# Patient Record
Sex: Female | Born: 1950 | Race: White | Hispanic: No | Smoking: Former smoker
Health system: Southern US, Community
[De-identification: ages and names within clinical notes are randomized; demographics above are authoritative.]

## PROBLEM LIST (undated history)

## (undated) DIAGNOSIS — F17200 Nicotine dependence, unspecified, uncomplicated: Secondary | ICD-10-CM

## (undated) DIAGNOSIS — F410 Panic disorder [episodic paroxysmal anxiety] without agoraphobia: Secondary | ICD-10-CM

## (undated) DIAGNOSIS — Z923 Personal history of irradiation: Secondary | ICD-10-CM

## (undated) DIAGNOSIS — Z9181 History of falling: Secondary | ICD-10-CM

## (undated) DIAGNOSIS — I10 Essential (primary) hypertension: Secondary | ICD-10-CM

## (undated) DIAGNOSIS — F329 Major depressive disorder, single episode, unspecified: Secondary | ICD-10-CM

## (undated) DIAGNOSIS — F32A Depression, unspecified: Secondary | ICD-10-CM

## (undated) DIAGNOSIS — C3491 Malignant neoplasm of unspecified part of right bronchus or lung: Secondary | ICD-10-CM

## (undated) DIAGNOSIS — F1010 Alcohol abuse, uncomplicated: Secondary | ICD-10-CM

## (undated) HISTORY — PX: TONSILLECTOMY: SUR1361

## (undated) HISTORY — PX: BACK SURGERY: SHX140

## (undated) HISTORY — DX: Malignant neoplasm of unspecified part of right bronchus or lung: C34.91

---

## 2011-05-06 ENCOUNTER — Other Ambulatory Visit: Payer: Self-pay | Admitting: Family Medicine

## 2011-05-17 ENCOUNTER — Other Ambulatory Visit: Payer: Self-pay

## 2011-08-06 ENCOUNTER — Other Ambulatory Visit: Payer: Self-pay | Admitting: Family Medicine

## 2011-08-09 ENCOUNTER — Ambulatory Visit
Admission: RE | Admit: 2011-08-09 | Discharge: 2011-08-09 | Disposition: A | Discharge status: Home / Self Care | Payer: BC Managed Care – PPO | Source: Ambulatory Visit | Attending: Family Medicine | Admitting: Family Medicine

## 2013-06-21 ENCOUNTER — Inpatient Hospital Stay (HOSPITAL_COMMUNITY)
Admission: EM | Admit: 2013-06-21 | Discharge: 2013-06-29 | DRG: 480 | Disposition: A | Discharge status: Rehabilitation Facility | Attending: Internal Medicine | Admitting: Internal Medicine

## 2013-06-21 ENCOUNTER — Emergency Department (HOSPITAL_COMMUNITY)

## 2013-06-21 ENCOUNTER — Encounter (HOSPITAL_COMMUNITY): Payer: Self-pay | Admitting: Emergency Medicine

## 2013-06-21 ENCOUNTER — Inpatient Hospital Stay (HOSPITAL_COMMUNITY)

## 2013-06-21 DIAGNOSIS — F17200 Nicotine dependence, unspecified, uncomplicated: Secondary | ICD-10-CM | POA: Diagnosis present

## 2013-06-21 DIAGNOSIS — J4489 Other specified chronic obstructive pulmonary disease: Secondary | ICD-10-CM | POA: Diagnosis present

## 2013-06-21 DIAGNOSIS — I4892 Unspecified atrial flutter: Secondary | ICD-10-CM | POA: Diagnosis present

## 2013-06-21 DIAGNOSIS — M810 Age-related osteoporosis without current pathological fracture: Secondary | ICD-10-CM | POA: Diagnosis present

## 2013-06-21 DIAGNOSIS — R222 Localized swelling, mass and lump, trunk: Secondary | ICD-10-CM | POA: Diagnosis present

## 2013-06-21 DIAGNOSIS — F1010 Alcohol abuse, uncomplicated: Secondary | ICD-10-CM | POA: Diagnosis present

## 2013-06-21 DIAGNOSIS — Z7982 Long term (current) use of aspirin: Secondary | ICD-10-CM | POA: Diagnosis not present

## 2013-06-21 DIAGNOSIS — E46 Unspecified protein-calorie malnutrition: Secondary | ICD-10-CM | POA: Diagnosis present

## 2013-06-21 DIAGNOSIS — G9340 Encephalopathy, unspecified: Secondary | ICD-10-CM | POA: Diagnosis present

## 2013-06-21 DIAGNOSIS — S72141A Displaced intertrochanteric fracture of right femur, initial encounter for closed fracture: Secondary | ICD-10-CM | POA: Diagnosis present

## 2013-06-21 DIAGNOSIS — I480 Paroxysmal atrial fibrillation: Secondary | ICD-10-CM | POA: Diagnosis not present

## 2013-06-21 DIAGNOSIS — F411 Generalized anxiety disorder: Secondary | ICD-10-CM | POA: Diagnosis present

## 2013-06-21 DIAGNOSIS — S72001A Fracture of unspecified part of neck of right femur, initial encounter for closed fracture: Secondary | ICD-10-CM

## 2013-06-21 DIAGNOSIS — C3491 Malignant neoplasm of unspecified part of right bronchus or lung: Secondary | ICD-10-CM | POA: Diagnosis present

## 2013-06-21 DIAGNOSIS — S72143A Displaced intertrochanteric fracture of unspecified femur, initial encounter for closed fracture: Secondary | ICD-10-CM

## 2013-06-21 DIAGNOSIS — I4891 Unspecified atrial fibrillation: Secondary | ICD-10-CM | POA: Diagnosis present

## 2013-06-21 DIAGNOSIS — I10 Essential (primary) hypertension: Secondary | ICD-10-CM | POA: Diagnosis present

## 2013-06-21 DIAGNOSIS — Z87891 Personal history of nicotine dependence: Secondary | ICD-10-CM | POA: Insufficient documentation

## 2013-06-21 DIAGNOSIS — Z79899 Other long term (current) drug therapy: Secondary | ICD-10-CM

## 2013-06-21 DIAGNOSIS — R918 Other nonspecific abnormal finding of lung field: Secondary | ICD-10-CM

## 2013-06-21 DIAGNOSIS — S72009A Fracture of unspecified part of neck of unspecified femur, initial encounter for closed fracture: Secondary | ICD-10-CM

## 2013-06-21 DIAGNOSIS — N179 Acute kidney failure, unspecified: Secondary | ICD-10-CM | POA: Diagnosis present

## 2013-06-21 DIAGNOSIS — J449 Chronic obstructive pulmonary disease, unspecified: Secondary | ICD-10-CM | POA: Diagnosis present

## 2013-06-21 DIAGNOSIS — R599 Enlarged lymph nodes, unspecified: Secondary | ICD-10-CM | POA: Diagnosis present

## 2013-06-21 DIAGNOSIS — R590 Localized enlarged lymph nodes: Secondary | ICD-10-CM

## 2013-06-21 DIAGNOSIS — Z5189 Encounter for other specified aftercare: Secondary | ICD-10-CM | POA: Diagnosis not present

## 2013-06-21 DIAGNOSIS — D649 Anemia, unspecified: Secondary | ICD-10-CM | POA: Diagnosis present

## 2013-06-21 DIAGNOSIS — W19XXXA Unspecified fall, initial encounter: Secondary | ICD-10-CM | POA: Diagnosis present

## 2013-06-21 DIAGNOSIS — F319 Bipolar disorder, unspecified: Secondary | ICD-10-CM

## 2013-06-21 DIAGNOSIS — C3492 Malignant neoplasm of unspecified part of left bronchus or lung: Secondary | ICD-10-CM

## 2013-06-21 HISTORY — DX: Panic disorder (episodic paroxysmal anxiety): F41.0

## 2013-06-21 HISTORY — DX: Major depressive disorder, single episode, unspecified: F32.9

## 2013-06-21 HISTORY — DX: Nicotine dependence, unspecified, uncomplicated: F17.200

## 2013-06-21 HISTORY — DX: Alcohol abuse, uncomplicated: F10.10

## 2013-06-21 HISTORY — DX: Depression, unspecified: F32.A

## 2013-06-21 HISTORY — DX: Essential (primary) hypertension: I10

## 2013-06-21 LAB — CBC WITH DIFFERENTIAL/PLATELET
BASOS ABS: 0 10*3/uL (ref 0.0–0.1)
BASOS PCT: 0 % (ref 0–1)
Eosinophils Absolute: 0 10*3/uL (ref 0.0–0.7)
Eosinophils Relative: 0 % (ref 0–5)
HCT: 34.4 % — ABNORMAL LOW (ref 36.0–46.0)
Hemoglobin: 11.7 g/dL — ABNORMAL LOW (ref 12.0–15.0)
LYMPHS PCT: 13 % (ref 12–46)
Lymphs Abs: 1.9 10*3/uL (ref 0.7–4.0)
MCH: 33.6 pg (ref 26.0–34.0)
MCHC: 34 g/dL (ref 30.0–36.0)
MCV: 98.9 fL (ref 78.0–100.0)
MONO ABS: 1.7 10*3/uL — AB (ref 0.1–1.0)
Monocytes Relative: 12 % (ref 3–12)
NEUTROS ABS: 11 10*3/uL — AB (ref 1.7–7.7)
NEUTROS PCT: 75 % (ref 43–77)
Platelets: 310 10*3/uL (ref 150–400)
RBC: 3.48 MIL/uL — ABNORMAL LOW (ref 3.87–5.11)
RDW: 13.8 % (ref 11.5–15.5)
WBC: 14.7 10*3/uL — AB (ref 4.0–10.5)

## 2013-06-21 LAB — URINALYSIS, ROUTINE W REFLEX MICROSCOPIC
GLUCOSE, UA: NEGATIVE mg/dL
Hgb urine dipstick: NEGATIVE
Ketones, ur: 15 mg/dL — AB
Leukocytes, UA: NEGATIVE
Nitrite: NEGATIVE
Protein, ur: NEGATIVE mg/dL
Specific Gravity, Urine: 1.021 (ref 1.005–1.030)
Urobilinogen, UA: 1 mg/dL (ref 0.0–1.0)
pH: 5.5 (ref 5.0–8.0)

## 2013-06-21 LAB — BASIC METABOLIC PANEL
BUN: 36 mg/dL — ABNORMAL HIGH (ref 6–23)
CALCIUM: 9.6 mg/dL (ref 8.4–10.5)
CO2: 31 mEq/L (ref 19–32)
Chloride: 90 mEq/L — ABNORMAL LOW (ref 96–112)
Creatinine, Ser: 0.63 mg/dL (ref 0.50–1.10)
GFR calc non Af Amer: >90 mL/min (ref >90)
Glucose, Bld: 101 mg/dL — ABNORMAL HIGH (ref 70–99)
Potassium: 3.8 mEq/L (ref 3.7–5.3)
Sodium: 135 mEq/L — ABNORMAL LOW (ref 137–147)

## 2013-06-21 LAB — PROTIME-INR
INR: 0.93 (ref 0.00–1.49)
PROTHROMBIN TIME: 12.3 s (ref 11.6–15.2)

## 2013-06-21 LAB — TYPE AND SCREEN
ABO/RH(D): B POS
Antibody Screen: NEGATIVE

## 2013-06-21 LAB — ABO/RH: ABO/RH(D): B POS

## 2013-06-21 MED ORDER — DEXTROSE-NACL 5-0.45 % IV SOLN
100.0000 mL/h | INTRAVENOUS | Status: DC
  Administered 2013-06-21: 100 mL/h via INTRAVENOUS

## 2013-06-21 MED ORDER — ALPRAZOLAM 0.5 MG PO TABS: 0.5000 mg | ORAL_TABLET | Freq: Two times a day (BID) | ORAL | Status: DC | PRN

## 2013-06-21 MED ORDER — LORAZEPAM 2 MG/ML IJ SOLN
1.0000 mg | Freq: Four times a day (QID) | INTRAMUSCULAR | Status: AC | PRN
  Administered 2013-06-22 – 2013-06-23 (×2): 1 mg via INTRAVENOUS
  Filled 2013-06-21 (×2): qty 1

## 2013-06-21 MED ORDER — HYDROCODONE-ACETAMINOPHEN 5-325 MG PO TABS
1.0000 | ORAL_TABLET | Freq: Four times a day (QID) | ORAL | Status: DC | PRN
  Administered 2013-06-21 – 2013-06-26 (×7): 2 via ORAL
  Administered 2013-06-26 – 2013-06-27 (×3): 1 via ORAL
  Administered 2013-06-27 – 2013-06-28 (×2): 2 via ORAL
  Administered 2013-06-28: 1 via ORAL
  Administered 2013-06-29: 2 via ORAL
  Filled 2013-06-21 (×5): qty 2
  Filled 2013-06-21: qty 1
  Filled 2013-06-21 (×2): qty 2
  Filled 2013-06-21: qty 1
  Filled 2013-06-21: qty 2
  Filled 2013-06-21: qty 1
  Filled 2013-06-21 (×3): qty 2

## 2013-06-21 MED ORDER — LORAZEPAM 1 MG PO TABS: 1.0000 mg | ORAL_TABLET | Freq: Four times a day (QID) | ORAL | Status: AC | PRN

## 2013-06-21 MED ORDER — ACETAMINOPHEN 500 MG PO TABS: 1000.0000 mg | ORAL_TABLET | Freq: Once | ORAL | Status: DC

## 2013-06-21 MED ORDER — MORPHINE SULFATE 4 MG/ML IJ SOLN
4.0000 mg | Freq: Once | INTRAMUSCULAR | Status: AC
  Administered 2013-06-21: 4 mg via INTRAVENOUS
  Filled 2013-06-21: qty 1

## 2013-06-21 MED ORDER — SODIUM CHLORIDE 0.9 % IV SOLN
Freq: Once | INTRAVENOUS | Status: AC
  Administered 2013-06-21: 15:00:00 via INTRAVENOUS

## 2013-06-21 MED ORDER — THIAMINE HCL 100 MG/ML IJ SOLN
100.0000 mg | Freq: Every day | INTRAMUSCULAR | Status: DC
  Administered 2013-06-23: 100 mg via INTRAVENOUS
  Filled 2013-06-21 (×4): qty 1

## 2013-06-21 MED ORDER — SERTRALINE HCL 100 MG PO TABS
100.0000 mg | ORAL_TABLET | Freq: Every day | ORAL | Status: DC
  Administered 2013-06-23 – 2013-06-29 (×7): 100 mg via ORAL
  Filled 2013-06-21 (×8): qty 1

## 2013-06-21 MED ORDER — MORPHINE SULFATE 2 MG/ML IJ SOLN
0.5000 mg | INTRAMUSCULAR | Status: DC | PRN
  Administered 2013-06-22: 0.5 mg via INTRAVENOUS
  Filled 2013-06-21: qty 1

## 2013-06-21 MED ORDER — FOLIC ACID 1 MG PO TABS
1.0000 mg | ORAL_TABLET | Freq: Every day | ORAL | Status: DC
  Administered 2013-06-22 – 2013-06-29 (×8): 1 mg via ORAL
  Filled 2013-06-21 (×8): qty 1

## 2013-06-21 MED ORDER — CHLORHEXIDINE GLUCONATE 4 % EX LIQD
60.0000 mL | Freq: Once | CUTANEOUS | Status: DC
  Filled 2013-06-21: qty 60

## 2013-06-21 MED ORDER — ADULT MULTIVITAMIN W/MINERALS CH
1.0000 | ORAL_TABLET | Freq: Every day | ORAL | Status: DC
  Administered 2013-06-22 – 2013-06-29 (×8): 1 via ORAL
  Filled 2013-06-21 (×8): qty 1

## 2013-06-21 MED ORDER — ADULT MULTIVITAMIN W/MINERALS CH: 1.0000 | ORAL_TABLET | Freq: Every day | ORAL | Status: DC

## 2013-06-21 MED ORDER — ENOXAPARIN SODIUM 40 MG/0.4ML ~~LOC~~ SOLN
40.0000 mg | SUBCUTANEOUS | Status: DC
  Administered 2013-06-22 – 2013-06-26 (×5): 40 mg via SUBCUTANEOUS
  Filled 2013-06-21 (×7): qty 0.4

## 2013-06-21 MED ORDER — VITAMIN B-1 100 MG PO TABS
100.0000 mg | ORAL_TABLET | Freq: Every day | ORAL | Status: DC
  Administered 2013-06-22 – 2013-06-29 (×7): 100 mg via ORAL
  Filled 2013-06-21 (×8): qty 1

## 2013-06-21 MED ORDER — CLINDAMYCIN PHOSPHATE 900 MG/50ML IV SOLN
900.0000 mg | INTRAVENOUS | Status: DC
  Filled 2013-06-21: qty 50

## 2013-06-21 MED ORDER — METOPROLOL SUCCINATE ER 100 MG PO TB24
100.0000 mg | ORAL_TABLET | Freq: Every day | ORAL | Status: DC
  Filled 2013-06-21 (×3): qty 1

## 2013-06-21 NOTE — Consult Note (Signed)
   ORTHOPAEDIC CONSULTATION  REQUESTING PHYSICIAN: Jai-Gurmukh Samtani, MD  Chief Complaint: R hip pain  HPI: Stephanie Oliver is a 63 y.o. female who complains of  Mechanical fall onto her R side  Past Medical History  Diagnosis Date  . Depression   . Panic attacks   . Hypertension   . Smoker   . Alcohol abuse    History reviewed. No pertinent past surgical history. History   Social History  . Marital Status: Widowed    Spouse Name: N/A    Number of Children: N/A  . Years of Education: N/A   Social History Main Topics  . Smoking status: Current Every Day Smoker -- 1.50 packs/day    Types: Cigarettes  . Smokeless tobacco: None  . Alcohol Use: Yes  . Drug Use: No  . Sexual Activity: None   Other Topics Concern  . None   Social History Narrative   Heavy smoker till 06/17/13, smoked from age 18 32642 packs per day   Current moderate to heavy drinker, drinks 3-4 beers daily, if she cannot get the beer she drinks 3-4 shots of whiskey daily.                                           Lives currently with her daughter and 13-month-old granddaughter at home   Next of kin = [name] phone number [phone]   Used to work until the age of 61 and then went into early retirement   Previously has had suicidal attempt   Has tried to pace herself in inpatient alcohol detox when she slipped in [place].   Family History  Problem Relation Age of Onset  . CAD Father   . Arthritis Mother    Allergies  Allergen Reactions  . Lisinopril Diarrhea and Nausea Only  . Paxil [Paroxetine] Diarrhea and Nausea Only  . Penicillins Hives   Prior to Admission medications   Medication Sig Start Date End Date Taking? Authorizing Provider  ALPRAZolam (XANAX) 0.5 MG tablet Take 0.5 mg by mouth 2 (two) times daily as needed for anxiety.  06/14/13  Yes Historical Provider, MD  aspirin 325 MG tablet Take 325 mg by mouth daily.   Yes Historical Provider, MD  Cholecalciferol (VITAMIN D-3 PO) Take 1 tablet  by mouth daily.   Yes Historical Provider, MD  metoprolol succinate (TOPROL-XL) 100 MG 24 hr tablet Take 100 mg by mouth daily. 05/04/13  Yes Historical Provider, MD  Multiple Vitamin (MULTIVITAMIN WITH MINERALS) TABS tablet Take 1 tablet by mouth daily.   Yes Historical Provider, MD  naproxen sodium (ANAPROX) 220 MG tablet Take 220 mg by mouth 2 (two) times daily with a meal.   Yes Historical Provider, MD  sertraline (ZOLOFT) 100 MG tablet Take 100 mg by mouth daily. 05/04/13  Yes Historical Provider, MD   Dg Hip Complete Right  06/21/2013   CLINICAL DATA:  Right hip pain secondary to a fall 2 days ago.  EXAM: RIGHT HIP - COMPLETE 2+ VIEW  COMPARISON:  None.  FINDINGS: There is an angulated comminuted slightly impacted intertrochanteric fracture of the proximal right femur. Lesser trochanter is displaced. 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  Pelvic bones are intact.  IMPRESSION: Comminuted intertrochanteric fracture of the proximal right femur.   Electronically Signed   By: Jim  Maxwell M.D.   On: 06/21/2013 11:26     Ct Head Wo Contrast  06/21/2013   CLINICAL DATA:  History of trauma from a fall. Right leg pain and weakness.  EXAM: CT HEAD WITHOUT CONTRAST  TECHNIQUE: Contiguous axial images were obtained from the base of the skull through the vertex without intravenous contrast.  COMPARISON:  No priors.  FINDINGS: Patchy and confluent areas of decreased attenuation are noted throughout the deep and periventricular white matter of the cerebral hemispheres bilaterally, compatible with chronic microvascular ischemic disease. No acute displaced skull fractures are identified. No acute intracranial abnormality. Specifically, no evidence of acute post-traumatic intracranial hemorrhage, no definite regions of acute/subacute cerebral ischemia, no focal mass, mass effect, hydrocephalus or abnormal intra or extra-axial fluid collections. The visualized paranasal sinuses and mastoids are well  pneumatized.  IMPRESSION: 1. No acute displaced skull fracture or findings to suggest acute intracranial trauma. 2. Chronic microvascular ischemic changes in the cerebral white matter, as above.   Electronically Signed   By: Daniel  Entrikin M.D.   On: 06/21/2013 12:11   Dg Chest Port 1 View  06/21/2013   CLINICAL DATA:  Preop for hip fracture.  Slight cough.  EXAM: PORTABLE CHEST - 1 VIEW  COMPARISON:  None.  FINDINGS: The heart size is normal. A right peritracheal mass measures 5.3 x 7.0 cm. Other smaller right-sided nodules are evident. A 2 cm left pulmonary nodule is suggested. There is no edema or effusion to suggest failure. The right hemidiaphragm is elevated.  No acute focal airspace disease is evident.  IMPRESSION: 1. 7 cm right peritracheal mass. Recommend CT of the chest with contrast for further evaluation. 2. Additional smaller nodules are suggested, including a 2 cm left lower lobe nodule.   Electronically Signed   By: Chris  Mattern M.D.   On: 06/21/2013 15:10    Positive ROS: All other systems have been reviewed and were otherwise negative with the exception of those mentioned in the HPI and as above.  Labs cbc  Recent Labs  06/21/13 1136  WBC 14.7*  HGB 11.7*  HCT 34.4*  PLT 310    Labs inflam No results found for this basename: ESR, CRP,  in the last 72 hours  Labs coag  Recent Labs  06/21/13 1136  INR 0.93     Recent Labs  06/21/13 1136  NA 135*  K 3.8  CL 90*  CO2 31  GLUCOSE 101*  BUN 36*  CREATININE 0.63  CALCIUM 9.6    Physical Exam: Filed Vitals:   06/21/13 1345  BP: 104/65  Pulse: 77  Temp:   Resp:    General: Alert, no acute distress Cardiovascular: No pedal edema Respiratory: No cyanosis, no use of accessory musculature GI: No organomegaly, abdomen is soft and non-tender Skin: No lesions in the area of chief complaint Neurologic: Sensation intact distally Psychiatric: Patient is competent for consent with normal mood and  affect Lymphatic: No axillary or cervical lymphadenopathy  MUSCULOSKELETAL:  Pain with log roll, skin benign, distally NVI Other extremities are atraumatic with painless ROM and NVI.  Assessment: Right intertroch hip fracture  Plan: IM nail in OR on 1/6 pending clearance by Hospitalist service Weight Bearing Status: Bedrest for now, will adjust likely to WBAT post op PT VTE px: SCD's and [employer] today, will restart aspirin post op  New finding of a lung mass on Xray and CT pulmonary involved. Have discussed with Anesthesia and we feel that the benefits of surgery outweigh the risk as does the family and patient   Blakleigh Straw,   Anner Baity, D, MD Cell [phone]   06/21/2013 3:40 PM     

## 2013-06-21 NOTE — ED Provider Notes (Signed)
CSN: [id number]     Arrival date & time 06/21/13  1010 History   First MD Initiated Contact with Patient 06/21/13 1111     Chief Complaint  Patient presents with  . Leg Pain  . Leg Swelling   (Consider location/radiation/quality/duration/timing/severity/associated sxs/prior Treatment) Patient is a 63 y.o. female presenting with leg pain.  Leg Pain Location:  Hip Time since incident:  2 days Injury: yes   Mechanism of injury: fall   Fall:    Fall occurred: pulled down when her dog jerked the leash. Pain details:    Quality:  Sharp   Severity:  Moderate   Onset quality:  Sudden   Timing:  Constant   Subjective pain progression: worse with movement.  unable to bear weight. Worsened by:  Bearing weight Associated symptoms: no fever   Associated symptoms comment:  Right leg swelling    Past Medical History  Diagnosis Date  . Depression   . Panic attacks    History reviewed. No pertinent past surgical history. No family history on file. History  Substance Use Topics  . Smoking status: Current Every Day Smoker -- 1.50 packs/day    Types: Cigarettes  . Smokeless tobacco: Not on file  . Alcohol Use: Yes   OB History   Grav Para Term Preterm Abortions TAB SAB Ect Mult Living                 Review of Systems  Constitutional: Negative for fever.  HENT: Negative for congestion.   Respiratory: Negative for cough and shortness of breath.   Cardiovascular: Negative for chest pain.  Gastrointestinal: Negative for nausea, vomiting, abdominal pain and diarrhea.  All other systems reviewed and are negative.    Allergies  Penicillins  Home Medications   Current Outpatient Rx  Name  Route  Sig  Dispense  Refill  . ALPRAZolam (XANAX) 0.5 MG tablet   Oral   Take 0.5 mg by mouth 2 (two) times daily.         . metoprolol succinate (TOPROL-XL) 100 MG 24 hr tablet   Oral   Take 100 mg by mouth daily.         . sertraline (ZOLOFT) 100 MG tablet   Oral   Take 100 mg by  mouth daily.          BP 115/55  Pulse 80  Temp(Src) 97.5 F (36.4 C) (Oral)  Resp 16  SpO2 99% Physical Exam  Nursing note and vitals reviewed. Constitutional: She is oriented to person, place, and time. She appears well-developed and well-nourished. No distress.  HENT:  Head: Normocephalic and atraumatic.  Mouth/Throat: Oropharynx is clear and moist.  Eyes: Conjunctivae are normal. Pupils are equal, round, and reactive to light. No scleral icterus.  Neck: Neck supple.  Cardiovascular: Normal rate, regular rhythm, normal heart sounds and intact distal pulses.   No murmur heard. Pulmonary/Chest: Effort normal and breath sounds normal. No stridor. No respiratory distress. She has no rales.  Abdominal: Soft. Bowel sounds are normal. She exhibits no distension. There is no tenderness.  Musculoskeletal: Normal range of motion.  Neurological: She is alert and oriented to person, place, and time.  Skin: Skin is warm and dry. No rash noted.  Psychiatric: She has a normal mood and affect. Her behavior is normal.    ED Course  Procedures (including critical care time) Labs Review Labs Reviewed  BASIC METABOLIC PANEL - Abnormal; Notable for the following:    Sodium 135 (*)  Chloride 90 (*)    Glucose, Bld 101 (*)    BUN 36 (*)    All other components within normal limits  CBC WITH DIFFERENTIAL - Abnormal; Notable for the following:    WBC 14.7 (*)    RBC 3.48 (*)    Hemoglobin 11.7 (*)    HCT 34.4 (*)    Neutro Abs 11.0 (*)    Monocytes Absolute 1.7 (*)    All other components within normal limits  URINALYSIS, ROUTINE W REFLEX MICROSCOPIC - Abnormal; Notable for the following:    Bilirubin Urine SMALL (*)    Ketones, ur 15 (*)    All other components within normal limits  PROTIME-INR  TYPE AND SCREEN  ABO/RH   Imaging Review Dg Hip Complete Right  06/21/2013   CLINICAL DATA:  Right hip pain secondary to a fall 2 days ago.  EXAM: RIGHT HIP - COMPLETE 2+ VIEW   COMPARISON:  None.  FINDINGS: There is an angulated comminuted slightly impacted intertrochanteric fracture of the proximal right femur. Lesser trochanter is displaced. 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  Pelvic bones are intact.  IMPRESSION: Comminuted intertrochanteric fracture of the proximal right femur.   Electronically Signed   By: Rozetta Nunnery M.D.   On: 06/21/2013 11:26  Above radiology studies independently viewed by me.     EKG Interpretation   None       MDM   1. Intertrochanteric fracture of right hip   2. Lung mass    Right intertrochanteric hip fracture after a fall two days ago.  Dr. Verlon Au will admit.  Dr. Percell Miller (Orthopedics) plans OR possibly tomorrow.  During workup, a paratracheal mass was detected.  This was communicated to patient and CT scan was ordered, which Dr. Verlon Au will follow up.    She initially declined pain meds, but was subsequently treated with IV morphine.      Houston Siren, MD 06/21/13 [phone]

## 2013-06-21 NOTE — ED Notes (Signed)
Patient states was walking dog and the "dog took off and I fell behind him".  Patient states fell on R side.  Patient states 2 x days ago and has had increasing pain and swelling in R leg.    Patient unable to walk or put any weight on the leg at this time.

## 2013-06-21 NOTE — H&P (Signed)
[facility] History and Physical  Timarie Labell XLK:[id number] DOB: 07-22-1950 DOA: 06/21/2013  Referring physician: Doy Mince PCP: [name], MD  Specialists:  Bosie Clos will see patient in am-Will Operate 06/22/12  Chief Complaint: Right intertrochanteric hip fracture  HPI: Stephanie Oliver is a 63 y.o. female came to [facility] ed 06/21/2013 with   Fall/06/18/2013. She was in her yard walking her 2 dogs and one of the dogs sauce well. The dog ran away she did not have a good hold on the leash she fell backwards and hit her right hip posterolaterally. She states that she crawled back inside the home and could not bear weight on that limb subsequent to the fall. She has been nonambulatory and was able to get into her bed the night of the fall but has not been able to ambulate since. She has not been able to really go to the bathroom to urinate either. She states that although she's fallen before, she never experiences amount of pain with fall. Because of her poor ambulatory status and significant pain as well as inability to bear weight, she presented to the emergency room. EDP reports that patient seemed to be hallucinating before last but has not been drinking her usual amount of alcohol recently given  Emergency room workup = CT head no skull fracture, chronic microvascular disease UA = ketones 15, bilirubin small negative nitrites leukocytes Typed and screened Sodium 135, chloride of 90, BUN/creatinine 36/0.63 (no baseline) BC 14.7, hemoglobin 11.7, hematocrit 34.4, INR 0.93  EKG  = sinus rhythm, PR interval 0.12/0.16 borderline first degree AV block axis of QRS = 90, appropriate transition V4 no ST T-wave changes per se borderline criterion LVH? Wondering baseline. QT = 429  Chest x-ray not performed as yet  Review of Systems: The patient denies fever chills nausea vomiting chest pain blurred vision double vision Diarrhea recent illness Has had a cough for the past month month and a  half no fever no sick contacts No rash no precipitating event.   Past Medical History  Diagnosis Date  . Depression   . Panic attacks    History reviewed. No pertinent past surgical history. Social History:  History   Social History Narrative  . No narrative on file    Allergies  Allergen Reactions  . Lisinopril Diarrhea and Nausea Only  . Paxil [Paroxetine] Diarrhea and Nausea Only  . Penicillins Hives    Family History  Problem Relation Age of Onset  . CAD Father   . Arthritis Mother     Prior to Admission medications   Medication Sig Start Date End Date Taking? Authorizing Provider  ALPRAZolam Duanne Moron) 0.5 MG tablet Take 0.5 mg by mouth 2 (two) times daily as needed for anxiety.  06/14/13  Yes Historical Provider, MD  aspirin 325 MG tablet Take 325 mg by mouth daily.   Yes Historical Provider, MD  Cholecalciferol (VITAMIN D-3 PO) Take 1 tablet by mouth daily.   Yes Historical Provider, MD  metoprolol succinate (TOPROL-XL) 100 MG 24 hr tablet Take 100 mg by mouth daily. 05/04/13  Yes Historical Provider, MD  Multiple Vitamin (MULTIVITAMIN WITH MINERALS) TABS tablet Take 1 tablet by mouth daily.   Yes Historical Provider, MD  naproxen sodium (ANAPROX) 220 MG tablet Take 220 mg by mouth 2 (two) times daily with a meal.   Yes Historical Provider, MD  sertraline (ZOLOFT) 100 MG tablet Take 100 mg by mouth daily. 05/04/13  Yes Historical Provider, MD   Physical Exam: Filed Vitals:  06/21/13 1300 06/21/13 1315 06/21/13 1330 06/21/13 1345  BP: 128/78 137/72 115/71 104/65  Pulse: 75 77 76 77  Temp:      TempSrc:      Resp:      SpO2: 96% 98% 99% 97%     General:  EOMI, frail  Eyes: See above  ENT: Soft supple no JVD  Neck: No region no thyromegaly  Cardiovascular: S1-S2 regular rate rhythm  Respiratory: Clinically clear  Abdomen: Soft nontender nondistended  Skin: Ecchymotic area right upper thigh,  Musculoskeletal: Right leg externally rotated painful to  passively maneuver  Psychiatric: Euthymic  Neurologic: 5/5 power upper extremities right lower 70 exam deferred sensory intact however  Labs on Admission:  Basic Metabolic Panel:  Recent Labs Lab 06/21/13 1136  NA 135*  K 3.8  CL 90*  CO2 31  GLUCOSE 101*  BUN 36*  CREATININE 0.63  CALCIUM 9.6   Liver Function Tests: No results found for this basename: AST, ALT, ALKPHOS, BILITOT, PROT, ALBUMIN,  in the last 168 hours No results found for this basename: LIPASE, AMYLASE,  in the last 168 hours No results found for this basename: AMMONIA,  in the last 168 hours CBC:  Recent Labs Lab 06/21/13 1136  WBC 14.7*  NEUTROABS 11.0*  HGB 11.7*  HCT 34.4*  MCV 98.9  PLT 310   Cardiac Enzymes: No results found for this basename: CKTOTAL, CKMB, CKMBINDEX, TROPONINI,  in the last 168 hours  BNP (last 3 results) No results found for this basename: PROBNP,  in the last 8760 hours CBG: No results found for this basename: GLUCAP,  in the last 168 hours  Radiological Exams on Admission: Dg Hip Complete Right  06/21/2013   CLINICAL DATA:  Right hip pain secondary to a fall 2 days ago.  EXAM: RIGHT HIP - COMPLETE 2+ VIEW  COMPARISON:  None.  FINDINGS: There is an angulated comminuted slightly impacted intertrochanteric fracture of the proximal right femur. Lesser trochanter is displaced. 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  Pelvic bones are intact.  IMPRESSION: Comminuted intertrochanteric fracture of the proximal right femur.   Electronically Signed   By: Rozetta Nunnery M.D.   On: 06/21/2013 11:26   Ct Head Wo Contrast  06/21/2013   CLINICAL DATA:  History of trauma from a fall. Right leg pain and weakness.  EXAM: CT HEAD WITHOUT CONTRAST  TECHNIQUE: Contiguous axial images were obtained from the base of the skull through the vertex without intravenous contrast.  COMPARISON:  No priors.  FINDINGS: Patchy and confluent areas of decreased attenuation are noted  throughout the deep and periventricular white matter of the cerebral hemispheres bilaterally, compatible with chronic microvascular ischemic disease. No acute displaced skull fractures are identified. No acute intracranial abnormality. Specifically, no evidence of acute post-traumatic intracranial hemorrhage, no definite regions of acute/subacute cerebral ischemia, no focal mass, mass effect, hydrocephalus or abnormal intra or extra-axial fluid collections. The visualized paranasal sinuses and mastoids are well pneumatized.  IMPRESSION: 1. No acute displaced skull fracture or findings to suggest acute intracranial trauma. 2. Chronic microvascular ischemic changes in the cerebral white matter, as above.   Electronically Signed   By: Vinnie Langton M.D.   On: 06/21/2013 12:11    EKG: Independently reviewed. See above  Assessment/Plan Active Problems:   Comminuted Intertrochanteric fracture of right hip-Dr. Percell Miller has already been consulted regarding surgery. Will get chest x-ray. From my standpoint based on preoperative criteria and guidelines, is cleared for  surgery unless chest x-ray shows differently.   EKG normal and orthopedics surgery is moderate risk. This will be explained to patient by orthopedic surgeon.  I would hold aspirin in her case or at least drop the dose to 81 mg to prevent perioperative bleeding.  Pain management, perioperative DT prophylaxis/treatment, threshold to transfuse as per orthopedic surgeon   Acute kidney injury-likely in not being able to get much water and fluids. Instructed to drink as much as possible today, regular diet, n.p.o. after midnight, IV saline 70 cc per hour   Alcohol abuse, continuous-placed on CIWA protocol for detox.  Continue prior to admission Xanax in addition to Ativan    Former smoker-get chest x-ray    Hypertension-continue beta blocker, some evidence suggest perioperative benefit if given 2 weeks prior to surgery    Osteoporosis-after surgery  consider starting vitamin D 50,000 units and checking vitamin D to level in about 4 weeks    Bipolar 1 disorder-continue sertraline 100 mg daily, Xanax 0.5 mg daily when necessary    Time spent:  [place], [facility] Pager [phone]  If 7PM-7AM, please contact night-coverage [email or web address] Password [email or web address] 06/21/2013, 2:06 PM

## 2013-06-22 ENCOUNTER — Inpatient Hospital Stay (HOSPITAL_COMMUNITY): Admitting: Certified Registered"

## 2013-06-22 ENCOUNTER — Encounter (HOSPITAL_COMMUNITY)
Admission: EM | Disposition: A | Discharge status: Rehabilitation Facility | Payer: Self-pay | Source: Home / Self Care | Attending: Internal Medicine

## 2013-06-22 ENCOUNTER — Encounter (HOSPITAL_COMMUNITY): Admitting: Certified Registered"

## 2013-06-22 ENCOUNTER — Inpatient Hospital Stay (HOSPITAL_COMMUNITY)
Admission: RE | Admit: 2013-06-22 | Payer: No Typology Code available for payment source | Source: Ambulatory Visit | Admitting: Orthopedic Surgery

## 2013-06-22 ENCOUNTER — Inpatient Hospital Stay (HOSPITAL_COMMUNITY)

## 2013-06-22 ENCOUNTER — Encounter (HOSPITAL_COMMUNITY): Payer: Self-pay | Admitting: Certified Registered Nurse Anesthetist

## 2013-06-22 DIAGNOSIS — F319 Bipolar disorder, unspecified: Secondary | ICD-10-CM

## 2013-06-22 DIAGNOSIS — R222 Localized swelling, mass and lump, trunk: Secondary | ICD-10-CM

## 2013-06-22 DIAGNOSIS — M810 Age-related osteoporosis without current pathological fracture: Secondary | ICD-10-CM

## 2013-06-22 DIAGNOSIS — R599 Enlarged lymph nodes, unspecified: Secondary | ICD-10-CM

## 2013-06-22 DIAGNOSIS — J449 Chronic obstructive pulmonary disease, unspecified: Secondary | ICD-10-CM | POA: Diagnosis present

## 2013-06-22 DIAGNOSIS — F1010 Alcohol abuse, uncomplicated: Secondary | ICD-10-CM

## 2013-06-22 DIAGNOSIS — Z87891 Personal history of nicotine dependence: Secondary | ICD-10-CM

## 2013-06-22 DIAGNOSIS — F17200 Nicotine dependence, unspecified, uncomplicated: Secondary | ICD-10-CM | POA: Diagnosis present

## 2013-06-22 DIAGNOSIS — R590 Localized enlarged lymph nodes: Secondary | ICD-10-CM | POA: Diagnosis present

## 2013-06-22 HISTORY — PX: FEMUR IM NAIL: SHX1597

## 2013-06-22 HISTORY — PX: VIDEO BRONCHOSCOPY WITH ENDOBRONCHIAL ULTRASOUND: SHX6177

## 2013-06-22 LAB — BASIC METABOLIC PANEL
BUN: 24 mg/dL — ABNORMAL HIGH (ref 6–23)
CALCIUM: 8.7 mg/dL (ref 8.4–10.5)
CO2: 31 mEq/L (ref 19–32)
CREATININE: 0.5 mg/dL (ref 0.50–1.10)
Chloride: 92 mEq/L — ABNORMAL LOW (ref 96–112)
GFR calc Af Amer: >90 mL/min (ref >90)
Glucose, Bld: 138 mg/dL — ABNORMAL HIGH (ref 70–99)
Potassium: 3.2 mEq/L — ABNORMAL LOW (ref 3.7–5.3)
Sodium: 134 mEq/L — ABNORMAL LOW (ref 137–147)

## 2013-06-22 LAB — POTASSIUM: POTASSIUM: 3.9 meq/L (ref 3.7–5.3)

## 2013-06-22 LAB — CBC
HCT: 31 % — ABNORMAL LOW (ref 36.0–46.0)
Hemoglobin: 10.4 g/dL — ABNORMAL LOW (ref 12.0–15.0)
MCH: 34 pg (ref 26.0–34.0)
MCHC: 33.5 g/dL (ref 30.0–36.0)
MCV: 101.3 fL — AB (ref 78.0–100.0)
Platelets: 276 10*3/uL (ref 150–400)
RBC: 3.06 MIL/uL — ABNORMAL LOW (ref 3.87–5.11)
RDW: 14 % (ref 11.5–15.5)
WBC: 12 10*3/uL — ABNORMAL HIGH (ref 4.0–10.5)

## 2013-06-22 LAB — SURGICAL PCR SCREEN
MRSA, PCR: NEGATIVE
STAPHYLOCOCCUS AUREUS: POSITIVE — AB

## 2013-06-22 SURGERY — INSERTION, INTRAMEDULLARY ROD, FEMUR
Anesthesia: General | Site: Hip | Laterality: Right

## 2013-06-22 MED ORDER — OXYCODONE HCL 5 MG/5ML PO SOLN: 5.0000 mg | Freq: Once | ORAL | Status: DC | PRN

## 2013-06-22 MED ORDER — POTASSIUM CHLORIDE 10 MEQ/100ML IV SOLN
10.0000 meq | INTRAVENOUS | Status: AC
  Administered 2013-06-22 (×2): 10 meq via INTRAVENOUS
  Filled 2013-06-22 (×2): qty 100

## 2013-06-22 MED ORDER — LIDOCAINE HCL (CARDIAC) 20 MG/ML IV SOLN
INTRAVENOUS | Status: DC | PRN
  Administered 2013-06-22: 60 mg via INTRAVENOUS

## 2013-06-22 MED ORDER — HYDROCODONE-ACETAMINOPHEN 5-325 MG PO TABS: 2.0000 | ORAL_TABLET | ORAL | Status: DC | PRN

## 2013-06-22 MED ORDER — ONDANSETRON HCL 4 MG/2ML IJ SOLN: 4.0000 mg | Freq: Once | INTRAMUSCULAR | Status: DC | PRN

## 2013-06-22 MED ORDER — HYDROMORPHONE HCL PF 1 MG/ML IJ SOLN
0.2500 mg | INTRAMUSCULAR | Status: DC | PRN
  Administered 2013-06-22: 0.5 mg via INTRAVENOUS

## 2013-06-22 MED ORDER — ACETAMINOPHEN 650 MG RE SUPP: 650.0000 mg | Freq: Four times a day (QID) | RECTAL | Status: DC | PRN

## 2013-06-22 MED ORDER — PROPOFOL 10 MG/ML IV BOLUS
INTRAVENOUS | Status: DC | PRN
Start: 2013-06-22 — End: 2013-06-22
  Administered 2013-06-22: 150 mg via INTRAVENOUS

## 2013-06-22 MED ORDER — HYDROMORPHONE HCL PF 1 MG/ML IJ SOLN
INTRAMUSCULAR | Status: AC
  Filled 2013-06-22: qty 1

## 2013-06-22 MED ORDER — 0.9 % SODIUM CHLORIDE (POUR BTL) OPTIME
TOPICAL | Status: DC | PRN
  Administered 2013-06-22: 1000 mL

## 2013-06-22 MED ORDER — ACETAMINOPHEN 325 MG PO TABS: 650.0000 mg | ORAL_TABLET | Freq: Four times a day (QID) | ORAL | Status: DC | PRN

## 2013-06-22 MED ORDER — METOPROLOL TARTRATE 1 MG/ML IV SOLN
5.0000 mg | INTRAVENOUS | Status: DC | PRN
  Administered 2013-06-22: 10 mg via INTRAVENOUS

## 2013-06-22 MED ORDER — SUCCINYLCHOLINE CHLORIDE 20 MG/ML IJ SOLN
INTRAMUSCULAR | Status: DC | PRN
  Administered 2013-06-22: 60 mg via INTRAVENOUS

## 2013-06-22 MED ORDER — ACETAMINOPHEN 160 MG/5ML PO SOLN
325.0000 mg | ORAL | Status: DC | PRN
  Filled 2013-06-22: qty 20.3

## 2013-06-22 MED ORDER — LACTATED RINGERS IV SOLN
INTRAVENOUS | Status: DC | PRN
  Administered 2013-06-22 (×2): via INTRAVENOUS

## 2013-06-22 MED ORDER — METOCLOPRAMIDE HCL 5 MG/ML IJ SOLN
5.0000 mg | Freq: Three times a day (TID) | INTRAMUSCULAR | Status: DC | PRN
  Filled 2013-06-22: qty 2

## 2013-06-22 MED ORDER — MORPHINE SULFATE 2 MG/ML IJ SOLN
1.0000 mg | INTRAMUSCULAR | Status: DC | PRN
Start: 2013-06-22 — End: 2013-06-29
  Administered 2013-06-22 – 2013-06-25 (×9): 2 mg via INTRAVENOUS
  Administered 2013-06-28: 1 mg via INTRAVENOUS
  Administered 2013-06-29: 2 mg via INTRAVENOUS
  Filled 2013-06-22 (×12): qty 1

## 2013-06-22 MED ORDER — SODIUM CHLORIDE 0.9 % IV SOLN
INTRAVENOUS | Status: DC
  Administered 2013-06-22: 18:00:00 via INTRAVENOUS
  Administered 2013-06-23: 100 mL/h via INTRAVENOUS

## 2013-06-22 MED ORDER — MUPIROCIN 2 % EX OINT
1.0000 "application " | TOPICAL_OINTMENT | Freq: Two times a day (BID) | CUTANEOUS | Status: AC
  Administered 2013-06-22 – 2013-06-27 (×10): 1 via NASAL
  Filled 2013-06-22 (×2): qty 22

## 2013-06-22 MED ORDER — NEOSTIGMINE METHYLSULFATE 1 MG/ML IJ SOLN
INTRAMUSCULAR | Status: DC | PRN
  Administered 2013-06-22: 3 mg via INTRAVENOUS

## 2013-06-22 MED ORDER — CHLORHEXIDINE GLUCONATE CLOTH 2 % EX PADS
6.0000 | MEDICATED_PAD | Freq: Every day | CUTANEOUS | Status: AC
  Administered 2013-06-23 – 2013-06-27 (×4): 6 via TOPICAL

## 2013-06-22 MED ORDER — METOCLOPRAMIDE HCL 5 MG PO TABS
5.0000 mg | ORAL_TABLET | Freq: Three times a day (TID) | ORAL | Status: DC | PRN
  Filled 2013-06-22: qty 2

## 2013-06-22 MED ORDER — METOPROLOL TARTRATE 1 MG/ML IV SOLN
INTRAVENOUS | Status: AC
  Administered 2013-06-22: 14:00:00 10 mg via INTRAVENOUS
  Filled 2013-06-22: qty 10

## 2013-06-22 MED ORDER — DOCUSATE SODIUM 100 MG PO CAPS
100.0000 mg | ORAL_CAPSULE | Freq: Two times a day (BID) | ORAL | Status: DC
  Administered 2013-06-23 – 2013-06-29 (×13): 100 mg via ORAL
  Filled 2013-06-22 (×16): qty 1

## 2013-06-22 MED ORDER — PHENYLEPHRINE HCL 10 MG/ML IJ SOLN
INTRAMUSCULAR | Status: DC | PRN
  Administered 2013-06-22 (×7): 40 ug via INTRAVENOUS

## 2013-06-22 MED ORDER — ONDANSETRON HCL 4 MG/2ML IJ SOLN: 4.0000 mg | Freq: Four times a day (QID) | INTRAMUSCULAR | Status: DC | PRN

## 2013-06-22 MED ORDER — OXYCODONE HCL 5 MG PO TABS: 5.0000 mg | ORAL_TABLET | Freq: Once | ORAL | Status: DC | PRN

## 2013-06-22 MED ORDER — CLINDAMYCIN PHOSPHATE 600 MG/50ML IV SOLN
600.0000 mg | Freq: Four times a day (QID) | INTRAVENOUS | Status: AC
  Administered 2013-06-22 – 2013-06-23 (×2): 600 mg via INTRAVENOUS
  Filled 2013-06-22 (×4): qty 50

## 2013-06-22 MED ORDER — ROCURONIUM BROMIDE 100 MG/10ML IV SOLN
INTRAVENOUS | Status: DC | PRN
  Administered 2013-06-22: 50 mg via INTRAVENOUS

## 2013-06-22 MED ORDER — ONDANSETRON HCL 4 MG PO TABS: 4.0000 mg | ORAL_TABLET | Freq: Four times a day (QID) | ORAL | Status: DC | PRN

## 2013-06-22 MED ORDER — ACETAMINOPHEN 325 MG PO TABS: 325.0000 mg | ORAL_TABLET | ORAL | Status: DC | PRN

## 2013-06-22 MED ORDER — FENTANYL CITRATE 0.05 MG/ML IJ SOLN
INTRAMUSCULAR | Status: DC | PRN
  Administered 2013-06-22: 100 ug via INTRAVENOUS
  Administered 2013-06-22: 50 ug via INTRAVENOUS

## 2013-06-22 MED ORDER — ASPIRIN EC 325 MG PO TBEC
325.0000 mg | DELAYED_RELEASE_TABLET | Freq: Every day | ORAL | Status: DC
  Administered 2013-06-23 – 2013-06-29 (×7): 325 mg via ORAL
  Filled 2013-06-22 (×9): qty 1

## 2013-06-22 MED ORDER — GLYCOPYRROLATE 0.2 MG/ML IJ SOLN
INTRAMUSCULAR | Status: DC | PRN
  Administered 2013-06-22: .4 mg via INTRAVENOUS

## 2013-06-22 MED ORDER — CEFAZOLIN SODIUM-DEXTROSE 2-3 GM-% IV SOLR
INTRAVENOUS | Status: DC | PRN
  Administered 2013-06-22: 2 g via INTRAVENOUS

## 2013-06-22 MED ORDER — MENTHOL 3 MG MT LOZG: 1.0000 | LOZENGE | OROMUCOSAL | Status: DC | PRN

## 2013-06-22 MED ORDER — DOCUSATE SODIUM 100 MG PO CAPS: 100.0000 mg | ORAL_CAPSULE | Freq: Two times a day (BID) | ORAL | Status: DC

## 2013-06-22 MED ORDER — ROPIVACAINE HCL 5 MG/ML IJ SOLN
INTRAMUSCULAR | Status: DC | PRN
  Administered 2013-06-22: 30 mL via PERINEURAL

## 2013-06-22 MED ORDER — ONDANSETRON HCL 4 MG/2ML IJ SOLN
INTRAMUSCULAR | Status: DC | PRN
  Administered 2013-06-22: 4 mg via INTRAVENOUS

## 2013-06-22 MED ORDER — CEFAZOLIN SODIUM-DEXTROSE 2-3 GM-% IV SOLR
INTRAVENOUS | Status: AC
  Filled 2013-06-22: qty 50

## 2013-06-22 MED ORDER — PHENOL 1.4 % MT LIQD: 1.0000 | OROMUCOSAL | Status: DC | PRN

## 2013-06-22 SURGICAL SUPPLY — 67 items
BIT DRILL AO GAMMA 4.2X180 (BIT) ×2 IMPLANT
BRUSH CYTOL CELLEBRITY 1.5X140 (MISCELLANEOUS) IMPLANT
CANISTER SUCTION 2500CC (MISCELLANEOUS) ×4 IMPLANT
CATH FOLEY 2WAY SLVR  5CC 14FR (CATHETERS) ×2
CATH FOLEY 2WAY SLVR 5CC 14FR (CATHETERS) IMPLANT
CLOSURE STERI-STRIP 1/2X4 (GAUZE/BANDAGES/DRESSINGS) ×1
CLOTH BEACON ORANGE TIMEOUT ST (SAFETY) ×4 IMPLANT
CLSR STERI-STRIP ANTIMIC 1/2X4 (GAUZE/BANDAGES/DRESSINGS) ×1 IMPLANT
CONT SPEC 4OZ CLIKSEAL STRL BL (MISCELLANEOUS) ×4 IMPLANT
COVER MAYO STAND STRL (DRAPES) ×6 IMPLANT
COVER SURGICAL LIGHT HANDLE (MISCELLANEOUS) ×4 IMPLANT
COVER TABLE BACK 60X90 (DRAPES) ×4 IMPLANT
DRAPE STERI IOBAN 125X83 (DRAPES) ×4 IMPLANT
DRSG ADAPTIC 3X8 NADH LF (GAUZE/BANDAGES/DRESSINGS) ×2 IMPLANT
DRSG MEPILEX BORDER 4X4 (GAUZE/BANDAGES/DRESSINGS) ×6 IMPLANT
DRSG TEGADERM 2-3/8X2-3/4 SM (GAUZE/BANDAGES/DRESSINGS) ×2 IMPLANT
DRSG TEGADERM 4X4.75 (GAUZE/BANDAGES/DRESSINGS) ×2 IMPLANT
DURAPREP 26ML APPLICATOR (WOUND CARE) ×4 IMPLANT
ELECT REM PT RETURN 9FT ADLT (ELECTROSURGICAL) ×4
ELECTRODE REM PT RTRN 9FT ADLT (ELECTROSURGICAL) ×2 IMPLANT
FILTER STRAW FLUID ASPIR (MISCELLANEOUS) ×4 IMPLANT
FORCEPS BIOP RJ4 1.8 (CUTTING FORCEPS) IMPLANT
GAUZE XEROFORM 5X9 LF (GAUZE/BANDAGES/DRESSINGS) ×4 IMPLANT
GLOVE BIO SURGEON STRL SZ7.5 (GLOVE) ×4 IMPLANT
GLOVE BIOGEL PI IND STRL 7.0 (GLOVE) IMPLANT
GLOVE BIOGEL PI IND STRL 8 (GLOVE) ×2 IMPLANT
GLOVE BIOGEL PI INDICATOR 7.0 (GLOVE) ×2
GLOVE BIOGEL PI INDICATOR 8 (GLOVE) ×2
GLOVE SURG SIGNA 7.5 PF LTX (GLOVE) ×4 IMPLANT
GLOVE SURG SS PI 7.0 STRL IVOR (GLOVE) ×4 IMPLANT
GOWN STRL NON-REIN LRG LVL3 (GOWN DISPOSABLE) ×10 IMPLANT
GUIDEROD T2 3X1000 (ROD) ×2 IMPLANT
K-WIRE  3.2X450M STR (WIRE) ×2
K-WIRE 3.2X450M STR (WIRE) ×2
KIT ROOM TURNOVER OR (KITS) ×8 IMPLANT
KWIRE 3.2X450M STR (WIRE) IMPLANT
MANIFOLD NEPTUNE II (INSTRUMENTS) ×2 IMPLANT
MARKER SKIN DUAL TIP RULER LAB (MISCELLANEOUS) ×4 IMPLANT
NAIL GAMMA LG R 5TI 10X360X125 (Nail) ×2 IMPLANT
NDL BIOPSY TRANSBRONCH 21G (NEEDLE) IMPLANT
NEEDLE BIOPSY TRANSBRONCH 21G (NEEDLE) IMPLANT
NEEDLE SYS SONOTIP II EBUSTBNA (NEEDLE) IMPLANT
NS IRRIG 1000ML POUR BTL (IV SOLUTION) ×8 IMPLANT
OIL SILICONE PENTAX (PARTS (SERVICE/REPAIRS)) IMPLANT
PACK GENERAL/GYN (CUSTOM PROCEDURE TRAY) ×4 IMPLANT
PAD ARMBOARD 7.5X6 YLW CONV (MISCELLANEOUS) ×16 IMPLANT
SCREW LAG GAMMA 3 TI 10.5X105M (Screw) ×2 IMPLANT
SCREW LOCKING T2 F/T  5X42.5MM (Screw) ×2 IMPLANT
SCREW LOCKING T2 F/T 5X42.5MM (Screw) IMPLANT
SPONGE GAUZE 4X4 12PLY (GAUZE/BANDAGES/DRESSINGS) ×2 IMPLANT
STAPLER VISISTAT 35W (STAPLE) ×4 IMPLANT
SUT MNCRL AB 4-0 PS2 18 (SUTURE) IMPLANT
SUT MON AB 2-0 CT1 27 (SUTURE) ×4 IMPLANT
SUT VIC AB 0 CT1 27 (SUTURE) ×4
SUT VIC AB 0 CT1 27XBRD ANBCTR (SUTURE) ×2 IMPLANT
SYR 20CC LL (SYRINGE) ×4 IMPLANT
SYR 20ML ECCENTRIC (SYRINGE) ×8 IMPLANT
SYR 5ML LUER SLIP (SYRINGE) ×8 IMPLANT
SYR TOOMEY 50ML (SYRINGE) ×8 IMPLANT
TOWEL OR 17X24 6PK STRL BLUE (TOWEL DISPOSABLE) ×8 IMPLANT
TOWEL OR 17X26 10 PK STRL BLUE (TOWEL DISPOSABLE) ×4 IMPLANT
TOWEL OR NON WOVEN STRL DISP B (DISPOSABLE) ×4 IMPLANT
TRAP SPECIMEN MUCOUS 40CC (MISCELLANEOUS) ×4 IMPLANT
TRAY FOLEY CATH 16FRSI W/METER (SET/KITS/TRAYS/PACK) ×2 IMPLANT
TUBE CONNECTING 12'X1/4 (SUCTIONS) ×1
TUBE CONNECTING 12X1/4 (SUCTIONS) ×3 IMPLANT
WATER STERILE IRR 1000ML POUR (IV SOLUTION) IMPLANT

## 2013-06-22 NOTE — Anesthesia Preprocedure Evaluation (Signed)
Anesthesia Evaluation  Patient identified by MRN, date of birth, ID band Patient awake    Reviewed: Allergy & Precautions, H&P , NPO status , Patient's Chart, lab work & pertinent test results, reviewed documented beta blocker date and time   History of Anesthesia Complications Negative for: history of anesthetic complications  Airway Mallampati: II TM Distance: >3 FB Neck ROM: Full    Dental  (+) Teeth Intact   Pulmonary shortness of breath and at rest, Current Smoker,  Quit smoking 06/17/2013 at which time she was smoking 1.5 ppd. Patient extremely SOB on examination on [place]. Patient with large right upper lobe lesion with invasion into the mediastinum. There is mass effect on the SVC and RUPV.   Minimal right lung sounds with expiratory wheeze. Left lung clear. Accessory muscle use evident.         Cardiovascular Exercise Tolerance: Poor hypertension, Pt. on medications and Pt. on home beta blockers - anginaRhythm:Regular Rate:Normal - Systolic murmurs    Neuro/Psych Anxiety Depression negative neurological ROS     GI/Hepatic negative GI ROS, Hepatic steatosis.    Endo/Other  neg diabetes  Renal/GU negative Renal ROS  negative genitourinary   Musculoskeletal   Abdominal   Peds  Hematology  (+) anemia ,   Anesthesia Other Findings   Reproductive/Obstetrics                           Anesthesia Physical Anesthesia Plan  ASA: III  Anesthesia Plan: General   Post-op Pain Management:    Induction: Intravenous  Airway Management Planned: Oral ETT  Additional Equipment: Arterial line  Intra-op Plan:   Post-operative Plan: Extubation in OR  Informed Consent: I have reviewed the patients History and Physical, chart, labs and discussed the procedure including the risks, benefits and alternatives for the proposed anesthesia with the patient or authorized representative who has indicated  his/her understanding and acceptance.   Dental advisory given  Plan Discussed with: CRNA and Surgeon  Anesthesia Plan Comments:         Anesthesia Quick Evaluation

## 2013-06-22 NOTE — Preoperative (Signed)
Beta Blockers   Reason not to administer Beta Blockers:Not Applicable 

## 2013-06-22 NOTE — Op Note (Signed)
Name:  [name] MRN:  [id number] DOB:  [birth year]  PROCEDURE NOTE  Procedure(s): Flexible bronchoscopy [phone]) Bronchial alveolar lavage [phone]) of the RML Endobronchial ultrasound ([id number]) Transbronchial needle aspiration ([id number]) of the R paratracheal mass  Indications:  Hilar / mediastinal lymphadenopathy.  Consent:  Procedure, benefits, risks and alternatives discussed.  Questions answered.  Consent obtained.  Anesthesia:  General endotracheal.  Procedure summary:  Appropriate equipment was assembled.  The patient was brought to the operating room and identified as Stephanie Oliver.  Safety timeout was performed. The patient was placed supine on the operating table, airway established and general anesthesia administered by Anesthesia team.   After the appropriate level of anesthesia was assured, flexible video bronchoscope was lubricated and inserted through the endotracheal tube.  Total of 6 mL of 1% Lidocaine were administered through the bronchoscope to augment general anesthesia.  Airway examination was performed bilaterally to subsegmental level.  Minimal clear secretions were noted, mucosa appeared normal and no endobronchial lesions were identified.  Bronchial alveolar lavage of the right middle lobe was performed with 100 mL of normal saline and return of 75 mL of fluid, after which the bronchoscope was withdrawn.  Endobronchial ultrasound video bronchoscope was then lubricated and inserted through the endotracheal tube. Surveillance of the mediastinal and and bilateral hilar lymph node stations was performed.  Pathologically enlarged lymph nodes were noted.  Endobronchial ultrasound guided transbronchial needle aspiration of right paratracheal mass was performed, after which EBUS bronchoscope was withdrawn.  Flexible video bronchoscope was used again to perform random endobronchial mucosal biopsies.  After hemostasis was assure, the bronchoscope was withdrawn.  The patient was  extubated in operating room and transferred to PACU. Post-procedure chest x-ray was ordered.  Specimens sent: Bronchial alveolar lavage specimen of the RML for cytology EBUS TBNA sample of the R paratracheal mass for pathology  Complications:  No immediate complications were noted.  Hemodynamic parameters and oxygenation remained stable throughout the procedure.  Estimated blood loss:  Less then 5 mL.  Penne Lash, M.D. [facility]: [phone]  06/22/2013, 12:19 PM

## 2013-06-22 NOTE — Anesthesia Procedure Notes (Signed)
Anesthesia Regional Block:    Pre-Anesthetic Checklist: ,, timeout performed, Correct Patient, Correct Site, Correct Laterality, Correct Procedure, Correct Position, risks and benefits discussed,, pre-op evaluation, post-op pain management  Laterality: Right  Prep: chloraprep       Needles:  Injection technique: Single-shot  Needle Type: Stimiplex          Additional Needles:  Procedures: ultrasound guided (picture in chart)  Narrative:  Start time: 06/22/2013 12:40 PM End time: 06/22/2013 12:45 PM Injection made incrementally with aspirations every 5 mL.

## 2013-06-22 NOTE — H&P (View-Only) (Signed)
ORTHOPAEDIC CONSULTATION  REQUESTING PHYSICIAN: Nita Sells, MD  Chief Complaint: R hip pain  HPI: Stephanie Oliver is a 63 y.o. female who complains of  Mechanical fall onto her R side  Past Medical History  Diagnosis Date  . Depression   . Panic attacks   . Hypertension   . Smoker   . Alcohol abuse    History reviewed. No pertinent past surgical history. History   Social History  . Marital Status: Widowed    Spouse Name: N/A    Number of Children: N/A  . Years of Education: N/A   Social History Main Topics  . Smoking status: Current Every Day Smoker -- 1.50 packs/day    Types: Cigarettes  . Smokeless tobacco: None  . Alcohol Use: Yes  . Drug Use: No  . Sexual Activity: None   Other Topics Concern  . None   Social History Narrative   Heavy smoker till 06/17/13, smoked from age 38 [id number] packs per day   Current moderate to heavy drinker, drinks 3-4 beers daily, if she cannot get the beer she drinks 3-4 shots of whiskey daily.                                           Lives currently with her daughter and 16-monthold granddaughter at home   Next of kin = [name] phone number [phone]  Used to work until the age of 89and then went into early retirement   Previously has had suicidal attempt   [place]   Family History  Problem Relation Age of Onset  . CAD Father   . Arthritis Mother    Allergies  Allergen Reactions  . Lisinopril Diarrhea and Nausea Only  . Paxil [Paroxetine] Diarrhea and Nausea Only  . Penicillins Hives   Prior to Admission medications   Medication Sig Start Date End Date Taking? Authorizing Provider  ALPRAZolam (Duanne Moron 0.5 MG tablet Take 0.5 mg by mouth 2 (two) times daily as needed for anxiety.  06/14/13  Yes Historical Provider, MD  aspirin 325 MG tablet Take 325 mg by mouth daily.   Yes Historical Provider, MD  Cholecalciferol (VITAMIN D-3 PO) Take 1 tablet  by mouth daily.   Yes Historical Provider, MD  metoprolol succinate (TOPROL-XL) 100 MG 24 hr tablet Take 100 mg by mouth daily. 05/04/13  Yes Historical Provider, MD  Multiple Vitamin (MULTIVITAMIN WITH MINERALS) TABS tablet Take 1 tablet by mouth daily.   Yes Historical Provider, MD  naproxen sodium (ANAPROX) 220 MG tablet Take 220 mg by mouth 2 (two) times daily with a meal.   Yes Historical Provider, MD  sertraline (ZOLOFT) 100 MG tablet Take 100 mg by mouth daily. 05/04/13  Yes Historical Provider, MD   Dg Hip Complete Right  06/21/2013   CLINICAL DATA:  Right hip pain secondary to a fall 2 days ago.  EXAM: RIGHT HIP - COMPLETE 2+ VIEW  COMPARISON:  None.  FINDINGS: There is an angulated comminuted slightly impacted intertrochanteric fracture of the proximal right femur. Lesser trochanter is displaced. 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  Pelvic bones are intact.  IMPRESSION: Comminuted intertrochanteric fracture of the proximal right femur.   Electronically Signed   By: JRozetta NunneryM.D.   On: 06/21/2013 11:26  Ct Head Wo Contrast  06/21/2013   CLINICAL DATA:  History of trauma from a fall. Right leg pain and weakness.  EXAM: CT HEAD WITHOUT CONTRAST  TECHNIQUE: Contiguous axial images were obtained from the base of the skull through the vertex without intravenous contrast.  COMPARISON:  No priors.  FINDINGS: Patchy and confluent areas of decreased attenuation are noted throughout the deep and periventricular white matter of the cerebral hemispheres bilaterally, compatible with chronic microvascular ischemic disease. No acute displaced skull fractures are identified. No acute intracranial abnormality. Specifically, no evidence of acute post-traumatic intracranial hemorrhage, no definite regions of acute/subacute cerebral ischemia, no focal mass, mass effect, hydrocephalus or abnormal intra or extra-axial fluid collections. The visualized paranasal sinuses and mastoids are well  pneumatized.  IMPRESSION: 1. No acute displaced skull fracture or findings to suggest acute intracranial trauma. 2. Chronic microvascular ischemic changes in the cerebral white matter, as above.   Electronically Signed   By: Vinnie Langton M.D.   On: 06/21/2013 12:11   Dg Chest Port 1 View  06/21/2013   CLINICAL DATA:  Preop for hip fracture.  Slight cough.  EXAM: PORTABLE CHEST - 1 VIEW  COMPARISON:  None.  FINDINGS: The heart size is normal. A right peritracheal mass measures 5.3 x 7.0 cm. Other smaller right-sided nodules are evident. A 2 cm left pulmonary nodule is suggested. There is no edema or effusion to suggest failure. The right hemidiaphragm is elevated.  No acute focal airspace disease is evident.  IMPRESSION: 1. 7 cm right peritracheal mass. Recommend CT of the chest with contrast for further evaluation. 2. Additional smaller nodules are suggested, including a 2 cm left lower lobe nodule.   Electronically Signed   By: Lawrence Santiago M.D.   On: 06/21/2013 15:10    Positive ROS: All other systems have been reviewed and were otherwise negative with the exception of those mentioned in the HPI and as above.  Labs cbc  Recent Labs  06/21/13 1136  WBC 14.7*  HGB 11.7*  HCT 34.4*  PLT 310    Labs inflam No results found for this basename: ESR, CRP,  in the last 72 hours  Labs coag  Recent Labs  06/21/13 1136  INR 0.93     Recent Labs  06/21/13 1136  NA 135*  K 3.8  CL 90*  CO2 31  GLUCOSE 101*  BUN 36*  CREATININE 0.63  CALCIUM 9.6    Physical Exam: Filed Vitals:   06/21/13 1345  BP: 104/65  Pulse: 77  Temp:   Resp:    General: Alert, no acute distress Cardiovascular: No pedal edema Respiratory: No cyanosis, no use of accessory musculature GI: No organomegaly, abdomen is soft and non-tender Skin: No lesions in the area of chief complaint Neurologic: Sensation intact distally Psychiatric: Patient is competent for consent with normal mood and  affect Lymphatic: No axillary or cervical lymphadenopathy  MUSCULOSKELETAL:  Pain with log roll, skin benign, distally NVI Other extremities are atraumatic with painless ROM and NVI.  Assessment: Right intertroch hip fracture  Plan: IM nail in OR on 1/6 pending clearance by Hospitalist service Weight Bearing Status: Bedrest for now, will adjust likely to WBAT post op PT VTE px: SCD's and [employer] today, will restart aspirin post op  New finding of a lung mass on Xray and CT pulmonary involved. Have discussed with Anesthesia and we feel that the benefits of surgery outweigh the risk as does the family and patient   [place] Boyett,  Ernesta Amble, MD Cell [phone]   06/21/2013 3:40 PM

## 2013-06-22 NOTE — OR Nursing (Signed)
End for Hip Procedure @1108 .

## 2013-06-22 NOTE — Progress Notes (Signed)
Pt HR jumped to 137-142bpm, Dr.Moser notified, at bedside to evaluate patient, new orders rec'd for IV metoprolol, 56m IV Met given, pt tol/well, HR 77 at this time, EKG completed as ordered, NSR, Dr.Moser updated on patient status, no further new orders at this time, will cont to monitor.

## 2013-06-22 NOTE — Progress Notes (Signed)
Pt's 2 PIV in right arm were infiltrated upon assessment. PIV attempt X2 and failed. IV team paged. Currently waiting for PIV placement in order to administer IVPB antibiotics that were not administered on previous shift. Pt currently AAO vs WNL. Will continue to monitor closely.

## 2013-06-22 NOTE — Interval H&P Note (Signed)
History and Physical Interval Note:  06/22/2013 7:34 AM  Stephanie Oliver  has presented today for surgery, with the diagnosis of FX RIGHT HIP   The various methods of treatment have been discussed with the patient and family. After consideration of risks, benefits and other options for treatment, the patient has consented to  Procedure(s): RIGHT HIP GAMMA NAIL FIXATION    (Right) as a surgical intervention .  The patient's history has been reviewed, patient examined, no change in status, stable for surgery.  I have reviewed the patient's chart and labs.  Questions were answered to the patient's satisfaction.     Dawnyel Leven, D

## 2013-06-22 NOTE — Progress Notes (Signed)
INITIAL NUTRITION ASSESSMENT  DOCUMENTATION CODES Per approved criteria  -Not Applicable   INTERVENTION: Provide Ensure Complete BID when diet advanced Provide Multivitamin with minerals daily RD to continue to monitor  NUTRITION DIAGNOSIS: Predicted suboptimal energy intake related to recent surgery and decreased appetite as evidenced by pt's chart and current NPO status.   Goal: Pt to meet >/= 90% of their estimated nutrition needs   Monitor:  Diet advancement PO intake Weight trends Labs  Reason for Assessment: Consult/ MST  63 y.o. female  Admitting Dx: <principal problem not specified>  ASSESSMENT: 63 yo active smoker with reported weight loss since 2 years ago admitted for hip fracture and noted to have large rite sided lung mass. RD consulted to assess pt but, pt out of room for surgery all day. Per MST report, pt reported recent unintentional weight loss and eating poorly due to decreased appetite PTA.   Height: Ht Readings from Last 1 Encounters:  06/21/13 5\' 3"  (1.6 m)    Weight: Wt Readings from Last 1 Encounters:  06/21/13 132 lb (59.875 kg)    Ideal Body Weight: 115 lbs  % Ideal Body Weight: 115%  Wt Readings from Last 10 Encounters:  06/21/13 132 lb (59.875 kg)  06/21/13 132 lb (59.875 kg)    Usual Body Weight: unknown  % Usual Body Weight: NA  BMI:  Body mass index is 23.39 kg/(m^2).  Estimated Nutritional Needs: Kcal: 1500-1700 Protein: 70-80 grams Fluid: >/= 1.7 L/day  Skin: +3 RLE ; right hip incision  Diet Order: NPO  EDUCATION NEEDS: -No education needs identified at this time   Intake/Output Summary (Last 24 hours) at 06/22/13 1556 Last data filed at 06/22/13 1255  Gross per 24 hour  Intake   2155 ml  Output    325 ml  Net   1830 ml    Last BM: 1/5   Labs:   Recent Labs Lab 06/21/13 1136 06/22/13 0520  NA 135* 134*  K 3.8 3.2*  CL 90* 92*  CO2 31 31  BUN 36* 24*  CREATININE 0.63 0.50  CALCIUM 9.6 8.7   GLUCOSE 101* 138*    CBG (last 3)  No results found for this basename: GLUCAP,  in the last 72 hours  Scheduled Meds: . acetaminophen  1,000 mg Oral Once  . chlorhexidine  60 mL Topical Once  . clindamycin (CLEOCIN) IV  900 mg Intravenous On Call to OR  . [MAR HOLD] enoxaparin (LOVENOX) injection  40 mg Subcutaneous Q24H  . [facility] HOLD] folic acid  1 mg Oral Daily  . [facility] HOLD] metoprolol succinate  100 mg Oral Daily  . [MAR HOLD] multivitamin with minerals  1 tablet Oral Daily  . [facility] HOLD] sertraline  100 mg Oral Daily  . [facility] HOLD] thiamine  100 mg Oral Daily   Or  . [facility] HOLD] thiamine  100 mg Intravenous Daily    Continuous Infusions: . dextrose 5 % and 0.45% NaCl 100 mL/hr (06/21/13 2212)    Past Medical History  Diagnosis Date  . Depression   . Panic attacks   . Hypertension   . Smoker   . Alcohol abuse     History reviewed. No pertinent past surgical history.  Pryor Ochoa RD, LDN Inpatient Clinical Dietitian Pager: [phone] After Hours Pager: [phone]

## 2013-06-22 NOTE — Progress Notes (Signed)
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 and plan to Bx via EBUS during GA for right hip fracture repair. This was discussed with patient's daughter on the phone. - Continue nicotine patch, bronchodilators as needed, CT head  Acute encephalopathy: Likely due to pain medications and sedatives, UA negative for UTI however may need to rule out any metastasis to brain, will check CT head at some point    Alcohol abuse, continuous - continue CIWA     Hypertension - Stable    COPD (chronic obstructive pulmonary disease) - Stable continue bronchodilators as needed  DVT Prophylaxis: Per orthopedics, Lovenox  Code Status:  Family Communication: Discussed with daughter  Disposition:    Subjective: Patient seen and examined earlier this morning, was somewhat sedated with Ativan and morphine  Objective: Weight change:   Intake/Output Summary (Last 24 hours) at 06/22/13 1439 Last data filed at 06/22/13 1255  Gross per 24 hour  Intake   2155 ml  Output    325 ml  Net   1830 ml   Blood pressure 112/69, pulse 75, temperature 96.4 F (35.8 C), temperature source Oral, resp. rate 16, height 5\' 3"  (1.6 m), weight 59.875 kg (132 lb), SpO2 98.00%.  Physical Exam: General:  Sedated, NAD CVS: S1-S2 clear, no murmur rubs or gallops Chest: CTAB Abdomen: soft NT, ND,  NBS Extremities: no c/c/e bilaterally   Lab Results: Basic Metabolic Panel:  Recent Labs Lab 06/21/13 1136 06/22/13 0520  NA 135* 134*  K 3.8 3.2*  CL 90* 92*  CO2 31 31  GLUCOSE 101* 138*  BUN 36* 24*  CREATININE 0.63 0.50  CALCIUM 9.6 8.7   Liver Function Tests: No results found for this basename: AST, ALT, ALKPHOS, BILITOT, PROT, ALBUMIN,  in the last 168 hours No results found for this basename: LIPASE, AMYLASE,  in the last 168 hours No results found for this basename: AMMONIA,  in the last 168 hours CBC:  Recent Labs Lab 06/21/13 1136 06/22/13 0520  WBC 14.7* 12.0*  NEUTROABS 11.0*  --   HGB 11.7* 10.4*  HCT 34.4* 31.0*  MCV 98.9 101.3*  PLT 310 276   Cardiac Enzymes: No results found for this basename: CKTOTAL, CKMB, CKMBINDEX, TROPONINI,  in the last 168 hours BNP: No components found with this basename: POCBNP,  CBG: No results found for this basename: GLUCAP,  in the last 168 hours   Micro Results: Recent Results (from the past 240 hour(s))  SURGICAL PCR SCREEN     Status: Abnormal   Collection Time    06/21/13 10:04 PM      Result Value Range Status   MRSA, PCR NEGATIVE  NEGATIVE Final   Staphylococcus aureus POSITIVE (*) NEGATIVE Final   Comment:            The Xpert SA Assay (FDA     approved for NASAL specimens     in patients over  15 years of age),     is one component of     a comprehensive surveillance     program.  Test performance has     been validated by [employer] for patients greater     than or equal to 32 year old.     It is not intended     to diagnose infection nor to     guide or monitor treatment.     RESULT CALLED TO, READ BACK BY AND VERIFIED WITH:     T. COOK (RN) [phone] 06/22/2013 L. LOMAX    Studies/Results: Dg Hip Complete Right  06/21/2013   CLINICAL DATA:  Right hip pain secondary to a fall 2 days ago.  EXAM: RIGHT HIP - COMPLETE 2+ VIEW  COMPARISON:  None.  FINDINGS: There is an angulated comminuted slightly  impacted intertrochanteric fracture of the proximal right femur. Lesser trochanter is displaced. 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  Pelvic bones are intact.  IMPRESSION: Comminuted intertrochanteric fracture of the proximal right femur.   Electronically Signed   By: Rozetta Nunnery M.D.   On: 06/21/2013 11:26   Dg Femur Right  06/22/2013   CLINICAL DATA:  Right femur fracture.  EXAM: RIGHT FEMUR - 2 VIEW; DG C-ARM 1-60 MIN  COMPARISON:  Radiographs dated 06/21/2013  FINDINGS: C-arm images demonstrate insertion of intra medullary nail and compression screw across the intertrochanteric fracture of the proximal right femur. Alignment and position of the fracture fragments is near anatomic. Hardware appears in good position.  IMPRESSION: Open reduction and internal fixation of intertrochanteric fracture of the proximal right femur.   Electronically Signed   By: Rozetta Nunnery M.D.   On: 06/22/2013 13:08   Ct Head Wo Contrast  06/21/2013   CLINICAL DATA:  History of trauma from a fall. Right leg pain and weakness.  EXAM: CT HEAD WITHOUT CONTRAST  TECHNIQUE: Contiguous axial images were obtained from the base of the skull through the vertex without intravenous contrast.  COMPARISON:  No priors.  FINDINGS: Patchy and confluent areas of decreased attenuation are noted throughout the deep and periventricular white matter of the cerebral hemispheres bilaterally, compatible with chronic microvascular ischemic disease. No acute displaced skull fractures are identified. No acute intracranial abnormality. Specifically, no evidence of acute post-traumatic intracranial hemorrhage, no definite regions of acute/subacute cerebral ischemia, no focal mass, mass effect, hydrocephalus or abnormal intra or extra-axial fluid collections. The visualized paranasal sinuses and mastoids are well pneumatized.  IMPRESSION: 1. No acute displaced skull fracture or findings to suggest acute intracranial trauma. 2. Chronic  microvascular ischemic changes in the cerebral white matter, as above.   Electronically Signed   By: Vinnie Langton M.D.   On: 06/21/2013 12:11   Ct Chest W Contrast  06/21/2013   CLINICAL DATA:  Paratracheal mass  EXAM: CT CHEST WITH CONTRAST  TECHNIQUE: Multidetector CT imaging of the chest was performed during intravenous contrast administration.  CONTRAST:  One daughter cc of Omnipaque 300  COMPARISON:  Prior radiograph performed earlier on the same date.  FINDINGS: The visualized thyroid is within normal limits.  Aorta and great vessels are of normal caliber. Prominent atherosclerotic calcifications noted at the origin of the great vessels.  Heart size is within normal limits.  No pericardial effusion.  A large heterogeneous mass measuring 7.2 x 5.0 x 6.4 cm is seen within the medial aspect of the right upper lobe (series 2, image 20).  Central hypodensity seen within this lesion is most consistent with necrosis. The mass abuts and invades the anterior mediastinum medially, and anterior medial chest wall anteriorly. There is secondary mass effect on knee adjacent SVC. Right upper lobe segmental pulmonary arteries are attenuated at the level of the mass. There is associated atelectasis within the right upper lobe Finding is highly concerning for bronchogenic carcinoma.  An adjacent satellite nodule measuring 6 mm is seen at the lateral aspect of the primary mass lesion (series 3, image 20). Additional subpleural nodules measuring up to 7 mm are seen more peripherally within the right upper lobe (series 3, image 23, 24). Additional dense 7 mm nodule seen more superiorly near the right lung apex (series 3, image 11).  No focal infiltrate or pleural effusion.  A large necrotic right hilar lymph node measuring 4.0 x 4.6 x 4.3 cm is present (series 2, image 27). This node exerts mass effect on the right main pulmonary posteriorly which is slightly narrowed. The root right pulmonary artery and its branches are  opacified distally. A 2nd 1 cm necrotic precarinal node is also present (series 2, image 22).  Diffuse hypoattenuation is seen within liver, suggestive of steatosis. Partially visualized upper abdomen is otherwise unremarkable.  Remote left 8th rib fracture is noted (series 2, image 36). No acute osseous abnormality. No focal lytic or blastic osseous lesions.  IMPRESSION: 1. 7.2 x 5.0 x 6.4 cm necrotic right upper lobe mass, most consistent with primary lung malignancy. The mass appears to be locally invasive into the anterior mediastinum medially. 2. Enlarged necrotic right hilar lymph node with smaller precarinal node as above, worrisome for nodal metastasis. 3. Additional subcentimeter pulmonary nodules within the right upper lobe as above, worrisome for malignancy as well. 4. The subacute healing fracture of the left posterolateral 8th rib. 5. Hepatic steatosis.   Electronically Signed   By: Jeannine Boga M.D.   On: 06/21/2013 16:40   Dg Chest Port 1 View  06/21/2013   CLINICAL DATA:  Preop for hip fracture.  Slight cough.  EXAM: PORTABLE CHEST - 1 VIEW  COMPARISON:  None.  FINDINGS: The heart size is normal. A right peritracheal mass measures 5.3 x 7.0 cm. Other smaller right-sided nodules are evident. A 2 cm left pulmonary nodule is suggested. There is no edema or effusion to suggest failure. The right hemidiaphragm is elevated.  No acute focal airspace disease is evident.  IMPRESSION: 1. 7 cm right peritracheal mass. Recommend CT of the chest with contrast for further evaluation. 2. Additional smaller nodules are suggested, including a 2 cm left lower lobe nodule.   Electronically Signed   By: Lawrence Santiago M.D.   On: 06/21/2013 15:10   Dg C-arm 1-60 Min  06/22/2013   CLINICAL DATA:  Right femur fracture.  EXAM: RIGHT FEMUR - 2 VIEW; DG C-ARM 1-60 MIN  COMPARISON:  Radiographs dated 06/21/2013  FINDINGS: C-arm images demonstrate insertion of intra medullary nail and compression screw across the  intertrochanteric fracture of the proximal right femur. Alignment and position of the fracture fragments is near anatomic. Hardware appears in good position.  IMPRESSION: Open reduction and internal fixation of intertrochanteric fracture of the proximal right femur.   Electronically Signed   By: Rozetta Nunnery M.D.   On: 06/22/2013 13:08    Medications: Scheduled Meds: . acetaminophen  1,000 mg Oral Once  . chlorhexidine  60 mL Topical Once  . clindamycin (CLEOCIN) IV  900 mg Intravenous On Call to OR  . [  MAR HOLD] enoxaparin (LOVENOX) injection  40 mg Subcutaneous Q24H  . [facility] HOLD] folic acid  1 mg Oral Daily  . [facility] HOLD] metoprolol succinate  100 mg Oral Daily  . [MAR HOLD] multivitamin with minerals  1 tablet Oral Daily  . [facility] HOLD] sertraline  100 mg Oral Daily  . [facility] HOLD] thiamine  100 mg Oral Daily   Or  . [facility] HOLD] thiamine  100 mg Intravenous Daily      LOS: 1 day   RAI,RIPUDEEP M.D. [facility] 06/22/2013, 2:39 PM Pager: [phone]  If 7PM-7AM, please contact night-coverage [email or web address] Password [email or web address]

## 2013-06-22 NOTE — Consult Note (Signed)
PULMONARY / CRITICAL CARE MEDICINE  Name: Stephanie Oliver MRN: [id number] DOB: February 19, 1951    ADMISSION DATE:  06/21/2013 CONSULTATION DATE: 06/21/2013  REFERRING MD :  [facility] PRIMARY SERVICE:  TRH  CHIEF COMPLAINT:  Lung mass  BRIEF PATIENT DESCRIPTION: 63 yo active smoker with reported weight loss since 2 years ago admitted for hip fracture and noted to have large rite sided lung mass.  SIGNIFICANT EVENTS / STUDIES:  1/5  Head CT >>>  No acute displaced skull fracture or findings to suggest acute intracranial trauma. Chronic microvascular ischemic changes in the cerebral white matter, as above.  1/5  Chest CT >>>  7.2 x 5.0 x 6.4 cm necrotic right upper lobe mass, most consistent with primary lung malignancy. The mass appears to be locally invasive into the anterior mediastinum medially. Enlarged necrotic right hilar lymph node with smaller precarinal  node as above, worrisome for nodal metastasis. Additional subcentimeter pulmonary nodules within the right upper lobe as above, worrisome for malignancy as well. The subacute healing fracture of the left posterolateral 8th rib. Hepatic steatosis.  LINE / TUBES:  CULTURES:  ANTIBIOTICS:  HISTORY OF PRESENT ILLNESS:  63 yo active smoker with reported weight loss since 2 years ago admitted for hip fracture and noted to have large rite sided lung mass.  At the time of my assessment is encephalopathic and unable to provide detailed history.  Medical records / images reviewed in detail.   PAST MEDICAL HISTORY :  Past Medical History  Diagnosis Date  . Depression   . Panic attacks   . Hypertension   . Smoker   . Alcohol abuse    History reviewed. No pertinent past surgical history. Prior to Admission medications   Medication Sig Start Date End Date Taking? Authorizing Provider  ALPRAZolam Duanne Moron) 0.5 MG tablet Take 0.5 mg by mouth 2 (two) times daily as needed for anxiety.  06/14/13  Yes Historical Provider, MD  aspirin 325 MG tablet Take 325 mg  by mouth daily.   Yes Historical Provider, MD  Cholecalciferol (VITAMIN D-3 PO) Take 1 tablet by mouth daily.   Yes Historical Provider, MD  metoprolol succinate (TOPROL-XL) 100 MG 24 hr tablet Take 100 mg by mouth daily. 05/04/13  Yes Historical Provider, MD  Multiple Vitamin (MULTIVITAMIN WITH MINERALS) TABS tablet Take 1 tablet by mouth daily.   Yes Historical Provider, MD  naproxen sodium (ANAPROX) 220 MG tablet Take 220 mg by mouth 2 (two) times daily with a meal.   Yes Historical Provider, MD  sertraline (ZOLOFT) 100 MG tablet Take 100 mg by mouth daily. 05/04/13  Yes Historical Provider, MD   Allergies  Allergen Reactions  . Lisinopril Diarrhea and Nausea Only  . Paxil [Paroxetine] Diarrhea and Nausea Only  . Penicillins Hives   FAMILY HISTORY:  Family History  Problem Relation Age of Onset  . CAD Father   . Arthritis Mother    SOCIAL HISTORY:  reports that she has been smoking Cigarettes.  She has been smoking about 1.50 packs per day. She does not have any smokeless tobacco history on file. She reports that she drinks alcohol. She reports that she does not use illicit drugs.  REVIEW OF SYSTEMS:  Unable to provide.  SUBJECTIVE:   VITAL SIGNS: Temp:  [97.5 F (36.4 C)-98.4 F (36.9 C)] 97.5 F (36.4 C) (01/06 0600) Pulse Rate:  [74-81] 75 (01/06 0600) Resp:  [16-21] 16 (01/06 0730) BP: (91-137)/(55-78) 114/68 mmHg (01/06 0600) SpO2:  [90 %-99 %] 97 % (  01/06 0730) Weight:  [59.875 kg (132 lb)] 59.875 kg (132 lb) (01/05 2006)  PHYSICAL EXAMINATION: General:  Resting comfortably, appears to be in no acute distress Neuro:  Awake, follows commands, but confused, unable to provide detailed history HEENT:  NCAT, PERRL, moist membranes Neck:  Soft, no bruits, no lymphadenopathy, no carotid btuits Cardiovascular:  RRR, no m/r/g Lungs:  Bilateral air entry, diminished R side Abdomen:  Soft, nontender, bowel sounds present, no organomegaly Musculoskeletal:  Moves all  extremities, no edema, no digital clubbing Skin:  Intact, no rash   Recent Labs Lab 06/21/13 1136 06/22/13 0520  NA 135* 134*  K 3.8 3.2*  CL 90* 92*  CO2 31 31  BUN 36* 24*  CREATININE 0.63 0.50  GLUCOSE 101* 138*    Recent Labs Lab 06/21/13 1136 06/22/13 0520  HGB 11.7* 10.4*  HCT 34.4* 31.0*  WBC 14.7* 12.0*  PLT 310 276   CXR:  ASSESSMENT / PLAN:  Necrotic right upper lobe mass, likely primary Mediastinal / hilar lymphadenopathy Right upper lobe nodules Subacute healing fracture of the last posterior 8th rib, should consider pathologic fracture Likely undiagnosed COPD Tobacco use disorder  -->  Moderate risk for surgery -->  The mass can be accessed via EBUS, possibly during same procedure -->  Alternatively, it may be reachable vis transthoracic biopsy -->  Bronchodilators PRN -->  Appreciate the consult, will follow  I have personally obtained history, examined patient, evaluated and interpreted laboratory and imaging results, reviewed medical records, formulated assessment / plan and placed orders.  Doree Fudge, MD [facility] [place] Pager: [phone]  06/22/2013, 7:42 AM

## 2013-06-22 NOTE — Transfer of Care (Signed)
Immediate Anesthesia Transfer of Care Note  Patient: Stephanie Oliver  Procedure(s) Performed: Procedure(s): RIGHT HIP GAMMA NAIL FIXATION    (Right) VIDEO BRONCHOSCOPY WITH ENDOBRONCHIAL ULTRASOUND (N/A)  Patient Location: PACU  Anesthesia Type:General  Level of Consciousness: awake, alert  and oriented  Airway & Oxygen Therapy: Patient Spontanous Breathing  Post-op Assessment: Report given to PACU RN  Post vital signs: Reviewed and stable  Complications: No apparent anesthesia complications

## 2013-06-22 NOTE — Op Note (Signed)
DATE OF SURGERY:  06/22/2013  TIME: 11:01 AM  PATIENT NAME:  Stephanie Oliver  AGE: 63 y.o.  PRE-OPERATIVE DIAGNOSIS:  FX RIGHT HIP   POST-OPERATIVE DIAGNOSIS:  SAME  PROCEDURE:  RIGHT HIP GAMMA NAIL FIXATION     SURGEON:  Lavonya Hoerner, D  ASSISTANT:  Joya Gaskins OPA  OPERATIVE IMPLANTS: Stryker Gamma Nail with distal interlock screw  PREOPERATIVE INDICATIONS:  [name] is a 63 y.o. year old who fell and suffered a hip fracture. She was brought into the ER and then admitted and optimized and then elected for surgical intervention.    The risks benefits and alternatives were discussed with the patient including but not limited to the risks of nonoperative treatment, versus surgical intervention including infection, bleeding, nerve injury, malunion, nonunion, hardware prominence, hardware failure, need for hardware removal, blood clots, cardiopulmonary complications, morbidity, mortality, among others, and they were willing to proceed.    OPERATIVE PROCEDURE:  The patient was brought to the operating room and placed in the supine position. General anesthesia was administered, with a foley. She was placed on the fracture table.  Closed reduction was performed under C-arm guidance. The length of the femur was also measured using fluoroscopy. Time out was then performed after sterile prep and drape. She received preoperative antibiotics.  Incision was made proximal to the greater trochanter. A guidewire was placed in the appropriate position. Confirmation was made on AP and lateral views. The above-named nail was opened. I opened the proximal femur with a reamer. I then placed the nail by hand easily down. I did not need to ream the femur.  Once the nail was completely seated, I placed a guidepin into the femoral head into the center center position. I measured the length, and then reamed the lateral cortex and up into the head. I then placed the lag screw. Slight compression was applied.  Anatomic fixation achieved. Bone quality was mediocre.  I then secured the proximal interlocking bolt, and took off a half a turn, and then removed the instruments, and took final C-arm pictures AP and lateral the entire length of the leg.  The knee is perfect circles technique to place a distal interlock screw.   Anatomic reconstruction was achieved, and the wounds were irrigated copiously and closed with Vicryl followed by staples and sterile gauze for the skin. The patient was awakened and returned to PACU in stable and satisfactory condition. There no complications and the patient tolerated the procedure well.  She will be weightbearing as tolerated, and will be on ASA 325  for a period of four weeks after discharge.   Edmonia Lynch, M.D.

## 2013-06-23 ENCOUNTER — Inpatient Hospital Stay (HOSPITAL_COMMUNITY)

## 2013-06-23 ENCOUNTER — Encounter (HOSPITAL_COMMUNITY): Payer: Self-pay | Admitting: Radiology

## 2013-06-23 DIAGNOSIS — I369 Nonrheumatic tricuspid valve disorder, unspecified: Secondary | ICD-10-CM

## 2013-06-23 DIAGNOSIS — G9340 Encephalopathy, unspecified: Secondary | ICD-10-CM

## 2013-06-23 DIAGNOSIS — S72143A Displaced intertrochanteric fracture of unspecified femur, initial encounter for closed fracture: Principal | ICD-10-CM

## 2013-06-23 LAB — POCT I-STAT 3, ART BLOOD GAS (G3+)
Acid-Base Excess: 7 mmol/L — ABNORMAL HIGH (ref 0.0–2.0)
BICARBONATE: 31.9 meq/L — AB (ref 20.0–24.0)
O2 Saturation: 94 %
PCO2 ART: 44.8 mmHg (ref 35.0–45.0)
PO2 ART: 65 mmHg — AB (ref 80.0–100.0)
Patient temperature: 98.2
TCO2: 33 mmol/L (ref 0–100)
pH, Arterial: 7.46 — ABNORMAL HIGH (ref 7.350–7.450)

## 2013-06-23 LAB — BASIC METABOLIC PANEL
BUN: 14 mg/dL (ref 6–23)
CHLORIDE: 97 meq/L (ref 96–112)
CO2: 31 mEq/L (ref 19–32)
Calcium: 8.3 mg/dL — ABNORMAL LOW (ref 8.4–10.5)
Creatinine, Ser: 0.36 mg/dL — ABNORMAL LOW (ref 0.50–1.10)
GFR calc Af Amer: >90 mL/min (ref >90)
GFR calc non Af Amer: >90 mL/min (ref >90)
GLUCOSE: 101 mg/dL — AB (ref 70–99)
POTASSIUM: 3.9 meq/L (ref 3.7–5.3)
Sodium: 137 mEq/L (ref 137–147)

## 2013-06-23 LAB — CBC
HCT: 27.6 % — ABNORMAL LOW (ref 36.0–46.0)
HEMOGLOBIN: 9.2 g/dL — AB (ref 12.0–15.0)
MCH: 34.2 pg — ABNORMAL HIGH (ref 26.0–34.0)
MCHC: 33.3 g/dL (ref 30.0–36.0)
MCV: 102.6 fL — ABNORMAL HIGH (ref 78.0–100.0)
Platelets: 272 10*3/uL (ref 150–400)
RBC: 2.69 MIL/uL — ABNORMAL LOW (ref 3.87–5.11)
RDW: 14.1 % (ref 11.5–15.5)
WBC: 9.7 10*3/uL (ref 4.0–10.5)

## 2013-06-23 LAB — PRO B NATRIURETIC PEPTIDE: PRO B NATRI PEPTIDE: 680.4 pg/mL — AB (ref 0–125)

## 2013-06-23 LAB — TROPONIN I: Troponin I: <0.3 ng/mL (ref <0.30)

## 2013-06-23 MED ORDER — AMIODARONE LOAD VIA INFUSION
150.0000 mg | Freq: Once | INTRAVENOUS | Status: AC
  Administered 2013-06-23: 150 mg via INTRAVENOUS

## 2013-06-23 MED ORDER — IPRATROPIUM-ALBUTEROL 0.5-2.5 (3) MG/3ML IN SOLN
3.0000 mL | RESPIRATORY_TRACT | Status: DC
  Administered 2013-06-23 – 2013-06-25 (×10): 3 mL via RESPIRATORY_TRACT
  Filled 2013-06-23 (×51): qty 3

## 2013-06-23 MED ORDER — AMIODARONE HCL IN DEXTROSE 360-4.14 MG/200ML-% IV SOLN
INTRAVENOUS | Status: AC
  Filled 2013-06-23: qty 200

## 2013-06-23 MED ORDER — BUDESONIDE 0.25 MG/2ML IN SUSP
0.2500 mg | Freq: Two times a day (BID) | RESPIRATORY_TRACT | Status: DC
  Administered 2013-06-23 – 2013-06-29 (×12): 0.25 mg via RESPIRATORY_TRACT
  Filled 2013-06-23 (×15): qty 2

## 2013-06-23 MED ORDER — AMIODARONE HCL IN DEXTROSE 360-4.14 MG/200ML-% IV SOLN
30.0000 mg/h | INTRAVENOUS | Status: DC
  Administered 2013-06-24 (×2): 30 mg/h via INTRAVENOUS
  Filled 2013-06-23 (×4): qty 200

## 2013-06-23 MED ORDER — AMIODARONE HCL IN DEXTROSE 360-4.14 MG/200ML-% IV SOLN
60.0000 mg/h | INTRAVENOUS | Status: AC
  Administered 2013-06-23 (×2): 60 mg/h via INTRAVENOUS
  Filled 2013-06-23: qty 200

## 2013-06-23 MED ORDER — IOHEXOL 300 MG/ML  SOLN
100.0000 mL | Freq: Once | INTRAMUSCULAR | Status: AC | PRN
  Administered 2013-06-23: 100 mL via INTRAVENOUS

## 2013-06-23 MED ORDER — METOPROLOL TARTRATE 1 MG/ML IV SOLN
2.5000 mg | Freq: Four times a day (QID) | INTRAVENOUS | Status: DC
  Administered 2013-06-23 – 2013-06-24 (×3): 2.5 mg via INTRAVENOUS
  Filled 2013-06-23 (×7): qty 5

## 2013-06-23 MED ORDER — DILTIAZEM HCL 100 MG IV SOLR
5.0000 mg/h | INTRAVENOUS | Status: DC
  Administered 2013-06-23 (×2): 5 mg/h via INTRAVENOUS
  Filled 2013-06-23: qty 100

## 2013-06-23 NOTE — Care Management Note (Signed)
    Page 1 of 1   06/23/2013     3:21:42 PM   CARE MANAGEMENT NOTE 06/23/2013  Patient:  Stephanie Oliver, Stephanie Oliver   Account Number:  [id number]  Date Initiated:  06/23/2013  Documentation initiated by:  Luz Lex  Subjective/Objective Assessment:   Stephanie Oliver walking dogs - resulting in hip fx.  Lives at home alone.  Hip repair 06-22-13 - post op ICU -     Action/Plan:   Anticipated DC Date:  06/28/2013   Anticipated DC Plan:  [place] referral  Clinical Social Worker      DC Planning Services  CM consult      Choice offered to / List presented to:             Status of service:  In process, will continue to follow Medicare Important Message given?   (If response is "NO", the following Medicare IM given date fields will be blank) Date Medicare IM given:   Date Additional Medicare IM given:    Discharge Disposition:    Per UR Regulation:  Reviewed for med. necessity/level of care/duration of stay  If discussed at [place] of Stay Meetings, dates discussed:    Comments:  06-23-13 Stephanie Oliver, Stephanie Oliver [phone] Patient sitting up in chair.  Was two patient assist to chair.  Pateint lives with daughter and grandson.  Daughter works in Water quality scientist and she cares for grandson. Understands she may need rehab prior to going home safely to care for grandson as daughter will be unable to be off of work.   CM will continue to follow for progression. - PT/OT to be back to eval.

## 2013-06-23 NOTE — Progress Notes (Signed)
A flutter  Plan cardizem gtt  Dr. Brand Males, M.D., [facility].C.P Pulmonary and Critical Care Medicine Staff Physician [place] [facility] Pager: [phone], If no answer or between  15:00h - 7:00h: call [phone]  06/23/2013 11:08 AM

## 2013-06-23 NOTE — Progress Notes (Signed)
OT Cancellation Note  Patient Details Name: Stephanie Oliver MRN: [id number] DOB: [birth year]   Cancelled Treatment:    Reason Eval/Treat Not Completed: Patient not medically ready (increased resting HR).  Will continue to follow.  Malka So 06/23/2013, 10:44 AM

## 2013-06-23 NOTE — Progress Notes (Signed)
CSW received consult for possible SNF placement. CSW went by room, patient unavailable at this time. Will try again later.  Jeanette Caprice, MSW, [place]

## 2013-06-23 NOTE — Progress Notes (Signed)
Echo completed 06/23/2013 JR

## 2013-06-23 NOTE — Progress Notes (Signed)
Subjective:  Patient reports pain as mild  Objective:   VITALS:   Filed Vitals:   06/23/13 0700 06/23/13 0725 06/23/13 0739 06/23/13 0800  BP: 125/77   126/76  Pulse: 90   94  Temp:  98.2 F (36.8 C)    TempSrc:  Oral    Resp: 15  16 21   Height:      Weight:      SpO2: 97%  97% 97%    Physical Exam RLE:   Dressing: C/D/I  Compartments soft  SILT DP/SP/S/S/T, 2+DP, +TA/GS/EHL  LABS  Results for orders placed during the hospital encounter of 06/21/13 (from the past 24 hour(s))  POTASSIUM     Status: None   Collection Time    06/22/13  3:31 PM      Result Value Range   Potassium 3.9  3.7 - 5.3 mEq/L  CBC     Status: Abnormal   Collection Time    06/23/13  3:35 AM      Result Value Range   WBC 9.7  4.0 - 10.5 K/uL   RBC 2.69 (*) 3.87 - 5.11 MIL/uL   Hemoglobin 9.2 (*) 12.0 - 15.0 g/dL   HCT 27.6 (*) 36.0 - 46.0 %   MCV 102.6 (*) 78.0 - 100.0 fL   MCH 34.2 (*) 26.0 - 34.0 pg   MCHC 33.3  30.0 - 36.0 g/dL   RDW 14.1  11.5 - 15.5 %   Platelets 272  150 - 400 K/uL  BASIC METABOLIC PANEL     Status: Abnormal   Collection Time    06/23/13  3:35 AM      Result Value Range   Sodium 137  137 - 147 mEq/L   Potassium 3.9  3.7 - 5.3 mEq/L   Chloride 97  96 - 112 mEq/L   CO2 31  19 - 32 mEq/L   Glucose, Bld 101 (*) 70 - 99 mg/dL   BUN 14  6 - 23 mg/dL   Creatinine, Ser 0.36 (*) 0.50 - 1.10 mg/dL   Calcium 8.3 (*) 8.4 - 10.5 mg/dL   GFR calc non Af Amer >90  >90 mL/min   GFR calc Af Amer >90  >90 mL/min  POCT I-STAT 3, BLOOD GAS (G3+)     Status: Abnormal   Collection Time    06/23/13  9:17 AM      Result Value Range   pH, Arterial 7.460 (*) 7.350 - 7.450   pCO2 arterial 44.8  35.0 - 45.0 mmHg   pO2, Arterial 65.0 (*) 80.0 - 100.0 mmHg   Bicarbonate 31.9 (*) 20.0 - 24.0 mEq/L   TCO2 33  0 - 100 mmol/L   O2 Saturation 94.0     Acid-Base Excess 7.0 (*) 0.0 - 2.0 mmol/L   Patient temperature 98.2 F     Collection site ARTERIAL LINE     Drawn by Operator     Sample type ARTERIAL       Assessment/Plan: 1 Day Post-Op   Active Problems:   Intertrochanteric fracture of right hip   Alcohol abuse, continuous   Hypertension   Hip fracture   Lung mass   Mediastinal lymphadenopathy   COPD (chronic obstructive pulmonary disease)   Tobacco use disorder   Encephalopathy acute   PLAN: Weight Bearing: WBAT, ROM AT Dressings: change dressing on Friday and again every 3 days to a dry dressing VTE prophylaxis: scd's, TED's and ASA 325. There is no orthopedic contraindication to increasing  chemical prophylaxis so I will defer to hospitalist and Pulmonary if they feel that increased px is indicated.  Dispo: Per primary team, follow up with me in 2wks   [place], D 06/23/2013, 9:32 AM   Edmonia Lynch, MD Cell [phone]

## 2013-06-23 NOTE — Progress Notes (Signed)
PT Cancellation Note  Patient Details Name: Adaya Garmany MRN: [id number] DOB: [birth year]   Cancelled Treatment:    Reason Eval Not Completed: Patient not medically ready. HR currently 154 at rest. In ICU. Will attempt to see 06/24/13 if medically appropriate   Hristopher Missildine 06/23/2013, 9:19 AM Pager [phone]

## 2013-06-23 NOTE — Progress Notes (Addendum)
PULMONARY / CRITICAL CARE MEDICINE  Name: Stephanie Oliver MRN: [id number] DOB: [birth year]    ADMISSION DATE:  06/21/2013 CONSULTATION DATE: 06/21/2013  REFERRING MD :  [facility] PRIMARY SERVICE:  TRH  CHIEF COMPLAINT:  Lung mass  BRIEF PATIENT DESCRIPTION: 63 yo active smoker with reported weight loss since 2 years ago admitted for hip fracture and noted to have large rite sided lung mass.   LINE / TUBES:  CULTURES:  ANTIBIOTICS: Anti-infectives   Start     Dose/Rate Route Frequency Ordered Stop   06/22/13 1830  clindamycin (CLEOCIN) IVPB 600 mg     600 mg 100 mL/hr over 30 Minutes Intravenous Every 6 hours 06/22/13 1642 06/23/13 0514   06/22/13 0600  clindamycin (CLEOCIN) IVPB 900 mg  Status:  Discontinued     900 mg 100 mL/hr over 30 Minutes Intravenous On call to O.R. 06/21/13 1839 06/22/13 1642       SIGNIFICANT EVENTS / STUDIES:  1/5  Head CT >>>  No acute displaced skull fracture or findings to suggest acute intracranial trauma. Chronic microvascular ischemic changes in the cerebral white matter, as above.  1/5  Chest CT >>>  7.2 x 5.0 x 6.4 cm necrotic right upper lobe mass, most consistent with primary lung malignancy. The mass appears to be locally invasive into the anterior mediastinum medially. Enlarged necrotic right hilar lymph node with smaller precarinal  node as above, worrisome for nodal metastasis. Additional subcentimeter pulmonary nodules within the right upper lobe as above, worrisome for malignancy as well. The subacute healing fracture of the left posterolateral 8th rib. Hepatic steatosis. .... 06/22/13 - EBUS RB4 mass,   RML BAL,  Endobronchial bx 06/22/13 - RIGHT HIP GAMMA NAIL FIXATION . BX sent    SUBJECTIVE:   06/23/13 - events yesteerday noted. Currently on CIWA protocol with ativan and confused. In A flutter HR 140. BP ok. On iv llopressors prn. Trop pending.Not intubated. Not on pressors  VITAL SIGNS: Temp:  [96 F (35.6 C)-98.2 F (36.8 C)] 98.2 F (36.8  C) (01/07 0725) Pulse Rate:  [74-137] 94 (01/07 0800) Resp:  [15-27] 21 (01/07 0800) BP: (93-156)/(54-94) 126/76 mmHg (01/07 0800) SpO2:  [82 %-100 %] 97 % (01/07 0800)  PHYSICAL EXAMINATION: General:  Resting comfortably, appears to be in no acute distress Neuro:  Awake, follows commands, but confused, unable to provide detailed history HEENT:  NCAT, PERRL, moist membranes Neck:  Soft, no bruits, no lymphadenopathy, no carotid btuits Cardiovascular:  RRR, no m/r/g Lungs:  Bilateral air entry, diminished R side Abdomen:  Soft, nontender, bowel sounds present, no organomegaly Musculoskeletal:  Moves all extremities, no edema, no digital clubbing Skin:  Intact, no rash   LABS: PULMONARY No results found for this basename: PHART, PCO2, PCO2ART, PO2, PO2ART, HCO3, TCO2, O2SAT,  in the last 168 hours  CBC  Recent Labs Lab 06/21/13 1136 06/22/13 0520 06/23/13 0335  HGB 11.7* 10.4* 9.2*  HCT 34.4* 31.0* 27.6*  WBC 14.7* 12.0* 9.7  PLT 310 276 272    COAGULATION  Recent Labs Lab 06/21/13 1136  INR 0.93    CARDIAC  No results found for this basename: TROPONINI,  in the last 168 hours No results found for this basename: PROBNP,  in the last 168 hours   CHEMISTRY  Recent Labs Lab 06/21/13 1136 06/22/13 0520 06/22/13 1531 06/23/13 0335  NA 135* 134*  --  137  K 3.8 3.2* 3.9 3.9  CL 90* 92*  --  97  CO2 31 31  --  31  GLUCOSE 101* 138*  --  101*  BUN 36* 24*  --  14  CREATININE 0.63 0.50  --  0.36*  CALCIUM 9.6 8.7  --  8.3*   Estimated Creatinine Clearance: 58.8 ml/min (by C-G formula based on Cr of 0.36).   LIVER  Recent Labs Lab 06/21/13 1136  INR 0.93     INFECTIOUS No results found for this basename: LATICACIDVEN, PROCALCITON,  in the last 168 hours   ENDOCRINE CBG (last 3)  No results found for this basename: GLUCAP,  in the last 72 hours       IMAGING x48h  Dg Hip Complete Right  06/21/2013   CLINICAL DATA:  Right hip pain  secondary to a fall 2 days ago.  EXAM: RIGHT HIP - COMPLETE 2+ VIEW  COMPARISON:  None.  FINDINGS: There is an angulated comminuted slightly impacted intertrochanteric fracture of the proximal right femur. Lesser trochanter is displaced. 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  Pelvic bones are intact.  IMPRESSION: Comminuted intertrochanteric fracture of the proximal right femur.   Electronically Signed   By: Rozetta Nunnery M.D.   On: 06/21/2013 11:26   Dg Femur Right  06/22/2013   CLINICAL DATA:  Right femur fracture.  EXAM: RIGHT FEMUR - 2 VIEW; DG C-ARM 1-60 MIN  COMPARISON:  Radiographs dated 06/21/2013  FINDINGS: C-arm images demonstrate insertion of intra medullary nail and compression screw across the intertrochanteric fracture of the proximal right femur. Alignment and position of the fracture fragments is near anatomic. Hardware appears in good position.  IMPRESSION: Open reduction and internal fixation of intertrochanteric fracture of the proximal right femur.   Electronically Signed   By: Rozetta Nunnery M.D.   On: 06/22/2013 13:08   Ct Head Wo Contrast  06/21/2013   CLINICAL DATA:  History of trauma from a fall. Right leg pain and weakness.  EXAM: CT HEAD WITHOUT CONTRAST  TECHNIQUE: Contiguous axial images were obtained from the base of the skull through the vertex without intravenous contrast.  COMPARISON:  No priors.  FINDINGS: Patchy and confluent areas of decreased attenuation are noted throughout the deep and periventricular white matter of the cerebral hemispheres bilaterally, compatible with chronic microvascular ischemic disease. No acute displaced skull fractures are identified. No acute intracranial abnormality. Specifically, no evidence of acute post-traumatic intracranial hemorrhage, no definite regions of acute/subacute cerebral ischemia, no focal mass, mass effect, hydrocephalus or abnormal intra or extra-axial fluid collections. The visualized paranasal sinuses and  mastoids are well pneumatized.  IMPRESSION: 1. No acute displaced skull fracture or findings to suggest acute intracranial trauma. 2. Chronic microvascular ischemic changes in the cerebral white matter, as above.   Electronically Signed   By: Vinnie Langton M.D.   On: 06/21/2013 12:11   Ct Chest W Contrast  06/21/2013   CLINICAL DATA:  Paratracheal mass  EXAM: CT CHEST WITH CONTRAST  TECHNIQUE: Multidetector CT imaging of the chest was performed during intravenous contrast administration.  CONTRAST:  One daughter cc of Omnipaque 300  COMPARISON:  Prior radiograph performed earlier on the same date.  FINDINGS: The visualized thyroid is within normal limits.  Aorta and great vessels are of normal caliber. Prominent atherosclerotic calcifications noted at the origin of the great vessels.  Heart size is within normal limits.  No pericardial effusion.  A large heterogeneous mass measuring 7.2 x 5.0 x 6.4 cm is seen within the medial aspect of the right upper lobe (series 2, image 20).  Central hypodensity seen within this lesion is most consistent with necrosis. The mass abuts and invades the anterior mediastinum medially, and anterior medial chest wall anteriorly. There is secondary mass effect on knee adjacent SVC. Right upper lobe segmental pulmonary arteries are attenuated at the level of the mass. There is associated atelectasis within the right upper lobe Finding is highly concerning for bronchogenic carcinoma.  An adjacent satellite nodule measuring 6 mm is seen at the lateral aspect of the primary mass lesion (series 3, image 20). Additional subpleural nodules measuring up to 7 mm are seen more peripherally within the right upper lobe (series 3, image 23, 24). Additional dense 7 mm nodule seen more superiorly near the right lung apex (series 3, image 11).  No focal infiltrate or pleural effusion.  A large necrotic right hilar lymph node measuring 4.0 x 4.6 x 4.3 cm is present (series 2, image 27). This node  exerts mass effect on the right main pulmonary posteriorly which is slightly narrowed. The root right pulmonary artery and its branches are opacified distally. A 2nd 1 cm necrotic precarinal node is also present (series 2, image 22).  Diffuse hypoattenuation is seen within liver, suggestive of steatosis. Partially visualized upper abdomen is otherwise unremarkable.  Remote left 8th rib fracture is noted (series 2, image 36). No acute osseous abnormality. No focal lytic or blastic osseous lesions.  IMPRESSION: 1. 7.2 x 5.0 x 6.4 cm necrotic right upper lobe mass, most consistent with primary lung malignancy. The mass appears to be locally invasive into the anterior mediastinum medially. 2. Enlarged necrotic right hilar lymph node with smaller precarinal node as above, worrisome for nodal metastasis. 3. Additional subcentimeter pulmonary nodules within the right upper lobe as above, worrisome for malignancy as well. 4. The subacute healing fracture of the left posterolateral 8th rib. 5. Hepatic steatosis.   Electronically Signed   By: Jeannine Boga M.D.   On: 06/21/2013 16:40   Dg Chest Port 1 View  06/21/2013   CLINICAL DATA:  Preop for hip fracture.  Slight cough.  EXAM: PORTABLE CHEST - 1 VIEW  COMPARISON:  None.  FINDINGS: The heart size is normal. A right peritracheal mass measures 5.3 x 7.0 cm. Other smaller right-sided nodules are evident. A 2 cm left pulmonary nodule is suggested. There is no edema or effusion to suggest failure. The right hemidiaphragm is elevated.  No acute focal airspace disease is evident.  IMPRESSION: 1. 7 cm right peritracheal mass. Recommend CT of the chest with contrast for further evaluation. 2. Additional smaller nodules are suggested, including a 2 cm left lower lobe nodule.   Electronically Signed   By: Lawrence Santiago M.D.   On: 06/21/2013 15:10   Dg C-arm 1-60 Min  06/22/2013   CLINICAL DATA:  Right femur fracture.  EXAM: RIGHT FEMUR - 2 VIEW; DG C-ARM 1-60 MIN   COMPARISON:  Radiographs dated 06/21/2013  FINDINGS: C-arm images demonstrate insertion of intra medullary nail and compression screw across the intertrochanteric fracture of the proximal right femur. Alignment and position of the fracture fragments is near anatomic. Hardware appears in good position.  IMPRESSION: Open reduction and internal fixation of intertrochanteric fracture of the proximal right femur.   Electronically Signed   By: Rozetta Nunnery M.D.   On: 06/22/2013 13:08       ASSESSMENT / PLAN:  PULMONARY A: -  RUL Mass with mediastinal nodes.  S/p EBUS 06/22/13 - s/p Post op Intubation 06/22/13 P:   Monitor for  post op resp failure Await lung bx Nebs  CARDIOVASCULAR A: A flutter 06/23/13 P:  Lopresssor prn' Check bnp lovenox dvt proph dose Check echo Consider cardizem gtt   RENAL A:  Normal renal function P:   monitor  GASTROINTESTINAL A:  Altered mejntal status P:   Keep NPO  HEMATOLOGIC A:  Post op anemia  P:  PRBC for hgb < 7gm% only  INFECTIOUS A:  No evidence of inection P:   Clinda per ortho  ENDOCRINE A:  ? DM  P:   monitor  NEUROLOGIC A:  Acute encephalopathy - hypoactive. On CIWA protocol. At risk for etoh delirium P:   ciwa protcol Use precedex if wosens  MSK A Hip # P: await bonne bx  TODAY'S SUMMARY: Keep in ICU. No family at bedside. Tx to PCCM serrvice. Await bx results. Intubate if worsens    The patient is critically ill with multiple organ systems failure and requires high complexity decision making for assessment and support, frequent evaluation and titration of therapies, application of advanced monitoring technologies and extensive interpretation of multiple databases.   Critical Care Time devoted to patient care services described in this note is  35  Minutes.   Mural RamaswamyMD Pulmonary and [facility]  [phone]  06/23/2013 8:49  AM

## 2013-06-23 NOTE — Significant Event (Signed)
Recurrent AFRVR with borderline low BPs   Amiodarone ordered DC diltiazem after amiodarone started   Merton Border, MD ; [facility] [phone].  After 5:30 PM or weekends, call [phone]

## 2013-06-23 NOTE — Anesthesia Postprocedure Evaluation (Signed)
  Anesthesia Post-op Note  Patient: Stephanie Oliver  Procedure(s) Performed: Procedure(s): RIGHT HIP GAMMA NAIL FIXATION    (Right) VIDEO BRONCHOSCOPY WITH ENDOBRONCHIAL ULTRASOUND (N/A)  Patient Location: SICU  Anesthesia Type:General  Level of Consciousness: awake, alert  and patient cooperative  Airway and Oxygen Therapy: Patient Spontanous Breathing and Patient connected to nasal cannula oxygen  Post-op Pain: mild  Post-op Assessment: Post-op Vital signs reviewed, Patient's Cardiovascular Status Stable, Respiratory Function Stable, Patent Airway and No signs of Nausea or vomiting  Post-op Vital Signs: Reviewed and stable  Complications: No apparent anesthesia complications

## 2013-06-23 NOTE — Progress Notes (Signed)
Clinical Social Work Department BRIEF PSYCHOSOCIAL ASSESSMENT 06/23/2013  Patient:  Stephanie Oliver, Stephanie Oliver     Account Number:  [id number]     Admit date:  06/21/2013  Clinical Social Worker:  Megan Salon  Date/Time:  06/23/2013 02:22 PM  Referred by:  Physician  Date Referred:  06/23/2013 Referred for  SNF Placement   Other Referral:   Interview type:  Family Other interview type:   CSW spoke to patient's daughter over the phone    PSYCHOSOCIAL DATA Living Status:  ALONE Admitted from facility:   Level of care:   Primary support name:  Rochel Privett [phone] Primary support relationship to patient:  CHILD, ADULT Degree of support available:   Good    CURRENT CONCERNS Current Concerns  Post-Acute Placement   Other Concerns:    SOCIAL WORK ASSESSMENT / PLAN Clinical Social Worker received referral for SNF placement at d/c. CSW went by room, patient sleeping. Per nurse, daughter just left. CSW called daughter and introduced self and explained reason for call. Patient's daughter receptive to Education officer, museum call.  CSW explained that the physician recommended SNF for patient, but are still waiting for PT evaluation. CSW asked patient's daughter what her thoughts were on SNF and how the patient may feel about SNF. Patient's daughter stated that she thinks that rehab would be the best for her mom, but does not think that her mom would want to go to SNF. CSW explained to daughter that we will wait until PT evals and patient is able to speak about SNF. Daughter agreed and thanked Education officer, museum for the phone call,   Assessment/plan status:  Psychosocial Support/Ongoing Assessment of Needs Other assessment/ plan:   Information/referral to community resources:   CSW contact information    PATIENT'S/FAMILY'S RESPONSE TO PLAN OF CARE: Patient sleeping, patient's daughter is agreeable to SNF, but wants to speak to the patient about it. CSW will attempt to speak to patient tomorrow.        Jeanette Caprice, MSW, [place]

## 2013-06-24 DIAGNOSIS — F17200 Nicotine dependence, unspecified, uncomplicated: Secondary | ICD-10-CM

## 2013-06-24 DIAGNOSIS — I480 Paroxysmal atrial fibrillation: Secondary | ICD-10-CM | POA: Diagnosis not present

## 2013-06-24 DIAGNOSIS — C3490 Malignant neoplasm of unspecified part of unspecified bronchus or lung: Secondary | ICD-10-CM

## 2013-06-24 DIAGNOSIS — J449 Chronic obstructive pulmonary disease, unspecified: Secondary | ICD-10-CM

## 2013-06-24 DIAGNOSIS — F1021 Alcohol dependence, in remission: Secondary | ICD-10-CM

## 2013-06-24 DIAGNOSIS — C3491 Malignant neoplasm of unspecified part of right bronchus or lung: Secondary | ICD-10-CM | POA: Diagnosis present

## 2013-06-24 DIAGNOSIS — J4489 Other specified chronic obstructive pulmonary disease: Secondary | ICD-10-CM

## 2013-06-24 DIAGNOSIS — I4891 Unspecified atrial fibrillation: Secondary | ICD-10-CM

## 2013-06-24 DIAGNOSIS — C3410 Malignant neoplasm of upper lobe, unspecified bronchus or lung: Secondary | ICD-10-CM

## 2013-06-24 LAB — CBC WITH DIFFERENTIAL/PLATELET
Basophils Absolute: 0 10*3/uL (ref 0.0–0.1)
Basophils Relative: 0 % (ref 0–1)
Eosinophils Absolute: 0.1 10*3/uL (ref 0.0–0.7)
Eosinophils Relative: 1 % (ref 0–5)
HCT: 26.4 % — ABNORMAL LOW (ref 36.0–46.0)
Hemoglobin: 8.7 g/dL — ABNORMAL LOW (ref 12.0–15.0)
LYMPHS ABS: 1.2 10*3/uL (ref 0.7–4.0)
LYMPHS PCT: 11 % — AB (ref 12–46)
MCH: 33.6 pg (ref 26.0–34.0)
MCHC: 33 g/dL (ref 30.0–36.0)
MCV: 101.9 fL — ABNORMAL HIGH (ref 78.0–100.0)
MONOS PCT: 19 % — AB (ref 3–12)
Monocytes Absolute: 2.1 10*3/uL — ABNORMAL HIGH (ref 0.1–1.0)
NEUTROS ABS: 7.5 10*3/uL (ref 1.7–7.7)
NEUTROS PCT: 69 % (ref 43–77)
PLATELETS: 264 10*3/uL (ref 150–400)
RBC: 2.59 MIL/uL — AB (ref 3.87–5.11)
RDW: 14.1 % (ref 11.5–15.5)
WBC: 10.9 10*3/uL — AB (ref 4.0–10.5)

## 2013-06-24 LAB — MAGNESIUM: Magnesium: 1.4 mg/dL — ABNORMAL LOW (ref 1.5–2.5)

## 2013-06-24 LAB — BASIC METABOLIC PANEL
BUN: 9 mg/dL (ref 6–23)
CHLORIDE: 97 meq/L (ref 96–112)
CO2: 30 mEq/L (ref 19–32)
CREATININE: 0.34 mg/dL — AB (ref 0.50–1.10)
Calcium: 8.3 mg/dL — ABNORMAL LOW (ref 8.4–10.5)
GFR calc non Af Amer: >90 mL/min (ref >90)
Glucose, Bld: 120 mg/dL — ABNORMAL HIGH (ref 70–99)
Potassium: 3.6 mEq/L — ABNORMAL LOW (ref 3.7–5.3)
Sodium: 138 mEq/L (ref 137–147)

## 2013-06-24 LAB — PRO B NATRIURETIC PEPTIDE: PRO B NATRI PEPTIDE: 995 pg/mL — AB (ref 0–125)

## 2013-06-24 LAB — TROPONIN I

## 2013-06-24 LAB — PHOSPHORUS: PHOSPHORUS: 2.1 mg/dL — AB (ref 2.3–4.6)

## 2013-06-24 MED ORDER — METOPROLOL SUCCINATE ER 100 MG PO TB24
100.0000 mg | ORAL_TABLET | Freq: Every day | ORAL | Status: DC
  Administered 2013-06-24 – 2013-06-26 (×3): 100 mg via ORAL
  Filled 2013-06-24 (×4): qty 1

## 2013-06-24 MED ORDER — AMIODARONE HCL 200 MG PO TABS: 200.0000 mg | ORAL_TABLET | Freq: Every day | ORAL | Status: DC

## 2013-06-24 MED ORDER — SODIUM CHLORIDE 0.9 % IJ SOLN
10.0000 mL | INTRAMUSCULAR | Status: DC | PRN
  Administered 2013-06-25 (×2): 20 mL
  Administered 2013-06-25 – 2013-06-29 (×6): 10 mL

## 2013-06-24 MED ORDER — SODIUM CHLORIDE 0.9 % IJ SOLN
10.0000 mL | Freq: Two times a day (BID) | INTRAMUSCULAR | Status: DC
  Administered 2013-06-24 (×2): 10 mL
  Administered 2013-06-25: 20 mL

## 2013-06-24 MED ORDER — AMIODARONE HCL 200 MG PO TABS
200.0000 mg | ORAL_TABLET | Freq: Two times a day (BID) | ORAL | Status: DC
  Administered 2013-06-24 – 2013-06-27 (×7): 200 mg via ORAL
  Filled 2013-06-24 (×10): qty 1

## 2013-06-24 MED ORDER — AMIODARONE IV BOLUS ONLY 150 MG/100ML
150.0000 mg | Freq: Once | INTRAVENOUS | Status: AC
Start: 2013-06-24 — End: 2013-06-24
  Administered 2013-06-24: 150 mg via INTRAVENOUS

## 2013-06-24 NOTE — Progress Notes (Signed)
Received report from Lauren, RN. 

## 2013-06-24 NOTE — Progress Notes (Addendum)
PULMONARY / CRITICAL CARE MEDICINE  Name: Stephanie Oliver MRN: [id number] DOB: [birth year]    ADMISSION DATE:  06/21/2013 CONSULTATION DATE: 06/21/2013  REFERRING MD :  [facility] PRIMARY SERVICE:  TRH  CHIEF COMPLAINT:  Lung mass  BRIEF PATIENT DESCRIPTION: 63 yo active smoker with reported weight loss since 2 years ago admitted for hip fracture and noted to have large rite sided lung mass.   LINE / TUBES: PICC LUE 1/8  CULTURES: None  ANTIBIOTICS: Anti-infectives   Start     Dose/Rate Route Frequency Ordered Stop   06/22/13 1830  clindamycin (CLEOCIN) IVPB 600 mg     600 mg 100 mL/hr over 30 Minutes Intravenous Every 6 hours 06/22/13 1642 06/23/13 0514   06/22/13 0600  clindamycin (CLEOCIN) IVPB 900 mg  Status:  Discontinued     900 mg 100 mL/hr over 30 Minutes Intravenous On call to O.R. 06/21/13 1839 06/22/13 1642       SIGNIFICANT EVENTS / STUDIES:  1/5  Head CT >>>  No acute displaced skull fracture or findings to suggest acute intracranial trauma. Chronic microvascular ischemic changes in the cerebral white matter, as above.  1/5  Chest CT >>>  7.2 x 5.0 x 6.4 cm necrotic right upper lobe mass, most consistent with primary lung malignancy. The mass appears to be locally invasive into the anterior mediastinum medially. Enlarged necrotic right hilar lymph node with smaller precarinal  node as above, worrisome for nodal metastasis. Additional subcentimeter pulmonary nodules within the right upper lobe as above, worrisome for malignancy as well. The subacute healing fracture of the left posterolateral 8th rib. Hepatic steatosis. .... 06/22/13 - EBUS RB4 mass,   RML BAL,  Endobronchial bx 06/22/13 - RIGHT HIP GAMMA NAIL FIXATION . BX sent    SUBJECTIVE:  Doing well , on amio drip  VITAL SIGNS: Temp:  [97.5 F (36.4 C)-98.1 F (36.7 C)] 98 F (36.7 C) (01/08 0732) Pulse Rate:  [77-139] 97 (01/08 1100) Resp:  [15-29] 24 (01/08 1100) BP: (80-143)/(42-85) 128/85 mmHg (01/08  0900) SpO2:  [92 %-100 %] 100 % (01/08 1100)  PHYSICAL EXAMINATION: General:  Resting comfortably, appears to be in no acute distress Neuro:  Awake, follows commands, but confused, unable to provide detailed history HEENT:  NCAT, PERRL, moist membranes Neck:  Soft, no bruits, no lymphadenopathy, no carotid btuits Cardiovascular:  RRR, no m/r/g Lungs:  Bilateral air entry, diminished R side Abdomen:  Soft, nontender, bowel sounds present, no organomegaly Musculoskeletal:  Moves all extremities, no edema, no digital clubbing Skin:  Intact, no rash   LABS: PULMONARY  Recent Labs Lab 06/23/13 0917  PHART 7.460*  PCO2ART 44.8  PO2ART 65.0*  HCO3 31.9*  TCO2 33  O2SAT 94.0    CBC  Recent Labs Lab 06/22/13 0520 06/23/13 0335 06/24/13 0309  HGB 10.4* 9.2* 8.7*  HCT 31.0* 27.6* 26.4*  WBC 12.0* 9.7 10.9*  PLT 276 272 264    COAGULATION  Recent Labs Lab 06/21/13 1136  INR 0.93    CARDIAC    Recent Labs Lab 06/23/13 0830 06/23/13 1544 06/24/13 0125  TROPONINI <0.30 <0.30 <0.30    Recent Labs Lab 06/23/13 0830 06/24/13 0309  PROBNP 680.4* 995.0*     CHEMISTRY  Recent Labs Lab 06/21/13 1136 06/22/13 0520  06/23/13 0335 06/24/13 0309  NA 135* 134*  --  137 138  K 3.8 3.2*  < > 3.9 3.6*  CL 90* 92*  --  97 97  CO2 31 31  --  31 30  GLUCOSE 101* 138*  --  101* 120*  BUN 36* 24*  --  14 9  CREATININE 0.63 0.50  --  0.36* 0.34*  CALCIUM 9.6 8.7  --  8.3* 8.3*  MG  --   --   --   --  1.4*  PHOS  --   --   --   --  2.1*  < > = values in this interval not displayed. Estimated Creatinine Clearance: 58.8 ml/min (by C-G formula based on Cr of 0.34).   LIVER  Recent Labs Lab 06/21/13 1136  INR 0.93     INFECTIOUS No results found for this basename: LATICACIDVEN, PROCALCITON,  in the last 168 hours   ENDOCRINE CBG (last 3)  No results found for this basename: GLUCAP,  in the last 72 hours       IMAGING x48h  Dg Femur  Right  06/22/2013   CLINICAL DATA:  Right femur fracture.  EXAM: RIGHT FEMUR - 2 VIEW; DG C-ARM 1-60 MIN  COMPARISON:  Radiographs dated 06/21/2013  FINDINGS: C-arm images demonstrate insertion of intra medullary nail and compression screw across the intertrochanteric fracture of the proximal right femur. Alignment and position of the fracture fragments is near anatomic. Hardware appears in good position.  IMPRESSION: Open reduction and internal fixation of intertrochanteric fracture of the proximal right femur.   Electronically Signed   By: Rozetta Nunnery M.D.   On: 06/22/2013 13:08   Ct Head W Wo Contrast  06/23/2013   CLINICAL DATA:  Encephalopathy. Lung mass. Rule out metastatic disease.  EXAM: CT HEAD WITHOUT AND WITH CONTRAST  TECHNIQUE: Contiguous axial images were obtained from the base of the skull through the vertex without and with intravenous contrast  CONTRAST:  117mL OMNIPAQUE IOHEXOL 300 MG/ML  SOLN  COMPARISON:  CT head 06/21/2013  FINDINGS: Mild atrophy. Negative for hydrocephalus. Patchy hypodensity throughout the cerebral white matter bilaterally does not enhance and is most consistent with chronic microvascular ischemia.  Negative for acute infarct.  Negative for hemorrhage.  Postcontrast imaging reveals no enhancing lesions. No evidence of metastatic disease.  Calvarium is negative  IMPRESSION: Atrophy and chronic microvascular ischemic change in the white matter.  Negative for metastatic disease.  No acute abnormality.   Electronically Signed   By: Franchot Gallo M.D.   On: 06/23/2013 10:57   Dg C-arm 1-60 Min  06/22/2013   CLINICAL DATA:  Right femur fracture.  EXAM: RIGHT FEMUR - 2 VIEW; DG C-ARM 1-60 MIN  COMPARISON:  Radiographs dated 06/21/2013  FINDINGS: C-arm images demonstrate insertion of intra medullary nail and compression screw across the intertrochanteric fracture of the proximal right femur. Alignment and position of the fracture fragments is near anatomic. Hardware appears in  good position.  IMPRESSION: Open reduction and internal fixation of intertrochanteric fracture of the proximal right femur.   Electronically Signed   By: Rozetta Nunnery M.D.   On: 06/22/2013 13:08       ASSESSMENT / PLAN: Principal Problem:   Intertrochanteric fracture of right hip Active Problems:   Alcohol abuse, continuous   Hypertension   Hip fracture   Non-small cell carcinoma of lung   Mediastinal lymphadenopathy   COPD (chronic obstructive pulmonary disease)   Tobacco use disorder   Encephalopathy acute   PAF (paroxysmal atrial fibrillation)   PULMONARY A: -  RUL Mass with mediastinal nodes.  S/p EBUS 06/22/13>>> Non small cell CA  - s/p Post op Intubation 06/22/13 P:  Monitor for post op resp failure Oncology consult called  CARDIOVASCULAR A: A flutter 06/23/13>>NSR with amio, did not tol cardiazem ECHO EF 60% LVH P:  Lopresssor prn Check bnp>>900 lovenox dvt proph dose Change amio to po   RENAL A:  Normal renal function P:   monitor  GASTROINTESTINAL A:  More alert , ok for POS P:   Adv diet  HEMATOLOGIC A:  Post op anemia  P:  PRBC for hgb < 7gm% only  INFECTIOUS A:  No evidence of inection P:   Off ABX  ENDOCRINE A:  ? DM  P:   monitor  NEUROLOGIC A:  Acute encephalopathy - hypoactive. On CIWA protocol. At risk for etoh delirium >>better P:   Monitor  MSK A Hip # P: bone bx NEG for CA  TODAY'S SUMMARY: tfr to tele. Lung CA s/p ORIF hip fx.  Onc consult called   The patient is critically ill with multiple organ systems failure and requires high complexity decision making for assessment and support, frequent evaluation and titration of therapies, application of advanced monitoring technologies and extensive interpretation of multiple databases.   Critical Care Time devoted to patient care services described in this note is  35  Minutes.  Mariel Sleet Beeper  [phone]  Cell  [phone]  If no response or cell goes to  voicemail, call beeper [phone]  06/24/2013 11:07 AM

## 2013-06-24 NOTE — Consult Note (Signed)
[place]  Telephone:([phone]) [place] NOTE  Vega Stare                                MR#: [id number]  DOB: [birth year]                       CSN#: [id number]  Referring MD:  Asencion Noble, MD  Primary MD: Dr. Marlynn Perking  Reason for Consult: Lung Cancer   PXT:GGYIR Stephanie Oliver is a 63 y.o. white  female lifelong smoker with COPD and a long history of alcohol abuse,  asked to seeing for evaluation of non-small cell lung carcinoma. In review, she was admitted to the hospital after experiencing a Comminuted intertrochanteric fracture of the proximal right femur  after a mechanic fall requiring  gamma nail fixation.on 06/22/2013 (sample was sent to pathology as well).  CT of the head without contrast revealed non-small fractures, or any other abnormalities . Chest x-ray was performed on admission revealing 1. 7 cm right peritracheal mass suspicious for malignancy, along with additional smaller nodules including a 2 cm left lower lobe node . Followup CT of the chest with contrast on 06/21/2013 confirmed a 7.2 x 5.0 x 6.4 cm necrotic right upper lobe mass, most consistent with primary lung malignancy, locally invasive into the anterior mediastinum medially . Enlarged necrotic right hilar lymph node of 4.0 x 4.6 x 4.3 cm  with smaller precarinal node was seen, worrisome for nodal metastasis.  Additional subcentimeter pulmonary nodules within the right upper lobe as above were noted . Subacute healing fracture of the left posterolateral 8th rib was seen. No focal lytic or blastic osseous lesions. Hepatic steatosis was observed. She had a pulmonary evaluation, performed on 06/22/2013 EBUS of the right bronchial mass, right middle lobe and endobronchial area. This was done at the time of the right hip gamma nail fixation.We were requested to see this patient with recommendations.        PMH:  Past Medical History  Diagnosis Date  . Depression   . Panic attacks     . Hypertension   . Smoker   . Alcohol abuse     Surgeries:  History reviewed. No pertinent past surgical history.  Allergies:  Allergies  Allergen Reactions  . Lisinopril Diarrhea and Nausea Only  . Paxil [Paroxetine] Diarrhea and Nausea Only  . Penicillins Hives    Medications:   . amiodarone  200 mg Oral Q12H   Followed by  . [START ON 07/01/2013] amiodarone  200 mg Oral Daily  . aspirin EC  325 mg Oral Q breakfast  . budesonide  0.25 mg Nebulization BID  . Chlorhexidine Gluconate Cloth  6 each Topical Daily  . docusate sodium  100 mg Oral BID  . enoxaparin (LOVENOX) injection  40 mg Subcutaneous Q24H  . folic acid  1 mg Oral Daily  . ipratropium-albuterol  3 mL Nebulization Q4H  . metoprolol succinate  100 mg Oral Daily  . multivitamin with minerals  1 tablet Oral Daily  . mupirocin ointment  1 application Nasal BID  . sertraline  100 mg Oral Daily  . sodium chloride  10-40 mL Intracatheter Q12H  . thiamine  100 mg Oral Daily   Or  . thiamine  100 mg Intravenous Daily    SWN:[id number], acetaminophen, HYDROcodone-acetaminophen, LORazepam, LORazepam, metoCLOPramide (REGLAN) injection, metoCLOPramide, morphine injection, sodium chloride  ROS: Constitutional: Positive for a 20 pound weight loss over the last 2 years. Negative for fever, chills or  night sweats.  Eyes: Negative for blurred vision and double vision.  Respiratory: Negative for productive cough. No hemoptysis.No hoarseness.No neck swelling.  Positive for shortness of breath on exertion. No pleuritic chest pain.  Cardiovascular: Negative for chest pain. No palpitations.  GI: Negative for  nausea, vomiting, diarrhea or constipation. No change in bowel caliber. No  Melena or hematochezia. No abdominal pain.  GU: Negative for hematuria. No loss of urinary control. No urinary retention. Skin: Negative for itching. No rash. No petechia. No easy  Bruising. Musculoskeletal: Denies back , arm pain.   Neurological: No headaches.No confusion. No motor or sensory deficits.  Family History:    Family History  Problem Relation Age of Onset  . CAD Father   . Arthritis Mother   Brother died with lymphoma, was exposed to agent orange while in [place]    Social History: Quit smoking 06/17/2013 at which time she was smoking 1.5 ppd . Marland Kitchen She smoked from age 59 , up to 2 packs a day for about 35 years.she consumes about the pint a day of liquor. She reports that she does not use illicit drugs. Married for 22 years. 2 children. Her daughter [name]  is involved in her care, main contact.he is originally from [place].retired Glass blower/designer    Physical Exam    [place]:   06/24/13 1147  BP:   Pulse:   Temp: 98.1 F (36.7 C)  Resp:      Filed Weights   06/21/13 2006  Weight: 132 lb (59.875 kg)   General:  50 -year-old white female  in no acute distress, awake, follow simple, and, but unable to provide detailed conversation , appears mildly confused secondary to acute encephalopathy. HEENT: Normocephalic, atraumatic, PERRLA. Sclerae anicteric. Oral cavity without thrush or lesions. NECK:supple. no thyromegaly, no cervical or supraclavicular adenopathy  LUNGS: decreased breath sounds on the right, no wheezes rhonchi or rales No axillary masses. BREASTS: not examined. CARDIOVASCULAR: irregularly regular rate and rhythm, no murmur , rubs or gallops ABDOMEN: soft nontender , bowel sounds x4. No HSM. No masses palpable.  GU/rectal: deferred.urinary cath to gravity, with dark urine noted EXTREMITIES: no clubbing cyanosis or edema. No bruising or petechial rash MUSCULOSKELETAL: no spinal tenderness.right hip area status post surgery, bruising noted NEURO: Non Focal. No Horner's.   Labs:  CBC   Recent Labs Lab 06/21/13 1136 06/22/13 0520 06/23/13 0335 06/24/13 0309  WBC 14.7* 12.0* 9.7 10.9*  HGB 11.7* 10.4* 9.2* 8.7*  HCT 34.4* 31.0* 27.6* 26.4*  PLT 310 276 272 264  MCV 98.9 101.3*  102.6* 101.9*  MCH 33.6 34.0 34.2* 33.6  MCHC 34.0 33.5 33.3 33.0  RDW 13.8 14.0 14.1 14.1  LYMPHSABS 1.9  --   --  1.2  MONOABS 1.7*  --   --  2.1*  EOSABS 0.0  --   --  0.1  BASOSABS 0.0  --   --  0.0     CMP    Recent Labs Lab 06/21/13 1136 06/22/13 0520 06/22/13 1531 06/23/13 0335 06/24/13 0309  NA 135* 134*  --  137 138  K 3.8 3.2* 3.9 3.9 3.6*  CL 90* 92*  --  97 97  CO2 31 31  --  31 30  GLUCOSE 101* 138*  --  101* 120*  BUN 36* 24*  --  14 9  CREATININE 0.63 0.50  --  0.36* 0.34*  CALCIUM 9.6 8.7  --  8.3* 8.3*  MG  --   --   --   --  1.4*     No results found for this basename: bilitot, bilidir, ibili      Recent Labs Lab 07-15-13 1136  INR 0.93    No results found for this basename: DDIMER,  in the last 72 hours   Anemia panel:  No results found for this basename: VITAMINB12, FOLATE, FERRITIN, TIBC, IRON, RETICCTPCT,  in the last 72 hours   Imaging Studies:  Dg Hip Complete Right  2013/07/15   CLINICAL DATA:  Right hip pain secondary to a fall 2 days ago.  EXAM: RIGHT HIP - COMPLETE 2+ VIEW  COMPARISON:  None.  FINDINGS: There is an angulated comminuted slightly impacted intertrochanteric fracture of the proximal right femur. Lesser trochanter is displaced. 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  Pelvic bones are intact.  IMPRESSION: Comminuted intertrochanteric fracture of the proximal right femur.   Electronically Signed   By: Rozetta Nunnery M.D.   On: 2013/07/15 11:26   Dg Femur Right  06/22/2013   CLINICAL DATA:  Right femur fracture.  EXAM: RIGHT FEMUR - 2 VIEW; DG C-ARM 1-60 MIN  COMPARISON:  Radiographs dated 07-15-2013  FINDINGS: C-arm images demonstrate insertion of intra medullary nail and compression screw across the intertrochanteric fracture of the proximal right femur. Alignment and position of the fracture fragments is near anatomic. Hardware appears in good position.  IMPRESSION: Open reduction and internal  fixation of intertrochanteric fracture of the proximal right femur.   Electronically Signed   By: Rozetta Nunnery M.D.   On: 06/22/2013 13:08   Ct Head Wo Contrast  15-Jul-2013   CLINICAL DATA:  History of trauma from a fall. Right leg pain and weakness.  EXAM: CT HEAD WITHOUT CONTRAST  TECHNIQUE: Contiguous axial images were obtained from the base of the skull through the vertex without intravenous contrast.  COMPARISON:  No priors.  FINDINGS: Patchy and confluent areas of decreased attenuation are noted throughout the deep and periventricular white matter of the cerebral hemispheres bilaterally, compatible with chronic microvascular ischemic disease. No acute displaced skull fractures are identified. No acute intracranial abnormality. Specifically, no evidence of acute post-traumatic intracranial hemorrhage, no definite regions of acute/subacute cerebral ischemia, no focal mass, mass effect, hydrocephalus or abnormal intra or extra-axial fluid collections. The visualized paranasal sinuses and mastoids are well pneumatized.  IMPRESSION: 1. No acute displaced skull fracture or findings to suggest acute intracranial trauma. 2. Chronic microvascular ischemic changes in the cerebral white matter, as above.   Electronically Signed   By: Vinnie Langton M.D.   On: 07/15/2013 12:11   Ct Head W Wo Contrast  06/23/2013   CLINICAL DATA:  Encephalopathy. Lung mass. Rule out metastatic disease.  EXAM: CT HEAD WITHOUT AND WITH CONTRAST  TECHNIQUE: Contiguous axial images were obtained from the base of the skull through the vertex without and with intravenous contrast  CONTRAST:  149mL OMNIPAQUE IOHEXOL 300 MG/ML  SOLN  COMPARISON:  CT head 07-15-2013  FINDINGS: Mild atrophy. Negative for hydrocephalus. Patchy hypodensity throughout the cerebral white matter bilaterally does not enhance and is most consistent with chronic microvascular ischemia.  Negative for acute infarct.  Negative for hemorrhage.  Postcontrast imaging  reveals no enhancing lesions. No evidence of metastatic disease.  Calvarium is negative  IMPRESSION: Atrophy and chronic microvascular ischemic change in the white matter.  Negative for metastatic disease.  No acute abnormality.   Electronically Signed   By: Franchot Gallo M.D.   On: 06/23/2013 10:57   Ct Chest W Contrast  06/21/2013   CLINICAL DATA:  Paratracheal mass  EXAM: CT CHEST WITH CONTRAST  TECHNIQUE: Multidetector CT imaging of the chest was performed during intravenous contrast administration.  CONTRAST:  One daughter cc of Omnipaque 300  COMPARISON:  Prior radiograph performed earlier on the same date.  FINDINGS: The visualized thyroid is within normal limits.  Aorta and great vessels are of normal caliber. Prominent atherosclerotic calcifications noted at the origin of the great vessels.  Heart size is within normal limits.  No pericardial effusion.  A large heterogeneous mass measuring 7.2 x 5.0 x 6.4 cm is seen within the medial aspect of the right upper lobe (series 2, image 20). Central hypodensity seen within this lesion is most consistent with necrosis. The mass abuts and invades the anterior mediastinum medially, and anterior medial chest wall anteriorly. There is secondary mass effect on knee adjacent SVC. Right upper lobe segmental pulmonary arteries are attenuated at the level of the mass. There is associated atelectasis within the right upper lobe Finding is highly concerning for bronchogenic carcinoma.  An adjacent satellite nodule measuring 6 mm is seen at the lateral aspect of the primary mass lesion (series 3, image 20). Additional subpleural nodules measuring up to 7 mm are seen more peripherally within the right upper lobe (series 3, image 23, 24). Additional dense 7 mm nodule seen more superiorly near the right lung apex (series 3, image 11).  No focal infiltrate or pleural effusion.  A large necrotic right hilar lymph node measuring 4.0 x 4.6 x 4.3 cm is present (series 2, image  27). This node exerts mass effect on the right main pulmonary posteriorly which is slightly narrowed. The root right pulmonary artery and its branches are opacified distally. A 2nd 1 cm necrotic precarinal node is also present (series 2, image 22).  Diffuse hypoattenuation is seen within liver, suggestive of steatosis. Partially visualized upper abdomen is otherwise unremarkable.  Remote left 8th rib fracture is noted (series 2, image 36). No acute osseous abnormality. No focal lytic or blastic osseous lesions.  IMPRESSION: 1. 7.2 x 5.0 x 6.4 cm necrotic right upper lobe mass, most consistent with primary lung malignancy. The mass appears to be locally invasive into the anterior mediastinum medially. 2. Enlarged necrotic right hilar lymph node with smaller precarinal node as above, worrisome for nodal metastasis. 3. Additional subcentimeter pulmonary nodules within the right upper lobe as above, worrisome for malignancy as well. 4. The subacute healing fracture of the left posterolateral 8th rib. 5. Hepatic steatosis.   Electronically Signed   By: Jeannine Boga M.D.   On: 06/21/2013 16:40   Dg Chest Port 1 View  06/21/2013   CLINICAL DATA:  Preop for hip fracture.  Slight cough.  EXAM: PORTABLE CHEST - 1 VIEW  COMPARISON:  None.  FINDINGS: The heart size is normal. A right peritracheal mass measures 5.3 x 7.0 cm. Other smaller right-sided nodules are evident. A 2 cm left pulmonary nodule is suggested. There is no edema or effusion to suggest failure. The right hemidiaphragm is elevated.  No acute focal airspace disease is evident.  IMPRESSION: 1. 7 cm right peritracheal mass. Recommend CT of the chest with contrast for further evaluation. 2. Additional smaller nodules are suggested, including a 2 cm left lower lobe nodule.   Electronically Signed   By: Bretta Bang.D.  On: 06/21/2013 15:10   Dg C-arm 1-60 Min  06/22/2013   CLINICAL DATA:  Right femur fracture.  EXAM: RIGHT FEMUR - 2 VIEW; DG C-ARM  1-60 MIN  COMPARISON:  Radiographs dated 06/21/2013  FINDINGS: C-arm images demonstrate insertion of intra medullary nail and compression screw across the intertrochanteric fracture of the proximal right femur. Alignment and position of the fracture fragments is near anatomic. Hardware appears in good position.  IMPRESSION: Open reduction and internal fixation of intertrochanteric fracture of the proximal right femur.   Electronically Signed   By: Rozetta Nunnery M.D.   On: 06/22/2013 13:08      A/P: 63 y.o. femaleweight ongoing history of tobacco abuse, COPD and a history of alcohol abuse admitted to [facility] after sustaining a right femoral fracture requiring ORIF, found incidentally to have a large 7.2 x 5.0 x 6.4 cm necrotic right upper lobe mass, most consistent with primary lung malignancy. The mass appears to be locally invasive into the anterior mediastinum medially. Enlarged necrotic right hilar lymph node with smaller precarinal node and additional subcentimeter pulmonary nodules within the right upper lobe were seen.at the time of her surgery, she also underwent EBUS from which biopsies were taken for tissue diagnosis. Preliminary report is consistent with non-small cell lung carcinoma. In addition bone sample was sent to pathology. Formal diagnosis is pending We were kindly requested to evaluate the patient.  Dr. Humphrey Rolls   is to see the patient following this consult with recommendations regarding diagnosis, treatment options and further workup studies which may include completion of the staging CTs with a CT of the abdomen and pelvis once she recuperates from surgery to rule out occult malignancy.upon discharge, she is to be transferred to the skilled nursing facility. An addendum to this note is to be written.  Thank you for the referral.  Rondel Jumbo, PA-C 06/24/2013 11:58 AM  ATTENDING'S ATTESTATION:  I personally reviewed patient's chart, examined patient myself, formulated the treatment  plan as followed.    I have reviewed patient's radiology as well as pathology. Her bone biopsy did not reveal evidence of a malignancy. Biopsies from this procedure results are pending. However pulmonary looks like this is a non-small cell lung cancer. Patient will need further staging studies including CT abdomen and pelvis as well as PET scan. Patient certainly would be a good candidate for chemotherapy. I do not think she is a surgical candidate. I have discussed this with the patient. We will do further planning and discussions of definitive therapy once patient is clinically stable and was discharged home. I will plan on following up with her in my clinic upon discharge.  Marcy Panning, MD Medical/Oncology [facility] [phone] (beeper) [phone] (Office)  06/24/2013, 9:44 PM

## 2013-06-24 NOTE — Evaluation (Signed)
Physical Therapy Evaluation Patient Details Name: Stephanie Oliver MRN: [id number] DOB: [birth year] Today's Date: 06/24/2013 Time: 4166-0630 PT Time Calculation (min): 37 min  PT Assessment / Plan / Recommendation History of Present Illness  63 yo active smoker with reported weight loss since 2 years ago admitted for hip fracture sustained after a fall at home and underwent Rt hip gamma nail fixation 06/22/13 and is WBAT.  Chest x-ray showeed large rite sided lung mass and she underwent a biopsy during surgery 06/22/13.  Initial pathology report consistent with NSCLC. Pt with h/o ETOH abuse  Clinical Impression  Patient is s/p above surgery resulting in functional limitations due to the deficits listed below (see PT Problem List). Pt also newly diagnosed lung Ca. Pt will need assistance for mobility on discharge and unclear if she has anyone who can assist her while daughter is at work. Patient will benefit from skilled PT to increase their independence and safety with mobility to allow discharge to the venue listed below.       PT Assessment  Patient needs continued PT services    Follow Up Recommendations  CIR;Supervision for mobility/OOB    Does the patient have the potential to tolerate intense rehabilitation      Barriers to Discharge Decreased caregiver support      Equipment Recommendations  Rolling walker with 5" wheels    Recommendations for Other Services     Frequency Min 3X/week    Precautions / Restrictions Precautions Precautions: Fall Restrictions Weight Bearing Restrictions: Yes RLE Weight Bearing: Weight bearing as tolerated   Pertinent Vitals/Pain 10/10 rt hip pain; RN provided medication to assist with pain control; patient repositioned for comfort       Mobility  Bed Mobility Overal bed mobility: Needs Assistance;+2 for physical assistance Bed Mobility: Supine to Sit Supine to sit: Mod assist;+2 for physical assistance;HOB elevated General bed mobility  comments: Requires step by step cues/instruction; she was able to assist with sliding Lt. LE off bed and required assist to lift shoulders Transfers Overall transfer level: Needs assistance Equipment used: Rolling walker (2 wheeled) Transfers: Sit to/from [facility] Sit to Stand: Mod assist;+2 physical assistance Stand pivot transfers: Mod assist;+2 physical assistance General transfer comment: Pt requires cues and assist to achieve erect standing and verbal cues/instruction for all aspects of transfer. Pt with difficulty weightbearing through Rt. LE due to increased pain. Pt with poor coordination (vs weakness) of UEs as trying to use RW and step with LLE. Pt scooting/pivoting on her LLE vs actual stepping. Pt transferred bed to [facility]; BSC to recliner and unable to control descent when moving to seated position in recliner    Exercises General Exercises - Lower Extremity Ankle Circles/Pumps: AROM;Both;15 reps;Supine Quad Sets: AROM;Both;10 reps;Supine Heel Slides: AAROM;Right;Other reps (comment);Supine   PT Diagnosis: Difficulty walking;Acute pain  PT Problem List: Decreased strength;Decreased range of motion;Decreased activity tolerance;Decreased balance;Decreased mobility;Decreased coordination;Decreased knowledge of use of DME;Cardiopulmonary status limiting activity;Pain PT Treatment Interventions: DME instruction;Gait training;Functional mobility training;Therapeutic activities;Therapeutic exercise;Patient/family education     PT Goals(Current goals can be found in the care plan section) Acute Rehab PT Goals Patient Stated Goal: To get better PT Goal Formulation: With patient Time For Goal Achievement: 07/01/13 Potential to Achieve Goals: Good  Visit Information  Last PT Received On: 06/24/13 Assistance Needed: +2 PT/OT/SLP Co-Evaluation/Treatment: Yes Reason for Co-Treatment: Complexity of the patient's impairments (multi-system involvement);For patient/therapist  safety PT goals addressed during session: Mobility/safety with mobility;Proper use of DME;Strengthening/ROM OT goals addressed during session:  ADL's and self-care;Other (comment) (functional transfers) History of Present Illness: 63 yo active smoker with reported weight loss since 2 years ago admitted for hip fracture sustained after a fall at home and underwent Rt hip gamma nail fixation 06/22/13 and is WBAT.  Chest x-ray showeed large rite sided lung mass and she underwent a biopsy during surgery 06/22/13.  Initial pathology report consistent with NSCLC. Pt with h/o ETOH abuse       Prior Functioning  Home Living Family/patient expects to be discharged to:: Skilled nursing facility Living Arrangements: Children Additional Comments: Pt lived with dtr and infant grandson in a split [place] home. 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 Communication Communication: No difficulties Dominant Hand: Right    Cognition  Cognition Arousal/Alertness: Awake/alert Behavior During Therapy: Anxious Overall Cognitive Status: Within Functional Limits for tasks assessed (No obvious deficits noted)    Extremity/Trunk Assessment Upper Extremity Assessment Upper Extremity Assessment: Defer to OT evaluation Lower Extremity Assessment Lower Extremity Assessment: RLE deficits/detail RLE Deficits / Details: PROM Rt hip to 90 (sitting); knee to 90, ankle to 10 DF; strength significantly impaired due to pain RLE: Unable to fully assess due to pain Cervical / Trunk Assessment Cervical / Trunk Assessment: Normal   Balance Balance Overall balance assessment: Needs assistance Sitting-balance support: Feet supported Sitting balance-Leahy Scale: Poor Dynamic Sitting - Comments: static sitting - pt leaning to Lt due to increased pain Postural control: Left lateral  lean Standing balance support: Bilateral upper extremity supported Standing balance-Leahy Scale: Poor Standing balance comment: flexed at hips with poor use of UEs on RW to right torso;  General Comments General comments (skin integrity, edema, etc.): Pt very anxious throughout eval. 02 sats 86-88% on 2L during activity. Rebounds to 93% with deep breathing   End of Session PT - End of Session Equipment Utilized During Treatment: Gait belt;Oxygen Activity Tolerance: Patient limited by pain Patient left: in chair;with call bell/phone within reach Nurse Communication: Mobility status  GP     Riad Wagley 06/24/2013, 5:47 PM Pager [phone]

## 2013-06-24 NOTE — Progress Notes (Signed)
OT Cancellation Note  Patient Details Name: Stephanie Oliver MRN: [id number] DOB: 06-04-1951   Cancelled Treatment:    Reason Eval/Treat Not Completed: Patient at procedure or test/ unavailable.  Getting PICC line.  Will reattempt  Lucille Passy, OTR/L [phone]  06/24/2013, 10:23 AM

## 2013-06-24 NOTE — Progress Notes (Signed)
Rehab Admissions Coordinator Note:  Patient was screened by Retta Diones for appropriateness for an Inpatient Acute Rehab Consult.  At this time, we are recommending Stephanie Oliver.  Given current diagnosis, [employer] is very unlikely to authorize an acute inpatient rehab admission.  Retta Diones 06/24/2013, 6:10 PM  I can be reached at [phone].

## 2013-06-24 NOTE — Progress Notes (Signed)
Pt transferred to unit at 2020 by Charmayne Sheer, RN. Pt alert, oriented, and pleasant. Double lumen PICC line clamped off, dressing intact. Placed on tele box 21, NSR 90  w/ hx of a-fib. Pt has some ecchymotic areas on right side including arm, hip, and leg. No family at bedside. Pt oriented to room, call bell within reach, bed alarm on. Will continue to monitor pt per MD orders.

## 2013-06-24 NOTE — Progress Notes (Signed)
PT Cancellation Note  Patient Details Name: [name] MRN: [id number] DOB: Mar 23, 1951   Cancelled Treatment:    Reason Eval/Treat Not Completed: Patient at procedure or test/unavailable--pt getting PICC line. Will attempt to see later today   Devera Englander 06/24/2013, 10:41 AM Pager [phone]

## 2013-06-24 NOTE — Progress Notes (Signed)
Peripherally Inserted Central Catheter/Midline Placement  The IV Nurse has discussed with the patient and/or persons authorized to consent for the patient, the purpose of this procedure and the potential benefits and risks involved with this procedure.  The benefits include less needle sticks, lab draws from the catheter and patient may be discharged home with the catheter.  Risks include, but not limited to, infection, bleeding, blood clot (thrombus formation), and puncture of an artery; nerve damage and irregular heat beat.  Alternatives to this procedure were also discussed.  PICC/Midline Placement Documentation        Stephanie Oliver 06/24/2013, 10:53 AM Consent obtained by Jacobo Forest, RN, CRNI

## 2013-06-24 NOTE — Evaluation (Signed)
Occupational Therapy Evaluation Patient Details Name: Stephanie Oliver MRN: [id number] DOB: Oct 25, 1950 Today's Date: 06/24/2013 Time: 5732-2025 OT Time Calculation (min): 27 min  OT Assessment / Plan / Recommendation History of present illness 63 yo active smoker with reported weight loss since 2 years ago admitted for hip fracture sustained after a fall at home and underwent Rt hip gamma nail fixation 06/22/13 and is WBAT.  Chest x-ray showeed large rite sided lung mass and she underwent a biopsy during surgery 06/22/13.  Initial pathology report consistent with NSCLC. Pt with h/o ETOH abuse   Clinical Impression   Pt admitted with above.  She presents to OT with the below listed deficits.  She currently requires max A with LB ADLs and +2 assist for basic transfers.  She does not have adequate assistance at home, and has to ascend and descend steps daily and will therefore need SNF level rehab at discharge.     OT Assessment  Patient needs continued OT Services    Follow Up Recommendations  SNF;Supervision/Assistance - 24 hour    Barriers to Discharge Decreased caregiver support    Equipment Recommendations  None recommended by OT    Recommendations for Other Services    Frequency  Min 2X/week    Precautions / Restrictions Precautions Precautions: Fall Restrictions Weight Bearing Restrictions: Yes RLE Weight Bearing: Weight bearing as tolerated   Pertinent Vitals/Pain     ADL  Eating/Feeding: Independent Where Assessed - Eating/Feeding: Bed level;Chair Grooming: Wash/dry hands;Wash/dry face;Teeth care;Set up Where Assessed - Grooming: Supported sitting Upper Body Bathing: Set up Where Assessed - Upper Body Bathing: Supported sitting Lower Body Bathing: Maximal assistance Where Assessed - Lower Body Bathing: Supported sit to stand Upper Body Dressing: Minimal assistance Where Assessed - Upper Body Dressing: Supported sitting Lower Body Dressing: +1 Total assistance Where Assessed  - Lower Body Dressing: Supported sit to Lobbyist: +2 Total assistance Toilet Transfer: Patient Percentage: 60% Armed forces technical officer Method: Sit to stand;Stand pivot Science writer: Therapist, occupational and Hygiene: Maximal assistance Where Assessed - Best boy and Hygiene: Sit to stand from 3-in-1 or toilet Equipment Used: Rolling walker Transfers/Ambulation Related to ADLs: +2 total A (pt ~50% for stand pivot transfers) ADL Comments: Pt requires verbal cues for hand placement, and walker safety     OT Diagnosis: Generalized weakness;Acute pain  OT Problem List: Decreased strength;Decreased activity tolerance;Impaired balance (sitting and/or standing);Decreased safety awareness;Decreased knowledge of use of DME or AE;Pain OT Treatment Interventions: Self-care/ADL training;DME and/or AE instruction;Therapeutic activities;Patient/family education;Balance training   OT Goals(Current goals can be found in the care plan section) Acute Rehab OT Goals Patient Stated Goal: To get better OT Goal Formulation: With patient Time For Goal Achievement: 07/08/13 Potential to Achieve Goals: Good ADL Goals Pt Will Perform Grooming: with min assist;standing Pt Will Perform Lower Body Bathing: with min assist;sit to/from stand;with adaptive equipment Pt Will Perform Lower Body Dressing: with min assist;with adaptive equipment;sit to/from stand Pt Will Transfer to Toilet: with min assist;ambulating;regular height toilet;grab bars Pt Will Perform Toileting - Clothing Manipulation and hygiene: with min assist;sit to/from stand  Visit Information  Last OT Received On: 06/24/13 Assistance Needed: +2 PT/OT/SLP Co-Evaluation/Treatment: Yes Reason for Co-Treatment: For patient/therapist safety OT goals addressed during session: ADL's and self-care;Other (comment) (functional transfers) History of Present Illness: 63 yo active smoker with  reported weight loss since 2 years ago admitted for hip fracture sustained after a fall at home and underwent Rt hip gamma nail  fixation 06/22/13 and is WBAT.  Chest x-ray showeed large rite sided lung mass and she underwent a biopsy during surgery 06/22/13.  Initial pathology report consistent with NSCLC. Pt with h/o ETOH abuse       Prior Functioning     Home Living Family/patient expects to be discharged to:: Skilled nursing facility Living Arrangements: Children Additional Comments: Pt lived with dtr and infant grandson in a split [place] home.  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 Communication Communication: No difficulties Dominant Hand: Right         Vision/Perception     Cognition  Cognition Arousal/Alertness: Awake/alert Behavior During Therapy: Anxious Overall Cognitive Status: Within Functional Limits for tasks assessed (No obvious deficits noted)    Extremity/Trunk Assessment Upper Extremity Assessment Upper Extremity Assessment: Overall WFL for tasks assessed Lower Extremity Assessment Lower Extremity Assessment: Defer to PT evaluation Cervical / Trunk Assessment Cervical / Trunk Assessment: Normal     Mobility Bed Mobility Overal bed mobility: Needs Assistance;+2 for physical assistance Bed Mobility: Supine to Sit Supine to sit: Mod assist;+2 for physical assistance;HOB elevated General bed mobility comments: Requires step by step cues/instruction; she was able to assist with sliding Rt. LE off bed and required assist to lift shoulders Transfers Overall transfer level: Needs assistance Equipment used: Rolling walker (2 wheeled) Transfers: Sit to/from [facility] Sit to Stand: Mod assist;+2 physical assistance Stand pivot transfers: Mod assist;+2 physical assistance General transfer comment: Pt requires cues and assist to achieve erect  standing and verbal cues/instruction for all aspects of transfer.  Pt with difficulty weightbearing through Rt. LE due to increased pain.  Pt transferred bed to [facility]; BSC to recliner and unable to control descent when moving to seated position in recliner     Exercise     Balance Balance Overall balance assessment: Needs assistance Sitting-balance support: Feet supported;Bilateral upper extremity supported Sitting balance-Leahy Scale: Poor Dynamic Sitting - Comments: static sitting - pt leaning to Lt due to increased pain Standing balance support: Bilateral upper extremity supported Standing balance-Leahy Scale: Poor General Comments General comments (skin integrity, edema, etc.): Pt very anxious throughout eval.  02 sats 86-88% on 2L during activity.  Rebounds to 93% with deep breathing    End of Session OT - End of Session Equipment Utilized During Treatment: Rolling walker;Gait belt;Oxygen Activity Tolerance: Patient limited by pain Patient left: in chair;with call bell/phone within reach Nurse Communication: Mobility status;Patient requests pain meds  GO     Amneet Cendejas, Ellard Artis M 06/24/2013, 3:45 PM

## 2013-06-25 DIAGNOSIS — W19XXXA Unspecified fall, initial encounter: Secondary | ICD-10-CM

## 2013-06-25 DIAGNOSIS — C3490 Malignant neoplasm of unspecified part of unspecified bronchus or lung: Secondary | ICD-10-CM

## 2013-06-25 DIAGNOSIS — S72143A Displaced intertrochanteric fracture of unspecified femur, initial encounter for closed fracture: Secondary | ICD-10-CM

## 2013-06-25 LAB — CBC WITH DIFFERENTIAL/PLATELET
Basophils Absolute: 0 10*3/uL (ref 0.0–0.1)
Basophils Relative: 0 % (ref 0–1)
EOS PCT: 1 % (ref 0–5)
Eosinophils Absolute: 0.1 10*3/uL (ref 0.0–0.7)
HEMATOCRIT: 27.7 % — AB (ref 36.0–46.0)
Hemoglobin: 9.3 g/dL — ABNORMAL LOW (ref 12.0–15.0)
LYMPHS ABS: 1.3 10*3/uL (ref 0.7–4.0)
LYMPHS PCT: 8 % — AB (ref 12–46)
MCH: 34.1 pg — ABNORMAL HIGH (ref 26.0–34.0)
MCHC: 33.6 g/dL (ref 30.0–36.0)
MCV: 101.5 fL — ABNORMAL HIGH (ref 78.0–100.0)
MONOS PCT: 16 % — AB (ref 3–12)
Monocytes Absolute: 2.4 10*3/uL — ABNORMAL HIGH (ref 0.1–1.0)
Neutro Abs: 11.6 10*3/uL — ABNORMAL HIGH (ref 1.7–7.7)
Neutrophils Relative %: 75 % (ref 43–77)
Platelets: 372 10*3/uL (ref 150–400)
RBC: 2.73 MIL/uL — AB (ref 3.87–5.11)
RDW: 14.3 % (ref 11.5–15.5)
WBC: 15.5 10*3/uL — ABNORMAL HIGH (ref 4.0–10.5)

## 2013-06-25 LAB — MAGNESIUM: Magnesium: 1.3 mg/dL — ABNORMAL LOW (ref 1.5–2.5)

## 2013-06-25 LAB — BASIC METABOLIC PANEL
BUN: 9 mg/dL (ref 6–23)
CO2: 31 mEq/L (ref 19–32)
Calcium: 8.5 mg/dL (ref 8.4–10.5)
Chloride: 93 mEq/L — ABNORMAL LOW (ref 96–112)
Creatinine, Ser: 0.35 mg/dL — ABNORMAL LOW (ref 0.50–1.10)
GFR calc Af Amer: >90 mL/min (ref >90)
Glucose, Bld: 119 mg/dL — ABNORMAL HIGH (ref 70–99)
POTASSIUM: 3.8 meq/L (ref 3.7–5.3)
Sodium: 134 mEq/L — ABNORMAL LOW (ref 137–147)

## 2013-06-25 LAB — PHOSPHORUS: Phosphorus: 2.6 mg/dL (ref 2.3–4.6)

## 2013-06-25 MED ORDER — POTASSIUM CHLORIDE CRYS ER 20 MEQ PO TBCR
40.0000 meq | EXTENDED_RELEASE_TABLET | Freq: Once | ORAL | Status: AC
  Administered 2013-06-25: 10:00:00 40 meq via ORAL
  Filled 2013-06-25: qty 2

## 2013-06-25 MED ORDER — LORAZEPAM 1 MG PO TABS
2.0000 mg | ORAL_TABLET | Freq: Once | ORAL | Status: AC
  Administered 2013-06-25: 2 mg via ORAL
  Filled 2013-06-25: qty 2

## 2013-06-25 MED ORDER — METOPROLOL TARTRATE 1 MG/ML IV SOLN
INTRAVENOUS | Status: AC
  Filled 2013-06-25: qty 5

## 2013-06-25 MED ORDER — FUROSEMIDE 10 MG/ML IJ SOLN
40.0000 mg | Freq: Once | INTRAMUSCULAR | Status: AC
  Administered 2013-06-25: 10:00:00 40 mg via INTRAVENOUS
  Filled 2013-06-25: qty 4

## 2013-06-25 MED ORDER — IPRATROPIUM-ALBUTEROL 0.5-2.5 (3) MG/3ML IN SOLN
3.0000 mL | Freq: Four times a day (QID) | RESPIRATORY_TRACT | Status: DC
  Administered 2013-06-25 – 2013-06-26 (×4): 3 mL via RESPIRATORY_TRACT
  Filled 2013-06-25 (×4): qty 3

## 2013-06-25 MED ORDER — METOPROLOL TARTRATE 1 MG/ML IV SOLN
5.0000 mg | Freq: Once | INTRAVENOUS | Status: AC
  Administered 2013-06-25: 5 mg via INTRAVENOUS

## 2013-06-25 NOTE — Progress Notes (Signed)
Physical Therapy Treatment Patient Details Name: Stephanie Oliver MRN: [id number] DOB: 1950-07-01 Today's Date: 06/25/2013 Time: 7353-2992 PT Time Calculation (min): 26 min  PT Assessment / Plan / Recommendation  History of Present Illness 63 yo active smoker with reported weight loss since 2 years ago admitted for hip fracture sustained after a fall at home and underwent Rt hip gamma nail fixation 06/22/13 and is WBAT.  Chest x-ray showeed large rite sided lung mass and she underwent a biopsy during surgery 06/22/13.  Initial pathology report consistent with NSCLC. Pt with h/o ETOH abuse   PT Comments   Patient demonstrates continued need for assist with mobility, was able to initiate bed mobility but unable to carry out task without increased assist. Increased work of breathing evident today (nsg aware of audible crackles and rails today) Will continue to see as indicated and progress activity as tolerated.  Follow Up Recommendations  CIR;Supervision for mobility/OOB           Equipment Recommendations  Rolling walker with 5" wheels       Frequency Min 3X/week   Progress towards PT Goals Progress towards PT goals: Progressing toward goals  Plan Current plan remains appropriate    Precautions / Restrictions Precautions Precautions: Fall Restrictions Weight Bearing Restrictions: Yes RLE Weight Bearing: Weight bearing as tolerated   Pertinent Vitals/Pain 7/10    Mobility  Bed Mobility Overal bed mobility: Needs Assistance;+2 for physical assistance Bed Mobility: Supine to Sit Supine to sit: Mod assist;+2 for physical assistance;HOB elevated General bed mobility comments: Continues to require assist for trunk elevation and control of LEs, assist to rotate trunk to EOB Transfers Overall transfer level: Needs assistance Equipment used: Rolling walker (2 wheeled) Transfers: Sit to/from [facility] Sit to Stand: Mod assist;+2 physical assistance Stand pivot transfers: Mod  assist;+2 physical assistance General transfer comment: Patient having difficulty standing in upright position, with increased assist needed, patient limited by pain and anxiety. Patient could not initiate LE movement for pivot.    Exercises General Exercises - Lower Extremity Ankle Circles/Pumps: AROM;Both;15 reps;Supine    PT Goals (current goals can now be found in the care plan section) Acute Rehab PT Goals Patient Stated Goal: To get better PT Goal Formulation: With patient Time For Goal Achievement: 07/01/13 Potential to Achieve Goals: Good  Visit Information  Last PT Received On: 06/25/13 Assistance Needed: +2 History of Present Illness: 63 yo active smoker with reported weight loss since 2 years ago admitted for hip fracture sustained after a fall at home and underwent Rt hip gamma nail fixation 06/22/13 and is WBAT.  Chest x-ray showeed large rite sided lung mass and she underwent a biopsy during surgery 06/22/13.  Initial pathology report consistent with NSCLC. Pt with h/o ETOH abuse    Subjective Data  Subjective: I want to sit up Patient Stated Goal: To get better   Cognition  Cognition Arousal/Alertness: Awake/alert Behavior During Therapy: Anxious Overall Cognitive Status: Within Functional Limits for tasks assessed (No obvious deficits noted)    Balance  Balance Overall balance assessment: Needs assistance Sitting-balance support: Feet supported Sitting balance-Leahy Scale: Poor Dynamic Sitting - Comments: pt continues to demonstrate lateral lean to the left today, pillows positioned to assist with alignment of trunk Postural control: Left lateral lean Standing balance support: Bilateral upper extremity supported Standing balance-Leahy Scale: Poor General Comments General comments (skin integrity, edema, etc.): Pt reports sitting up is easier to breathe, audible rails heard with breathing throughout session, SpO2 monitored closely, remained >  91% despite increased  work of breathing.  End of Session PT - End of Session Equipment Utilized During Treatment: Gait belt;Oxygen Activity Tolerance: Patient limited by pain Patient left: in chair;with call bell/phone within reach;with chair alarm set Nurse Communication: Mobility status   GP     Duncan Dull 06/25/2013, 10:40 AM Alben Deeds, PT DPT  [phone]

## 2013-06-25 NOTE — Progress Notes (Signed)
Pt w/ [facility] crackles, notified RN.

## 2013-06-25 NOTE — Progress Notes (Signed)
Patient ID: Stephanie Oliver, female   DOB: 1951/05/22, 63 y.o.   MRN: [id number]     Subjective:  Patient reports pain as mild.  Patient just had pain medicine and is very sleepy.  Does follow commands but could not answer questions  Objective:   VITALS:   Filed Vitals:   06/25/13 0340 06/25/13 0400 06/25/13 0427 06/25/13 1119  BP: 104/69  134/82 140/84  Pulse: 135  101 100  Temp:   97.2 F (36.2 C)   TempSrc:   Oral   Resp:  20 20   Height:      Weight:      SpO2:  92% 92%     ABD soft Sensation intact distally Dorsiflexion/Plantar flexion intact Incision: dressing C/D/I and no drainage   Lab Results  Component Value Date   WBC 15.5* 06/25/2013   HGB 9.3* 06/25/2013   HCT 27.7* 06/25/2013   MCV 101.5* 06/25/2013   PLT 372 06/25/2013     Assessment/Plan: 3 Days Post-Op   Principal Problem:   Intertrochanteric fracture of right hip Active Problems:   Alcohol abuse, continuous   Hypertension   Hip fracture   Mediastinal lymphadenopathy   COPD (chronic obstructive pulmonary disease)   Tobacco use disorder   Encephalopathy acute   PAF (paroxysmal atrial fibrillation)   Non-small cell cancer of right lung   Advance diet Up with therapy Continue plan per medicine WBAT  Dry dressing PRN Follow up with Dr Percell Miller in two weeks Ortho signing Stephanie Oliver, Stephanie Oliver 06/25/2013, 11:35 AM   Marchia Bond, MD Cell [phone]

## 2013-06-25 NOTE — Consult Note (Signed)
Physical Medicine and Rehabilitation Consult  Reason for Consult: Right hip fracture Referring Physician: Dr. Chase Caller   HPI: Stephanie Oliver is a 63 y.o. female with history of alcohol abuse, depression with anxiety, smoker who fell on 06/18/13 with onset of pain and difficulty wight bearing on RLE and inability to walk. She was admitted on 06/21/13 for work up. sje was found to have right comminuted IT hip fracture as well as 7.2 x 5.0 x 6.4 cm necrotic right upper lobe mass (most consistent with primary lung malignancy) locally invasive to anterior mediastinum as well as enlarged right hilar node with smaller pericarnal nodes and additional sub-centimeter RUL pulmonary nodules. She underwent right hip IM nail fixation by Dr. Percell Miller as well as bronchoscopy with tracheobronchial needle aspiration of R-paratracheal mass. Bone biopsy of right hip negative for cancer. Cytology positive for non-small cell carcinoma. Dr. Humphrey Rolls consulted for input and recommends full workup and chemotherapy--patient not a surgical candidate. Final decision to be made after recovery and  d/c to home. Therapies initiated yesterday and patient noted to have difficulty weight bearing through RLE, poor coordination of BUE as well as problems processing. MD, PT recommending CIR.   Patient is somnolent but awakens to answer questions. Some confusion noted  Review of Systems  HENT: Negative for hearing loss.   Eyes: Negative for blurred vision and double vision.  Respiratory: Negative for cough.   Cardiovascular: Negative for chest pain and palpitations.  Gastrointestinal: Negative for heartburn and nausea.  Musculoskeletal: Positive for joint pain and myalgias.  Neurological: Negative for headaches.     Past Medical History  Diagnosis Date  . Depression   . Panic attacks   . Hypertension   . Smoker   . Alcohol abuse     History reviewed. No pertinent past surgical history. Family History  Problem Relation Age of  Onset  . CAD Father   . Arthritis Mother     Social History:  reports that she has been smoking Cigarettes.  She has been smoking about 1.50 packs per day. She does not have any smokeless tobacco history on file. She reports that she drinks alcohol. She reports that she does not use illicit drugs.   Allergies  Allergen Reactions  . Lisinopril Diarrhea and Nausea Only  . Paxil [Paroxetine] Diarrhea and Nausea Only  . Penicillins Hives    Medications Prior to Admission  Medication Sig Dispense Refill  . ALPRAZolam (XANAX) 0.5 MG tablet Take 0.5 mg by mouth 2 (two) times daily as needed for anxiety.       Marland Kitchen aspirin 325 MG tablet Take 325 mg by mouth daily.      . Cholecalciferol (VITAMIN D-3 PO) Take 1 tablet by mouth daily.      . metoprolol succinate (TOPROL-XL) 100 MG 24 hr tablet Take 100 mg by mouth daily.      . Multiple Vitamin (MULTIVITAMIN WITH MINERALS) TABS tablet Take 1 tablet by mouth daily.      . naproxen sodium (ANAPROX) 220 MG tablet Take 220 mg by mouth 2 (two) times daily with a meal.      . sertraline (ZOLOFT) 100 MG tablet Take 100 mg by mouth daily.        Home: Home Living Family/patient expects to be discharged to:: Skilled nursing facility Living Arrangements: Children Additional Comments: Pt lived with dtr and infant grandson in a split [place] home. 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 Functional Status:  Mobility:          ADL: ADL Eating/Feeding: Independent Where Assessed - Eating/Feeding: Bed level;Chair Grooming: Wash/dry hands;Wash/dry face;Teeth care;Set up Where Assessed - Grooming: Supported sitting Upper Body Bathing: Set up Where Assessed - Upper Body Bathing: Supported sitting Lower Body Bathing: Maximal assistance Where Assessed - Lower Body Bathing: Supported sit to stand Upper Body Dressing:  Minimal assistance Where Assessed - Upper Body Dressing: Supported sitting Lower Body Dressing: +1 Total assistance Where Assessed - Lower Body Dressing: Supported sit to Lobbyist: +2 Total assistance Toilet Transfer Method: Sit to stand;Stand pivot Science writer: Bedside commode Equipment Used: Rolling walker Transfers/Ambulation Related to ADLs: +2 total A (pt ~50% for stand pivot transfers) ADL Comments: Pt requires verbal cues for hand placement, and walker safety   Cognition: Cognition Overall Cognitive Status: Within Functional Limits for tasks assessed (No obvious deficits noted) Orientation Level: Oriented to person;Oriented to situation Cognition Arousal/Alertness: Awake/alert Behavior During Therapy: Anxious Overall Cognitive Status: Within Functional Limits for tasks assessed (No obvious deficits noted)  Blood pressure 134/82, pulse 101, temperature 97.2 F (36.2 C), temperature source Oral, resp. rate 20, height 5\' 3"  (1.6 m), weight 59.875 kg (132 lb), SpO2 92.00%. Physical Exam  Nursing note and vitals reviewed. Constitutional: She is oriented to person, place, and time. She appears well-developed.  Lethargic, ill appearing female  HENT:  Head: Normocephalic and atraumatic.  Eyes: Conjunctivae are normal. Pupils are equal, round, and reactive to light.  Neck: Normal range of motion.  Cardiovascular: Normal rate and regular rhythm.   Respiratory: Effort normal. No respiratory distress. She has wheezes.  GI: Soft. Bowel sounds are normal.  Musculoskeletal: She exhibits edema (moderate edema R-thigh) and tenderness (pain with attempts at ROM RLE).  Neurological: She is oriented to person, place, and time.  Had diffculty staying awake. Able to follow simple commands.   Skin: Skin is warm and dry.   4/5 bilateral deltoid, bicep, tricep grip 1/5 right hip flexor knee extensor knee flexor and 2 minus ankle dorsiflexor plantar flexor 3 minus left  hip flexor knee extensor ankle dysfunction plantar flexor Sensory sensitivity to touch in bilateral lower extremities  Results for orders placed during the hospital encounter of 06/21/13 (from the past 24 hour(s))  BASIC METABOLIC PANEL     Status: Abnormal   Collection Time    06/25/13  5:25 AM      Result Value Range   Sodium 134 (*) 137 - 147 mEq/L   Potassium 3.8  3.7 - 5.3 mEq/L   Chloride 93 (*) 96 - 112 mEq/L   CO2 31  19 - 32 mEq/L   Glucose, Bld 119 (*) 70 - 99 mg/dL   BUN 9  6 - 23 mg/dL   Creatinine, Ser 0.35 (*) 0.50 - 1.10 mg/dL   Calcium 8.5  8.4 - 10.5 mg/dL   GFR calc non Af Amer >90  >90 mL/min   GFR calc Af Amer >90  >90 mL/min  PHOSPHORUS     Status: None   Collection Time    06/25/13  5:25 AM      Result Value Range   Phosphorus 2.6  2.3 - 4.6 mg/dL  MAGNESIUM     Status: Abnormal   Collection Time    06/25/13  5:25 AM      Result Value Range   Magnesium 1.3 (*) 1.5 - 2.5 mg/dL  CBC WITH DIFFERENTIAL     Status: Abnormal   Collection Time    06/25/13  5:25 AM      Result Value Range   WBC 15.5 (*) 4.0 - 10.5 K/uL   RBC 2.73 (*) 3.87 - 5.11 MIL/uL   Hemoglobin 9.3 (*) 12.0 - 15.0 g/dL   HCT 27.7 (*) 36.0 - 46.0 %   MCV 101.5 (*) 78.0 - 100.0 fL   MCH 34.1 (*) 26.0 - 34.0 pg   MCHC 33.6  30.0 - 36.0 g/dL   RDW 14.3  11.5 - 15.5 %   Platelets 372  150 - 400 K/uL   Neutrophils Relative % 75  43 - 77 %   Neutro Abs 11.6 (*) 1.7 - 7.7 K/uL   Lymphocytes Relative 8 (*) 12 - 46 %   Lymphs Abs 1.3  0.7 - 4.0 K/uL   Monocytes Relative 16 (*) 3 - 12 %   Monocytes Absolute 2.4 (*) 0.1 - 1.0 K/uL   Eosinophils Relative 1  0 - 5 %   Eosinophils Absolute 0.1  0.0 - 0.7 K/uL   Basophils Relative 0  0 - 1 %   Basophils Absolute 0.0  0.0 - 0.1 K/uL   No results found.  Assessment/Plan: Diagnosis: Right intertrochanteric fracture of the hip 1. Does the need for close, 24 hr/day medical supervision in concert with the patient's rehab needs make it unreasonable  for this patient to be served in a less intensive setting? Potentially 2. Co-Morbidities requiring supervision/potential complications: Lung carcinoma, COPD, A. fib 3. Due to bladder management, bowel management, safety, skin/wound care, disease management, medication administration, pain management and patient education, does the patient require 24 hr/day rehab nursing? Potentially 4. Does the patient require coordinated care of a physician, rehab nurse, PT (1-2 hrs/day, 5 days/week) and OT (1-2 hrs/day, 5 days/week) to address physical and functional deficits in the context of the above medical diagnosis(es)? Potentially Addressing deficits in the following areas: balance, endurance, locomotion, strength, transferring, bowel/bladder control, bathing, dressing and toileting 5. Can the patient actively participate in an intensive therapy program of at least 3 hrs of therapy per day at least 5 days per week? Potentially 6. The potential for patient to make measurable gains while on inpatient rehab is fair 7. Anticipated functional outcomes upon discharge from inpatient rehab are supervision to min assist with PT, supervision to min assist with OT, NA with SLP. 8. Estimated rehab length of stay to reach the above functional goals is: 10-12 days 9. Does the patient have adequate social supports to accommodate these discharge functional goals? Potentially 10. Anticipated D/C setting: Home 11. Anticipated post D/C treatments: [place] therapy 12. Overall Rehab/Functional Prognosis: fair  RECOMMENDATIONS: This patient's condition is appropriate for continued rehabilitative care in the following setting: CIR Patient has agreed to participate in recommended program. Potentially Note that insurance prior authorization may be required for reimbursement for recommended care.  Comment: Daughter and grandson work full-time    06/25/2013

## 2013-06-25 NOTE — Progress Notes (Signed)
Clinical Social Work Department CLINICAL SOCIAL WORK PLACEMENT NOTE 06/25/2013  Patient:  ELYANNA, WALLICK  Account Number:  [id number] Admit date:  06/21/2013  Clinical Social Worker:  Megan Salon  Date/time:  06/25/2013 08:21 AM  Clinical Social Work is seeking post-discharge placement for this patient at the following level of care:   SKILLED NURSING   (*CSW will update this form in Epic as items are completed)   06/25/2013  Patient/family provided with [place] Department of Clinical Social Work's list of facilities offering this level of care within the geographic area requested by the patient (or if unable, by the patient's family).  06/25/2013  Patient/family informed of their freedom to choose among providers that offer the needed level of care, that participate in Medicare, Medicaid or managed care program needed by the patient, have an available bed and are willing to accept the patient.    Patient/family informed of [facility]' ownership interest in [facility], as well as of the fact that they are under no obligation to receive care at this facility.  PASARR submitted to EDS on 06/23/2013 PASARR number received from EDS on 06/23/2013  FL2 transmitted to all facilities in geographic area requested by pt/family on  06/25/2013 FL2 transmitted to all facilities within larger geographic area on   Patient informed that his/her managed care company has contracts with or will negotiate with  certain facilities, including the following:     Patient/family informed of bed offers received:   Patient chooses bed at  Physician recommends and patient chooses bed at    Patient to be transferred to  on   Patient to be transferred to facility by   The following physician request were entered in Epic:   Additional Comments:  Jeanette Caprice, MSW, [place]

## 2013-06-25 NOTE — Progress Notes (Signed)
I participated in the care of this patient and agree with the above history, physical and evaluation. I performed a review of the history and a physical exam as detailed   Stephanie Oliver  

## 2013-06-25 NOTE — Clinical Social Work Note (Signed)
Patient states that she does not want to go to SNF. Patient is open to [place] services. RNCM notified. CSW signing off at this time.   Liz Beach, [place], [place], [phone]

## 2013-06-25 NOTE — Progress Notes (Signed)
CCMD notified RN that pt converted from NSR to a-fib at 3:05am. V/s stable. Notified MD on call. Orders given. Given one time dose 5mg  lopressor. Will continue to monitor pt.

## 2013-06-25 NOTE — Progress Notes (Signed)
PULMONARY / CRITICAL CARE MEDICINE  Name: Stephanie Oliver MRN: [id number] DOB: 01-17-1951    ADMISSION DATE:  06/21/2013 CONSULTATION DATE: 06/21/2013  REFERRING MD :  [facility] PRIMARY SERVICE:  [facility]  CHIEF COMPLAINT:  Lung mass  BRIEF PATIENT DESCRIPTION: 63 y/o active smoker, ETOH abuse with reported weight loss since 2 years ago admitted for hip fracture and noted to have large rite sided lung mass.   LINE / TUBES: PICC LUE 1/8  CULTURES: None  ANTIBIOTICS: [place] 1/6>>>for OR   SIGNIFICANT EVENTS / STUDIES:  1/5 - Head CT >>>  No acute displaced skull fracture or findings to suggest acute intracranial trauma. Chronic microvascular ischemic changes in the cerebral white matter, as above.  1/5 - Chest CT >>>  7.2 x 5.0 x 6.4 cm necrotic right upper lobe mass, most consistent with primary lung malignancy. The mass appears to be locally invasive into the anterior mediastinum medially. Enlarged necrotic right hilar lymph node with smaller precarinal  node as above, worrisome for nodal metastasis. Additional subcentimeter pulmonary nodules within the right upper lobe as above, worrisome for malignancy as well. The subacute healing fracture of the left posterolateral 8th rib. Hepatic steatosis. .............Marland Kitchen 06/22/13 - EBUS RB4 mass,   RML BAL,  Endobronchial bx 06/22/13 - RIGHT HIP GAMMA NAIL FIXATION . BX sent    SUBJECTIVE:  Up to chair, R hip pain with activity.  No distress  VITAL SIGNS: Temp:  [97.2 F (36.2 C)-98.1 F (36.7 C)] 97.2 F (36.2 C) (01/09 0427) Pulse Rate:  [84-135] 101 (01/09 0427) Resp:  [16-25] 20 (01/09 0427) BP: (103-141)/(62-108) 134/82 mmHg (01/09 0427) SpO2:  [85 %-100 %] 92 % (01/09 0427)  PHYSICAL EXAMINATION: General:  Resting comfortably, appears to be in no acute distress Neuro:  Awake, follows commands, mild confusion, MAE HEENT:  NCAT, PERRL, dry membranes Neck:  Soft, no bruits, no lymphadenopathy, no carotid btuits Cardiovascular:  RRR, no  m/r/g Lungs:  Bilateral air entry, diminished R side Abdomen:  Soft, nontender, bowel sounds present, no organomegaly Musculoskeletal:  Moves all extremities, no edema, no digital clubbing Skin:  Intact, no rash   LABS: CBC  Recent Labs Lab 06/23/13 0335 06/24/13 0309 06/25/13 0525  HGB 9.2* 8.7* 9.3*  HCT 27.6* 26.4* 27.7*  WBC 9.7 10.9* 15.5*  PLT 272 264 372   CARDIAC  Recent Labs Lab 06/23/13 0830 06/23/13 1544 06/24/13 0125  TROPONINI <0.30 <0.30 <0.30    Recent Labs Lab 06/23/13 0830 06/24/13 0309  PROBNP 680.4* 995.0*   CHEMISTRY  Recent Labs Lab 06/21/13 1136 06/22/13 0520  06/23/13 0335 06/24/13 0309 06/25/13 0525  NA 135* 134*  --  137 138 134*  K 3.8 3.2*  < > 3.9 3.6* 3.8  CL 90* 92*  --  97 97 93*  CO2 31 31  --  31 30 31   GLUCOSE 101* 138*  --  101* 120* 119*  BUN 36* 24*  --  14 9 9   CREATININE 0.63 0.50  --  0.36* 0.34* 0.35*  CALCIUM 9.6 8.7  --  8.3* 8.3* 8.5  MG  --   --   --   --  1.4* 1.3*  PHOS  --   --   --   --  2.1* 2.6  < > = values in this interval not displayed. Estimated Creatinine Clearance: 58.8 ml/min (by C-G formula based on Cr of 0.35).  IMAGING x48h  No results found.   ASSESSMENT / PLAN: Principal Problem:   Intertrochanteric  fracture of right hip Active Problems:   Alcohol abuse, continuous   Hypertension   Hip fracture   Mediastinal lymphadenopathy   COPD (chronic obstructive pulmonary disease)   Tobacco use disorder   Encephalopathy acute   PAF (paroxysmal atrial fibrillation)   Non-small cell cancer of right lung   PULMONARY A: RUL Mass with enlarged mediastinal nodes.  S/p EBUS 06/22/13>>> Non small cell CA  s/p Post op Intubation 06/22/13 P:   Pulmonary Hygiene Wean oxygen for sats >92%, will need to assess for oxygen needs closer to d/c Oncology consult appreciated Lasix x1 with kcl on 1/9 Incentive spirometry  CARDIOVASCULAR A:  A flutter 06/23/13>>NSR with amio, did not tol  cardiazem ECHO EF 60% LVH P:  Lopresssor prn Trend BNP lovenox dvt proph dose PO amiodarone    RENAL A:   Normal renal function P:   Monitor BMP  GASTROINTESTINAL A:   Protein Calorie Malnutrition  P:   Diet as tolerated   HEMATOLOGIC A:   Post op anemia  P:  PRBC for hgb < 7gm% only  INFECTIOUS A:   No evidence of inection P:   Monitor fever curve / leukocytosis  ENDOCRINE A:  Hyperglycemia P:   Monitor glucose  NEUROLOGIC A:   Acute encephalopathy - hypoactive. On CIWA protocol. At risk for etoh delirium >>better P:   Monitor  MSK A: R Hip ORIF  P:  -bone bx NEG for CA   DISPO Planning -will likely need SNF vs. CIR for hip rehab  -will as CIR to evaluate   Will ask [facility] to assume medical care and PCCM to sign off.  Mariel Sleet Beeper  [phone]  Cell  [phone]  If no response or cell goes to voicemail, call beeper [phone]   06/25/2013 10:50 AM

## 2013-06-25 NOTE — Progress Notes (Signed)
Pt converted back to NSR. Notified MD. Will continue to monitor pt.

## 2013-06-25 NOTE — Progress Notes (Signed)
Pt on CIWA protocol, experiencing anxiety. Orders for ativan prn d/c'd. Spoke with Critical care on call, given one time 2mg  dose of ativan. Will continue to monitor pt.

## 2013-06-26 ENCOUNTER — Inpatient Hospital Stay (HOSPITAL_COMMUNITY)

## 2013-06-26 LAB — CBC WITH DIFFERENTIAL/PLATELET
BASOS PCT: 0 % (ref 0–1)
Basophils Absolute: 0 10*3/uL (ref 0.0–0.1)
EOS ABS: 0 10*3/uL (ref 0.0–0.7)
Eosinophils Relative: 0 % (ref 0–5)
HEMATOCRIT: 25.7 % — AB (ref 36.0–46.0)
HEMOGLOBIN: 8.8 g/dL — AB (ref 12.0–15.0)
LYMPHS ABS: 1 10*3/uL (ref 0.7–4.0)
Lymphocytes Relative: 8 % — ABNORMAL LOW (ref 12–46)
MCH: 33.8 pg (ref 26.0–34.0)
MCHC: 34.2 g/dL (ref 30.0–36.0)
MCV: 98.8 fL (ref 78.0–100.0)
MONO ABS: 1.8 10*3/uL — AB (ref 0.1–1.0)
MONOS PCT: 14 % — AB (ref 3–12)
Neutro Abs: 10.1 10*3/uL — ABNORMAL HIGH (ref 1.7–7.7)
Neutrophils Relative %: 78 % — ABNORMAL HIGH (ref 43–77)
Platelets: 348 10*3/uL (ref 150–400)
RBC: 2.6 MIL/uL — ABNORMAL LOW (ref 3.87–5.11)
RDW: 13.9 % (ref 11.5–15.5)
WBC: 13 10*3/uL — ABNORMAL HIGH (ref 4.0–10.5)

## 2013-06-26 LAB — BASIC METABOLIC PANEL
BUN: 14 mg/dL (ref 6–23)
CALCIUM: 8.7 mg/dL (ref 8.4–10.5)
CO2: 32 meq/L (ref 19–32)
CREATININE: 0.34 mg/dL — AB (ref 0.50–1.10)
Chloride: 93 mEq/L — ABNORMAL LOW (ref 96–112)
GFR calc Af Amer: >90 mL/min (ref >90)
GFR calc non Af Amer: >90 mL/min (ref >90)
GLUCOSE: 113 mg/dL — AB (ref 70–99)
Potassium: 3.8 mEq/L (ref 3.7–5.3)
Sodium: 135 mEq/L — ABNORMAL LOW (ref 137–147)

## 2013-06-26 LAB — PHOSPHORUS: Phosphorus: 3.1 mg/dL (ref 2.3–4.6)

## 2013-06-26 LAB — MAGNESIUM: Magnesium: 1.4 mg/dL — ABNORMAL LOW (ref 1.5–2.5)

## 2013-06-26 MED ORDER — LORAZEPAM 2 MG/ML IJ SOLN
1.0000 mg | Freq: Once | INTRAMUSCULAR | Status: AC
  Administered 2013-06-26: 05:00:00 1 mg via INTRAVENOUS
  Filled 2013-06-26: qty 1

## 2013-06-26 MED ORDER — MAGNESIUM SULFATE 40 MG/ML IJ SOLN: 2.0000 g | Freq: Once | INTRAMUSCULAR | Status: DC

## 2013-06-26 MED ORDER — FUROSEMIDE 10 MG/ML IJ SOLN
40.0000 mg | Freq: Three times a day (TID) | INTRAMUSCULAR | Status: DC
  Administered 2013-06-26 – 2013-06-27 (×3): 40 mg via INTRAVENOUS
  Filled 2013-06-26 (×6): qty 4

## 2013-06-26 MED ORDER — POTASSIUM CHLORIDE CRYS ER 20 MEQ PO TBCR
60.0000 meq | EXTENDED_RELEASE_TABLET | Freq: Once | ORAL | Status: AC
  Administered 2013-06-26: 13:00:00 60 meq via ORAL
  Filled 2013-06-26: qty 3

## 2013-06-26 MED ORDER — LORAZEPAM 1 MG PO TABS
1.0000 mg | ORAL_TABLET | ORAL | Status: DC | PRN
  Administered 2013-06-26 – 2013-06-29 (×6): 1 mg via ORAL
  Filled 2013-06-26 (×6): qty 1

## 2013-06-26 MED ORDER — MAGNESIUM SULFATE 40 MG/ML IJ SOLN
2.0000 g | Freq: Three times a day (TID) | INTRAMUSCULAR | Status: AC
  Administered 2013-06-26 (×2): 2 g via INTRAVENOUS
  Filled 2013-06-26 (×4): qty 50

## 2013-06-26 MED ORDER — LEVALBUTEROL HCL 0.63 MG/3ML IN NEBU
0.6300 mg | INHALATION_SOLUTION | Freq: Four times a day (QID) | RESPIRATORY_TRACT | Status: DC
  Administered 2013-06-26 – 2013-06-29 (×12): 0.63 mg via RESPIRATORY_TRACT
  Filled 2013-06-26 (×23): qty 3

## 2013-06-26 NOTE — Progress Notes (Signed)
Pt heart rate >145bpm. Pt is saying it is hard to catch her breath and states that she usually takes Xanax at home. MD notified. Orders received.

## 2013-06-26 NOTE — Progress Notes (Signed)
RN called to room with patient having similar episode as a few hours prior. Pt stated she felt like she couldn't breathe, needed to sit up and was pulling at her gown. RN coached her through her breathing. BP was elevated at 218/126. After a few minutes patient stated she was feeling better. On-call MD paged and orders for IV Ativan given. Will recheck BP in approximately one hour and continue to monitor.

## 2013-06-26 NOTE — Progress Notes (Signed)
[facility] PROGRESS NOTE  Stephanie Oliver JKD:[id number] DOB: 06-25-1950 DOA: 06/21/2013 PCP: [name], MD  HPI/Subjective: Doesn't feel very good, shortness of breath and some anxiety. Had some pain around the right hip.  Assessment/Plan: Principal Problem:   Intertrochanteric fracture of right hip Active Problems:   Alcohol abuse, continuous   Hypertension   Hip fracture   Mediastinal lymphadenopathy   COPD (chronic obstructive pulmonary disease)   Tobacco use disorder   Encephalopathy acute   PAF (paroxysmal atrial fibrillation)   Non-small cell cancer of right lung    Intertrochanteric fracture of the right hip -Patient presented with a fall, and right hip fracture. -Status post, nail fixation done by Dr. Fredonia Highland. -Continue pain control with narcotics, DVT prophylaxis with Lovenox (at least for 3 weeks).  Shortness of breath -Clinically patient does have some fluid overload with bilateral crackles and +1 pedal edema. -Chest x-ray showed developing the lateral effusion. -2-D echo showed ejection fraction of about 58%. -Started Lasix, continue oxygen, check chest x-ray in the morning.  NSCLC -The time of admission chest x-ray showed right upper lobe paratracheal mass. -FNA through the FOB showed non-small cell lung cancer. -Dr. Humphrey Rolls of the [facility] following.  Paroxysmal atrial fibrillation -Currently heart rate in the 80s, patient is on amiodarone. -Likely cannot Cardizem if patient develops tachycardia.  History of alcohol abuse -Patient said she drinks about 4 glasses of whiskey per day. -No had any problems before with withdrawal. Continue to watch for withdrawal symptoms.  Code Status: Full code Family Communication: Plan discussed with the patient. Disposition Plan: Remains inpatient, notes are confusing about going to SNF versus CIR   Consultants:  None  Procedures:  Right hip gamma nail  fixation  Antibiotics:  None   Objective: Filed Vitals:   06/26/13 1106  BP: 154/117  Pulse: 83  Temp:   Resp:     Intake/Output Summary (Last 24 hours) at 06/26/13 1200 Last data filed at 06/26/13 0802  Gross per 24 hour  Intake    710 ml  Output    850 ml  Net   -140 ml   Filed Weights   06/21/13 2006  Weight: 59.875 kg (132 lb)    Exam: General: Alert and awake, oriented x3, not in any acute distress. HEENT: anicteric sclera, pupils reactive to light and accommodation, EOMI CVS: S1-S2 clear, no murmur rubs or gallops Chest: clear to auscultation bilaterally, no wheezing, rales or rhonchi Abdomen: soft nontender, nondistended, normal bowel sounds, no organomegaly Extremities: no cyanosis, clubbing or edema noted bilaterally Neuro: Cranial nerves II-XII intact, no focal neurological deficits  Data Reviewed: Basic Metabolic Panel:  Recent Labs Lab 06/22/13 0520 06/22/13 1531 06/23/13 0335 06/24/13 0309 06/25/13 0525 06/26/13 0500  NA 134*  --  137 138 134* 135*  K 3.2* 3.9 3.9 3.6* 3.8 3.8  CL 92*  --  97 97 93* 93*  CO2 31  --  31 30 31  32  GLUCOSE 138*  --  101* 120* 119* 113*  BUN 24*  --  14 9 9 14   CREATININE 0.50  --  0.36* 0.34* 0.35* 0.34*  CALCIUM 8.7  --  8.3* 8.3* 8.5 8.7  MG  --   --   --  1.4* 1.3* 1.4*  PHOS  --   --   --  2.1* 2.6 3.1   Liver Function Tests: No results found for this basename: AST, ALT, ALKPHOS, BILITOT, PROT, ALBUMIN,  in the last 168 hours No results  found for this basename: LIPASE, AMYLASE,  in the last 168 hours No results found for this basename: AMMONIA,  in the last 168 hours CBC:  Recent Labs Lab 06/21/13 1136 06/22/13 0520 06/23/13 0335 06/24/13 0309 06/25/13 0525 06/26/13 0500  WBC 14.7* 12.0* 9.7 10.9* 15.5* 13.0*  NEUTROABS 11.0*  --   --  7.5 11.6* 10.1*  HGB 11.7* 10.4* 9.2* 8.7* 9.3* 8.8*  HCT 34.4* 31.0* 27.6* 26.4* 27.7* 25.7*  MCV 98.9 101.3* 102.6* 101.9* 101.5* 98.8  PLT 310 276 272 264  372 348   Cardiac Enzymes:  Recent Labs Lab 06/23/13 0830 06/23/13 1544 06/24/13 0125  TROPONINI <0.30 <0.30 <0.30   BNP (last 3 results)  Recent Labs  06/23/13 0830 06/24/13 0309  PROBNP 680.4* 995.0*   CBG: No results found for this basename: GLUCAP,  in the last 168 hours  Micro Recent Results (from the past 240 hour(s))  SURGICAL PCR SCREEN     Status: Abnormal   Collection Time    06/21/13 10:04 PM      Result Value Range Status   MRSA, PCR NEGATIVE  NEGATIVE Final   Staphylococcus aureus POSITIVE (*) NEGATIVE Final   Comment:            The Xpert SA Assay (FDA     approved for NASAL specimens     in patients over 69 years of age),     is one component of     a comprehensive surveillance     program.  Test performance has     been validated by [employer] for patients greater     than or equal to 58 year old.     It is not intended     to diagnose infection nor to     guide or monitor treatment.     RESULT CALLED TO, READ BACK BY AND VERIFIED WITH:     T. COOK (RN) [phone] 06/22/2013 L. LOMAX     Studies: Dg Chest Port 1 View  06/26/2013   CLINICAL DATA:  Medial right upper lobe mass, labored breathing  EXAM: PORTABLE CHEST - 1 VIEW  COMPARISON:  06/21/2013  FINDINGS: The medial right upper lobe paratracheal mass is again evident. Right hemidiaphragm is elevated. Heart is enlarged. Vascular congestion evident centrally with increased basilar atelectasis versus airspace disease. Both hemidiaphragms are partially obscured. Small effusions not excluded. No pneumothorax.  IMPRESSION: Persistent medial right upper lobe paratracheal mass.  Cardiomegaly with vascular congestion and developing basilar atelectasis versus airspace disease.  Developing small pleural effusions   Electronically Signed   By: Daryll Brod M.D.   On: 06/26/2013 08:52    Scheduled Meds: . amiodarone  200 mg Oral Q12H   Followed by  . [START ON 07/01/2013] amiodarone  200 mg Oral Daily  .  aspirin EC  325 mg Oral Q breakfast  . budesonide  0.25 mg Nebulization BID  . Chlorhexidine Gluconate Cloth  6 each Topical Daily  . docusate sodium  100 mg Oral BID  . enoxaparin (LOVENOX) injection  40 mg Subcutaneous Q24H  . folic acid  1 mg Oral Daily  . ipratropium-albuterol  3 mL Nebulization Q6H  . metoprolol succinate  100 mg Oral Daily  . multivitamin with minerals  1 tablet Oral Daily  . mupirocin ointment  1 application Nasal BID  . sertraline  100 mg Oral Daily  . sodium chloride  10-40 mL Intracatheter Q12H  . thiamine  100  mg Oral Daily   Continuous Infusions: . sodium chloride 20 mL/hr (06/24/13 1106)       Time spent: 35 minutes    [facility] A  [facility] Pager [phone] If 7PM-7AM, please contact night-coverage at [email or web address], password [facility] 06/26/2013, 12:00 PM  LOS: 5 days

## 2013-06-26 NOTE — Progress Notes (Signed)
Pt complained that she felt like she couldn't breathe. RN and NT sat pt up and tried to coach her breathing. BP was elevated, and HR was around 100. Oxygen saturation was 95%. Patient stated that she has panic attacks at times and felt like that is what she was experiencing. Pt's breathing became more relaxed and pt stated she felt better after a few minutes. Attempted to page on-call physician but got a call back from the wrong service. Pt given medication for pain. Pt no longer complaining of any distress and resting comfortably. Will continue to monitor.

## 2013-06-27 ENCOUNTER — Inpatient Hospital Stay (HOSPITAL_COMMUNITY)

## 2013-06-27 LAB — BASIC METABOLIC PANEL
BUN: 15 mg/dL (ref 6–23)
CHLORIDE: 91 meq/L — AB (ref 96–112)
CO2: 38 mEq/L — ABNORMAL HIGH (ref 19–32)
CREATININE: 0.33 mg/dL — AB (ref 0.50–1.10)
Calcium: 8.6 mg/dL (ref 8.4–10.5)
GFR calc non Af Amer: >90 mL/min (ref >90)
GLUCOSE: 98 mg/dL (ref 70–99)
Potassium: 4 mEq/L (ref 3.7–5.3)
Sodium: 138 mEq/L (ref 137–147)

## 2013-06-27 LAB — CBC WITH DIFFERENTIAL/PLATELET
Basophils Absolute: 0 10*3/uL (ref 0.0–0.1)
Basophils Relative: 0 % (ref 0–1)
EOS ABS: 0.2 10*3/uL (ref 0.0–0.7)
EOS PCT: 1 % (ref 0–5)
HEMATOCRIT: 29.8 % — AB (ref 36.0–46.0)
Hemoglobin: 10 g/dL — ABNORMAL LOW (ref 12.0–15.0)
Lymphocytes Relative: 11 % — ABNORMAL LOW (ref 12–46)
Lymphs Abs: 1.6 10*3/uL (ref 0.7–4.0)
MCH: 33.6 pg (ref 26.0–34.0)
MCHC: 33.6 g/dL (ref 30.0–36.0)
MCV: 100 fL (ref 78.0–100.0)
MONO ABS: 1.9 10*3/uL — AB (ref 0.1–1.0)
MONOS PCT: 12 % (ref 3–12)
Neutro Abs: 11.7 10*3/uL — ABNORMAL HIGH (ref 1.7–7.7)
Neutrophils Relative %: 76 % (ref 43–77)
PLATELETS: 441 10*3/uL — AB (ref 150–400)
RBC: 2.98 MIL/uL — ABNORMAL LOW (ref 3.87–5.11)
RDW: 14 % (ref 11.5–15.5)
WBC: 15.4 10*3/uL — ABNORMAL HIGH (ref 4.0–10.5)

## 2013-06-27 LAB — MAGNESIUM: Magnesium: 2.1 mg/dL (ref 1.5–2.5)

## 2013-06-27 LAB — PHOSPHORUS: Phosphorus: 3.4 mg/dL (ref 2.3–4.6)

## 2013-06-27 LAB — HEPARIN LEVEL (UNFRACTIONATED): Heparin Unfractionated: <0.1 IU/mL — ABNORMAL LOW (ref 0.30–0.70)

## 2013-06-27 MED ORDER — HEPARIN BOLUS VIA INFUSION
2000.0000 [IU] | Freq: Once | INTRAVENOUS | Status: AC
  Administered 2013-06-27: 23:00:00 2000 [IU] via INTRAVENOUS
  Filled 2013-06-27: qty 2000

## 2013-06-27 MED ORDER — METOPROLOL TARTRATE 25 MG PO TABS
25.0000 mg | ORAL_TABLET | Freq: Two times a day (BID) | ORAL | Status: DC
  Administered 2013-06-27 – 2013-06-29 (×5): 25 mg via ORAL
  Filled 2013-06-27 (×6): qty 1

## 2013-06-27 MED ORDER — AMIODARONE HCL 200 MG PO TABS
400.0000 mg | ORAL_TABLET | Freq: Two times a day (BID) | ORAL | Status: DC
  Administered 2013-06-27 – 2013-06-29 (×5): 400 mg via ORAL
  Filled 2013-06-27 (×8): qty 2

## 2013-06-27 MED ORDER — HEPARIN (PORCINE) IN NACL 100-0.45 UNIT/ML-% IJ SOLN
1100.0000 [IU]/h | INTRAMUSCULAR | Status: AC
  Administered 2013-06-27: 14:00:00 700 [IU]/h via INTRAVENOUS
  Administered 2013-06-28 (×2): 1100 [IU]/h via INTRAVENOUS
  Filled 2013-06-27 (×3): qty 250

## 2013-06-27 NOTE — Progress Notes (Signed)
[facility] PROGRESS NOTE  Stephanie Oliver ASN:[id number] DOB: [birth year] DOA: 06/21/2013 PCP: [name], MD  HPI/Subjective: Breathing much easier today, but heart rate is above 100 persistently.   Assessment/Plan: Principal Problem:   Intertrochanteric fracture of right hip Active Problems:   Alcohol abuse, continuous   Hypertension   Hip fracture   Mediastinal lymphadenopathy   COPD (chronic obstructive pulmonary disease)   Tobacco use disorder   Encephalopathy acute   PAF (paroxysmal atrial fibrillation)   Non-small cell cancer of right lung    Intertrochanteric fracture of the right hip -Patient presented with a fall, and right hip fracture. -Status post, nail fixation done by Dr. Fredonia Highland. -Continue pain control with narcotics, DVT prophylaxis with Lovenox (at least for 3 weeks).  Atrial fibrillation with RVR -Currently heart rate in the 80s, patient is on amiodarone. -Patient on 100 mg of Toprol-XL, this is decreased to Lopressor 25 twice a day. -Consulted cardiology, start heparin and increase amiodarone dose to 400 twice a day  Shortness of breath -Clinically patient does have some fluid overload with bilateral crackles and +1 pedal edema. -Chest x-ray showed developing bilateral effusion. -2-D echo showed ejection fraction of about 58%. -Lasix given yesterday, chest x-ray showed less congestion.  NSCLC -Admission chest x-ray showed right upper lobe paratracheal mass. -FNA through the FOB showed non-small cell lung cancer. -Dr. Humphrey Rolls of the [place] following.  History of alcohol abuse -Patient said she drinks about 4 glasses of whiskey per day. -No had any problems before with withdrawal. Continue to watch for withdrawal symptoms.  Code Status: Full code Family Communication: Plan discussed with the patient. Disposition Plan: Remains inpatient, notes are confusing about going to SNF versus  CIR   Consultants:  None  Procedures:  Right hip gamma nail fixation  Antibiotics:  None   Objective: Filed Vitals:   06/27/13 0951  BP: 92/60  Pulse: 137  Temp:   Resp:     Intake/Output Summary (Last 24 hours) at 06/27/13 1219 Last data filed at 06/27/13 0931  Gross per 24 hour  Intake    450 ml  Output    800 ml  Net   -350 ml   Filed Weights   06/21/13 2006  Weight: 59.875 kg (132 lb)    Exam: General: Alert and awake, oriented x3, not in any acute distress. HEENT: anicteric sclera, pupils reactive to light and accommodation, EOMI CVS: S1-S2 clear, no murmur rubs or gallops Chest: clear to auscultation bilaterally, no wheezing, rales or rhonchi Abdomen: soft nontender, nondistended, normal bowel sounds, no organomegaly Extremities: no cyanosis, clubbing or edema noted bilaterally Neuro: Cranial nerves II-XII intact, no focal neurological deficits  Data Reviewed: Basic Metabolic Panel:  Recent Labs Lab 06/23/13 0335 06/24/13 0309 06/25/13 0525 06/26/13 0500 06/27/13 0427  NA 137 138 134* 135* 138  K 3.9 3.6* 3.8 3.8 4.0  CL 97 97 93* 93* 91*  CO2 31 30 31  32 38*  GLUCOSE 101* 120* 119* 113* 98  BUN 14 9 9 14 15   CREATININE 0.36* 0.34* 0.35* 0.34* 0.33*  CALCIUM 8.3* 8.3* 8.5 8.7 8.6  MG  --  1.4* 1.3* 1.4* 2.1  PHOS  --  2.1* 2.6 3.1 3.4   Liver Function Tests: No results found for this basename: AST, ALT, ALKPHOS, BILITOT, PROT, ALBUMIN,  in the last 168 hours No results found for this basename: LIPASE, AMYLASE,  in the last 168 hours No results found for this basename: AMMONIA,  in the last  168 hours CBC:  Recent Labs Lab 06/21/13 1136  06/23/13 0335 06/24/13 0309 06/25/13 0525 06/26/13 0500 06/27/13 0427  WBC 14.7*  < > 9.7 10.9* 15.5* 13.0* 15.4*  NEUTROABS 11.0*  --   --  7.5 11.6* 10.1* 11.7*  HGB 11.7*  < > 9.2* 8.7* 9.3* 8.8* 10.0*  HCT 34.4*  < > 27.6* 26.4* 27.7* 25.7* 29.8*  MCV 98.9  < > 102.6* 101.9* 101.5* 98.8  100.0  PLT 310  < > 272 264 372 348 441*  < > = values in this interval not displayed. Cardiac Enzymes:  Recent Labs Lab 06/23/13 0830 06/23/13 1544 06/24/13 0125  TROPONINI <0.30 <0.30 <0.30   BNP (last 3 results)  Recent Labs  06/23/13 0830 06/24/13 0309  PROBNP 680.4* 995.0*   CBG: No results found for this basename: GLUCAP,  in the last 168 hours  Micro Recent Results (from the past 240 hour(s))  SURGICAL PCR SCREEN     Status: Abnormal   Collection Time    06/21/13 10:04 PM      Result Value Range Status   MRSA, PCR NEGATIVE  NEGATIVE Final   Staphylococcus aureus POSITIVE (*) NEGATIVE Final   Comment:            The Xpert SA Assay (FDA     approved for NASAL specimens     in patients over 71 years of age),     is one component of     a comprehensive surveillance     program.  Test performance has     been validated by [employer] for patients greater     than or equal to 82 year old.     It is not intended     to diagnose infection nor to     guide or monitor treatment.     RESULT CALLED TO, READ BACK BY AND VERIFIED WITH:     T. COOK (RN) [phone] 06/22/2013 L. LOMAX     Studies: Dg Chest Port 1 View  06/26/2013   CLINICAL DATA:  Medial right upper lobe mass, labored breathing  EXAM: PORTABLE CHEST - 1 VIEW  COMPARISON:  06/21/2013  FINDINGS: The medial right upper lobe paratracheal mass is again evident. Right hemidiaphragm is elevated. Heart is enlarged. Vascular congestion evident centrally with increased basilar atelectasis versus airspace disease. Both hemidiaphragms are partially obscured. Small effusions not excluded. No pneumothorax.  IMPRESSION: Persistent medial right upper lobe paratracheal mass.  Cardiomegaly with vascular congestion and developing basilar atelectasis versus airspace disease.  Developing small pleural effusions   Electronically Signed   By: Daryll Brod M.D.   On: 06/26/2013 08:52    Scheduled Meds: . amiodarone  400 mg Oral BID   . aspirin EC  325 mg Oral Q breakfast  . budesonide  0.25 mg Nebulization BID  . Chlorhexidine Gluconate Cloth  6 each Topical Daily  . docusate sodium  100 mg Oral BID  . enoxaparin (LOVENOX) injection  40 mg Subcutaneous Q24H  . folic acid  1 mg Oral Daily  . levalbuterol  0.63 mg Nebulization QID  . metoprolol tartrate  25 mg Oral BID  . multivitamin with minerals  1 tablet Oral Daily  . sertraline  100 mg Oral Daily  . sodium chloride  10-40 mL Intracatheter Q12H  . thiamine  100 mg Oral Daily   Continuous Infusions:       Time spent: 35 minutes    Aretha Levi A  [facility] Pager [phone] If 7PM-7AM, please contact night-coverage at [email or web address], password [facility] 06/27/2013, 12:19 PM  LOS: 6 days

## 2013-06-27 NOTE — Consult Note (Signed)
Reason for Consult: Recurrent atrial fibrillation Referring Physician: [facility]  Stephanie Oliver is an 63 y.o. female.  HPI: Patient is 63 year old female with past medical history significant for hypertension, tobacco abuse, alcohol abuse, history of panic attacks, depression, was admitted after a fall and sustaining right hip fracture approximately one week ago patient was noted in a hospital during workup to have right upper lobe necrotic mass possibly non-small cell CA of right lung. Cardiologic consultation is called as patient was noted to have recurrent episodes of A. fib with RVR. Patient denies any chest pain hemoptysis. Denies any palpitation lightheadedness or syncope. Denies any history of PND orthopnea leg swelling. Patient had similar episodes  2 days ago req amiodarone and Lopressor with conversion to sinus rhythm. Patient again converted back into sinus rhythm with occasional PVCs on the monitor now. Patient had 2-D echo done few days ago that showed normal LV systolic function. States she is up and about postoperatively. Denies any leg swelling.   Past Medical History  Diagnosis Date  . Depression   . Panic attacks   . Hypertension   . Smoker   . Alcohol abuse     History reviewed. No pertinent past surgical history.  Family History  Problem Relation Age of Onset  . CAD Father   . Arthritis Mother     Social History:  reports that she has been smoking Cigarettes.  She has been smoking about 1.50 packs per day. She does not have any smokeless tobacco history on file. She reports that she drinks alcohol. She reports that she does not use illicit drugs.  Allergies:  Allergies  Allergen Reactions  . Lisinopril Diarrhea and Nausea Only  . Paxil [Paroxetine] Diarrhea and Nausea Only  . Penicillins Hives    Medications: I have reviewed the patient's current medications.  Results for orders placed during the hospital encounter of 06/21/13 (from the past 48 hour(s))   BASIC METABOLIC PANEL     Status: Abnormal   Collection Time    06/26/13  5:00 AM      Result Value Range   Sodium 135 (*) 137 - 147 mEq/L   Potassium 3.8  3.7 - 5.3 mEq/L   Chloride 93 (*) 96 - 112 mEq/L   CO2 32  19 - 32 mEq/L   Glucose, Bld 113 (*) 70 - 99 mg/dL   BUN 14  6 - 23 mg/dL   Creatinine, Ser 0.34 (*) 0.50 - 1.10 mg/dL   Calcium 8.7  8.4 - 10.5 mg/dL   GFR calc non Af Amer >90  >90 mL/min   GFR calc Af Amer >90  >90 mL/min   Comment: (NOTE)     The eGFR has been calculated using the CKD EPI equation.     This calculation has not been validated in all clinical situations.     eGFR's persistently <90 mL/min signify possible Chronic Kidney     Disease.  PHOSPHORUS     Status: None   Collection Time    06/26/13  5:00 AM      Result Value Range   Phosphorus 3.1  2.3 - 4.6 mg/dL  MAGNESIUM     Status: Abnormal   Collection Time    06/26/13  5:00 AM      Result Value Range   Magnesium 1.4 (*) 1.5 - 2.5 mg/dL  CBC WITH DIFFERENTIAL     Status: Abnormal   Collection Time    06/26/13  5:00 AM  Result Value Range   WBC 13.0 (*) 4.0 - 10.5 K/uL   RBC 2.60 (*) 3.87 - 5.11 MIL/uL   Hemoglobin 8.8 (*) 12.0 - 15.0 g/dL   HCT 25.7 (*) 36.0 - 46.0 %   MCV 98.8  78.0 - 100.0 fL   MCH 33.8  26.0 - 34.0 pg   MCHC 34.2  30.0 - 36.0 g/dL   RDW 13.9  11.5 - 15.5 %   Platelets 348  150 - 400 K/uL   Neutrophils Relative % 78 (*) 43 - 77 %   Neutro Abs 10.1 (*) 1.7 - 7.7 K/uL   Lymphocytes Relative 8 (*) 12 - 46 %   Lymphs Abs 1.0  0.7 - 4.0 K/uL   Monocytes Relative 14 (*) 3 - 12 %   Monocytes Absolute 1.8 (*) 0.1 - 1.0 K/uL   Eosinophils Relative 0  0 - 5 %   Eosinophils Absolute 0.0  0.0 - 0.7 K/uL   Basophils Relative 0  0 - 1 %   Basophils Absolute 0.0  0.0 - 0.1 K/uL  BASIC METABOLIC PANEL     Status: Abnormal   Collection Time    06/27/13  4:27 AM      Result Value Range   Sodium 138  137 - 147 mEq/L   Potassium 4.0  3.7 - 5.3 mEq/L   Chloride 91 (*) 96 - 112  mEq/L   CO2 38 (*) 19 - 32 mEq/L   Glucose, Bld 98  70 - 99 mg/dL   BUN 15  6 - 23 mg/dL   Creatinine, Ser 0.33 (*) 0.50 - 1.10 mg/dL   Calcium 8.6  8.4 - 10.5 mg/dL   GFR calc non Af Amer >90  >90 mL/min   GFR calc Af Amer >90  >90 mL/min   Comment: (NOTE)     The eGFR has been calculated using the CKD EPI equation.     This calculation has not been validated in all clinical situations.     eGFR's persistently <90 mL/min signify possible Chronic Kidney     Disease.  PHOSPHORUS     Status: None   Collection Time    06/27/13  4:27 AM      Result Value Range   Phosphorus 3.4  2.3 - 4.6 mg/dL  MAGNESIUM     Status: None   Collection Time    06/27/13  4:27 AM      Result Value Range   Magnesium 2.1  1.5 - 2.5 mg/dL  CBC WITH DIFFERENTIAL     Status: Abnormal   Collection Time    06/27/13  4:27 AM      Result Value Range   WBC 15.4 (*) 4.0 - 10.5 K/uL   RBC 2.98 (*) 3.87 - 5.11 MIL/uL   Hemoglobin 10.0 (*) 12.0 - 15.0 g/dL   HCT 29.8 (*) 36.0 - 46.0 %   MCV 100.0  78.0 - 100.0 fL   MCH 33.6  26.0 - 34.0 pg   MCHC 33.6  30.0 - 36.0 g/dL   RDW 14.0  11.5 - 15.5 %   Platelets 441 (*) 150 - 400 K/uL   Neutrophils Relative % 76  43 - 77 %   Neutro Abs 11.7 (*) 1.7 - 7.7 K/uL   Lymphocytes Relative 11 (*) 12 - 46 %   Lymphs Abs 1.6  0.7 - 4.0 K/uL   Monocytes Relative 12  3 - 12 %   Monocytes Absolute 1.9 (*) 0.1 -  1.0 K/uL   Eosinophils Relative 1  0 - 5 %   Eosinophils Absolute 0.2  0.0 - 0.7 K/uL   Basophils Relative 0  0 - 1 %   Basophils Absolute 0.0  0.0 - 0.1 K/uL    Dg Chest 2 View  06/27/2013   CLINICAL DATA:  Shortness of breath, labored breathing, lung mass  EXAM: CHEST  2 VIEW  COMPARISON:  06/26/2013, 06/21/2013  FINDINGS: Right upper lobe paratracheal mass again evident. Rotated exam to the left. Left PICC line tip extends into the proximal right atrium. Mild cardiac enlargement with central vascular congestion. Increased bibasilar densities, suspect atelectasis.  Right hemidiaphragm is elevated. No pneumothorax. Atherosclerosis of the aorta.  IMPRESSION: Right upper lobe paratracheal mass, better demonstrated by CT comparison  Cardiomegaly with vascular congestion  Slight improvement in bibasilar atelectasis compared to yesterday.  Right hemidiaphragm elevation   Electronically Signed   By: Daryll Brod M.D.   On: 06/27/2013 12:19   Dg Chest Port 1 View  06/26/2013   CLINICAL DATA:  Medial right upper lobe mass, labored breathing  EXAM: PORTABLE CHEST - 1 VIEW  COMPARISON:  06/21/2013  FINDINGS: The medial right upper lobe paratracheal mass is again evident. Right hemidiaphragm is elevated. Heart is enlarged. Vascular congestion evident centrally with increased basilar atelectasis versus airspace disease. Both hemidiaphragms are partially obscured. Small effusions not excluded. No pneumothorax.  IMPRESSION: Persistent medial right upper lobe paratracheal mass.  Cardiomegaly with vascular congestion and developing basilar atelectasis versus airspace disease.  Developing small pleural effusions   Electronically Signed   By: Daryll Brod M.D.   On: 06/26/2013 08:52    Review of Systems  Constitutional: Negative for fever and chills.  Eyes: Negative for double vision, photophobia and pain.  Respiratory: Negative for cough, hemoptysis, sputum production and shortness of breath.   Cardiovascular: Negative for chest pain, palpitations, orthopnea and claudication.  Gastrointestinal: Negative for nausea, vomiting and abdominal pain.  Genitourinary: Negative for dysuria.  Neurological: Negative for dizziness and headaches.   Blood pressure 92/60, pulse 137, temperature 97.3 F (36.3 C), temperature source Oral, resp. rate 18, height _0  (1.6 m), weight 64.003 kg (141 lb 1.6 oz), SpO2 96.00%. Physical Exam  Constitutional: She is oriented to person, place, and time.  HENT:  Head: Normocephalic and atraumatic.  Eyes: Conjunctivae are normal. Left eye exhibits no  discharge. No scleral icterus.  Neck: Normal range of motion. Neck supple. No JVD present. No tracheal deviation present. No thyromegaly present.  Cardiovascular: Normal rate and regular rhythm.   Murmur (soft systolic murmur noted no S3 gallop) heard. Respiratory:  Decreased breath sound at bases   GI: Soft. Bowel sounds are normal. She exhibits no distension. There is no tenderness.  Musculoskeletal: She exhibits no edema and no tenderness.  . No evidence of DVT Right hip surgical dressing dry  Neurological: She is alert and oriented to person, place, and time.    Assessment/Plan: Recurrent paroxysmal atrial fibrillation with RVR now back in sinus rhythm Hypertension Status post recent right hip fracture Right lung necrotic mass Tobacco abuse COPD EtOH abuse Plan Agree with starting her on IV heparin and increasing amiodarone to 400 mg by mouth twice a day and reducing Lopressor to 25 mg twice daily Check TSH Check EKG daily   Stephanie Oliver 06/27/2013, 1:13 PM

## 2013-06-27 NOTE — Progress Notes (Signed)
ANTICOAGULATION CONSULT NOTE - Initial Consult  Pharmacy Consult for Heparin Indication: atrial fibrillation  Allergies  Allergen Reactions  . Lisinopril Diarrhea and Nausea Only  . Paxil [Paroxetine] Diarrhea and Nausea Only  . Penicillins Hives    Patient Measurements: Height: 5\' 3"  (160 cm) Weight: 141 lb 1.6 oz (64.003 kg) IBW/kg (Calculated) : 52.4 Heparin Dosing Weight: 64 kg  Vital Signs: Temp: 97.3 F (36.3 C) (01/11 0405) Temp src: Oral (01/11 0405) BP: 92/60 mmHg (01/11 0951) Pulse Rate: 137 (01/11 0951)  Labs:  Recent Labs  06/25/13 0525 06/26/13 0500 06/27/13 0427  HGB 9.3* 8.8* 10.0*  HCT 27.7* 25.7* 29.8*  PLT 372 348 441*  CREATININE 0.35* 0.34* 0.33*    Estimated Creatinine Clearance: 63.9 ml/min (by C-G formula based on Cr of 0.33).   Medical History: Past Medical History  Diagnosis Date  . Depression   . Panic attacks   . Hypertension   . Smoker   . Alcohol abuse     Medications:  Scheduled:  . amiodarone  400 mg Oral BID  . aspirin EC  325 mg Oral Q breakfast  . budesonide  0.25 mg Nebulization BID  . Chlorhexidine Gluconate Cloth  6 each Topical Daily  . docusate sodium  100 mg Oral BID  . enoxaparin (LOVENOX) injection  40 mg Subcutaneous Q24H  . folic acid  1 mg Oral Daily  . levalbuterol  0.63 mg Nebulization QID  . metoprolol tartrate  25 mg Oral BID  . multivitamin with minerals  1 tablet Oral Daily  . sertraline  100 mg Oral Daily  . sodium chloride  10-40 mL Intracatheter Q12H  . thiamine  100 mg Oral Daily    Assessment: 63 yo F admitted 1/5 after fall 1/2 with hip injury.  Patient went to OR 1/6 for IM nailing.  Patient was started on Lovenox for post-op VTE prophylaxis.  Since surgery the patient has developed afib with RVR and cardiology recommended initiation of heparin.  Last dose of Lovenox was 1/10 at 1800.  Goal of Therapy:  Heparin level 0.3-0.7 units/ml Monitor platelets by anticoagulation protocol: Yes    Plan:  Initiation of heparin at 700 units/hr.  Will not bolus with recent surgery and residual Lovenox on board. Heparin level in 8 hours. Heparin level and CBC daily while on heparin.  [employer], Pharm.D., BCPS Clinical Pharmacist Pager [phone] 06/27/2013 12:45 PM

## 2013-06-27 NOTE — Progress Notes (Signed)
[facility] for Heparin Indication: atrial fibrillation  Allergies  Allergen Reactions  . Lisinopril Diarrhea and Nausea Only  . Paxil [Paroxetine] Diarrhea and Nausea Only  . Penicillins Hives    Patient Measurements: Height: 5\' 3"  (160 cm) Weight: 141 lb 1.6 oz (64.003 kg) IBW/kg (Calculated) : 52.4 Heparin Dosing Weight: 64 kg  Vital Signs: Temp: 97.3 F (36.3 C) (01/11 2200) Temp src: Oral (01/11 2200) BP: 143/80 mmHg (01/11 2200) Pulse Rate: 71 (01/11 2200)  Labs:  Recent Labs  06/25/13 0525 06/26/13 0500 06/27/13 0427 06/27/13 2210  HGB 9.3* 8.8* 10.0*  --   HCT 27.7* 25.7* 29.8*  --   PLT 372 348 441*  --   HEPARINUNFRC  --   --   --  <0.10*  CREATININE 0.35* 0.34* 0.33*  --     Estimated Creatinine Clearance: 63.9 ml/min (by C-G formula based on Cr of 0.33).   Assessment: 64 yo female with aFib for heparin  Goal of Therapy:  Heparin level 0.3-0.7 units/ml Monitor platelets by anticoagulation protocol: Yes   Plan:  Heparin 2000 units IV bolus, then increase heparin 950 units/hr  Follow-up am labs.  Phillis Knack, PharmD, BCPS  06/27/2013 11:03 PM

## 2013-06-27 NOTE — Progress Notes (Signed)
Patient heart rhythm continues to flip between NSR and a fib. When patient is in afib, HR goes up to 130-150, when in NSR HR in the 80's. Dr Hartford Poli notified and stated he would address the issue. Joslyn Hy, MSN, RN, [employer]

## 2013-06-28 LAB — CBC
HCT: 28.6 % — ABNORMAL LOW (ref 36.0–46.0)
Hemoglobin: 9.9 g/dL — ABNORMAL LOW (ref 12.0–15.0)
MCH: 33.9 pg (ref 26.0–34.0)
MCHC: 34.6 g/dL (ref 30.0–36.0)
MCV: 97.9 fL (ref 78.0–100.0)
PLATELETS: 458 10*3/uL — AB (ref 150–400)
RBC: 2.92 MIL/uL — AB (ref 3.87–5.11)
RDW: 14.3 % (ref 11.5–15.5)
WBC: 12.7 10*3/uL — ABNORMAL HIGH (ref 4.0–10.5)

## 2013-06-28 LAB — HEPARIN LEVEL (UNFRACTIONATED): HEPARIN UNFRACTIONATED: 0.17 [IU]/mL — AB (ref 0.30–0.70)

## 2013-06-28 LAB — TSH: TSH: 5.444 u[IU]/mL — AB (ref 0.350–4.500)

## 2013-06-28 MED ORDER — ENSURE COMPLETE PO LIQD
237.0000 mL | Freq: Two times a day (BID) | ORAL | Status: DC
  Administered 2013-06-28 – 2013-06-29 (×3): 237 mL via ORAL

## 2013-06-28 MED ORDER — RIVAROXABAN 20 MG PO TABS
20.0000 mg | ORAL_TABLET | Freq: Every day | ORAL | Status: DC
  Administered 2013-06-28: 20 mg via ORAL
  Filled 2013-06-28 (×2): qty 1

## 2013-06-28 MED ORDER — DM-GUAIFENESIN ER 30-600 MG PO TB12
1.0000 | ORAL_TABLET | Freq: Two times a day (BID) | ORAL | Status: DC
  Administered 2013-06-28 – 2013-06-29 (×3): 1 via ORAL
  Filled 2013-06-28 (×5): qty 1

## 2013-06-28 MED ORDER — FUROSEMIDE 20 MG PO TABS
20.0000 mg | ORAL_TABLET | Freq: Every day | ORAL | Status: DC
  Administered 2013-06-28: 20 mg via ORAL
  Filled 2013-06-28 (×2): qty 1

## 2013-06-28 NOTE — Clinical Social Work Note (Addendum)
CSW spoke with daughter at length about patient's refusal of SNF. Daughter described a living environment that will not be manageable for patient at this time. CSW asked daughter to speak with patient to get her to reconsider her decision. At this time patient is saying that she would be willing to go to rehab for a week, but no longer than that.   Liz Beach, [place], [place], [phone]

## 2013-06-28 NOTE — Progress Notes (Signed)
Pt's Sp02 87 on room air. Placed back on 2LNC Sp02 above 95.

## 2013-06-28 NOTE — Progress Notes (Signed)
1652: heparin drip stopped and xarelto given per pharmacy order.

## 2013-06-28 NOTE — Progress Notes (Signed)
Subjective:  Patient denies any chest pain shortness of breath or palpitations. Remains in sinus rhythm  Objective:  Vital Signs in the last 24 hours: Temp:  [97.3 F (36.3 C)-97.5 F (36.4 C)] 97.3 F (36.3 C) (01/12 0533) Pulse Rate:  [70-141] 102 (01/12 1057) Resp:  [18] 18 (01/12 0533) BP: (143-166)/(74-92) 152/82 mmHg (01/12 1057) SpO2:  [90 %-98 %] 96 % (01/12 1158) Weight:  [64 kg (141 lb 1.5 oz)] 64 kg (141 lb 1.5 oz) (01/12 0533)  Intake/Output from previous day: 01/11 0701 - 01/12 0700 In: 579 [P.O.:450; I.V.:129] Out: 950 [Urine:950] Intake/Output from this shift: Total I/O In: 300 [P.O.:300] Out: 300 [Urine:300]  Physical Exam: Neck: no adenopathy, no carotid bruit, no JVD and supple, symmetrical, trachea midline Lungs: Decreased breath sound at bases Heart: regular rate and rhythm, S1, S2 normal and Soft systolic murmur noted Abdomen: soft, non-tender; bowel sounds normal; no masses,  no organomegaly Extremities: extremities normal, atraumatic, no cyanosis or edema  Lab Results:  Recent Labs  06/27/13 0427 06/28/13 0543  WBC 15.4* 12.7*  HGB 10.0* 9.9*  PLT 441* 458*    Recent Labs  06/26/13 0500 06/27/13 0427  NA 135* 138  K 3.8 4.0  CL 93* 91*  CO2 32 38*  GLUCOSE 113* 98  BUN 14 15  CREATININE 0.34* 0.33*   No results found for this basename: TROPONINI, CK, MB,  in the last 72 hours Hepatic Function Panel No results found for this basename: PROT, ALBUMIN, AST, ALT, ALKPHOS, BILITOT, BILIDIR, IBILI,  in the last 72 hours No results found for this basename: CHOL,  in the last 72 hours No results found for this basename: PROTIME,  in the last 72 hours  Imaging: Imaging results have been reviewed and Dg Chest 2 View  06/27/2013   CLINICAL DATA:  Shortness of breath, labored breathing, lung mass  EXAM: CHEST  2 VIEW  COMPARISON:  06/26/2013, 06/21/2013  FINDINGS: Right upper lobe paratracheal mass again evident. Rotated exam to the left. Left  PICC line tip extends into the proximal right atrium. Mild cardiac enlargement with central vascular congestion. Increased bibasilar densities, suspect atelectasis. Right hemidiaphragm is elevated. No pneumothorax. Atherosclerosis of the aorta.  IMPRESSION: Right upper lobe paratracheal mass, better demonstrated by CT comparison  Cardiomegaly with vascular congestion  Slight improvement in bibasilar atelectasis compared to yesterday.  Right hemidiaphragm elevation   Electronically Signed   By: Daryll Brod M.D.   On: 06/27/2013 12:19    Cardiac Studies:  Assessment/Plan:  Recurrent paroxysmal atrial fibrillation with RVR now back in sinus rhythm  Hypertension  Status post recent right hip fracture  Right lung necrotic mass  Tobacco abuse  COPD  EtOH abuse Plan Continue present management Reduce amiodarone to 200 twice daily after 5 days and then 200 mg daily after one week I will sign off please call if needed  LOS: 7 days    Stephanie Oliver N 06/28/2013, 12:57 PM

## 2013-06-28 NOTE — Progress Notes (Signed)
[facility] PROGRESS NOTE  Stephanie Oliver OJJ:[id number] DOB: 1950/12/02 DOA: 06/21/2013 PCP: [name], MD  HPI/Subjective: Reports she is trying to keep herself calm.  She is pleasant, but has increased work of breathing.   Assessment/Plan: Principal Problem:   Intertrochanteric fracture of right hip Active Problems:   Alcohol abuse, continuous   Hypertension   Hip fracture   Mediastinal lymphadenopathy   COPD (chronic obstructive pulmonary disease)   Tobacco use disorder   Encephalopathy acute   PAF (paroxysmal atrial fibrillation)   Non-small cell cancer of right lung    Intertrochanteric fracture of the right hip -Patient presented with a fall, and right hip fracture. -Status post, nail fixation done by Dr. Fredonia Highland. -Continue pain control with narcotics, DVT prophylaxis will change to [facility] on 1/12. -Will discharge to SNF for Rehab.  Atrial fibrillation with RVR -Currently heart rate in the 80s, patient is on amiodarone. -Patient on 100 mg of Toprol-XL, this is decreased to Lopressor 25 twice a day. -Consulted cardiology, heparin was started.  Amiodarone was increased to 400 twice a day.  -Per Dr. Terrence Dupont on 1/12: Reduce amiodarone to 200 twice daily after 5 days and then 200 mg daily after one week  Shortness of breath -Clinically patient had some fluid overload with bilateral crackles and +1 pedal edema. -Chest x-ray showed developing bilateral effusion. -2-D echo showed ejection fraction of about 58%. -Lasix given 1/10, 1/11, chest x-ray showed less congestion.  -Will start daily lasix 20 mg po.  NSCLC -Admission chest x-ray showed right upper lobe paratracheal mass. -FNA through the FOB showed non-small cell lung cancer. -Dr. Humphrey Rolls, Onc was consulted.  She indicates the patient is a good candidate for chemotherapy. - Follow up with Dr. Humphrey Rolls post discharge.  History of alcohol abuse -Patient said she drinks about 4 glasses of whiskey per  day. -No had any problems before with withdrawal. Continue to watch for withdrawal symptoms.  Code Status: Full code Family Communication: Plan discussed with the patient. Disposition Plan: Remains inpatient.  Will d/c to SNF for rehab when bed is available.   Consultants:  None  Procedures:  Right hip gamma nail fixation  Antibiotics:  None   Objective: Filed Vitals:   06/28/13 1300  BP: 125/72  Pulse: 71  Temp: 97.4 F (36.3 C)  Resp: 18    Intake/Output Summary (Last 24 hours) at 06/28/13 1522 Last data filed at 06/28/13 1410  Gross per 24 hour  Intake 669.02 ml  Output    500 ml  Net 169.02 ml   Filed Weights   06/21/13 2006 06/27/13 1220 06/28/13 0533  Weight: 59.875 kg (132 lb) 64.003 kg (141 lb 1.6 oz) 64 kg (141 lb 1.5 oz)    Exam: General: Alert and awake, oriented x3, not in any acute distress. Soft speech due to SOB. HEENT: anicteric sclera, pupils reactive to light and accommodation, EOMI CVS: S1-S2 clear, no murmur rubs or gallops Chest: no w/c/r, appears slightly sob when speaking.  Abdomen: soft nontender, nondistended, normal bowel sounds, no organomegaly Extremities: no cyanosis, clubbing or edema noted bilaterally Neuro: Cranial nerves II-XII intact, no focal neurological deficits  Data Reviewed: Basic Metabolic Panel:  Recent Labs Lab 06/23/13 0335 06/24/13 0309 06/25/13 0525 06/26/13 0500 06/27/13 0427  NA 137 138 134* 135* 138  K 3.9 3.6* 3.8 3.8 4.0  CL 97 97 93* 93* 91*  CO2 31 30 31  32 38*  GLUCOSE 101* 120* 119* 113* 98  BUN 14 9 9  14  15  CREATININE 0.36* 0.34* 0.35* 0.34* 0.33*  CALCIUM 8.3* 8.3* 8.5 8.7 8.6  MG  --  1.4* 1.3* 1.4* 2.1  PHOS  --  2.1* 2.6 3.1 3.4   CBC:  Recent Labs Lab 06/24/13 0309 06/25/13 0525 06/26/13 0500 06/27/13 0427 06/28/13 0543  WBC 10.9* 15.5* 13.0* 15.4* 12.7*  NEUTROABS 7.5 11.6* 10.1* 11.7*  --   HGB 8.7* 9.3* 8.8* 10.0* 9.9*  HCT 26.4* 27.7* 25.7* 29.8* 28.6*  MCV 101.9*  101.5* 98.8 100.0 97.9  PLT 264 372 348 441* 458*   Cardiac Enzymes:  Recent Labs Lab 06/23/13 0830 06/23/13 1544 06/24/13 0125  TROPONINI <0.30 <0.30 <0.30   BNP (last 3 results)  Recent Labs  06/23/13 0830 06/24/13 0309  PROBNP 680.4* 995.0*    Micro Recent Results (from the past 240 hour(s))  SURGICAL PCR SCREEN     Status: Abnormal   Collection Time    06/21/13 10:04 PM      Result Value Range Status   MRSA, PCR NEGATIVE  NEGATIVE Final   Staphylococcus aureus POSITIVE (*) NEGATIVE Final   Comment:            The Xpert SA Assay (FDA     approved for NASAL specimens     in patients over 38 years of age),     is one component of     a comprehensive surveillance     program.  Test performance has     been validated by [employer] for patients greater     than or equal to 18 year old.     It is not intended     to diagnose infection nor to     guide or monitor treatment.     RESULT CALLED TO, READ BACK BY AND VERIFIED WITH:     T. COOK (RN) [phone] 06/22/2013 L. LOMAX     Studies: Dg Chest 2 View  06/27/2013   CLINICAL DATA:  Shortness of breath, labored breathing, lung mass  EXAM: CHEST  2 VIEW  COMPARISON:  06/26/2013, 06/21/2013  FINDINGS: Right upper lobe paratracheal mass again evident. Rotated exam to the left. Left PICC line tip extends into the proximal right atrium. Mild cardiac enlargement with central vascular congestion. Increased bibasilar densities, suspect atelectasis. Right hemidiaphragm is elevated. No pneumothorax. Atherosclerosis of the aorta.  IMPRESSION: Right upper lobe paratracheal mass, better demonstrated by CT comparison  Cardiomegaly with vascular congestion  Slight improvement in bibasilar atelectasis compared to yesterday.  Right hemidiaphragm elevation   Electronically Signed   By: Daryll Brod M.D.   On: 06/27/2013 12:19    Scheduled Meds: . amiodarone  400 mg Oral BID  . aspirin EC  325 mg Oral Q breakfast  . budesonide  0.25 mg  Nebulization BID  . dextromethorphan-guaiFENesin  1 tablet Oral BID  . docusate sodium  100 mg Oral BID  . folic acid  1 mg Oral Daily  . furosemide  20 mg Oral Daily  . levalbuterol  0.63 mg Nebulization QID  . metoprolol tartrate  25 mg Oral BID  . multivitamin with minerals  1 tablet Oral Daily  . sertraline  100 mg Oral Daily  . sodium chloride  10-40 mL Intracatheter Q12H  . thiamine  100 mg Oral Daily   Continuous Infusions: . heparin 1,100 Units/hr (06/28/13 1500)     Melton Alar  [facility] Pager [phone] If 7PM-7AM, please contact night-coverage at [email or web address], password [facility]  06/28/2013, 3:22 PM  LOS: 7 days

## 2013-06-28 NOTE — Progress Notes (Signed)
ANTICOAGULATION CONSULT NOTE - Follow Up Consult  Pharmacy Consult for Heparin -> Xarelto Indication: atrial fibrillation  Allergies  Allergen Reactions  . Lisinopril Diarrhea and Nausea Only  . Paxil [Paroxetine] Diarrhea and Nausea Only  . Penicillins Hives    Patient Measurements: Height: 5\' 3"  (160 cm) Weight: 141 lb 1.5 oz (64 kg) IBW/kg (Calculated) : 52.4  Vital Signs: Temp: 97.4 F (36.3 C) (01/12 1300) Temp src: Oral (01/12 1300) BP: 125/72 mmHg (01/12 1300) Pulse Rate: 71 (01/12 1300)  Labs:  Recent Labs  06/26/13 0500 06/27/13 0427 06/27/13 2210 06/28/13 0543  HGB 8.8* 10.0*  --  9.9*  HCT 25.7* 29.8*  --  28.6*  PLT 348 441*  --  458*  HEPARINUNFRC  --   --  <0.10* 0.17*  CREATININE 0.34* 0.33*  --   --     Estimated Creatinine Clearance: 63.9 ml/min (by C-G formula based on Cr of 0.33).  Assessment:   Heparin drip increased this morning, but now to stop and begin Xarelto for non-valvular atrial fibrillation.  On amiodarone. Also on Aspirin 325 mg daily. POD#6 right IM nail.  Noted ongoing alcohol abuse. No LFTs this admit.  Goal of Therapy:  appropriate Xarelto dose for indication and renal function Monitor platelets by anticoagulation protocol: Yes   Plan:   Xarelto 20 mg daily with supper to begin today.  Heparin drip to stop at the time of 1st Xarelto dose.  Cmet in am, to check LFTs.  CBC in am.  Xarelto education prior to discharge.   Decrease Aspirin to 81 mg daily?  Arty Baumgartner, [place] Pager: [phone] 06/28/2013,4:55 PM

## 2013-06-28 NOTE — Progress Notes (Signed)
Addendum  Patient seen and examined, chart and data base reviewed.  I agree with the above assessment and plan.  For full details please see Mrs. Imogene Burn PA note.   Birdie Hopes, MD [facility] Pager: [phone] 06/28/2013, 5:37 PM

## 2013-06-28 NOTE — Progress Notes (Signed)
Occupational Therapy Treatment Patient Details Name: Ermelinda Eckert MRN: [id number] DOB: 1950-10-15 Today's Date: 06/28/2013 Time: 2542-7062 OT Time Calculation (min): 29 min  OT Assessment / Plan / Recommendation  History of present illness 63 yo active smoker with reported weight loss since 2 years ago admitted for hip fracture sustained after a fall at home and underwent Rt hip gamma nail fixation 06/22/13 and is WBAT.  Chest x-ray showeed large rite sided lung mass and she underwent a biopsy during surgery 06/22/13.  Initial pathology report consistent with NSCLC. Pt with h/o ETOH abuse   OT comments  Pt making slow progress with functional mobility and BADLs.  Continues to require max A with all LB ADLs.  Pt. Fatigues quickly.  Long discussion re: need for rehab.  Pt initially insisting that she needs to go home due to having much to do; however, discussed logistics of this with her, and at this time, she is now agreeing to rehab.    Follow Up Recommendations  CIR;Supervision/Assistance - 24 hour    Barriers to Discharge       Equipment Recommendations  None recommended by OT    Recommendations for Other Services    Frequency Min 2X/week   Progress towards OT Goals Progress towards OT goals: Progressing toward goals  Plan Discharge plan needs to be updated    Precautions / Restrictions Precautions Precautions: Fall Restrictions Weight Bearing Restrictions: Yes RLE Weight Bearing: Weight bearing as tolerated   Pertinent Vitals/Pain     ADL  Lower Body Bathing: Maximal assistance Where Assessed - Lower Body Bathing: Supported sit to stand Lower Body Dressing: +1 Total assistance Where Assessed - Lower Body Dressing: Supported sit to stand Toilet Transfer: Maximal assistance Toilet Transfer Method: Sit to stand Equipment Used: Rolling walker Transfers/Ambulation Related to ADLs: mod - max A sit to stand ADL Comments: Pt unable to access feet for LB ADLs.  Pt fatigues quickly     OT Diagnosis:    OT Problem List:   OT Treatment Interventions:     OT Goals(current goals can now be found in the care plan section) Acute Rehab OT Goals Time For Goal Achievement: 07/08/13 Potential to Achieve Goals: Good ADL Goals Pt Will Perform Grooming: with min assist;standing Pt Will Perform Lower Body Bathing: with min assist;sit to/from stand;with adaptive equipment Pt Will Perform Lower Body Dressing: with min assist;with adaptive equipment;sit to/from stand Pt Will Transfer to Toilet: with min assist;ambulating;regular height toilet;grab bars Pt Will Perform Toileting - Clothing Manipulation and hygiene: with min assist;sit to/from stand  Visit Information  Last OT Received On: 06/28/13 Assistance Needed: +2 History of Present Illness: 63 yo active smoker with reported weight loss since 2 years ago admitted for hip fracture sustained after a fall at home and underwent Rt hip gamma nail fixation 06/22/13 and is WBAT.  Chest x-ray showeed large rite sided lung mass and she underwent a biopsy during surgery 06/22/13.  Initial pathology report consistent with NSCLC. Pt with h/o ETOH abuse    Subjective Data      Prior Functioning       Cognition  Cognition Arousal/Alertness: Awake/alert Behavior During Therapy: Anxious Overall Cognitive Status: Within Functional Limits for tasks assessed    Mobility       Exercises      Balance    End of Session OT - End of Session Equipment Utilized During Treatment: Rolling walker;Oxygen Activity Tolerance: Patient limited by fatigue;Patient limited by pain Patient left: in bed;with call bell/phone  within reach;with bed alarm set Nurse Communication: Patient requests pain meds  [place], Ikaika Showers M 06/28/2013, 1:07 PM

## 2013-06-28 NOTE — Progress Notes (Signed)
PT Cancellation Note  Patient Details Name: Stephanie Oliver MRN: [id number] DOB: 27-May-1951   Cancelled Treatment:    Reason Eval/Treat Not Completed: Fatigue/lethargy limiting ability to participate;Patient declined, no reason specified.  Attempted to see patient this am at 9:15.  Patient declined PT services today.  Unable to return later in day.  Will return tomorrow for PT session.   Despina Pole 06/28/2013, 8:07 PM Carita Pian. Sanjuana Kava, [place] Pager [phone]

## 2013-06-28 NOTE — Progress Notes (Signed)
NUTRITION FOLLOW-UP  DOCUMENTATION CODES Per approved criteria  -Not Applicable   INTERVENTION: Add Ensure Complete po BID, each supplement provides 350 kcal and 13 grams of protein. RD to continue to monitor.  NUTRITION DIAGNOSIS: Predicted suboptimal energy intake related to recent surgery and decreased appetite as evidenced by poor meal completion and history.  Goal: Pt to meet >/= 90% of their estimated nutrition needs   Monitor:  PO intake Weight trends Labs  ASSESSMENT: 63 yo active smoker with reported weight loss since 2 years ago admitted for hip fracture and noted to have large R sided lung mass.  Underwent R hip nail fixation and bronchoscopy on 1/7. Pt found incidentally to have a large 7.2 x 5.0 x 6.4 cm necrotic right upper lobe mass, most consistent with primary lung malignancy. Has increased work of breath presently.  Meal intake is generally poor, pt consuming at most 50% of meals. Agreeable to oral nutrition supplements.   Height: Ht Readings from Last 1 Encounters:  06/21/13 5\' 3"  (1.6 m)    Weight: Wt Readings from Last 1 Encounters:  06/28/13 141 lb 1.5 oz (64 kg)  Admit wt 132 lb - wt increased  BMI:  Body mass index is 25 kg/(m^2).  Estimated Nutritional Needs: Kcal: 1500-1700 Protein: 70-80 grams Fluid: >/= 1.7 L/day  Skin: right hip incision  Diet Order: General  EDUCATION NEEDS: -No education needs identified at this time   Intake/Output Summary (Last 24 hours) at 06/28/13 1524 Last data filed at 06/28/13 1410  Gross per 24 hour  Intake 669.02 ml  Output    500 ml  Net 169.02 ml    Last BM: 1/10  Labs:   Recent Labs Lab 06/25/13 0525 06/26/13 0500 06/27/13 0427  NA 134* 135* 138  K 3.8 3.8 4.0  CL 93* 93* 91*  CO2 31 32 38*  BUN 9 14 15   CREATININE 0.35* 0.34* 0.33*  CALCIUM 8.5 8.7 8.6  MG 1.3* 1.4* 2.1  PHOS 2.6 3.1 3.4  GLUCOSE 119* 113* 98    CBG (last 3)  No results found for this basename: GLUCAP,  in  the last 72 hours  Scheduled Meds: . amiodarone  400 mg Oral BID  . aspirin EC  325 mg Oral Q breakfast  . budesonide  0.25 mg Nebulization BID  . dextromethorphan-guaiFENesin  1 tablet Oral BID  . docusate sodium  100 mg Oral BID  . folic acid  1 mg Oral Daily  . furosemide  20 mg Oral Daily  . levalbuterol  0.63 mg Nebulization QID  . metoprolol tartrate  25 mg Oral BID  . multivitamin with minerals  1 tablet Oral Daily  . sertraline  100 mg Oral Daily  . sodium chloride  10-40 mL Intracatheter Q12H  . thiamine  100 mg Oral Daily    Continuous Infusions: . heparin 1,100 Units/hr (06/28/13 1500)   Inda Coke MS, RD, LDN Pager: [phone] After-hours pager: [phone]

## 2013-06-28 NOTE — Progress Notes (Signed)
ANTICOAGULATION CONSULT NOTE - Follow Up  Pharmacy Consult for Heparin Indication: atrial fibrillation  Allergies  Allergen Reactions  . Lisinopril Diarrhea and Nausea Only  . Paxil [Paroxetine] Diarrhea and Nausea Only  . Penicillins Hives   Patient Measurements: Height: 5\' 3"  (160 cm) Weight: 141 lb 1.5 oz (64 kg) IBW/kg (Calculated) : 52.4 Heparin Dosing Weight: 64 kg  Vital Signs: Temp: 97.3 F (36.3 C) (01/12 0533) Temp src: Oral (01/12 0533) BP: 166/92 mmHg (01/12 0533) Pulse Rate: 70 (01/12 0533)  Labs:  Recent Labs  06/26/13 0500 06/27/13 0427 06/27/13 2210 06/28/13 0543  HGB 8.8* 10.0*  --  9.9*  HCT 25.7* 29.8*  --  28.6*  PLT 348 441*  --  458*  HEPARINUNFRC  --   --  <0.10* 0.17*  CREATININE 0.34* 0.33*  --   --    Estimated Creatinine Clearance: 63.9 ml/min (by C-G formula based on Cr of 0.33).  Medical History: Past Medical History  Diagnosis Date  . Depression   . Panic attacks   . Hypertension   . Smoker   . Alcohol abuse    Medications:  Scheduled:  . amiodarone  400 mg Oral BID  . aspirin EC  325 mg Oral Q breakfast  . budesonide  0.25 mg Nebulization BID  . docusate sodium  100 mg Oral BID  . folic acid  1 mg Oral Daily  . levalbuterol  0.63 mg Nebulization QID  . metoprolol tartrate  25 mg Oral BID  . multivitamin with minerals  1 tablet Oral Daily  . sertraline  100 mg Oral Daily  . sodium chloride  10-40 mL Intracatheter Q12H  . thiamine  100 mg Oral Daily   Assessment: 63 yo F admitted 1/5 after fall 1/2 with hip injury.  Patient went to OR 1/6 for IM nailing.  Patient was started on Lovenox for post-op VTE prophylaxis.  Since surgery the patient has developed afib with RVR and cardiology recommended initiation of heparin.  Her heparin level this morning was 0.17 on IV rate of 950 units/hr.  I spoke with the patient and reviewed the IV infusions and no noted problems from either.  Her CBC is stable and no noted bleeding  complications.  Goal of Therapy:  Heparin level 0.3-0.7 units/ml Monitor platelets by anticoagulation protocol: Yes   Plan:  1).  Will titrate Heparin rate to 1100 units/hr 2).  Heparin level in 8 hours. 3).  Heparin level and CBC daily while on heparin.  Rober Minion, PharmD., MS Clinical Pharmacist Pager:  [phone] Thank you for allowing pharmacy to be part of this patients care team. 06/28/2013 10:47 AM

## 2013-06-29 ENCOUNTER — Inpatient Hospital Stay (HOSPITAL_COMMUNITY)
Admission: RE | Admit: 2013-06-29 | Discharge: 2013-07-13 | DRG: 945 | Disposition: A | Discharge status: Home / Self Care | Source: Intra-hospital | Attending: Physical Medicine & Rehabilitation | Admitting: Physical Medicine & Rehabilitation

## 2013-06-29 ENCOUNTER — Encounter (HOSPITAL_COMMUNITY): Payer: Self-pay | Admitting: *Deleted

## 2013-06-29 DIAGNOSIS — R350 Frequency of micturition: Secondary | ICD-10-CM | POA: Diagnosis not present

## 2013-06-29 DIAGNOSIS — F17200 Nicotine dependence, unspecified, uncomplicated: Secondary | ICD-10-CM | POA: Diagnosis present

## 2013-06-29 DIAGNOSIS — C3490 Malignant neoplasm of unspecified part of unspecified bronchus or lung: Secondary | ICD-10-CM

## 2013-06-29 DIAGNOSIS — I480 Paroxysmal atrial fibrillation: Secondary | ICD-10-CM | POA: Diagnosis present

## 2013-06-29 DIAGNOSIS — T502X5A Adverse effect of carbonic-anhydrase inhibitors, benzothiadiazides and other diuretics, initial encounter: Secondary | ICD-10-CM | POA: Diagnosis present

## 2013-06-29 DIAGNOSIS — Z5189 Encounter for other specified aftercare: Secondary | ICD-10-CM | POA: Diagnosis present

## 2013-06-29 DIAGNOSIS — S72009A Fracture of unspecified part of neck of unspecified femur, initial encounter for closed fracture: Secondary | ICD-10-CM | POA: Diagnosis present

## 2013-06-29 DIAGNOSIS — Z7982 Long term (current) use of aspirin: Secondary | ICD-10-CM

## 2013-06-29 DIAGNOSIS — D62 Acute posthemorrhagic anemia: Secondary | ICD-10-CM | POA: Diagnosis present

## 2013-06-29 DIAGNOSIS — D72829 Elevated white blood cell count, unspecified: Secondary | ICD-10-CM | POA: Diagnosis present

## 2013-06-29 DIAGNOSIS — F1010 Alcohol abuse, uncomplicated: Secondary | ICD-10-CM | POA: Diagnosis present

## 2013-06-29 DIAGNOSIS — G4700 Insomnia, unspecified: Secondary | ICD-10-CM | POA: Diagnosis present

## 2013-06-29 DIAGNOSIS — Z79899 Other long term (current) drug therapy: Secondary | ICD-10-CM | POA: Diagnosis not present

## 2013-06-29 DIAGNOSIS — W19XXXA Unspecified fall, initial encounter: Secondary | ICD-10-CM | POA: Diagnosis present

## 2013-06-29 DIAGNOSIS — C3400 Malignant neoplasm of unspecified main bronchus: Secondary | ICD-10-CM | POA: Diagnosis present

## 2013-06-29 DIAGNOSIS — I10 Essential (primary) hypertension: Secondary | ICD-10-CM | POA: Diagnosis present

## 2013-06-29 DIAGNOSIS — R443 Hallucinations, unspecified: Secondary | ICD-10-CM | POA: Diagnosis present

## 2013-06-29 DIAGNOSIS — R262 Difficulty in walking, not elsewhere classified: Secondary | ICD-10-CM | POA: Diagnosis present

## 2013-06-29 DIAGNOSIS — R05 Cough: Secondary | ICD-10-CM | POA: Diagnosis present

## 2013-06-29 DIAGNOSIS — I4891 Unspecified atrial fibrillation: Secondary | ICD-10-CM | POA: Diagnosis present

## 2013-06-29 DIAGNOSIS — F341 Dysthymic disorder: Secondary | ICD-10-CM | POA: Diagnosis present

## 2013-06-29 DIAGNOSIS — F410 Panic disorder [episodic paroxysmal anxiety] without agoraphobia: Secondary | ICD-10-CM | POA: Diagnosis present

## 2013-06-29 DIAGNOSIS — E876 Hypokalemia: Secondary | ICD-10-CM | POA: Diagnosis present

## 2013-06-29 DIAGNOSIS — S72143A Displaced intertrochanteric fracture of unspecified femur, initial encounter for closed fracture: Secondary | ICD-10-CM

## 2013-06-29 DIAGNOSIS — R590 Localized enlarged lymph nodes: Secondary | ICD-10-CM | POA: Diagnosis present

## 2013-06-29 DIAGNOSIS — G9340 Encephalopathy, unspecified: Secondary | ICD-10-CM

## 2013-06-29 DIAGNOSIS — R059 Cough, unspecified: Secondary | ICD-10-CM | POA: Diagnosis present

## 2013-06-29 DIAGNOSIS — J449 Chronic obstructive pulmonary disease, unspecified: Secondary | ICD-10-CM | POA: Diagnosis present

## 2013-06-29 LAB — COMPREHENSIVE METABOLIC PANEL
ALK PHOS: 156 U/L — AB (ref 39–117)
ALT: 51 U/L — AB (ref 0–35)
AST: 41 U/L — AB (ref 0–37)
Albumin: 2.3 g/dL — ABNORMAL LOW (ref 3.5–5.2)
BUN: 11 mg/dL (ref 6–23)
CHLORIDE: 89 meq/L — AB (ref 96–112)
CO2: 35 mEq/L — ABNORMAL HIGH (ref 19–32)
Calcium: 8.3 mg/dL — ABNORMAL LOW (ref 8.4–10.5)
Creatinine, Ser: 0.39 mg/dL — ABNORMAL LOW (ref 0.50–1.10)
GFR calc Af Amer: >90 mL/min (ref >90)
GFR calc non Af Amer: >90 mL/min (ref >90)
Glucose, Bld: 135 mg/dL — ABNORMAL HIGH (ref 70–99)
POTASSIUM: 3.4 meq/L — AB (ref 3.7–5.3)
SODIUM: 134 meq/L — AB (ref 137–147)
TOTAL PROTEIN: 5.8 g/dL — AB (ref 6.0–8.3)
Total Bilirubin: 1.4 mg/dL — ABNORMAL HIGH (ref 0.3–1.2)

## 2013-06-29 LAB — CBC
HCT: 28.8 % — ABNORMAL LOW (ref 36.0–46.0)
HEMOGLOBIN: 9.6 g/dL — AB (ref 12.0–15.0)
MCH: 32.9 pg (ref 26.0–34.0)
MCHC: 33.3 g/dL (ref 30.0–36.0)
MCV: 98.6 fL (ref 78.0–100.0)
PLATELETS: 459 10*3/uL — AB (ref 150–400)
RBC: 2.92 MIL/uL — ABNORMAL LOW (ref 3.87–5.11)
RDW: 13.9 % (ref 11.5–15.5)
WBC: 13.6 10*3/uL — ABNORMAL HIGH (ref 4.0–10.5)

## 2013-06-29 MED ORDER — AMIODARONE HCL 200 MG PO TABS
400.0000 mg | ORAL_TABLET | Freq: Two times a day (BID) | ORAL | Status: AC
  Administered 2013-06-29 – 2013-07-01 (×5): 400 mg via ORAL
  Filled 2013-06-29 (×5): qty 2

## 2013-06-29 MED ORDER — AMIODARONE HCL 200 MG PO TABS: 200.0000 mg | ORAL_TABLET | Freq: Every day | ORAL | Status: DC

## 2013-06-29 MED ORDER — ASPIRIN EC 325 MG PO TBEC: 325.0000 mg | DELAYED_RELEASE_TABLET | Freq: Every day | ORAL | Status: DC

## 2013-06-29 MED ORDER — LEVALBUTEROL HCL 0.63 MG/3ML IN NEBU
0.6300 mg | INHALATION_SOLUTION | Freq: Four times a day (QID) | RESPIRATORY_TRACT | Status: DC
Start: 2013-06-29 — End: 2013-06-30
  Administered 2013-06-29: 0.63 mg via RESPIRATORY_TRACT
  Filled 2013-06-29 (×6): qty 3

## 2013-06-29 MED ORDER — METOPROLOL TARTRATE 25 MG PO TABS: 25.0000 mg | ORAL_TABLET | Freq: Two times a day (BID) | ORAL | Status: DC

## 2013-06-29 MED ORDER — AMIODARONE HCL 200 MG PO TABS
400.0000 mg | ORAL_TABLET | Freq: Two times a day (BID) | ORAL | Status: DC
  Administered 2013-06-29: 400 mg via ORAL
  Filled 2013-06-29 (×2): qty 2

## 2013-06-29 MED ORDER — ADULT MULTIVITAMIN W/MINERALS CH
1.0000 | ORAL_TABLET | Freq: Every day | ORAL | Status: DC
  Administered 2013-06-30 – 2013-07-13 (×14): 1 via ORAL
  Filled 2013-06-29 (×16): qty 1

## 2013-06-29 MED ORDER — POTASSIUM CHLORIDE CRYS ER 20 MEQ PO TBCR
40.0000 meq | EXTENDED_RELEASE_TABLET | Freq: Once | ORAL | Status: AC
  Administered 2013-06-29: 40 meq via ORAL
  Filled 2013-06-29: qty 2

## 2013-06-29 MED ORDER — METOPROLOL TARTRATE 25 MG PO TABS
25.0000 mg | ORAL_TABLET | Freq: Two times a day (BID) | ORAL | Status: DC
  Administered 2013-06-29 – 2013-07-13 (×27): 25 mg via ORAL
  Filled 2013-06-29 (×31): qty 1

## 2013-06-29 MED ORDER — BUDESONIDE 0.25 MG/2ML IN SUSP: 0.2500 mg | Freq: Two times a day (BID) | RESPIRATORY_TRACT | Status: DC

## 2013-06-29 MED ORDER — ENOXAPARIN SODIUM 80 MG/0.8ML ~~LOC~~ SOLN
80.0000 mg | SUBCUTANEOUS | Status: DC
  Filled 2013-06-29: qty 0.8

## 2013-06-29 MED ORDER — ENOXAPARIN SODIUM 100 MG/ML ~~LOC~~ SOLN: 1.5000 mg/kg | SUBCUTANEOUS | Status: DC

## 2013-06-29 MED ORDER — PROCHLORPERAZINE MALEATE 5 MG PO TABS
5.0000 mg | ORAL_TABLET | Freq: Four times a day (QID) | ORAL | Status: DC | PRN
  Filled 2013-06-29: qty 2

## 2013-06-29 MED ORDER — FOLIC ACID 1 MG PO TABS
1.0000 mg | ORAL_TABLET | Freq: Every day | ORAL | Status: DC
  Administered 2013-06-30 – 2013-07-13 (×14): 1 mg via ORAL
  Filled 2013-06-29 (×17): qty 1

## 2013-06-29 MED ORDER — TRAMADOL HCL 50 MG PO TABS
50.0000 mg | ORAL_TABLET | Freq: Four times a day (QID) | ORAL | Status: DC | PRN
  Administered 2013-07-01 – 2013-07-12 (×4): 50 mg via ORAL
  Filled 2013-06-29 (×4): qty 1

## 2013-06-29 MED ORDER — CLONAZEPAM 1 MG PO TABS
1.0000 mg | ORAL_TABLET | Freq: Every day | ORAL | Status: DC
  Administered 2013-06-29: 1 mg via ORAL
  Filled 2013-06-29: qty 1

## 2013-06-29 MED ORDER — AMIODARONE HCL 200 MG PO TABS: 200.0000 mg | ORAL_TABLET | Freq: Two times a day (BID) | ORAL | Status: DC

## 2013-06-29 MED ORDER — ASPIRIN 325 MG PO TBEC: 325.0000 mg | DELAYED_RELEASE_TABLET | Freq: Every day | ORAL | Status: DC

## 2013-06-29 MED ORDER — LORAZEPAM 0.5 MG PO TABS
0.5000 mg | ORAL_TABLET | Freq: Four times a day (QID) | ORAL | Status: DC | PRN
  Administered 2013-06-29 – 2013-07-04 (×12): 0.5 mg via ORAL
  Filled 2013-06-29 (×12): qty 1

## 2013-06-29 MED ORDER — ALUM & MAG HYDROXIDE-SIMETH 200-200-20 MG/5ML PO SUSP: 30.0000 mL | ORAL | Status: DC | PRN

## 2013-06-29 MED ORDER — DM-GUAIFENESIN ER 30-600 MG PO TB12
1.0000 | ORAL_TABLET | Freq: Two times a day (BID) | ORAL | Status: DC
  Administered 2013-06-29 – 2013-07-13 (×28): 1 via ORAL
  Filled 2013-06-29 (×33): qty 1

## 2013-06-29 MED ORDER — ALPRAZOLAM 0.5 MG PO TABS: 0.5000 mg | ORAL_TABLET | Freq: Two times a day (BID) | ORAL | Status: DC | PRN

## 2013-06-29 MED ORDER — PROCHLORPERAZINE 25 MG RE SUPP
12.5000 mg | Freq: Four times a day (QID) | RECTAL | Status: DC | PRN
  Filled 2013-06-29: qty 1

## 2013-06-29 MED ORDER — AMIODARONE HCL 200 MG PO TABS
200.0000 mg | ORAL_TABLET | Freq: Every day | ORAL | Status: DC
  Administered 2013-07-09 – 2013-07-13 (×5): 200 mg via ORAL
  Filled 2013-06-29 (×6): qty 1

## 2013-06-29 MED ORDER — DOCUSATE SODIUM 100 MG PO CAPS
100.0000 mg | ORAL_CAPSULE | Freq: Two times a day (BID) | ORAL | Status: DC
  Administered 2013-06-29: 100 mg via ORAL
  Filled 2013-06-29 (×2): qty 1

## 2013-06-29 MED ORDER — POTASSIUM CHLORIDE CRYS ER 10 MEQ PO TBCR
10.0000 meq | EXTENDED_RELEASE_TABLET | Freq: Two times a day (BID) | ORAL | Status: DC
  Administered 2013-06-29: 10 meq via ORAL
  Filled 2013-06-29 (×4): qty 1

## 2013-06-29 MED ORDER — ACETAMINOPHEN 325 MG PO TABS: 325.0000 mg | ORAL_TABLET | ORAL | Status: DC | PRN

## 2013-06-29 MED ORDER — ENSURE COMPLETE PO LIQD
237.0000 mL | Freq: Two times a day (BID) | ORAL | Status: DC
  Administered 2013-06-30 – 2013-07-10 (×10): 237 mL via ORAL

## 2013-06-29 MED ORDER — DM-GUAIFENESIN ER 30-600 MG PO TB12: 1.0000 | ORAL_TABLET | Freq: Two times a day (BID) | ORAL | Status: DC

## 2013-06-29 MED ORDER — HYDROCODONE-ACETAMINOPHEN 5-325 MG PO TABS
1.0000 | ORAL_TABLET | Freq: Four times a day (QID) | ORAL | Status: DC | PRN
  Administered 2013-06-30 – 2013-07-01 (×6): 2 via ORAL
  Administered 2013-07-02: 1 via ORAL
  Administered 2013-07-02 (×2): 2 via ORAL
  Administered 2013-07-02 – 2013-07-03 (×3): 1 via ORAL
  Administered 2013-07-04 – 2013-07-05 (×3): 2 via ORAL
  Administered 2013-07-06 (×2): 1 via ORAL
  Administered 2013-07-07 – 2013-07-10 (×9): 2 via ORAL
  Administered 2013-07-11 (×2): 1 via ORAL
  Administered 2013-07-12: 2 via ORAL
  Filled 2013-06-29 (×4): qty 2
  Filled 2013-06-29: qty 1
  Filled 2013-06-29 (×4): qty 2
  Filled 2013-06-29: qty 1
  Filled 2013-06-29 (×12): qty 2
  Filled 2013-06-29: qty 1
  Filled 2013-06-29: qty 2
  Filled 2013-06-29: qty 1
  Filled 2013-06-29 (×2): qty 2
  Filled 2013-06-29: qty 1

## 2013-06-29 MED ORDER — FLEET ENEMA 7-19 GM/118ML RE ENEM
1.0000 | ENEMA | Freq: Once | RECTAL | Status: AC | PRN
  Filled 2013-06-29: qty 1

## 2013-06-29 MED ORDER — ACETAMINOPHEN 325 MG PO TABS: 650.0000 mg | ORAL_TABLET | Freq: Four times a day (QID) | ORAL | Status: DC | PRN

## 2013-06-29 MED ORDER — APIXABAN 5 MG PO TABS
5.0000 mg | ORAL_TABLET | Freq: Two times a day (BID) | ORAL | Status: DC
  Administered 2013-06-29 – 2013-07-13 (×28): 5 mg via ORAL
  Filled 2013-06-29 (×30): qty 1

## 2013-06-29 MED ORDER — SERTRALINE HCL 100 MG PO TABS
100.0000 mg | ORAL_TABLET | Freq: Every day | ORAL | Status: DC
  Administered 2013-06-30 – 2013-07-13 (×14): 100 mg via ORAL
  Filled 2013-06-29 (×17): qty 1

## 2013-06-29 MED ORDER — VITAMIN B-1 100 MG PO TABS
100.0000 mg | ORAL_TABLET | Freq: Every day | ORAL | Status: DC
  Administered 2013-06-30 – 2013-07-13 (×14): 100 mg via ORAL
  Filled 2013-06-29 (×16): qty 1

## 2013-06-29 MED ORDER — FUROSEMIDE 20 MG PO TABS
20.0000 mg | ORAL_TABLET | Freq: Every day | ORAL | Status: DC
  Administered 2013-06-30 – 2013-07-04 (×5): 20 mg via ORAL
  Filled 2013-06-29 (×6): qty 1

## 2013-06-29 MED ORDER — METHOCARBAMOL 500 MG PO TABS
500.0000 mg | ORAL_TABLET | Freq: Four times a day (QID) | ORAL | Status: DC | PRN
  Administered 2013-07-12 – 2013-07-13 (×2): 500 mg via ORAL
  Filled 2013-06-29 (×2): qty 1

## 2013-06-29 MED ORDER — ENSURE COMPLETE PO LIQD: 237.0000 mL | Freq: Two times a day (BID) | ORAL | Status: AC

## 2013-06-29 MED ORDER — LEVALBUTEROL HCL 0.63 MG/3ML IN NEBU: 0.6300 mg | INHALATION_SOLUTION | Freq: Four times a day (QID) | RESPIRATORY_TRACT | Status: DC

## 2013-06-29 MED ORDER — BISACODYL 10 MG RE SUPP: 10.0000 mg | Freq: Every day | RECTAL | Status: DC | PRN

## 2013-06-29 MED ORDER — AMIODARONE HCL 400 MG PO TABS: 400.0000 mg | ORAL_TABLET | Freq: Two times a day (BID) | ORAL | Status: DC

## 2013-06-29 MED ORDER — POLYSACCHARIDE IRON COMPLEX 150 MG PO CAPS
150.0000 mg | ORAL_CAPSULE | Freq: Every day | ORAL | Status: DC
  Administered 2013-06-29 – 2013-07-13 (×15): 150 mg via ORAL
  Filled 2013-06-29 (×16): qty 1

## 2013-06-29 MED ORDER — PROCHLORPERAZINE EDISYLATE 5 MG/ML IJ SOLN
5.0000 mg | Freq: Four times a day (QID) | INTRAMUSCULAR | Status: DC | PRN
  Filled 2013-06-29: qty 2

## 2013-06-29 MED ORDER — AMIODARONE HCL 200 MG PO TABS
200.0000 mg | ORAL_TABLET | Freq: Two times a day (BID) | ORAL | Status: AC
  Administered 2013-07-02 – 2013-07-08 (×14): 200 mg via ORAL
  Filled 2013-06-29 (×15): qty 1

## 2013-06-29 MED ORDER — BUDESONIDE 0.25 MG/2ML IN SUSP
0.2500 mg | Freq: Two times a day (BID) | RESPIRATORY_TRACT | Status: DC
  Administered 2013-06-29 – 2013-07-12 (×23): 0.25 mg via RESPIRATORY_TRACT
  Filled 2013-06-29 (×31): qty 2

## 2013-06-29 MED ORDER — GUAIFENESIN-DM 100-10 MG/5ML PO SYRP
5.0000 mL | ORAL_SOLUTION | Freq: Four times a day (QID) | ORAL | Status: DC | PRN
  Administered 2013-06-29 – 2013-07-02 (×3): 10 mL via ORAL
  Filled 2013-06-29: qty 10
  Filled 2013-06-29: qty 5
  Filled 2013-06-29: qty 10
  Filled 2013-06-29: qty 5

## 2013-06-29 NOTE — Progress Notes (Signed)
ANTICOAGULATION CONSULT NOTE - Follow Up Consult  Pharmacy Consult for Heparin -> Xarelto ->[facility] Indication: atrial fibrillation  Allergies  Allergen Reactions  . Lisinopril Diarrhea and Nausea Only  . Paxil [Paroxetine] Diarrhea and Nausea Only  . Penicillins Hives   Patient Measurements: Height: 5\' 3"  (160 cm) Weight: 126 lb 8.7 oz (57.4 kg) IBW/kg (Calculated) : 52.4  Vital Signs: Temp: 97.8 F (36.6 C) (01/13 0442) Temp src: Oral (01/13 0442) BP: 119/69 mmHg (01/13 1111) Pulse Rate: 79 (01/13 1111)  Labs:  Recent Labs  06/27/13 0427 06/27/13 2210 06/28/13 0543 06/29/13 0600  HGB 10.0*  --  9.9* 9.6*  HCT 29.8*  --  28.6* 28.8*  PLT 441*  --  458* 459*  HEPARINUNFRC  --  <0.10* 0.17*  --   CREATININE 0.33*  --   --  0.39*   Estimated Creatinine Clearance: 58.8 ml/min (by C-G formula based on Cr of 0.39).  Assessment: Heparin drip increased this morning, but now to stop and begin Xarelto for non-valvular atrial fibrillation.  On amiodarone. Also on Aspirin 325 mg daily. POD#7 right IM nail.  Noted ongoing alcohol abuse. No LFTs this admit.  1/13 - Patient now prefers Lovenox.  Will begin dose this evening since she received a single dose of Xarelto last night.  Will use an adjusted weight on her and because this will be long term, will use ~ 1.5mg /kg/day to avoid multiple injections.  Goal of Therapy:  appropriate Xarelto dose for indication and renal function Monitor platelets by anticoagulation protocol: Yes   Plan:  1.  Lovenox 80 mg SQ daily - to start at Alpha 2.  CBC at least every 72 hours while hospitalized. 3.  Consider decreasing Aspirin to 81 mg daily with full anticoagulation on board?  Rober Minion, PharmD., MS Clinical Pharmacist Pager:  [phone] Thank you for allowing pharmacy to be part of this patients care team. 06/29/2013,1:14 PM

## 2013-06-29 NOTE — Discharge Summary (Signed)
Physician Discharge Summary  Stephanie Oliver WCB:[id number] DOB: December 01, 1950 DOA: 06/21/2013  PCP: [name], MD  Admit date: 06/21/2013 Discharge date: 06/29/2013  Time spent: 60  minutes  Recommendations for Outpatient Follow-up:  1. Follow up with Dr. Humphrey Rolls in 1 week for Non Small Cell Lung Cancer - New diagnosis 2. Patient had afib with RVR - she is followed by Dr. Terrence Dupont and will be on amiodarone and Lovenox for anticoagulation 3. Patient underwent surgical repair for right hip fracture. She needs surgical followup with Dr. Fredonia Highland 4. Patient with elevated LFTs likely due to alcohol use. Will need to follow LFTs on amiodarone 5. Patient with significant bruising on right thigh after hip fracture. Monitor CBC. Patient on Lovenox anticoagulation 6. Patient with slightly elevated TSH. Monitor outpatient 7.  CBC / CMET on 07/01/13  Discharge Diagnoses:  Principal Problem:   Intertrochanteric fracture of right hip Active Problems:   Hip fracture   PAF (paroxysmal atrial fibrillation)   Non-small cell cancer of right lung   Alcohol abuse, continuous   Hypertension   Mediastinal lymphadenopathy   COPD (chronic obstructive pulmonary disease)   Tobacco use disorder   Encephalopathy acute   Discharge Condition: stable, requiring oxygen Diet recommendation: Heart Healthy  Filed Weights   06/27/13 1220 06/28/13 0533 06/29/13 0500  Weight: 64.003 kg (141 lb 1.6 oz) 64 kg (141 lb 1.5 oz) 57.4 kg (126 lb 8.7 oz)    History of present illness:  Ms. Stephanie Oliver is a 63 yo female with a PMH of panic attacks, depression, hypertension, and alcohol abuse. She presented to the emergency department on January 5 with right hip fracture after a mechanical fall. Unfortunately during her hospitalization she experienced A. fib with RVR and was found to have non-small cell lung cancer.  Hospital Course:   Intertrochanteric fracture of the right hip  -Patient presented with a fall, and right  hip fracture.  -Status post, nail fixation done by Dr. Fredonia Highland.  -Continue pain control with narcotics, DVT prophylaxis will be Lovenox for 30 days. Patient refused Alen Blew -Will discharge to CIR for Rehab.   Atrial fibrillation with RVR  -Currently heart rate in the 80s, patient is on amiodarone.  -Patient was on 100 mg of Toprol-XL, this has been decreased to Lopressor 25 twice a day.  -Consulted cardiology, heparin was started. Amiodarone was increased to 400 twice a day.  -Per Dr. Terrence Dupont on 1/12: Reduce amiodarone to 200 twice daily on 07/01/13, and then 200 mg daily on 07/09/13.   -Patient will likely need to be on life long anticoagulation given Afib.  Will D/C on lovenox and defer to outpatient physician for on-going coagulation.  Shortness of breath  -Clinically patient had some fluid overload with bilateral crackles and +1 pedal edema.  -Chest x-ray showed developing bilateral effusion.  -2-D echo showed ejection fraction of about 58%.  -Lasix given 1/10, 1/11, chest x-ray showed less congestion.  -Likely multifactorial:  Anxiety, fluid build up and Lung cancer -Patient will need to remain on oxygen when she leaves the hospital. -Desats quickly to 85-87% when oxygen is removed.  NSCLC  -Admission chest x-ray showed right upper lobe paratracheal mass.  -FNA through the FOB showed non-small cell lung cancer.  -Dr. Humphrey Rolls, Onc was consulted. She indicates the patient is a good candidate for chemotherapy.  -Follow up with Dr. Humphrey Rolls post discharge for pet scan and treatment.  History of alcohol abuse  -Patient said she drinks about 4 glasses of whiskey  per day.  -has not shown any signs of withdraw in the hospital.  Anxiety / Panic attacks -continue Xanax bid PRN -Will add low dose long acting klonopin for now as patient is continually anxious.  Anemia -some degree of chronic anemia with post op anemia as well. -Stable for outpatient work up.  Hgb stable.  Does not need  transfusion at this time. -Patient currently with large bruising on right thigh and on Lovenox. -Monitor CBC.    Procedures:  Fiber optic bronchoscopy.  Surgical  Consultations:  Orh  Discharge Exam: Filed Vitals:   06/29/13 1331  BP: 93/56  Pulse: 74  Temp: 98.1 F (36.7 C)  Resp: 20   General: Alert and awake, oriented x3, not in any acute distress. Soft speech due to SOB.  HEENT: anicteric sclera, pupils reactive to light and accommodation, EOMI  CVS: S1-S2 clear, no murmur rubs or gallops  Chest: no w/c/r, appears slightly sob when speaking.  Abdomen: soft nontender, nondistended, normal bowel sounds, no organomegaly  Extremities: no cyanosis, clubbing or edema noted bilaterally  Neuro: Cranial nerves II-XII intact, no focal neurological deficits Skin:  Patient has a large bruise / hematoma on her left thigh - from top of hip to knee posteriorly.    Discharge Instructions      Discharge Orders   Future Orders Complete By Expires   Weight bearing as tolerated  As directed    Questions:     Laterality:     Extremity:         Medication List    STOP taking these medications       naproxen sodium 220 MG tablet  Commonly known as:  ANAPROX      TAKE these medications       acetaminophen 325 MG tablet  Commonly known as:  TYLENOL  Take 2 tablets (650 mg total) by mouth every 6 (six) hours as needed for mild pain (or Fever >/= 101).     ALPRAZolam 0.5 MG tablet  Commonly known as:  XANAX  Take 1 tablet (0.5 mg total) by mouth 2 (two) times daily as needed for anxiety.     amiodarone 400 MG tablet  Commonly known as:  PACERONE  Take 1 tablet (400 mg total) by mouth 2 (two) times daily. Until 07/01/13     amiodarone 200 MG tablet  Commonly known as:  PACERONE  Take 1 tablet (200 mg total) by mouth 2 (two) times daily. Until 07/09/13 then change to once daily.  Start taking on:  07/01/2013     amiodarone 200 MG tablet  Commonly known as:  PACERONE   Take 1 tablet (200 mg total) by mouth daily.  Start taking on:  07/09/2013     aspirin 325 MG tablet  Take 325 mg by mouth daily.     aspirin 325 MG EC tablet  Take 1 tablet (325 mg total) by mouth daily with breakfast.     budesonide 0.25 MG/2ML nebulizer solution  Commonly known as:  PULMICORT  Take 2 mLs (0.25 mg total) by nebulization 2 (two) times daily.     dextromethorphan-guaiFENesin 30-600 MG per 12 hr tablet  Commonly known as:  MUCINEX DM  Take 1 tablet by mouth 2 (two) times daily.     docusate sodium 100 MG capsule  Commonly known as:  COLACE  Take 1 capsule (100 mg total) by mouth 2 (two) times daily. Continue this while taking narcotics to help with bowel movements  enoxaparin 100 MG/ML injection  Commonly known as:  LOVENOX  Inject 0.85 mLs (85 mg total) into the skin daily.     feeding supplement (ENSURE COMPLETE) Liqd  Take 237 mLs by mouth 2 (two) times daily between meals.     HYDROcodone-acetaminophen 5-325 MG per tablet  Commonly known as:  NORCO  Take 2 tablets by mouth every 4 (four) hours as needed.     levalbuterol 0.63 MG/3ML nebulizer solution  Commonly known as:  XOPENEX  Take 3 mLs (0.63 mg total) by nebulization 4 (four) times daily.     metoprolol succinate 100 MG 24 hr tablet  Commonly known as:  TOPROL-XL  Take 100 mg by mouth daily.     metoprolol tartrate 25 MG tablet  Commonly known as:  LOPRESSOR  Take 1 tablet (25 mg total) by mouth 2 (two) times daily.     multivitamin with minerals Tabs tablet  Take 1 tablet by mouth daily.     sertraline 100 MG tablet  Commonly known as:  ZOLOFT  Take 100 mg by mouth daily.     VITAMIN D-3 PO  Take 1 tablet by mouth daily.       Allergies  Allergen Reactions  . Lisinopril Diarrhea and Nausea Only  . Paxil [Paroxetine] Diarrhea and Nausea Only  . Penicillins Hives   Follow-up Information   Follow up with MURPHY, TIMOTHY, D, MD. Schedule an appointment as soon as possible for a  visit in 2 weeks.   Specialty:  Orthopedic Surgery   Contact information:   [address] [place] [address] [phone]       Follow up with Marcy Panning, MD. Schedule an appointment as soon as possible for a visit in 1 week. (Oncology follow up for Non Small Cell Lung Cancer)    Specialty:  Oncology   Contact information:   [place] [address] [phone]        The results of significant diagnostics from this hospitalization (including imaging, microbiology, ancillary and laboratory) are listed below for reference.    Significant Diagnostic Studies: Dg Chest 2 View  06/27/2013   CLINICAL DATA:  Shortness of breath, labored breathing, lung mass  EXAM: CHEST  2 VIEW  COMPARISON:  06/26/2013, 06/21/2013  FINDINGS: Right upper lobe paratracheal mass again evident. Rotated exam to the left. Left PICC line tip extends into the proximal right atrium. Mild cardiac enlargement with central vascular congestion. Increased bibasilar densities, suspect atelectasis. Right hemidiaphragm is elevated. No pneumothorax. Atherosclerosis of the aorta.  IMPRESSION: Right upper lobe paratracheal mass, better demonstrated by CT comparison  Cardiomegaly with vascular congestion  Slight improvement in bibasilar atelectasis compared to yesterday.  Right hemidiaphragm elevation   Electronically Signed   By: Daryll Brod M.D.   On: 06/27/2013 12:19   Dg Hip Complete Right  06/21/2013   CLINICAL DATA:  Right hip pain secondary to a fall 2 days ago.  EXAM: RIGHT HIP - COMPLETE 2+ VIEW  COMPARISON:  None.  FINDINGS: There is an angulated comminuted slightly impacted intertrochanteric fracture of the proximal right femur. Lesser trochanter is displaced. 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  Pelvic bones are intact.  IMPRESSION: Comminuted intertrochanteric fracture of the proximal right femur.   Electronically Signed   By: Rozetta Nunnery M.D.   On:  06/21/2013 11:26   Dg Femur Right  06/22/2013   CLINICAL DATA:  Right femur fracture.  EXAM: RIGHT  FEMUR - 2 VIEW; DG C-ARM 1-60 MIN  COMPARISON:  Radiographs dated 06/21/2013  FINDINGS: C-arm images demonstrate insertion of intra medullary nail and compression screw across the intertrochanteric fracture of the proximal right femur. Alignment and position of the fracture fragments is near anatomic. Hardware appears in good position.  IMPRESSION: Open reduction and internal fixation of intertrochanteric fracture of the proximal right femur.   Electronically Signed   By: Rozetta Nunnery M.D.   On: 06/22/2013 13:08   Ct Head Wo Contrast  06/21/2013   CLINICAL DATA:  History of trauma from a fall. Right leg pain and weakness.  EXAM: CT HEAD WITHOUT CONTRAST  TECHNIQUE: Contiguous axial images were obtained from the base of the skull through the vertex without intravenous contrast.  COMPARISON:  No priors.  FINDINGS: Patchy and confluent areas of decreased attenuation are noted throughout the deep and periventricular white matter of the cerebral hemispheres bilaterally, compatible with chronic microvascular ischemic disease. No acute displaced skull fractures are identified. No acute intracranial abnormality. Specifically, no evidence of acute post-traumatic intracranial hemorrhage, no definite regions of acute/subacute cerebral ischemia, no focal mass, mass effect, hydrocephalus or abnormal intra or extra-axial fluid collections. The visualized paranasal sinuses and mastoids are well pneumatized.  IMPRESSION: 1. No acute displaced skull fracture or findings to suggest acute intracranial trauma. 2. Chronic microvascular ischemic changes in the cerebral white matter, as above.   Electronically Signed   By: Vinnie Langton M.D.   On: 06/21/2013 12:11   Ct Head W Wo Contrast  06/23/2013   CLINICAL DATA:  Encephalopathy. Lung mass. Rule out metastatic disease.  EXAM: CT HEAD WITHOUT AND WITH CONTRAST  TECHNIQUE:  Contiguous axial images were obtained from the base of the skull through the vertex without and with intravenous contrast  CONTRAST:  146mL OMNIPAQUE IOHEXOL 300 MG/ML  SOLN  COMPARISON:  CT head 06/21/2013  FINDINGS: Mild atrophy. Negative for hydrocephalus. Patchy hypodensity throughout the cerebral white matter bilaterally does not enhance and is most consistent with chronic microvascular ischemia.  Negative for acute infarct.  Negative for hemorrhage.  Postcontrast imaging reveals no enhancing lesions. No evidence of metastatic disease.  Calvarium is negative  IMPRESSION: Atrophy and chronic microvascular ischemic change in the white matter.  Negative for metastatic disease.  No acute abnormality.   Electronically Signed   By: Franchot Gallo M.D.   On: 06/23/2013 10:57   Ct Chest W Contrast  06/21/2013   CLINICAL DATA:  Paratracheal mass  EXAM: CT CHEST WITH CONTRAST  TECHNIQUE: Multidetector CT imaging of the chest was performed during intravenous contrast administration.  CONTRAST:  One daughter cc of Omnipaque 300  COMPARISON:  Prior radiograph performed earlier on the same date.  FINDINGS: The visualized thyroid is within normal limits.  Aorta and great vessels are of normal caliber. Prominent atherosclerotic calcifications noted at the origin of the great vessels.  Heart size is within normal limits.  No pericardial effusion.  A large heterogeneous mass measuring 7.2 x 5.0 x 6.4 cm is seen within the medial aspect of the right upper lobe (series 2, image 20). Central hypodensity seen within this lesion is most consistent with necrosis. The mass abuts and invades the anterior mediastinum medially, and anterior medial chest wall anteriorly. There is secondary mass effect on knee adjacent SVC. Right upper lobe segmental pulmonary arteries are attenuated at the level of the mass. There is associated atelectasis within the right upper lobe Finding is highly concerning for bronchogenic carcinoma.  An  adjacent  satellite nodule measuring 6 mm is seen at the lateral aspect of the primary mass lesion (series 3, image 20). Additional subpleural nodules measuring up to 7 mm are seen more peripherally within the right upper lobe (series 3, image 23, 24). Additional dense 7 mm nodule seen more superiorly near the right lung apex (series 3, image 11).  No focal infiltrate or pleural effusion.  A large necrotic right hilar lymph node measuring 4.0 x 4.6 x 4.3 cm is present (series 2, image 27). This node exerts mass effect on the right main pulmonary posteriorly which is slightly narrowed. The root right pulmonary artery and its branches are opacified distally. A 2nd 1 cm necrotic precarinal node is also present (series 2, image 22).  Diffuse hypoattenuation is seen within liver, suggestive of steatosis. Partially visualized upper abdomen is otherwise unremarkable.  Remote left 8th rib fracture is noted (series 2, image 36). No acute osseous abnormality. No focal lytic or blastic osseous lesions.  IMPRESSION: 1. 7.2 x 5.0 x 6.4 cm necrotic right upper lobe mass, most consistent with primary lung malignancy. The mass appears to be locally invasive into the anterior mediastinum medially. 2. Enlarged necrotic right hilar lymph node with smaller precarinal node as above, worrisome for nodal metastasis. 3. Additional subcentimeter pulmonary nodules within the right upper lobe as above, worrisome for malignancy as well. 4. The subacute healing fracture of the left posterolateral 8th rib. 5. Hepatic steatosis.   Electronically Signed   By: Jeannine Boga M.D.   On: 06/21/2013 16:40   Dg Chest Port 1 View  06/26/2013   CLINICAL DATA:  Medial right upper lobe mass, labored breathing  EXAM: PORTABLE CHEST - 1 VIEW  COMPARISON:  06/21/2013  FINDINGS: The medial right upper lobe paratracheal mass is again evident. Right hemidiaphragm is elevated. Heart is enlarged. Vascular congestion evident centrally with increased basilar  atelectasis versus airspace disease. Both hemidiaphragms are partially obscured. Small effusions not excluded. No pneumothorax.  IMPRESSION: Persistent medial right upper lobe paratracheal mass.  Cardiomegaly with vascular congestion and developing basilar atelectasis versus airspace disease.  Developing small pleural effusions   Electronically Signed   By: Daryll Brod M.D.   On: 06/26/2013 08:52   Dg Chest Port 1 View  06/21/2013   CLINICAL DATA:  Preop for hip fracture.  Slight cough.  EXAM: PORTABLE CHEST - 1 VIEW  COMPARISON:  None.  FINDINGS: The heart size is normal. A right peritracheal mass measures 5.3 x 7.0 cm. Other smaller right-sided nodules are evident. A 2 cm left pulmonary nodule is suggested. There is no edema or effusion to suggest failure. The right hemidiaphragm is elevated.  No acute focal airspace disease is evident.  IMPRESSION: 1. 7 cm right peritracheal mass. Recommend CT of the chest with contrast for further evaluation. 2. Additional smaller nodules are suggested, including a 2 cm left lower lobe nodule.   Electronically Signed   By: Lawrence Santiago M.D.   On: 06/21/2013 15:10   Dg C-arm 1-60 Min  06/22/2013   CLINICAL DATA:  Right femur fracture.  EXAM: RIGHT FEMUR - 2 VIEW; DG C-ARM 1-60 MIN  COMPARISON:  Radiographs dated 06/21/2013  FINDINGS: C-arm images demonstrate insertion of intra medullary nail and compression screw across the intertrochanteric fracture of the proximal right femur. Alignment and position of the fracture fragments is near anatomic. Hardware appears in good position.  IMPRESSION: Open reduction and internal fixation of intertrochanteric fracture of the proximal right femur.  Electronically Signed   By: Rozetta Nunnery M.D.   On: 06/22/2013 13:08    Microbiology: Recent Results (from the past 240 hour(s))  SURGICAL PCR SCREEN     Status: Abnormal   Collection Time    06/21/13 10:04 PM      Result Value Range Status   MRSA, PCR NEGATIVE  NEGATIVE Final    Staphylococcus aureus POSITIVE (*) NEGATIVE Final   Comment:            The Xpert SA Assay (FDA     approved for NASAL specimens     in patients over 41 years of age),     is one component of     a comprehensive surveillance     program.  Test performance has     been validated by [employer] for patients greater     than or equal to 34 year old.     It is not intended     to diagnose infection nor to     guide or monitor treatment.     RESULT CALLED TO, READ BACK BY AND VERIFIED WITH:     T. COOK (RN) [phone] 06/22/2013 [facility]     Labs: Basic Metabolic Panel:  Recent Labs Lab 06/23/13 0335 06/24/13 0309 06/25/13 0525 06/26/13 0500 06/27/13 0427 06/29/13 0600  NA 137 138 134* 135* 138 134*  K 3.9 3.6* 3.8 3.8 4.0 3.4*  CL 97 97 93* 93* 91* 89*  CO2 31 30 31  32 38* 35*  GLUCOSE 101* 120* 119* 113* 98 135*  BUN 14 9 9 14 15 11   CREATININE 0.36* 0.34* 0.35* 0.34* 0.33* 0.39*  CALCIUM 8.3* 8.3* 8.5 8.7 8.6 8.3*  MG  --  1.4* 1.3* 1.4* 2.1  --   PHOS  --  2.1* 2.6 3.1 3.4  --    Liver Function Tests:  Recent Labs Lab 06/29/13 0600  AST 41*  ALT 51*  ALKPHOS 156*  BILITOT 1.4*  PROT 5.8*  ALBUMIN 2.3*   CBC:  Recent Labs Lab 06/24/13 0309 06/25/13 0525 06/26/13 0500 06/27/13 0427 06/28/13 0543 06/29/13 0600  WBC 10.9* 15.5* 13.0* 15.4* 12.7* 13.6*  NEUTROABS 7.5 11.6* 10.1* 11.7*  --   --   HGB 8.7* 9.3* 8.8* 10.0* 9.9* 9.6*  HCT 26.4* 27.7* 25.7* 29.8* 28.6* 28.8*  MCV 101.9* 101.5* 98.8 100.0 97.9 98.6  PLT 264 372 348 441* 458* 459*   Cardiac Enzymes:  Recent Labs Lab 06/23/13 0830 06/23/13 1544 06/24/13 0125  TROPONINI <0.30 <0.30 <0.30   BNP: BNP (last 3 results)  Recent Labs  06/23/13 0830 06/24/13 0309  PROBNP 680.4* 995.0*     Signed:  Murriel Kitchens [phone]  [facility] 06/29/2013, 1:54 PM

## 2013-06-29 NOTE — Progress Notes (Signed)
received pt. As a transfer,pt. Alert and oriented,pt. Has some redness on bottom area and bruises on upper and lower extremities.Also three incisions on Rt. Leg.Keep monitoring pt. And assessing her needs.

## 2013-06-29 NOTE — Discharge Summary (Signed)
Addendum  Patient seen and examined, chart and data base reviewed.  I agree with the above assessment and plan.  For full details please see Mrs. Imogene Burn PA note.   Birdie Hopes, MD [facility] Pager: [phone] 06/29/2013, 6:11 PM

## 2013-06-29 NOTE — Progress Notes (Signed)
Pt. Was transferred down to rehab. Ecchymosis noted to R leg area. Steri strips to upper hip intact, dry, covered with a new foam dsg. The four lower steri strips to the lower R leg were removed. Report was called into [place] [facility].

## 2013-06-29 NOTE — Progress Notes (Signed)
Physical Therapy Treatment Patient Details Name: Stephanie Oliver MRN: [id number] DOB: [birth year] Today's Date: 06/29/2013 Time: 9935-7017 PT Time Calculation (min): 27 min  PT Assessment / Plan / Recommendation  History of Present Illness 63 yo active smoker with reported weight loss since 2 years ago admitted for hip fracture sustained after a fall at home and underwent Rt hip gamma nail fixation 06/22/13 and is WBAT.  Chest x-ray showeed large rite sided lung mass and she underwent a biopsy during surgery 06/22/13.  Initial pathology report consistent with NSCLC. Pt with h/o ETOH abuse   PT Comments   Pt making steady progress.  Follow Up Recommendations  CIR     Does the patient have the potential to tolerate intense rehabilitation     Barriers to Discharge        Equipment Recommendations  Rolling walker with 5" wheels    Recommendations for Other Services    Frequency Min 3X/week   Progress towards PT Goals Progress towards PT goals: Progressing toward goals  Plan Current plan remains appropriate    Precautions / Restrictions Precautions Precautions: Fall Restrictions RLE Weight Bearing: Weight bearing as tolerated   Pertinent Vitals/Pain See flow sheet.    Mobility  Bed Mobility Supine to sit: Mod assist;+2 for physical assistance;HOB elevated General bed mobility comments: Assist to bring trunk up and to move RLE. Transfers Overall transfer level: Needs assistance Equipment used: Rolling walker (2 wheeled);None Charlaine Dalton) Transfers: Sit to/from [employer] to Stand: Mod assist;+2 physical assistance Stand pivot transfers: Mod assist;+2 physical assistance General transfer comment: Assist to bring hips up into standing.  Able to achieve full standing with Stedy and with walker with manual facilitation at hips. Used Stedy for pivot to chair. Ambulation/Gait Ambulation/Gait assistance: +2 physical assistance;Mod assist Ambulation Distance (Feet): 5  Feet Assistive device: Rolling walker (2 wheeled) Gait Pattern/deviations: Step-to pattern;Decreased step length - left;Decreased stance time - right;Antalgic General Gait Details: Pt with rt hip and knee flexing with wt bearing.  Manual facilitation at rt hip to promote extension and wt bearing on rt leg.    Exercises General Exercises - Lower Extremity Long Arc Quad: AAROM;Both;10 reps;Seated   PT Diagnosis:    PT Problem List:   PT Treatment Interventions:     PT Goals (current goals can now be found in the care plan section)    Visit Information  Last PT Received On: 06/29/13 Assistance Needed: +2 History of Present Illness: 63 yo active smoker with reported weight loss since 2 years ago admitted for hip fracture sustained after a fall at home and underwent Rt hip gamma nail fixation 06/22/13 and is WBAT.  Chest x-ray showeed large rite sided lung mass and she underwent a biopsy during surgery 06/22/13.  Initial pathology report consistent with NSCLC. Pt with h/o ETOH abuse    Subjective Data      Cognition  Cognition Arousal/Alertness: Awake/alert Behavior During Therapy: WFL for tasks assessed/performed Overall Cognitive Status: Within Functional Limits for tasks assessed    Balance  Balance Sitting-balance support: Bilateral upper extremity supported Sitting balance-Leahy Scale: Fair Standing balance support: Bilateral upper extremity supported Standing balance-Leahy Scale: Poor  End of Session PT - End of Session Equipment Utilized During Treatment: Gait belt;Oxygen Activity Tolerance: Patient tolerated treatment well Patient left: in chair;with call bell/phone within reach;with chair alarm set Nurse Communication: Mobility status;Need for lift equipment   GP     Bishop Vanderwerf 06/29/2013, 11:55 AM  [facility] PT [phone]

## 2013-06-29 NOTE — Progress Notes (Signed)
Rehab admissions - I met with pt and shared that her [employer] does not require authorization. Pt would like to come to inpatient rehab and bed is available today. Daughter is in agreement. Will plan for admit today to CIR.  Thank you, Nanetta Batty, PT Rehabilitation Admissions Coordinator [phone]

## 2013-06-29 NOTE — H&P (Signed)
Physical Medicine and Rehabilitation Admission H&P  Chief Complaint   Patient presents with   .  Right hip Fracture s/p IM nailing complicated by A fib and fluid overload.   HPI: Stephanie Oliver is a 63 y.o. female with history of alcohol abuse, depression with anxiety, smoker who fell on 06/18/13 with onset of pain and difficulty wight bearing on RLE and inability to walk. She was admitted on 06/21/13 for work up. sje was found to have right comminuted IT hip fracture as well as 7.2 x 5.0 x 6.4 cm necrotic right upper lobe mass (most consistent with primary lung malignancy) locally invasive to anterior mediastinum as well as enlarged right hilar node with smaller pericarnal nodes and additional sub-centimeter RUL pulmonary nodules. She underwent right hip IM nail fixation by Dr. Percell Miller as well as bronchoscopy with tracheobronchial needle aspiration of R-paratracheal mass. Bone biopsy of right hip negative for cancer. Cytology positive for non-small cell carcinoma. Dr. Humphrey Rolls consulted for input and recommends full workup and chemotherapy--patient not a surgical candidate. Final decision to be made after recovery and d/c to home.  Post op A fib with RVR treated with amiodarone and lopressor with conversion to NSR. Patient has had high levels of anxiety as well as SOB due to fluid overload. She developed recurrent A fib with RVR and Dr. Terrence Dupont was consulted for input. Amiodarone increased to 400 mg bid and patient started on IV heparin. Amiodarone taper recommended as now in NSR. Therapies ongoing and patient has with activities due to anxiety and pain--showing improvement in ability to weight bear thorough RLE today. Has refused SNF but willing to try CIR x 1 week.    Review of Systems  Eyes: Negative for blurred vision and double vision.  Respiratory: Positive for cough and shortness of breath.  Cardiovascular: Negative for chest pain and palpitations.  Gastrointestinal: Negative for heartburn, abdominal  pain and constipation.  Genitourinary: Positive for urgency and frequency (due to lasix--refusing).  Neurological: Positive for weakness. Negative for headaches.  Psychiatric/Behavioral: The patient is nervous/anxious and has insomnia.   Past Medical History   Diagnosis  Date   .  Depression    .  Panic attacks    .  Hypertension    .  Smoker    .  Alcohol abuse     History reviewed. No pertinent past surgical history.  Family History   Problem  Relation  Age of Onset   .  CAD  Father    .  Arthritis  Mother     Social History: Lives with family--helps take care of grandson. She reports that she has been smoking Cigarettes. She has been smoking about 1.50 packs per day. She does not have any smokeless tobacco history on file. She reports that she drinks alcohol--four mixed drinks daily. She reports that she does not use illicit drugs.  Allergies   Allergen  Reactions   .  Lisinopril  Diarrhea and Nausea Only   .  Paxil [Paroxetine]  Diarrhea and Nausea Only   .  Penicillins  Hives    Medications Prior to Admission   Medication  Sig  Dispense  Refill   .  ALPRAZolam (XANAX) 0.5 MG tablet  Take 0.5 mg by mouth 2 (two) times daily as needed for anxiety.     Marland Kitchen  aspirin 325 MG tablet  Take 325 mg by mouth daily.     .  Cholecalciferol (VITAMIN D-3 PO)  Take 1 tablet by mouth daily.     Marland Kitchen  metoprolol succinate (TOPROL-XL) 100 MG 24 hr tablet  Take 100 mg by mouth daily.     .  Multiple Vitamin (MULTIVITAMIN WITH MINERALS) TABS tablet  Take 1 tablet by mouth daily.     .  naproxen sodium (ANAPROX) 220 MG tablet  Take 220 mg by mouth 2 (two) times daily with a meal.     .  sertraline (ZOLOFT) 100 MG tablet  Take 100 mg by mouth daily.      Home:  Home Living    Living Arrangements: Children  Additional Comments: Pt lived with dtr and infant grandson in a split [place] home. 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  Functional Status:  Mobility:  Bed Mobility  Bed Mobility: Supine to Sit;Sitting - Scoot to Edge of Bed;Sit to Supine  Supine to Sit: 3: Mod assist;HOB elevated  Sitting - Scoot to Edge of Bed: 4: Min assist  Sit to Supine: 3: Mod assist;HOB flat  Transfers  Sit to Stand: 3: Mod assist;2: Max assist;From bed  Stand to Sit: 3: Mod assist;With upper extremity assist;To bed    ADL:  ADL  Eating/Feeding: Independent  Where Assessed - Eating/Feeding: Bed level;Chair  Grooming: Wash/dry hands;Wash/dry face;Teeth care;Set up  Where Assessed - Grooming: Supported sitting  Upper Body Bathing: Set up  Where Assessed - Upper Body Bathing: Supported sitting  Lower Body Bathing: Maximal assistance  Where Assessed - Lower Body Bathing: Supported sit to stand  Upper Body Dressing: Minimal assistance  Where Assessed - Upper Body Dressing: Supported sitting  Lower Body Dressing: +1 Total assistance  Where Assessed - Lower Body Dressing: Supported sit to Retail buyer: Maximal assistance  Toilet Transfer Method: Sit to Surveyor, quantity: Bedside commode  Equipment Used: Rolling walker  Transfers/Ambulation Related to ADLs: mod - max A sit to stand  ADL Comments: Pt unable to access feet for LB ADLs. Pt fatigues quickly  Cognition:  Cognition  Overall Cognitive Status: Within Functional Limits for tasks assessed  Orientation Level: Oriented to person;Oriented to place;Disoriented to time  Cognition  Arousal/Alertness: Awake/alert  Behavior During Therapy: Anxious  Overall Cognitive Status: Within Functional Limits for tasks assessed    Physical Exam:  Blood pressure 119/69, pulse 79, temperature 97.8 F (36.6 C), temperature source Oral, resp. rate 20, height 5' 3"  (1.6 m), weight 57.4 kg (126 lb 8.7 oz), SpO2 98.00%.  Constitutional: She is oriented to person, place, and time.  Pale, ill appearing  female--sedated appearing.  HENT: oral mucosa pink and moist Head: Normocephalic and atraumatic.  Eyes: Conjunctivae are normal. Pupils are equal, round, and reactive to light.  Neck: Normal range of motion. Neck supple.  Cardiovascular: Normal rate and regular rhythm.  No murmur heard.  Respiratory: Breath sounds normal. No respiratory distress. She has no wheezes.  Occasional cough. Less dyspnea today GI: Soft. Bowel sounds are normal. She exhibits no distension. There is no tenderness.  Musculoskeletal: She exhibits edema.  Right hip with edema and resolving ecchymosis--steri strips in place.  Neurological: She is oriented to person, place, and time.  Alert and appropriate. Awareness and insight improved c/w last evaluation. 4/5 bilateral deltoid, bicep, tricep grip. 1 to 2-/5 right hip flexor, 2 to 2+/5 knee extensor knee flexor and 3+ to 4/5 ankle dorsiflexor plantar flexor 3   left hip flexor, 4/5 knee  extensor, 5/5 ankle dorsiflexion/ plantar flexor  Skin: Skin is warm and dry.  Psych: a little flat but generally appropriate.     Results for orders placed during the hospital encounter of 06/21/13 (from the past 48 hour(s))   HEPARIN LEVEL (UNFRACTIONATED) Status: Abnormal    Collection Time    06/27/13 10:10 PM   Result  Value  Range    Heparin Unfractionated  <0.10 (*)  0.30 - 0.70 IU/mL    Comment:      IF HEPARIN RESULTS ARE BELOW     EXPECTED VALUES, AND PATIENT     DOSAGE HAS BEEN CONFIRMED,     SUGGEST FOLLOW UP TESTING     OF ANTITHROMBIN III LEVELS.   CBC Status: Abnormal    Collection Time    06/28/13 5:43 AM   Result  Value  Range    WBC  12.7 (*)  4.0 - 10.5 K/uL    RBC  2.92 (*)  3.87 - 5.11 MIL/uL    Hemoglobin  9.9 (*)  12.0 - 15.0 g/dL    HCT  28.6 (*)  36.0 - 46.0 %    MCV  97.9  78.0 - 100.0 fL    MCH  33.9  26.0 - 34.0 pg    MCHC  34.6  30.0 - 36.0 g/dL    RDW  14.3  11.5 - 15.5 %    Platelets  458 (*)  150 - 400 K/uL   HEPARIN LEVEL  (UNFRACTIONATED) Status: Abnormal    Collection Time    06/28/13 5:43 AM   Result  Value  Range    Heparin Unfractionated  0.17 (*)  0.30 - 0.70 IU/mL    Comment:      IF HEPARIN RESULTS ARE BELOW     EXPECTED VALUES, AND PATIENT     DOSAGE HAS BEEN CONFIRMED,     SUGGEST FOLLOW UP TESTING     OF ANTITHROMBIN III LEVELS.   TSH Status: Abnormal    Collection Time    06/28/13 5:43 AM   Result  Value  Range    TSH  5.444 (*)  0.350 - 4.500 uIU/mL    Comment:  Performed at [facility]   CBC Status: Abnormal    Collection Time    06/29/13 6:00 AM   Result  Value  Range    WBC  13.6 (*)  4.0 - 10.5 K/uL    RBC  2.92 (*)  3.87 - 5.11 MIL/uL    Hemoglobin  9.6 (*)  12.0 - 15.0 g/dL    HCT  28.8 (*)  36.0 - 46.0 %    MCV  98.6  78.0 - 100.0 fL    MCH  32.9  26.0 - 34.0 pg    MCHC  33.3  30.0 - 36.0 g/dL    RDW  13.9  11.5 - 15.5 %    Platelets  459 (*)  150 - 400 K/uL   COMPREHENSIVE METABOLIC PANEL Status: Abnormal    Collection Time    06/29/13 6:00 AM   Result  Value  Range    Sodium  134 (*)  137 - 147 mEq/L    Potassium  3.4 (*)  3.7 - 5.3 mEq/L    Chloride  89 (*)  96 - 112 mEq/L    CO2  35 (*)  19 - 32 mEq/L    Glucose, Bld  135 (*)  70 - 99 mg/dL    BUN  11  6 - 23 mg/dL    Creatinine, Ser  0.39 (*)  0.50 - 1.10 mg/dL    Calcium  8.3 (*)  8.4 - 10.5 mg/dL    Total Protein  5.8 (*)  6.0 - 8.3 g/dL    Albumin  2.3 (*)  3.5 - 5.2 g/dL    AST  41 (*)  0 - 37 U/L    ALT  51 (*)  0 - 35 U/L    Alkaline Phosphatase  156 (*)  39 - 117 U/L    Total Bilirubin  1.4 (*)  0.3 - 1.2 mg/dL    GFR calc non Af Amer  >90  >90 mL/min    GFR calc Af Amer  >90  >90 mL/min    Comment:  (NOTE)     The eGFR has been calculated using the CKD EPI equation.     This calculation has not been validated in all clinical situations.     eGFR's persistently <90 mL/min signify possible Chronic Kidney     Disease.    No results found.    Post Admission Physician Evaluation:   1. Functional deficits secondary to comminuted right IT hip fx s/p IMN. Recently diagnosed with [place] lung ca. 2. Patient is admitted to receive collaborative, interdisciplinary care between the physiatrist, rehab nursing staff, and therapy team. 3. Patient's level of medical complexity and substantial therapy needs in context of that medical necessity cannot be provided at a lesser intensity of care such as a SNF. 4. Patient has experienced substantial functional loss from his/her baseline which was documented above under the "Functional History" and "Functional Status" headings. Judging by the patient's diagnosis, physical exam, and functional history, the patient has potential for functional progress which will result in measurable gains while on inpatient rehab. These gains will be of substantial and practical use upon discharge in facilitating mobility and self-care at the household level. 5. Physiatrist will provide 24 hour management of medical needs as well as oversight of the therapy plan/treatment and provide guidance as appropriate regarding the interaction of the two. 6. 24 hour rehab nursing will assist with bladder management, bowel management, safety, skin/wound care, disease management, medication administration, pain management and patient education and help integrate therapy concepts, techniques,education, etc. 7. PT will assess and treat for/with: Lower extremity strength, range of motion, stamina, balance, functional mobility, safety, adaptive techniques and equipment, pain mgt, ortho precautions, education. Goals are: supervision. 8. OT will assess and treat for/with: ADL's, functional mobility, safety, upper extremity strength, adaptive techniques and equipment, pain mgt, ortho precautions, education . Goals are: mod I to supervision. 9. SLP will assess and treat for/with: n/a. Goals are: n/a. 10. Case Management and Social Worker will assess and treat for psychological issues and  discharge planning. 11. Team conference will be held weekly to assess progress toward goals and to determine barriers to discharge. 12. Patient will receive at least 3 hours of therapy per day at least 5 days per week. 13. ELOS: 11-16 days  14. Prognosis: excellent Medical Problem List and Plan:  1. DVT Prophylaxis/Anticoagulation: Discussed need of anticoagulation with Dr. Alverda Skeans v/s benefits of blood thinners. He recommends changing patient to eliquis (due to ETOH abuse hx) and to follow up in office for decision regarding need for long term anticoagulation.  2. Pain Management: Continue prn hydrocodone for now. Add tramadol additionally as needed.  3. H/o panic attacks/depression/Mood: Continue zoloft. Decrease ativan to 0.5 mg prn. LCSW to follow for evaluation.  4. Neuropsych: This patient is capable of making decisions on her own behalf.  5. Fluid overload: Still sounds wet--refused lasix today due to frequency. Check daily weights. Add low salt restrictions. Encourage medication compliance. Wean oxygen as able.   -follow daily I's and O's as well as weights 6. A fib with RVR: currently in NSR on amiodarone and lopressor. To decrease amiodarone to 200 mg bid starting 07/01/13 then 200 mg daily past one week. Start eliquis.  7. Non small-cell Lung Cancer: Discussion regarding treatment options past recovery per reports. To follow up with Dr. Humphrey Rolls past discharge.  8. Reactive Leucocytosis: Will recheck labs in am.  9. ABLA: Will add iron supplement  10. Hypokalemia: Due to diuretics. Will add daily supplement.  11. Alcohol abuse: No withdrawal signs reported.   Meredith Staggers, MD, [facility]   06/29/2013

## 2013-06-29 NOTE — Discharge Instructions (Signed)
Regarding Hip Surgery:  Bear weight as tolerated on Right leg: Change dressings every 3 days

## 2013-06-29 NOTE — Progress Notes (Signed)
Rehab admissions - I met with patient.  She would like to come to inpatient rehab.  I have called [employer] and opened the case.  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  I expect to get a denial for acute inpatient rehab, but I will await insurance decision.  Recommend pursuing SNF as a back up.  I did speak with patient's daughter last pm and she too was in agreement to inpatient rehab admission.  Call me for questions.  #[phone]

## 2013-06-29 NOTE — Clinical Social Work Note (Signed)
Patient will be admitted to CIR. CSW signing off at this time.  Liz Beach, [place], [place], [phone]

## 2013-06-29 NOTE — Progress Notes (Signed)
Physical Therapy Treatment Patient Details Name: Stephanie Oliver MRN: [id number] DOB: 1951-03-24 Today's Date: 06/29/2013 Time: 1025-8527 PT Time Calculation (min): 19 min  PT Assessment / Plan / Recommendation  History of Present Illness 63 yo active smoker with reported weight loss since 2 years ago admitted for hip fracture sustained after a fall at home and underwent Rt hip gamma nail fixation 06/22/13 and is WBAT.  Chest x-ray showeed large rite sided lung mass and she underwent a biopsy during surgery 06/22/13.  Initial pathology report consistent with NSCLC. Pt with h/o ETOH abuse   PT Comments   Pt making steady progress. Continue to recommend comprehensive inpatient rehab (CIR) for post-acute therapy needs.   Follow Up Recommendations  CIR     Does the patient have the potential to tolerate intense rehabilitation     Barriers to Discharge        Equipment Recommendations  Rolling walker with 5" wheels    Recommendations for Other Services    Frequency Min 3X/week   Progress towards PT Goals Progress towards PT goals: Progressing toward goals  Plan Current plan remains appropriate    Precautions / Restrictions Precautions Precautions: Fall Restrictions RLE Weight Bearing: Weight bearing as tolerated   Pertinent Vitals/Pain Rt hip pain with mobility.  Repositioned.    Mobility  Bed Mobility Bed Mobility: Sit to Supine Sit to supine: Mod assist;+2 for physical assistance General bed mobility comments: Assist to lower trunk and bring legs up. Transfers Overall transfer level: Needs assistance Equipment used:  Charlaine Dalton) Transfers: Sit to/from [employer] to Stand: Mod assist;+2 physical assistance Stand pivot transfers: Mod assist;+2 physical assistance General transfer comment: Assist to bring hips up into standing.  Able to achieve full standing with Stedy and with walker with manual facilitation at hips. Used Stedy for pivot to [facility] and then pivot to  bed.   Exercises     PT Diagnosis:    PT Problem List:   PT Treatment Interventions:     PT Goals (current goals can now be found in the Oliver plan section)    Visit Information  Last PT Received On: 06/29/13 Assistance Needed: +2 History of Present Illness: 63 yo active smoker with reported weight loss since 2 years ago admitted for hip fracture sustained after a fall at home and underwent Rt hip gamma nail fixation 06/22/13 and is WBAT.  Chest x-ray showeed large rite sided lung mass and she underwent a biopsy during surgery 06/22/13.  Initial pathology report consistent with NSCLC. Pt with h/o ETOH abuse    Subjective Data      Cognition  Cognition Arousal/Alertness: Awake/alert Behavior During Therapy: WFL for tasks assessed/performed Overall Cognitive Status: Within Functional Limits for tasks assessed    Balance  Balance Sitting-balance support: Bilateral upper extremity supported Sitting balance-Leahy Scale: Fair Standing balance support: Bilateral upper extremity supported Standing balance-Leahy Scale: Poor  End of Session PT - End of Session Equipment Utilized During Treatment: Gait belt;Oxygen Activity Tolerance: Patient tolerated treatment well Patient left: in bed;with call bell/phone within reach Nurse Communication: Mobility status;Need for lift equipment   GP     Rony Ratz 06/29/2013, 3:37 PM  [place]

## 2013-06-29 NOTE — PMR Pre-admission (Signed)
PMR Admission Coordinator Pre-Admission Assessment  Patient: Stephanie Oliver is an 63 y.o., female MRN: [id number] DOB: Sep 30, 1950 Height: 5' 3"  (160 cm) Weight: 57.4 kg (126 lb 8.7 oz)              Insurance Information HMO:     PPO:      PCP:      IPA:      80/20:      OTHER: short term plan PRIMARY: [place]      Policy#: [id number]      Subscriber: self CM Name:       Phone#:      Fax#:  Pre-Cert#: pre-authorization not required      Employer:  Benefits:  Phone #: [phone]     Name: Juliane Lack. Date: 03-02-13     Deduct: $5000 ($170 met)      Out of Pocket Max: $10,000 ($170 met)         Life Max: 1.5 million CIR: 80/20%      SNF: 80/20% (60 days/year) Outpatient: 80%     Co-Pay: 20% ($50 copay based on med. Necessity) Home Health: 80%      Co-Pay: 20% (7 visits/week, 365/lifetime)  DME: 80%     Co-Pay: 20% Providers: in network Note: Policy terms August 29, 2013 unless pt signs up again.  Emergency Contact Information Contact Information   Name Relation Home Work [place] Friend [place] Daughter [phone]  [phone]     Current Medical History  Patient Admitting Diagnosis: Right intertrochanteric fracture of the hip  History of Present Illness: Stephanie Oliver is a 63 y.o. female with history of alcohol abuse, depression with anxiety, smoker who fell on 06/18/13 with onset of pain and difficulty wight bearing on RLE and inability to walk. She was admitted on 06/21/13 for work up. sje was found to have right comminuted IT hip fracture as well as 7.2 x 5.0 x 6.4 cm necrotic right upper lobe mass (most consistent with primary lung malignancy) locally invasive to anterior mediastinum as well as enlarged right hilar node with smaller pericarnal nodes and additional sub-centimeter RUL pulmonary nodules. She underwent right hip IM nail fixation by Dr. Percell Miller as well as bronchoscopy with tracheobronchial needle aspiration of R-paratracheal mass. Bone  biopsy of right hip negative for cancer. Cytology positive for non-small cell carcinoma. Dr. Humphrey Rolls consulted for input and recommends full workup and chemotherapy--patient not a surgical candidate. Final decision to be made after recovery and  d/c to home. Therapies initiated yesterday and patient noted to have difficulty weight bearing through RLE, poor coordination of BUE as well as problems processing. MD, PT recommending CIR.   Post op A fib with RVR treated with amiodarone and lopressor with conversion to NSR.  Patient has had high levels of anxiety as well as SOB due to fluid overload. She developed recurrent A fib with RVR and Dr. Terrence Dupont was consulted for input. Amiodarone increased to 400 mg bid and patient started on IV heparin.  Amiodarone taper recommended as now in NSR. Therapies ongoing and patient has with activities due to anxiety and pain--showing improvement in ability to weight bear thorough RLE today. Has refused SNF but willing to try CIR x 1 week   Past Medical History  Past Medical History  Diagnosis Date  . Depression   . Panic attacks   . Hypertension   . Smoker   . Alcohol abuse     Family History  family history includes Arthritis in her mother; CAD in her father.  Prior Rehab/Hospitalizations: previous outpatient rehab for back issues 25 years ago   Current Medications  Current facility-administered medications:acetaminophen (TYLENOL) suppository 650 mg, 650 mg, Rectal, Q6H PRN, Renette Butters, MD;  acetaminophen (TYLENOL) tablet 650 mg, 650 mg, Oral, Q6H PRN, Renette Butters, MD;  Derrill Memo ON 07/01/2013] amiodarone (PACERONE) tablet 200 mg, 200 mg, Oral, BID, Verlee Monte, MD;  Derrill Memo ON 07/09/2013] amiodarone (PACERONE) tablet 200 mg, 200 mg, Oral, Daily, Verlee Monte, MD amiodarone (PACERONE) tablet 400 mg, 400 mg, Oral, BID, Verlee Monte, MD;  aspirin EC tablet 325 mg, 325 mg, Oral, Q breakfast, Renette Butters, MD, 325 mg at 06/29/13 1113;  budesonide (PULMICORT)  nebulizer solution 0.25 mg, 0.25 mg, Nebulization, BID, Brand Males, MD, 0.25 mg at 06/29/13 0802;  dextromethorphan-guaiFENesin (Alba DM) 30-600 MG per 12 hr tablet 1 tablet, 1 tablet, Oral, BID, Melton Alar, PA-C, 1 tablet at 06/29/13 1112 docusate sodium (COLACE) capsule 100 mg, 100 mg, Oral, BID, Renette Butters, MD, 100 mg at 06/29/13 1111;  feeding supplement (ENSURE COMPLETE) (ENSURE COMPLETE) liquid 237 mL, 237 mL, Oral, BID BM, Erlene Quan, RD, 237 mL at 44/03/47 4259;  folic acid (FOLVITE) tablet 1 mg, 1 mg, Oral, Daily, Nita Sells, MD, 1 mg at 06/29/13 1111;  furosemide (LASIX) tablet 20 mg, 20 mg, Oral, Daily, Bobby Rumpf York, PA-C, 20 mg at 06/29/13 1112 HYDROcodone-acetaminophen (NORCO/VICODIN) 5-325 MG per tablet 1-2 tablet, 1-2 tablet, Oral, Q6H PRN, Nita Sells, MD, 2 tablet at 06/29/13 1116;  levalbuterol (XOPENEX) nebulizer solution 0.63 mg, 0.63 mg, Nebulization, QID, Verlee Monte, MD, 0.63 mg at 06/29/13 1102;  LORazepam (ATIVAN) tablet 1 mg, 1 mg, Oral, Q4H PRN, Verlee Monte, MD, 1 mg at 06/29/13 0803 metoCLOPramide (REGLAN) injection 5-10 mg, 5-10 mg, Intravenous, Q8H PRN, Renette Butters, MD;  metoCLOPramide (REGLAN) tablet 5-10 mg, 5-10 mg, Oral, Q8H PRN, Renette Butters, MD;  metoprolol tartrate (LOPRESSOR) tablet 25 mg, 25 mg, Oral, BID, Verlee Monte, MD, 25 mg at 06/29/13 1112;  morphine 2 MG/ML injection 1-2 mg, 1-2 mg, Intravenous, Q2H PRN, Wilhelmina Mcardle, MD, 2 mg at 06/29/13 0540 multivitamin with minerals tablet 1 tablet, 1 tablet, Oral, Daily, Nita Sells, MD, 1 tablet at 06/29/13 1112;  sertraline (ZOLOFT) tablet 100 mg, 100 mg, Oral, Daily, Nita Sells, MD, 100 mg at 06/29/13 1112;  sodium chloride 0.9 % injection 10-40 mL, 10-40 mL, Intracatheter, Q12H, Brand Males, MD, 20 mL at 06/25/13 1023 sodium chloride 0.9 % injection 10-40 mL, 10-40 mL, Intracatheter, PRN, Brand Males, MD, 10 mL at 06/28/13 5638;   thiamine (VITAMIN B-1) tablet 100 mg, 100 mg, Oral, Daily, Nita Sells, MD, 100 mg at 06/29/13 1112  Patients Current Diet: General  Precautions / Restrictions Precautions Precautions: Fall Restrictions Weight Bearing Restrictions: Yes RLE Weight Bearing: Weight bearing as tolerated   Prior Activity Level Household:  (prefers to stay at home, goes out once/week, still drives)  Development worker, international aid / [place] Devices/Equipment: None  Prior Functional Level Prior Function Level of Independence: Independent Comments: 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  Current Functional Level Cognition  Overall Cognitive Status: Within Functional Limits for tasks assessed Orientation Level: Oriented to person;Oriented to place;Disoriented to time    Extremity Assessment (includes Sensation/Coordination)          ADLs  Eating/Feeding: Independent Where Assessed - Eating/Feeding: Bed  level;Chair Grooming: Wash/dry hands;Wash/dry face;Teeth care;Set up Where Assessed - Grooming: Supported sitting Upper Body Bathing: Set up Where Assessed - Upper Body Bathing: Supported sitting Lower Body Bathing: Maximal assistance Where Assessed - Lower Body Bathing: Supported sit to stand Upper Body Dressing: Minimal assistance Where Assessed - Upper Body Dressing: Supported sitting Lower Body Dressing: +1 Total assistance Where Assessed - Lower Body Dressing: Supported sit to Lobbyist: Maximal Print production planner: Patient Percentage: 60% Armed forces technical officer Method: Sit to Loss adjuster, chartered: Therapist, occupational and Hygiene: Maximal assistance Where Assessed - Best boy and Hygiene: Sit to stand from 3-in-1 or toilet Equipment Used: Rolling walker Transfers/Ambulation Related to ADLs: mod - max A sit to stand ADL Comments: Pt unable to  access feet for LB ADLs.  Pt fatigues quickly    Mobility  Bed Mobility: Supine to Sit;Sitting - Scoot to Edge of Bed;Sit to Supine Supine to Sit: 3: Mod assist;HOB elevated Sitting - Scoot to Edge of Bed: 4: Min assist Sit to Supine: 3: Mod assist;HOB flat    Transfers  Sit to Stand: 3: Mod assist;2: Max assist;From bed Stand to Sit: 3: Mod assist;With upper extremity assist;To bed    Ambulation / Gait / Stairs / Wheelchair Mobility  Ambulation/Gait Ambulation Distance (Feet): 5 Feet General Gait Details: Pt with rt hip and knee flexing with wt bearing.  Manual facilitation at rt hip to promote extension and wt bearing on rt leg.    Posture / Balance Static Sitting Balance Static Sitting - Balance Support: Bilateral upper extremity supported;Feet supported Static Sitting - Level of Assistance: 5: Stand by assistance Dynamic Sitting Balance Dynamic Sitting - Comments: pt continues to demonstrate lateral lean to the left today, pillows positioned to assist with alignment of trunk    Special needs/care consideration BiPAP/CPAP no CPM no Continuous Drip IV no Dialysis no         Life Vest no Oxygen - 2 L Special Bed no Trach Size no Wound Vac (area) no       Skin - ? Eczema (uses cream), R hip wound                              Bowel mgmt: last BM 06-29-13 Bladder mgmt: using bedpan, some urinary incontinence Diabetic mgmt no    Previous Home Environment Living Arrangements: Children Home Care Services: No Additional Comments: Pt lived with dtr and infant grandson in a split [place] home. Must ascend and descend steps daily;   Discharge Living Setting Plans for Discharge Living Setting: Patient's home;Lives with (comment) (lives with dtr, 74 yo grandson & younger grandchild) Type of Home at Discharge: House Discharge Home Layout: Two level Alternate Level Stairs-Number of Steps: flight Discharge Home Access: Stairs to enter;Ramped entrance (ramp at front entrance, 2 steps at  back, then 1 step down) Does the patient have any problems obtaining your medications?: No  Social/Family/Support Systems Patient Roles: Caregiver (was caregiver for young grandchild during the day/evening ) Contact Information: Despina Hidden [phone], cell [phone] Anticipated Caregiver: [name] Ability/Limitations of Caregiver: Dtr has a small child and does work nights Caregiver Availability:  (dtr is working nights as well) Discharge Plan Discussed with Primary Caregiver: Yes Is Caregiver In Agreement with Plan?: Yes Does Caregiver/Family have Issues with Lodging/Transportation while Pt is in Rehab?: No  Goals/Additional Needs Patient/Family Goal for Rehab: sup to min assist w/ PT & OT  Expected length of stay: 10-12 days Cultural Considerations: none Dietary Needs: regular Equipment Needs: to be determined Special Service Needs: none Pt/Family Agrees to Admission and willing to participate: Yes Program Orientation Provided & Reviewed with Pt/Caregiver Including Roles  & Responsibilities: Yes   Decrease burden of Care through IP rehab admission: NA    Possible need for SNF placement upon discharge: not anticipated   Patient Condition: This patient's medical and functional status has changed since the consult dated: 06-25-13 in which the Rehabilitation Physician determined and documented that the patient's condition is appropriate for intensive rehabilitative care in an inpatient rehabilitation facility. See "History of Present Illness" (above) for medical update. Functional changes are: mod assist +2 ambulating 5'. Patient's medical and functional status update has been discussed with the Rehabilitation physician and patient remains appropriate for inpatient rehabilitation. Will admit to inpatient rehab today.  Preadmission Screen Completed By:  Ave Filter, 06/29/2013 12:52 PM ______________________________________________________________________   Discussed status with  Dr. Naaman Plummer on 06-29-2013 at 12:50pm  and received telephone approval for admission today.  Admission Coordinator:  Elinor Kleine L. Dahmir Epperly,PT time 2:50 pm/Date1-13-2015

## 2013-06-30 ENCOUNTER — Inpatient Hospital Stay (HOSPITAL_COMMUNITY)

## 2013-06-30 ENCOUNTER — Inpatient Hospital Stay (HOSPITAL_COMMUNITY): Admitting: Occupational Therapy

## 2013-06-30 ENCOUNTER — Encounter (HOSPITAL_COMMUNITY): Payer: Self-pay | Admitting: Orthopedic Surgery

## 2013-06-30 DIAGNOSIS — S72143A Displaced intertrochanteric fracture of unspecified femur, initial encounter for closed fracture: Secondary | ICD-10-CM

## 2013-06-30 DIAGNOSIS — C3490 Malignant neoplasm of unspecified part of unspecified bronchus or lung: Secondary | ICD-10-CM

## 2013-06-30 DIAGNOSIS — W19XXXA Unspecified fall, initial encounter: Secondary | ICD-10-CM

## 2013-06-30 LAB — COMPREHENSIVE METABOLIC PANEL
ALBUMIN: 2.3 g/dL — AB (ref 3.5–5.2)
ALT: 46 U/L — AB (ref 0–35)
ALT: 43 U/L — ABNORMAL HIGH (ref 0–35)
AST: 37 U/L (ref 0–37)
AST: 31 U/L (ref 0–37)
Albumin: 2.5 g/dL — ABNORMAL LOW (ref 3.5–5.2)
Alkaline Phosphatase: 162 U/L — ABNORMAL HIGH (ref 39–117)
Alkaline Phosphatase: 159 U/L — ABNORMAL HIGH (ref 39–117)
BUN: 10 mg/dL (ref 6–23)
BUN: 10 mg/dL (ref 6–23)
CO2: 35 meq/L — AB (ref 19–32)
CO2: 32 mEq/L (ref 19–32)
CREATININE: 0.45 mg/dL — AB (ref 0.50–1.10)
Calcium: 8.8 mg/dL (ref 8.4–10.5)
Calcium: 8.8 mg/dL (ref 8.4–10.5)
Chloride: 89 mEq/L — ABNORMAL LOW (ref 96–112)
Chloride: 90 mEq/L — ABNORMAL LOW (ref 96–112)
Creatinine, Ser: 0.4 mg/dL — ABNORMAL LOW (ref 0.50–1.10)
GFR calc Af Amer: >90 mL/min (ref >90)
GFR calc non Af Amer: >90 mL/min (ref >90)
GFR calc non Af Amer: >90 mL/min (ref >90)
GLUCOSE: 115 mg/dL — AB (ref 70–99)
Glucose, Bld: 144 mg/dL — ABNORMAL HIGH (ref 70–99)
POTASSIUM: 3.6 meq/L — AB (ref 3.7–5.3)
Potassium: 3.8 mEq/L (ref 3.7–5.3)
Sodium: 132 mEq/L — ABNORMAL LOW (ref 137–147)
Sodium: 133 mEq/L — ABNORMAL LOW (ref 137–147)
TOTAL PROTEIN: 5.9 g/dL — AB (ref 6.0–8.3)
Total Bilirubin: 1.3 mg/dL — ABNORMAL HIGH (ref 0.3–1.2)
Total Bilirubin: 1.1 mg/dL (ref 0.3–1.2)
Total Protein: 6.1 g/dL (ref 6.0–8.3)

## 2013-06-30 LAB — CBC WITH DIFFERENTIAL/PLATELET
Basophils Absolute: 0 10*3/uL (ref 0.0–0.1)
Basophils Relative: 0 % (ref 0–1)
EOS ABS: 0.3 10*3/uL (ref 0.0–0.7)
Eosinophils Relative: 2 % (ref 0–5)
HCT: 29.7 % — ABNORMAL LOW (ref 36.0–46.0)
Hemoglobin: 10.1 g/dL — ABNORMAL LOW (ref 12.0–15.0)
LYMPHS PCT: 8 % — AB (ref 12–46)
Lymphs Abs: 1.2 10*3/uL (ref 0.7–4.0)
MCH: 33.6 pg (ref 26.0–34.0)
MCHC: 34 g/dL (ref 30.0–36.0)
MCV: 98.7 fL (ref 78.0–100.0)
MONOS PCT: 10 % (ref 3–12)
Monocytes Absolute: 1.6 10*3/uL — ABNORMAL HIGH (ref 0.1–1.0)
NEUTROS PCT: 81 % — AB (ref 43–77)
Neutro Abs: 13.3 10*3/uL — ABNORMAL HIGH (ref 1.7–7.7)
PLATELETS: 478 10*3/uL — AB (ref 150–400)
RBC: 3.01 MIL/uL — AB (ref 3.87–5.11)
RDW: 14.4 % (ref 11.5–15.5)
WBC: 16.4 10*3/uL — AB (ref 4.0–10.5)

## 2013-06-30 MED ORDER — LORAZEPAM 0.5 MG PO TABS
0.5000 mg | ORAL_TABLET | Freq: Once | ORAL | Status: AC
  Administered 2013-06-30: 0.5 mg via ORAL
  Filled 2013-06-30: qty 1

## 2013-06-30 MED ORDER — SODIUM CHLORIDE 0.9 % IJ SOLN
10.0000 mL | INTRAMUSCULAR | Status: DC | PRN
  Administered 2013-06-30: 20 mL
  Administered 2013-07-01 – 2013-07-02 (×3): 10 mL
  Administered 2013-07-03 – 2013-07-04 (×3): 20 mL
  Administered 2013-07-05 (×2): 10 mL
  Administered 2013-07-05 – 2013-07-07 (×2): 20 mL

## 2013-06-30 MED ORDER — LEVALBUTEROL HCL 0.63 MG/3ML IN NEBU
0.6300 mg | INHALATION_SOLUTION | Freq: Three times a day (TID) | RESPIRATORY_TRACT | Status: DC
  Administered 2013-06-30 – 2013-07-07 (×16): 0.63 mg via RESPIRATORY_TRACT
  Filled 2013-06-30 (×24): qty 3

## 2013-06-30 MED ORDER — POTASSIUM CHLORIDE CRYS ER 20 MEQ PO TBCR
20.0000 meq | EXTENDED_RELEASE_TABLET | Freq: Two times a day (BID) | ORAL | Status: DC
  Administered 2013-06-30 – 2013-07-13 (×25): 20 meq via ORAL
  Filled 2013-06-30 (×31): qty 1

## 2013-06-30 MED ORDER — ACETAMINOPHEN 325 MG PO TABS
650.0000 mg | ORAL_TABLET | Freq: Three times a day (TID) | ORAL | Status: DC
  Administered 2013-06-30 – 2013-07-05 (×17): 650 mg via ORAL
  Filled 2013-06-30 (×15): qty 2

## 2013-06-30 MED ORDER — DOCUSATE SODIUM 50 MG/5ML PO LIQD
100.0000 mg | Freq: Two times a day (BID) | ORAL | Status: DC
  Administered 2013-07-01 – 2013-07-02 (×3): 100 mg via ORAL
  Filled 2013-06-30 (×10): qty 10

## 2013-06-30 MED ORDER — LEVALBUTEROL HCL 0.63 MG/3ML IN NEBU
0.6300 mg | INHALATION_SOLUTION | Freq: Four times a day (QID) | RESPIRATORY_TRACT | Status: DC | PRN
  Administered 2013-06-30 – 2013-07-09 (×2): 0.63 mg via RESPIRATORY_TRACT
  Filled 2013-06-30: qty 3

## 2013-06-30 MED ORDER — BIOTENE DRY MOUTH MT LIQD
15.0000 mL | Freq: Two times a day (BID) | OROMUCOSAL | Status: DC
  Administered 2013-06-30 – 2013-07-13 (×24): 15 mL via OROMUCOSAL

## 2013-06-30 NOTE — Progress Notes (Signed)
Patient complaining of visual hallucinations.  She states she can see ants crawling on the wall and that the hallucinations began prior to her fall.  Dr. Letta Pate notified of hallucinations; MD also notified that patient had two liquid stools during the night and that patient was anxious much of the night.  Dr. Letta Pate to assess patient; will continue to monitor.

## 2013-06-30 NOTE — Patient Care Conference (Signed)
Inpatient RehabilitationTeam Conference and Plan of Care Update Date: 06/30/2013   Time: 10:51 AM    Patient Name: Stephanie Oliver      Medical Record Number: [id number]  Date of Birth: Feb 19, 1951 Sex: Female         Room/Bed: 4M02C/4M02C-01 Payor Info: Payor: Theme park manager / Plan: [employer] / Product Type: *No Product type* /    Admitting Diagnosis: R HIP FX  Admit Date/Time:  06/29/2013  6:20 PM Admission Comments: No comment available   Primary Diagnosis:  <principal problem not specified> Principal Problem: <principal problem not specified>  Patient Active Problem List   Diagnosis Date Noted  . Hip fracture requiring operative repair 06/29/2013  . PAF (paroxysmal atrial fibrillation) 06/24/2013  . Non-small cell cancer of right lung 06/24/2013  . Encephalopathy acute 06/23/2013  . Mediastinal lymphadenopathy 06/22/2013  . COPD (chronic obstructive pulmonary disease) 06/22/2013  . Tobacco use disorder 06/22/2013  . Intertrochanteric fracture of right hip 06/21/2013  . Alcohol abuse, continuous 06/21/2013  . Former smoker 06/21/2013  . Hypertension 06/21/2013  . Osteoporosis 06/21/2013  . Bipolar 1 disorder 06/21/2013  . Hip fracture 06/21/2013    Expected Discharge Date: Expected Discharge Date: 07/13/13  Team Members Present: Physician leading conference: Dr. Alysia Penna Social Worker Present: Ovidio Kin, LCSW Nurse Present: Other (comment) Conni Elliot) PT Present: Georjean Mode, PT;Other (comment) Jilda Roche) OT Present: Gareth Morgan, Starling Manns, OT SLP Present: Germain Osgood, SLP PPS Coordinator present : Daiva Nakayama, RN, CRRN     Current Status/Progress Goal Weekly Team Focus  Medical   EtOH withdrawl, anxiety, depression, poor transfers  psychology eval  manage anxiety   Bowel/Bladder   Incontinent of bowel and bladder at times (incontinence is new since the hip fracture per patient report)  Manage bowel and bladder with  timed toileting and briefs  Limit number of incontinent episodes with timed toileting q3hr during the and and before bed   Swallow/Nutrition/ Hydration     [facility]        ADL's   total assist with LB self care, max A with toilet and shower transfer with grab bars.  min A with LB self care, min A with transfers  ADL retraining, functional mobility training, Ue strengthening.   Mobility   Mod A bed mobility, transfers, short distance gait  S bed mobility, Min A transfers and gait  Standing tolerance, all functional mobility, and gait with RW   Communication     [facility]        Safety/Cognition/ Behavioral Observations  Mod A for bed mobility and transfers, Mod A gait in // bars x16', Mod A 1 step with 2 rails  Min A goals set, but pt will need to be Mod I per her mother  bed mobility and transfers, standing tolerance, gait wtih RW, and stairs.    Pain   Denies  </=3  Monitor for nonverbal cues of pain   Skin   Bruising to right hip/thigh/buttocks/groin; three incisions to right hip/leg- steri strips to top incision, buttocks excoriated; scabs to BLE  No new skin breakdown  Ensure patient turns q2hr      *See Care Plan and progress notes for long and short-term goals.  Barriers to Discharge: complicated social situation    Possible Resolutions to Barriers:  cont rehab, SW to assess    Discharge Planning/Teaching Needs:    Pt plans to return home within a week.  Daughter there during the day until goes to work at 2;30pm.  Pt cared for 57mo grandson.     Team Discussion:  Pt seeing ants and flies on staff members.  Good knee buckles-concerns regarding goals and needing assist at discharge.  Will talk with daughter and pt  Revisions to Treatment Plan:  New eval   Continued Need for Acute Rehabilitation Level of Care: The patient requires daily medical management by a physician with specialized training in physical medicine and rehabilitation for the following conditions: Daily direction of a  multidisciplinary physical rehabilitation program to ensure safe treatment while eliciting the highest outcome that is of practical value to the patient.: Yes Daily medical management of patient stability for increased activity during participation in an intensive rehabilitation regime.: Yes Daily analysis of laboratory values and/or radiology reports with any subsequent need for medication adjustment of medical intervention for : Post surgical problems;Other  [name] 07/02/2013, 10:51 AM

## 2013-06-30 NOTE — Progress Notes (Signed)
Patient information reviewed and entered into eRehab system by Abygail Galeno, RN, CRRN, PPS Coordinator.  Information including medical coding and functional independence measure will be reviewed and updated through discharge.    

## 2013-06-30 NOTE — Progress Notes (Signed)
Patient unable to get comfortable in bed or sleep.  Patient states "I have a lot on my mind.  I cannot go on like this."  Patient requests medicine to calm her nerves; alternative interventions ineffective to ease anxiety.  Patient recently received ativan PRN at 0124.  Algis Liming, PA notified of patient request.  New order received; will continue to monitor.

## 2013-06-30 NOTE — Evaluation (Signed)
Physical Therapy Assessment and Plan  Patient Details  Name: Carliss Porcaro MRN: [id number] Date of Birth: 17-Jan-1951  PT Diagnosis: Difficulty walking, Edema, Muscle weakness and Pain in joint- R hip Rehab Potential: Good ELOS: 14-16 days   Today's Date: 06/30/2013 Time: 1120-1210 Time Calculation (min): 50 min  Problem List:  Patient Active Problem List   Diagnosis Date Noted  . Hip fracture requiring operative repair 06/29/2013  . PAF (paroxysmal atrial fibrillation) 06/24/2013  . Non-small cell cancer of right lung 06/24/2013  . Encephalopathy acute 06/23/2013  . Mediastinal lymphadenopathy 06/22/2013  . COPD (chronic obstructive pulmonary disease) 06/22/2013  . Tobacco use disorder 06/22/2013  . Intertrochanteric fracture of right hip 06/21/2013  . Alcohol abuse, continuous 06/21/2013  . Former smoker 06/21/2013  . Hypertension 06/21/2013  . Osteoporosis 06/21/2013  . Bipolar 1 disorder 06/21/2013  . Hip fracture 06/21/2013    Past Medical History:  Past Medical History  Diagnosis Date  . Depression   . Panic attacks   . Hypertension   . Smoker   . Alcohol abuse    Past Surgical History:  Past Surgical History  Procedure Laterality Date  . Tonsillectomy    . Back surgery    . Femur im nail Right 06/22/2013    Procedure: RIGHT HIP GAMMA NAIL FIXATION   ;  Surgeon: Renette Butters, MD;  Location: [place];  Service: Orthopedics;  Laterality: Right;  . Video bronchoscopy with endobronchial ultrasound N/A 06/22/2013    Procedure: VIDEO BRONCHOSCOPY WITH ENDOBRONCHIAL ULTRASOUND;  Surgeon: Doree Fudge, MD;  Location: [place];  Service: Pulmonary;  Laterality: N/A;    Assessment & Plan Clinical Impression:   Patient transferred to CIR on 06/29/2013 .   Patient currently requires total with mobility secondary to muscle weakness and decreased cardiorespiratoy endurance and decreased oxygen support.  Prior to hospitalization, patient was independent  with mobility  and lived with Family in a House home.  Home access is 2  (side entry to lower level- no BR; front entrance to split level is ramped)Stairs to enter;Ramped entrance.  Patient will benefit from skilled PT intervention to maximize safe functional mobility, minimize fall risk and decrease caregiver burden for planned discharge home with 24 hour assist.  Anticipate patient will benefit from follow up [facility] at discharge.  PT - End of Session Activity Tolerance: Tolerates < 10 min activity, no significant change in vital signs Endurance Deficit: Yes Endurance Deficit Description: on O2 2 L via [place]; SOB with speaking PT Assessment Rehab Potential: Good Barriers to Discharge: Decreased caregiver support;Inaccessible home environment PT Patient demonstrates impairments in the following area(s): Balance;Behavior;Edema;Endurance;Motor;Pain PT Transfers Functional Problem(s): Bed Mobility;Bed to Chair;Car;Furniture PT Locomotion Functional Problem(s): Ambulation;Wheelchair Mobility;Stairs PT Plan PT Intensity: Minimum of 1-2 x/day ,45 to 90 minutes PT Frequency: 5 out of 7 days PT Duration Estimated Length of Stay: 14-16 days PT Treatment/Interventions: Ambulation/gait training;Balance/vestibular training;Cognitive remediation/compensation;Discharge planning;Neuromuscular re-education;Functional mobility training;DME/adaptive equipment instruction;Pain management;Patient/family education;Psychosocial support;Splinting/orthotics;UE/LE Strength taining/ROM;UE/LE Coordination activities;Therapeutic Exercise;Therapeutic Activities;Stair training;Wheelchair propulsion/positioning PT Transfers Anticipated Outcome(s): min assist for basic and car PT Locomotion Anticipated Outcome(s): min assist gait x 25'; min assist up/down 6 steps for home access to upper level with BR; supervision w/c x 50' home or controlled env; 24 hour supervision PT Recommendation Follow Up Recommendations: Home health PT Patient destination:  Home Equipment Recommended: Rolling walker with 5" wheels;Wheelchair (measurements);Wheelchair cushion (measurements)  Skilled Therapeutic Intervention  Tx 1: w/c propulsion using bil UEs(hand over hand assist to initiate), x 10' before fatiguing.  W/c brakes management with hand over hand assistance.  Pt returned to room for OT eval.  Tx 2:  W/c propulsion x 10' with supervision after initially placing pt's hands on w/c rims.  Locking/inlocking w/c brakes with tactile cues.  Stood x 30 seconds at parallel bars while w/c switched for one that fits pt better.    Gait with RW, +2 assist, x 3'.   Therapeutic exercise performed with bil LE to increase strength for functional mobility: sitting knee ext with ankle pumps at end range; 10 x 1 each..   W/c> bed stand pivot transfer to L, max assist.  Scooting to [facility] using bil hands on bed rails, pushing with bil LEs, min assist.  Pt positioned sitting up, pillow under R LE; set up for lunch.  Bed alarm set.    Pt very lethargic, nodding off repeatedly during session.  At end of session, pt asked when she would get more medicine for pain.  PT Evaluation Precautions/Restrictions- fall; WBAT RLE   General   Vital Signs   BP 97/65; HR 74, O2 sats 95-98% on 2 L O2 via .  Pt SOB in supported sitting, while speaking. Pain Pain Assessment Pain Assessment: 0-10 Pain Score: 0-No pain Faces Pain Scale:8 Pain Type: Surgical pain Pain Location: Hip Pain Orientation: Right Pain Descriptors / Indicators: Aching Pain Onset: Gradual Pain Intervention(s): RN made aware; meds dispensed during tx PAINAD (Pain Assessment in Advanced Dementia) Breathing: normal Critical Care Pain Observation Tool (CPOT) Facial Expression: Relaxed, neutral Home Living/Prior Functioning- 2 steps no rails to enter lower level of home, which pt reported does not have BR; ramped entrance to split level front entrance, where there are 6 steps up Pt retired, lives with  daughter and grandson.  Pt cared for 13 month grandson QD 2:30 PM - 1:00 AM while daughter worked. Pt drove and was able to take stairs QD.   Vision/Perception  Vision - History Baseline Vision: Wears glasses all the time Vision - Assessment Eye Alignment: Within Functional Limits Vision Assessment: Vision not tested Perception Perception: Within Functional Limits Praxis Praxis: Intact  Cognition- lethargic, oriented x 4, but confused about sequence of events during session Problem Solving: Impaired Problem Solving Impairment: Functional basic;Other (comment) (delayed processing) Sensation Sensation Light Touch: Appears Intact Stereognosis: Appears Intact Hot/Cold: Appears Intact Proprioception: Appears Intact Coordination Fine Motor Movements are Fluid and Coordinated: Yes Motor  Motor Motor: Within Functional Limits Motor - Skilled Clinical Observations: generalized weakness  Mobility Bed Mobility Bed Mobility: Rolling Right;Right Sidelying to Sit;Sitting - Scoot to Edge of Bed (HOB elevated) Rolling Right: 3: Mod assist Rolling Right Details: Verbal cues for technique Right Sidelying to Sit Details: Manual facilitation for weight shifting;Verbal cues for technique Supine to Sit: 2: Max assist Sitting - Scoot to Edge of Bed: 2: Max assist Sitting - Scoot to [employer] of Bed Details: Manual facilitation for weight shifting;Verbal cues for technique Transfers Transfers: Yes Sit to Stand: 2: Max assist Sit to Stand Details: Verbal cues for technique;Verbal cues for precautions/safety;Tactile cues for weight shifting;Manual facilitation for weight shifting Stand to Sit: 1: +1 Total assist (uncontrolled descend suddenly when L knee buckled) Stand Pivot Transfers: 1: +1 Total assist Locomotion  Ambulation Ambulation: No Ambulation/Gait Assistance: 2: Max assist Gait Gait: yes; x 3' with max assist of 1 person, assist of another person to bring w/c up behind. Gait includes:  trunk flexion, narrow BOS, antalgic, decreased wt bearing on RLE.  Pt able to progress R foot forward,  and take small hops with L foot, WBAT RLE. Architect: Yes Wheelchair Assistance: 1: +1 Total assist Wheelchair Assistance Details:  (pt unable to problem solve where to place hands) Environmental health practitioner: Both upper extremities Wheelchair Parts Management: Needs assistance Distance: 0  Trunk/Postural Assessment  Cervical Assessment Cervical Assessment: Within Functional Limits Thoracic Assessment Thoracic Assessment: Within Functional Limits Lumbar Assessment Lumbar Assessment: Exceptions to WFL (decreased wt bearing R hip) Postural Control Postural Control: Deficits on evaluation Postural Limitations: trunk flexion  Balance Balance Balance Assessed: Yes Static Sitting Balance Static Sitting - Balance Support: Bilateral upper extremity supported;Feet supported Static Sitting - Level of Assistance: 5: Stand by assistance Dynamic Sitting Balance Sitting balance - Comments: pt continues to demonstrate lateral lean to the left today, pillows positioned to assist with alignment of trunk Static Standing Balance Static Standing - Level of Assistance: 2: Max assist Dynamic Standing Balance Dynamic Standing - Level of Assistance: Not tested (comment) Extremity Assessment  RUE Assessment RUE Assessment: Within Functional Limits LUE Assessment LUE Assessment: Within Functional Limits RLE Assessment RLE Assessment: Exceptions to [facility] (minimal edema lower leg and foot) RLE Strength RLE Overall Strength Comments: grossly 2-/5 hip flexion, abd/adduction; 3-/5 knee ext, 4/5 ankle DF LLE Assessment LLE Assessment: Exceptions to [facility] (minimal edema foot) LLE Strength LLE Overall Strength Comments: grossly 4/5 throughout, but knee buckled during bed> w/c transfer  FIM:  FIM - Control and instrumentation engineer Devices: HOB elevated Bed/Chair  Transfer: 2: Chair or W/C > Bed: Max A (lift and lower assist);1: Bed > Chair or W/C: Total A (helper does all/Pt. < 25%) FIM - Locomotion: Wheelchair Distance: 0 Locomotion: Wheelchair: 1: Total Assistance/staff pushes wheelchair (Pt<25%) FIM - Locomotion: Ambulation Locomotion: Ambulation Assistive Devices: Administrator Ambulation/Gait Assistance: 2: Max assist Locomotion: Ambulation: 1: Two helpers (2nd person to follow closely with w/c) FIM - Locomotion: Stairs Locomotion: Stairs: 0: Activity did not occur   Refer to Care Plan for Long Term Goals  Recommendations for other services: None  Discharge Criteria: Patient will be discharged from PT if patient refuses treatment 3 consecutive times without medical reason, if treatment goals not met, if there is a change in medical status, if patient makes no progress towards goals or if patient is discharged from hospital.  The above assessment, treatment plan, treatment alternatives and goals were discussed and mutually agreed upon: by patient  Elmor Kost 06/30/2013, 1:09 PM

## 2013-06-30 NOTE — Care Management Note (Signed)
[facility] Individual Statement of Services  Patient Name:  Stephanie Oliver  Date:  06/30/2013  Welcome to the [facility].  Our goal is to provide you with an individualized program based on your diagnosis and situation, designed to meet your specific needs.  With this comprehensive rehabilitation program, you will be expected to participate in at least 3 hours of rehabilitation therapies Monday-Friday, with modified therapy programming on the weekends.  Your rehabilitation program will include the following services:  Physical Therapy (PT), Occupational Therapy (OT), 24 hour per day rehabilitation nursing, Case Management (Social Worker), Rehabilitation Medicine, Nutrition Services and Pharmacy Services  Weekly team conferences will be held on Wednesday to discuss your progress.  Your Social Worker will talk with you frequently to get your input and to update you on team discussions.  Team conferences with you and your family in attendance may also be held.  Expected length of stay: 2 weeks Overall anticipated outcome: min level  Depending on your progress and recovery, your program may change. Your Social Worker will coordinate services and will keep you informed of any changes. Your Social Worker's name and contact numbers are listed  below.  The following services may also be recommended but are not provided by the [facility] will be made to provide these services after discharge if needed.  Arrangements include referral to agencies that provide these services.  Your insurance has been verified to be:  [place] Your primary doctor is:  Dr Carol Ada  Pertinent information will be shared with your doctor and your insurance company.  Social Worker:  Ovidio Kin, [place] or ([phone]  Information discussed with and copy given to patient by: [name], 06/30/2013, 3:31 PM

## 2013-06-30 NOTE — Progress Notes (Signed)
INITIAL NUTRITION ASSESSMENT  DOCUMENTATION CODES Per approved criteria  -Not Applicable   INTERVENTION: Provide Ensure Complete BID Provide Multivitamin with minerals daily RD to continue to monitor  NUTRITION DIAGNOSIS: Increased nutrient needs related to recent surgery and decreased appetite as evidenced by healing, participation in rehab.   Goal: Pt to meet >/= 90% of their estimated nutrition needs   Monitor:  Diet advancement PO intake Weight trends Labs  Reason for Assessment: Consult/ MST  63 y.o. female  Admitting Dx: hip fx  ASSESSMENT: 63 yo active smoker with reported weight loss since 2 years ago admitted for hip fracture and noted to have large rite sided lung mass.  Underwent R hip nail fixation and bronchoscopy on 1/7. Pt found incidentally to have a large 7.2 x 5.0 x 6.4 cm necrotic right upper lobe mass, most consistent with primary lung malignancy.   Pt intake remains variable.  She is eating cake at time of visit.  She states she is eating per her usual.  Discussed increased needs for healing.  Will order Ensure Complete BID.  Height: Ht Readings from Last 1 Encounters:  06/29/13 5\' 3"  (1.6 m)    Weight: Wt Readings from Last 1 Encounters:  06/30/13 146 lb 2.6 oz (66.3 kg)    Ideal Body Weight: 115 lbs  % Ideal Body Weight: 126%  Wt Readings from Last 10 Encounters:  06/30/13 146 lb 2.6 oz (66.3 kg)  06/29/13 126 lb 8.7 oz (57.4 kg)  06/29/13 126 lb 8.7 oz (57.4 kg)    Usual Body Weight: 135 lbs per pt  % Usual Body Weight: 108%  BMI:  Body mass index is 25.9 kg/(m^2).  Estimated Nutritional Needs: Kcal: 1500-1700 Protein: 70-80 grams Fluid: >/= 1.7 L/day  Skin: +3 RLE ; right hip incision  Diet Order: General  EDUCATION NEEDS: -No education needs identified at this time   Intake/Output Summary (Last 24 hours) at 06/30/13 1524 Last data filed at 06/30/13 0800  Gross per 24 hour  Intake    120 ml  Output      0 ml  Net     120 ml    Last BM: 1/14  Labs:   Recent Labs Lab 06/25/13 0525 06/26/13 0500 06/27/13 0427 06/29/13 0600 06/30/13 0452 06/30/13 1125  NA 134* 135* 138 134* 132* 133*  K 3.8 3.8 4.0 3.4* 3.6* 3.8  CL 93* 93* 91* 89* 89* 90*  CO2 31 32 38* 35* 35* 32  BUN 9 14 15 11 10 10   CREATININE 0.35* 0.34* 0.33* 0.39* 0.40* 0.45*  CALCIUM 8.5 8.7 8.6 8.3* 8.8 8.8  MG 1.3* 1.4* 2.1  --   --   --   PHOS 2.6 3.1 3.4  --   --   --   GLUCOSE 119* 113* 98 135* 115* 144*    CBG (last 3)  No results found for this basename: GLUCAP,  in the last 72 hours  Scheduled Meds: . acetaminophen  650 mg Oral TID WC & HS  . [START ON 07/02/2013] amiodarone  200 mg Oral BID   And  . [START ON 07/09/2013] amiodarone  200 mg Oral Daily  . amiodarone  400 mg Oral BID  . antiseptic oral rinse  15 mL Mouth Rinse BID  . apixaban  5 mg Oral BID  . budesonide  0.25 mg Nebulization BID  . dextromethorphan-guaiFENesin  1 tablet Oral BID  . docusate  100 mg Oral BID  . feeding supplement (ENSURE COMPLETE)  237 mL Oral BID BM  . folic acid  1 mg Oral Daily  . furosemide  20 mg Oral Daily  . iron polysaccharides  150 mg Oral Daily  . levalbuterol  0.63 mg Nebulization QID  . metoprolol tartrate  25 mg Oral BID  . multivitamin with minerals  1 tablet Oral Daily  . potassium chloride  20 mEq Oral BID  . sertraline  100 mg Oral Daily  . thiamine  100 mg Oral Daily    Continuous Infusions:    Past Medical History  Diagnosis Date  . Depression   . Panic attacks   . Hypertension   . Smoker   . Alcohol abuse     Past Surgical History  Procedure Laterality Date  . Tonsillectomy    . Back surgery    . Femur im nail Right 06/22/2013    Procedure: RIGHT HIP GAMMA NAIL FIXATION   ;  Surgeon: Renette Butters, MD;  Location: [place];  Service: Orthopedics;  Laterality: Right;  . Video bronchoscopy with endobronchial ultrasound N/A 06/22/2013    Procedure: VIDEO BRONCHOSCOPY WITH ENDOBRONCHIAL ULTRASOUND;   Surgeon: Doree Fudge, MD;  Location: [place];  Service: Pulmonary;  Laterality: N/A;    Brynda Greathouse, MS RD LDN Clinical Inpatient Dietitian Pager: [phone] Weekend/After hours pager: [phone]

## 2013-06-30 NOTE — Evaluation (Signed)
Occupational Therapy Assessment and Plan  Patient Details  Name: Stephanie Oliver MRN: [id number] Date of Birth: 1950-08-19  OT Diagnosis: muscle weakness (generalized) and pain in joint Rehab Potential: Rehab Potential: Good ELOS: 2 weeks   Today's Date: 06/30/2013 Time: 0900-1000 Time Calculation (min): 60 min  Problem List:  Patient Active Problem List   Diagnosis Date Noted  . Hip fracture requiring operative repair 06/29/2013  . PAF (paroxysmal atrial fibrillation) 06/24/2013  . Non-small cell cancer of right lung 06/24/2013  . Encephalopathy acute 06/23/2013  . Mediastinal lymphadenopathy 06/22/2013  . COPD (chronic obstructive pulmonary disease) 06/22/2013  . Tobacco use disorder 06/22/2013  . Intertrochanteric fracture of right hip 06/21/2013  . Alcohol abuse, continuous 06/21/2013  . Former smoker 06/21/2013  . Hypertension 06/21/2013  . Osteoporosis 06/21/2013  . Bipolar 1 disorder 06/21/2013  . Hip fracture 06/21/2013    Past Medical History:  Past Medical History  Diagnosis Date  . Depression   . Panic attacks   . Hypertension   . Smoker   . Alcohol abuse    Past Surgical History:  Past Surgical History  Procedure Laterality Date  . Tonsillectomy    . Back surgery    . Femur im nail Right 06/22/2013    Procedure: RIGHT HIP GAMMA NAIL FIXATION   ;  Surgeon: Renette Butters, MD;  Location: [place];  Service: Orthopedics;  Laterality: Right;  . Video bronchoscopy with endobronchial ultrasound N/A 06/22/2013    Procedure: VIDEO BRONCHOSCOPY WITH ENDOBRONCHIAL ULTRASOUND;  Surgeon: Doree Fudge, MD;  Location: [place];  Service: Pulmonary;  Laterality: N/A;    Assessment & Plan Clinical Impression:Stephanie Oliver is a 63 y.o. female with history of alcohol abuse, depression with anxiety, smoker who fell on 06/18/13 with onset of pain and difficulty wight bearing on RLE and inability to walk. She was admitted on 06/21/13 for work up. sje was found to have right  comminuted IT hip fracture as well as 7.2 x 5.0 x 6.4 cm necrotic right upper lobe mass (most consistent with primary lung malignancy) locally invasive to anterior mediastinum as well as enlarged right hilar node with smaller pericarnal nodes and additional sub-centimeter RUL pulmonary nodules. She underwent right hip IM nail fixation by Dr. Percell Miller as well as bronchoscopy with tracheobronchial needle aspiration of R-paratracheal mass. Bone biopsy of right hip negative for cancer. Cytology positive for non-small cell carcinoma. Dr. Humphrey Rolls consulted for input and recommends full workup and chemotherapy--patient not a surgical candidate. Final decision to be made after recovery and d/c to home.   Post op A fib with RVR treated with amiodarone and lopressor with conversion to NSR. Patient has had high levels of anxiety as well as SOB due to fluid overload. She developed recurrent A fib with RVR and Dr. Terrence Dupont was consulted for input. Amiodarone increased to 400 mg bid and patient started on IV heparin. Amiodarone taper recommended as now in NSR. Therapies ongoing and patient has with activities due to anxiety and pain--showing improvement in ability to weight bear thorough RLE today. Has refused SNF but willing to try CIR x 1 week. Patient transferred to CIR on 06/29/2013 .    Patient currently requires max with basic self-care skills secondary to muscle weakness, decreased oxygen support, delayed processing and decreased standing balance.  Prior to hospitalization, patient was independent with ADLs, driving, and caring for her 43 month old grandson.  Patient will benefit from skilled intervention to increase independence with basic self-care skills prior to discharge  home with care partner.  Anticipate patient will require minimal physical assistance and follow up home health.  OT - End of Session Activity Tolerance: Tolerates 10 - 20 min activity with multiple rests Endurance Deficit: Yes Endurance Deficit  Description: pt requires 2L of O2 OT Assessment Rehab Potential: Good Barriers to Discharge: Decreased caregiver support OT Patient demonstrates impairments in the following area(s): Balance;Cognition;Endurance;Pain OT Basic ADL's Functional Problem(s): Bathing;Dressing;Toileting OT Transfers Functional Problem(s): Toilet;Tub/Shower OT Additional Impairment(s): None OT Plan OT Intensity: Minimum of 1-2 x/day, 45 to 90 minutes OT Frequency: 5 out of 7 days OT Duration/Estimated Length of Stay: 2 weeks OT Treatment/Interventions: Cognitive remediation/compensation;Discharge planning;Balance/vestibular training;DME/adaptive equipment instruction;Functional mobility training;Pain management;Patient/family education;Self Care/advanced ADL retraining;Therapeutic Activities;UE/LE Strength taining/ROM;Therapeutic Exercise OT Self Feeding Anticipated Outcome(s): I OT Basic Self-Care Anticipated Outcome(s): min A with bathing in shower, min LB dressing for steadying assist OT Toileting Anticipated Outcome(s): min assist OT Bathroom Transfers Anticipated Outcome(s): min assist OT Recommendation Patient destination: Home Follow Up Recommendations: Home health OT Equipment Recommended: 3 in 1 bedside comode;Tub/shower bench   Skilled Therapeutic Intervention Pt seen for initial evaluation and OT POC, purpose and goals explained to patient. Pt did not have clothing with her, but she was anxious for a shower. Pt seen for ADL retraining of toileting, shower, and donning gown with focus on activity tolerance and transfers. Pt's brief was soaked, but she was still able to urinate on toilet and cleanse self. She used B hands on bars to "pull up" to allow her to transfer with max A.  She leaned side to side on shower seat to have bottom cleansed. Pt tolerated therapy well with a few rest breaks. Pt sitting in w/c at end of session with call light and phone in reach.   OT Evaluation Precautions/Restrictions   Precautions Precautions: Fall Restrictions Weight Bearing Restrictions: Yes RLE Weight Bearing: Weight bearing as tolerated    Vital Signs Therapy Vitals Pulse Rate: 77 Oxygen Therapy SpO2: 97 % O2 Device: Nasal cannula O2 Flow Rate (L/min): 2 L/min Pain Pain Assessment Pain Assessment: 0-10 Pain Score: 0-No pain Faces Pain Scale: No hurt Pain Type: Surgical pain Pain Location: Hip Pain Orientation: Right Pain Descriptors / Indicators: Aching Pain Onset: Gradual Pain Intervention(s): RN made aware PAINAD (Pain Assessment in Advanced Dementia) Breathing: normal Critical Care Pain Observation Tool (CPOT) Facial Expression: Relaxed, neutral Home Living/Prior Functioning Home Living Family/patient expects to be discharged to:: Private residence Living Arrangements: Children Available Help at Discharge: Family;Available PRN/intermittently (AM only) Type of Home: House Home Access: Stairs to enter;Ramped entrance Entrance Stairs-Number of Steps: 2  (side entry to lower level- no BR; front entrance to split level is ramped) Entrance Stairs-Rails: None Additional Comments: Pt lived with dtr and infant grandson in a split foyer home. 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 ADL  refer to FIM Vision/Perception  Vision - History Baseline Vision: Wears glasses all the time Patient Visual Report: Other (comment) (visual hallucinations) Vision - Assessment Eye Alignment: Within Functional Limits Vision Assessment: Vision not tested Perception Perception: Within Functional Limits Praxis Praxis: Intact  Cognition Overall Cognitive Status: Impaired/Different from baseline Arousal/Alertness: Awake/alert Orientation  Level: Oriented X4 Problem Solving: Impaired Problem Solving Impairment: Functional basic;Other (comment) (delayed processing) Sensation Sensation Light Touch:  Appears Intact Stereognosis: Appears Intact Hot/Cold: Appears Intact Proprioception: Appears Intact Coordination Fine Motor Movements are Fluid and Coordinated: Yes Motor  Motor Motor: Within Functional Limits Motor - Skilled Clinical Observations: generalized weakness Mobility  Bed Mobility Bed Mobility: Rolling Right;Right Sidelying to Sit;Sitting - Scoot to Edge of Bed (HOB elevated) Rolling Right: 3: Mod assist Rolling Right Details: Verbal cues for technique Right Sidelying to Sit Details: Manual facilitation for weight shifting;Verbal cues for technique Supine to Sit: 2: Max assist Sitting - Scoot to Edge of Bed: 2: Max assist Sitting - Scoot to [employer] of Bed Details: Manual facilitation for weight shifting;Verbal cues for technique Transfers Sit to Stand: 2: Max assist Sit to Stand Details: Verbal cues for technique;Verbal cues for precautions/safety;Tactile cues for weight shifting;Manual facilitation for weight shifting Stand to Sit: 1: +1 Total assist (uncontrolled descend suddenly when L knee buckled)  Trunk/Postural Assessment  Cervical Assessment Cervical Assessment: Within Functional Limits Thoracic Assessment Thoracic Assessment: Within Functional Limits Lumbar Assessment Lumbar Assessment: Exceptions to WFL (decreased wt bearing R hip) Postural Control Postural Control: Deficits on evaluation Postural Limitations: trunk flexion  Balance Balance Balance Assessed: Yes Static Sitting Balance Static Sitting - Level of Assistance: 5: Stand by assistance Static Standing Balance Static Standing - Level of Assistance: 2: Max assist Dynamic Standing Balance Dynamic Standing - Level of Assistance: Not tested (comment) Extremity/Trunk Assessment RUE Assessment RUE Assessment: Within Functional Limits LUE  Assessment LUE Assessment: Within Functional Limits  FIM:  FIM - Grooming Grooming Steps: Wash, rinse, dry face FIM - Bathing Bathing Steps Patient Completed: Chest;Right Arm;Left Arm;Abdomen;Front perineal area;Right upper leg;Left upper leg Bathing: 3: Mod-Patient completes 5-7 [id number] parts or 50-74% FIM - Upper Body Dressing/Undressing Upper body dressing/undressing: 0: Wears gown/pajamas-no public clothing FIM - Lower Body Dressing/Undressing Lower body dressing/undressing: 0: Wears gown/pajamas-no public clothing FIM - Toileting Toileting steps completed by patient: Performs perineal hygiene Toileting: 2: Max-Patient completed 1 of 3 steps FIM - TRadio producerDevices: Grab bars Toilet Transfers: 2-To toilet/BSC: Max A (lift and lower assist);2-From toilet/BSC: Max A (lift and lower assist) FIM - TSystems developerDevices: Shower chair;Grab bars Tub/shower Transfers: 2-Out of Tub/Shower: Max A (Lift and lower assist);2-Into Tub/Shower: Max A (lift and lower assist)   Refer to Care Plan for Long Term Goals  Recommendations for other services: None  Discharge Criteria: Patient will be discharged from OT if patient refuses treatment 3 consecutive times without medical reason, if treatment goals not met, if there is a change in medical status, if patient makes no progress towards goals or if patient is discharged from hospital.  The above assessment, treatment plan, treatment alternatives and goals were discussed and mutually agreed upon: by patient  [facility]1/14/2015, 12:02 PM

## 2013-06-30 NOTE — Progress Notes (Signed)
Subjective/Complaints: 63 y.o. female with history of alcohol abuse, depression with anxiety, smoker who fell on 06/18/13 with onset of pain and difficulty wight bearing on RLE and inability to walk. She was admitted on 06/21/13 for work up. sje was found to have right comminuted IT hip fracture as well as 7.2 x 5.0 x 6.4 cm necrotic right upper lobe mass (most consistent with primary lung malignancy) locally invasive to anterior mediastinum as well as enlarged right hilar node with smaller pericarnal nodes and additional sub-centimeter RUL pulmonary nodules. She underwent right hip IM nail fixation by Dr. Percell Miller as well as bronchoscopy with tracheobronchial needle aspiration of R-paratracheal mass. Bone biopsy of right hip negative for cancer. Cytology positive for non-small cell carcinoma. Dr. Humphrey Rolls consulted for input and recommends full workup and chemotherapy--patient not a surgical candidate. Final decision to be made after recovery and d/c to home.   Pt reports seeing flies buzzing around me.  States this started after fall.  States last drink was New year's half a beer, last "heavy drinking" 2002  Objective: Vital Signs: Blood pressure 152/88, pulse 75, temperature 97.5 F (36.4 C), temperature source Oral, resp. rate 19, height 5' 3"  (1.6 m), weight 66.3 kg (146 lb 2.6 oz), SpO2 95.00%. No results found. Results for orders placed during the hospital encounter of 06/29/13 (from the past 72 hour(s))  CBC WITH DIFFERENTIAL     Status: Abnormal   Collection Time    06/30/13  4:52 AM      Result Value Range   WBC 16.4 (*) 4.0 - 10.5 K/uL   RBC 3.01 (*) 3.87 - 5.11 MIL/uL   Hemoglobin 10.1 (*) 12.0 - 15.0 g/dL   HCT 29.7 (*) 36.0 - 46.0 %   MCV 98.7  78.0 - 100.0 fL   MCH 33.6  26.0 - 34.0 pg   MCHC 34.0  30.0 - 36.0 g/dL   RDW 14.4  11.5 - 15.5 %   Platelets 478 (*) 150 - 400 K/uL   Neutrophils Relative % 81 (*) 43 - 77 %   Neutro Abs 13.3 (*) 1.7 - 7.7 K/uL   Lymphocytes Relative 8  (*) 12 - 46 %   Lymphs Abs 1.2  0.7 - 4.0 K/uL   Monocytes Relative 10  3 - 12 %   Monocytes Absolute 1.6 (*) 0.1 - 1.0 K/uL   Eosinophils Relative 2  0 - 5 %   Eosinophils Absolute 0.3  0.0 - 0.7 K/uL   Basophils Relative 0  0 - 1 %   Basophils Absolute 0.0  0.0 - 0.1 K/uL  COMPREHENSIVE METABOLIC PANEL     Status: Abnormal   Collection Time    06/30/13  4:52 AM      Result Value Range   Sodium 132 (*) 137 - 147 mEq/L   Potassium 3.6 (*) 3.7 - 5.3 mEq/L   Chloride 89 (*) 96 - 112 mEq/L   CO2 35 (*) 19 - 32 mEq/L   Glucose, Bld 115 (*) 70 - 99 mg/dL   BUN 10  6 - 23 mg/dL   Creatinine, Ser 0.40 (*) 0.50 - 1.10 mg/dL   Calcium 8.8  8.4 - 10.5 mg/dL   Total Protein 6.1  6.0 - 8.3 g/dL   Albumin 2.5 (*) 3.5 - 5.2 g/dL   AST 37  0 - 37 U/L   ALT 46 (*) 0 - 35 U/L   Alkaline Phosphatase 162 (*) 39 - 117 U/L   Total Bilirubin 1.3 (*)  0.3 - 1.2 mg/dL   GFR calc non Af Amer >90  >90 mL/min   GFR calc Af Amer >90  >90 mL/min   Comment: (NOTE)     The eGFR has been calculated using the CKD EPI equation.     This calculation has not been validated in all clinical situations.     eGFR's persistently <90 mL/min signify possible Chronic Kidney     Disease.     HEENT: normal Cardio: irregular and no murmurs Resp: CTA B/L and unlabored GI: BS positive and non distended Extremity:  Pulses positive and No Edema Skin:   Intact and Wound C/D/I and ecchymosis around thigh lat right Neuro: Alert/Oriented, Flat, Cranial Nerve II-XII normal, Normal Sensory and Abnormal Motor 3-/5 R HF, KE, ankle DF and PF otherwise 4/5 throughout Musc/Skel:  Other Decreased AROM R hip Gen NAD   Assessment/Plan: 1. Functional deficits secondary to Right hip fracture after fall which require 3+ hours per day of interdisciplinary therapy in a comprehensive inpatient rehab setting. Physiatrist is providing close team supervision and 24 hour management of active medical problems listed below. Physiatrist and  rehab team continue to assess barriers to discharge/monitor patient progress toward functional and medical goals. FIM:                   Comprehension Comprehension Mode: Auditory Comprehension: 5-Follows basic conversation/direction: With extra time/assistive device  Expression Expression Mode: Verbal Expression: 5-Expresses basic needs/ideas: With extra time/assistive device  Social Interaction Social Interaction: 5-Interacts appropriately 90% of the time - Needs monitoring or encouragement for participation or interaction.  Problem Solving Problem Solving: 5-Solves basic problems: With no assist  Memory Memory: 5-Recognizes or recalls 90% of the time/requires cueing < 10% of the time  Medical Problem List and Plan:  1. DVT Prophylaxis/Anticoagulation: Discussed need of anticoagulation with Dr. Alverda Skeans v/s benefits of blood thinners. He recommends changing patient to eliquis (due to ETOH abuse hx) and to follow up in office for decision regarding need for long term anticoagulation.  2. Pain Management: Continue prn hydrocodone for now. Add tramadol additionally as needed.  3. H/o panic attacks/depression/Mood: Continue zoloft. Decrease ativan to 0.5 mg prn. LCSW to follow for evaluation.  4. Neuropsych: This patient is capable of making decisions on her own behalf.  5. Fluid overload: Still sounds wet--refused lasix today due to frequency. Check daily weights. Add low salt restrictions. Encourage medication compliance. Wean oxygen as able.  -follow daily I's and O's as well as weights  6. A fib with RVR: currently in NSR on amiodarone and lopressor. To decrease amiodarone to 200 mg bid starting 07/01/13 then 200 mg daily past one week. Start eliquis.  7. Non small-cell Lung Cancer: Discussion regarding treatment options past recovery per reports. To follow up with Dr. Humphrey Rolls past discharge.  8. Reactive Leucocytosis: Will recheck labs in am.  9. ABLA: Will add iron  supplement  10. Hypokalemia: Due to diuretics. Will add daily supplement.  11. Alcohol abuse: No withdrawal signs reported. No recent urine tox 12.  Hallucinations- CT head chronic microvascular changes will ask  Neuropsych to eval, possible pain meds, change to tramadol only  LOS (Days) 1 A FACE TO FACE EVALUATION WAS PERFORMED  Fe Okubo E 06/30/2013, 6:39 AM

## 2013-06-30 NOTE — Progress Notes (Signed)
Occupational Therapy Session Note  Patient Details  Name: Stephanie Oliver MRN: [id number] Date of Birth: [birth year]  Today's Date: 06/30/2013 Time: 1330-1400 Time Calculation (min): 30 min  Short Term Goals: Week 1:  OT Short Term Goal 1 (Week 1): Pt will be able to transfer to toilet with mod assist using a stand pivot transfer. OT Short Term Goal 2 (Week 1): Pt will be able to come into a stand from toilet and maintain standing for 1 min with min assist for LB clothing management. OT Short Term Goal 3 (Week 1): Pt will be able to don her pants with mod assist.  Skilled Therapeutic Interventions/Progress Updates:  Patient sleeping in bed with HOB up and lunch tray in front of her.  Patient must have fallen asleep in the middle of a bite secondary to her hand was in her salad, fork in plate next to hand and food still in her mouth.  Patient began talking to this OT as though we were friends/aquaintenences. Reoriented patient and she still insisted I was "Alyse Low" and thought I work with [employer] and my husband works at [employer].  Finally convinced patient that I was a therapist.  Engaged patient in grooming task at sink with focus on bed mobility, sit><stand, stand-step transfer bed>w/c, standing tolerance & balance at sink to brush teeth to include set up while standing.  Patient decided to stay up in w/c and all items within reach.  Therapy Documentation Precautions:  Precautions Precautions: Fall Restrictions Weight Bearing Restrictions: Yes RLE Weight Bearing: Weight bearing as tolerated Pain: Reports right hip pain  Therapy/Group: Individual Therapy  Kinte Trim 06/30/2013, 2:30 PM

## 2013-07-01 ENCOUNTER — Inpatient Hospital Stay (HOSPITAL_COMMUNITY): Admitting: Occupational Therapy

## 2013-07-01 ENCOUNTER — Inpatient Hospital Stay (HOSPITAL_COMMUNITY)

## 2013-07-01 LAB — CBC
HEMATOCRIT: 28.4 % — AB (ref 36.0–46.0)
Hemoglobin: 9.2 g/dL — ABNORMAL LOW (ref 12.0–15.0)
MCH: 32.7 pg (ref 26.0–34.0)
MCHC: 32.4 g/dL (ref 30.0–36.0)
MCV: 101.1 fL — AB (ref 78.0–100.0)
PLATELETS: 431 10*3/uL — AB (ref 150–400)
RBC: 2.81 MIL/uL — AB (ref 3.87–5.11)
RDW: 14.2 % (ref 11.5–15.5)
WBC: 16 10*3/uL — ABNORMAL HIGH (ref 4.0–10.5)

## 2013-07-01 NOTE — Progress Notes (Signed)
Subjective/Complaints: 63 y.o. female with history of alcohol abuse, depression with anxiety, smoker who fell on 06/18/13 with onset of pain and difficulty wight bearing on RLE and inability to walk. She was admitted on 06/21/13 for work up. sje was found to have right comminuted IT hip fracture as well as 7.2 x 5.0 x 6.4 cm necrotic right upper lobe mass (most consistent with primary lung malignancy) locally invasive to anterior mediastinum as well as enlarged right hilar node with smaller pericarnal nodes and additional sub-centimeter RUL pulmonary nodules. She underwent right hip IM nail fixation by Dr. Percell Miller as well as bronchoscopy with tracheobronchial needle aspiration of R-paratracheal mass. Bone biopsy of right hip negative for cancer. Cytology positive for non-small cell carcinoma. Dr. Humphrey Rolls consulted for input and recommends full workup and chemotherapy--patient not a surgical candidate. Final decision to be made after recovery and d/c to home.   No hallucinations Slept well  no SOB off O2 with sat 94% Discussed D/C date of 1/27  Objective: Vital Signs: Blood pressure 117/78, pulse 71, temperature 97.7 F (36.5 C), temperature source Oral, resp. rate 17, height 5' 3"  (1.6 m), weight 66 kg (145 lb 8.1 oz), SpO2 97.00%. No results found. Results for orders placed during the hospital encounter of 06/29/13 (from the past 72 hour(s))  CBC WITH DIFFERENTIAL     Status: Abnormal   Collection Time    06/30/13  4:52 AM      Result Value Range   WBC 16.4 (*) 4.0 - 10.5 K/uL   RBC 3.01 (*) 3.87 - 5.11 MIL/uL   Hemoglobin 10.1 (*) 12.0 - 15.0 g/dL   HCT 29.7 (*) 36.0 - 46.0 %   MCV 98.7  78.0 - 100.0 fL   MCH 33.6  26.0 - 34.0 pg   MCHC 34.0  30.0 - 36.0 g/dL   RDW 14.4  11.5 - 15.5 %   Platelets 478 (*) 150 - 400 K/uL   Neutrophils Relative % 81 (*) 43 - 77 %   Neutro Abs 13.3 (*) 1.7 - 7.7 K/uL   Lymphocytes Relative 8 (*) 12 - 46 %   Lymphs Abs 1.2  0.7 - 4.0 K/uL   Monocytes  Relative 10  3 - 12 %   Monocytes Absolute 1.6 (*) 0.1 - 1.0 K/uL   Eosinophils Relative 2  0 - 5 %   Eosinophils Absolute 0.3  0.0 - 0.7 K/uL   Basophils Relative 0  0 - 1 %   Basophils Absolute 0.0  0.0 - 0.1 K/uL  COMPREHENSIVE METABOLIC PANEL     Status: Abnormal   Collection Time    06/30/13  4:52 AM      Result Value Range   Sodium 132 (*) 137 - 147 mEq/L   Potassium 3.6 (*) 3.7 - 5.3 mEq/L   Chloride 89 (*) 96 - 112 mEq/L   CO2 35 (*) 19 - 32 mEq/L   Glucose, Bld 115 (*) 70 - 99 mg/dL   BUN 10  6 - 23 mg/dL   Creatinine, Ser 0.40 (*) 0.50 - 1.10 mg/dL   Calcium 8.8  8.4 - 10.5 mg/dL   Total Protein 6.1  6.0 - 8.3 g/dL   Albumin 2.5 (*) 3.5 - 5.2 g/dL   AST 37  0 - 37 U/L   ALT 46 (*) 0 - 35 U/L   Alkaline Phosphatase 162 (*) 39 - 117 U/L   Total Bilirubin 1.3 (*) 0.3 - 1.2 mg/dL   GFR calc non  Af Amer >90  >90 mL/min   GFR calc Af Amer >90  >90 mL/min   Comment: (NOTE)     The eGFR has been calculated using the CKD EPI equation.     This calculation has not been validated in all clinical situations.     eGFR's persistently <90 mL/min signify possible Chronic Kidney     Disease.  COMPREHENSIVE METABOLIC PANEL     Status: Abnormal   Collection Time    06/30/13 11:25 AM      Result Value Range   Sodium 133 (*) 137 - 147 mEq/L   Potassium 3.8  3.7 - 5.3 mEq/L   Chloride 90 (*) 96 - 112 mEq/L   CO2 32  19 - 32 mEq/L   Glucose, Bld 144 (*) 70 - 99 mg/dL   BUN 10  6 - 23 mg/dL   Creatinine, Ser 0.45 (*) 0.50 - 1.10 mg/dL   Calcium 8.8  8.4 - 10.5 mg/dL   Total Protein 5.9 (*) 6.0 - 8.3 g/dL   Albumin 2.3 (*) 3.5 - 5.2 g/dL   AST 31  0 - 37 U/L   ALT 43 (*) 0 - 35 U/L   Alkaline Phosphatase 159 (*) 39 - 117 U/L   Total Bilirubin 1.1  0.3 - 1.2 mg/dL   GFR calc non Af Amer >90  >90 mL/min   GFR calc Af Amer >90  >90 mL/min   Comment: (NOTE)     The eGFR has been calculated using the CKD EPI equation.     This calculation has not been validated in all clinical  situations.     eGFR's persistently <90 mL/min signify possible Chronic Kidney     Disease.  CBC     Status: Abnormal   Collection Time    07/01/13  4:40 AM      Result Value Range   WBC 16.0 (*) 4.0 - 10.5 K/uL   RBC 2.81 (*) 3.87 - 5.11 MIL/uL   Hemoglobin 9.2 (*) 12.0 - 15.0 g/dL   HCT 28.4 (*) 36.0 - 46.0 %   MCV 101.1 (*) 78.0 - 100.0 fL   MCH 32.7  26.0 - 34.0 pg   MCHC 32.4  30.0 - 36.0 g/dL   RDW 14.2  11.5 - 15.5 %   Platelets 431 (*) 150 - 400 K/uL     HEENT: normal Cardio: irregular and no murmurs Resp: CTA B/L and unlabored GI: BS positive and non distended Extremity:  Pulses positive and No Edema Skin:   Intact and Wound C/D/I and ecchymosis around thigh lat right Neuro: Alert/Oriented, Flat, Cranial Nerve II-XII normal, Normal Sensory and Abnormal Motor 3-/5 R HF, KE, ankle DF and PF otherwise 4/5 throughout Musc/Skel:  Other Decreased AROM R hip Gen NAD   Assessment/Plan: 1. Functional deficits secondary to Right hip fracture after fall which require 3+ hours per day of interdisciplinary therapy in a comprehensive inpatient rehab setting. Physiatrist is providing close team supervision and 24 hour management of active medical problems listed below. Physiatrist and rehab team continue to assess barriers to discharge/monitor patient progress toward functional and medical goals. FIM: FIM - Bathing Bathing Steps Patient Completed: Chest;Right Arm;Left Arm;Abdomen;Front perineal area;Right upper leg;Left upper leg Bathing: 3: Mod-Patient completes 5-7 33f10 parts or 50-74%  FIM - Upper Body Dressing/Undressing Upper body dressing/undressing: 0: Wears gown/pajamas-no public clothing FIM - Lower Body Dressing/Undressing Lower body dressing/undressing: 0: Wears gown/pajamas-no public clothing  FIM - Toileting Toileting steps completed by  patient: Performs perineal hygiene Toileting: 2: Max-Patient completed 1 of 3 steps  FIM - Engineer, structural Devices: Grab bars Toilet Transfers: 2-To toilet/BSC: Max A (lift and lower assist);2-From toilet/BSC: Max A (lift and lower assist)  FIM - Engineer, site Assistive Devices: HOB elevated Bed/Chair Transfer: 2: Bed > Chair or W/C: Max A (lift and lower assist)  FIM - Locomotion: Wheelchair Distance: 0 Locomotion: Wheelchair: 1: Total Assistance/staff pushes wheelchair (Pt<25%) FIM - Locomotion: Ambulation Locomotion: Ambulation Assistive Devices: Administrator Ambulation/Gait Assistance: 2: Max assist Locomotion: Ambulation: 1: Two helpers (2nd person to follow closely with w/c)  Comprehension Comprehension Mode: Auditory Comprehension: 5-Follows basic conversation/direction: With extra time/assistive device  Expression Expression Mode: Verbal Expression: 5-Expresses basic needs/ideas: With extra time/assistive device  Social Interaction Social Interaction: 5-Interacts appropriately 90% of the time - Needs monitoring or encouragement for participation or interaction.  Problem Solving Problem Solving: 5-Solves basic problems: With no assist  Memory Memory: 5-Recognizes or recalls 90% of the time/requires cueing < 10% of the time  Medical Problem List and Plan:  1. DVT Prophylaxis/Anticoagulation: Discussed need of anticoagulation with Dr. Alverda Skeans v/s benefits of blood thinners. He recommends changing patient to eliquis (due to ETOH abuse hx) and to follow up in office for decision regarding need for long term anticoagulation.  2. Pain Management: Continue prn hydrocodone for now. Add tramadol additionally as needed.  3. H/o panic attacks/depression/Mood: Continue zoloft. Decrease ativan to 0.5 mg prn. LCSW to follow for evaluation.  4. Neuropsych: This patient is capable of making decisions on her own behalf.  5. Fluid overload: Still sounds wet--refused lasix today due to frequency. Check daily weights. Add low salt restrictions.  Encourage medication compliance. Wean oxygen as able.  -follow daily I's and O's as well as weights  6. A fib with RVR: currently in NSR on amiodarone and lopressor. To decrease amiodarone to 200 mg bid starting 07/01/13 then 200 mg daily past one week. Start eliquis.  7. Non small-cell Lung Cancer: Discussion regarding treatment options past recovery per reports. To follow up with Dr. Humphrey Rolls past discharge. Will see if they will need PICC line 8. Reactive Leucocytosis: Will recheck labs in am.  9. ABLA: Will add iron supplement  10. Hypokalemia: Due to diuretics. Will add daily supplement.  11. Alcohol abuse: No withdrawal signs reported. No recent urine tox, pt reported to PA 4 "large scotch and waters" per day, but reported no"heavy"  ETOH to me since 2002 12.  Hallucinations- CT head chronic microvascular changes will ask  Neuropsych to eval, possible pain meds, change to tramadol only, seems to be helping  LOS (Days) 2 A FACE TO FACE EVALUATION WAS PERFORMED  Epic Tribbett E 07/01/2013, 9:35 AM

## 2013-07-01 NOTE — Progress Notes (Signed)
Physical Therapy Session Note  Patient Details  Name: Stephanie Oliver MRN: [id number] Date of Birth: Jun 03, 1951  Today's Date: 07/01/2013 Time:  -  8:15-9:00 (9min) 13:40-14:15 (83min)     Short Term Goals: Week 1:  PT Short Term Goal 1 (Week 1): Pt will perform bed mobility with min assist. PT Short Term Goal 2 (Week 1): Pt will perform bed>< w/c transfer with mod assist, 1 person. PT Short Term Goal 3 (Week 1): Pt will propel w/c x 25' supervision. PT Short Term Goal 4 (Week 1): Pt will lock w/c brakes with 1 cue. PT Short Term Goal 5 (Week 1): Pt will perform gait x 15' with assistance of 1 person.  Skilled Therapeutic Interventions/Progress Updates:  1:2 Tx focused on therex for strength and ROM, transfers, standing tolerance, and WC propulsion, and gait.  Supine and seated therex including each of the following x20:  -ankle pumps, quad sets, glute sets, SAQ, heel slides, and SLR (with assist); LAQ, marching  Supine>sit with Mod A and cues for technique Sit>stand with Mod A for lifting Stand-step transfer with Mod A with decreased weight shifting to R side, hobble-step  2:2 Tx focused on sitting and standing threxe for strength and ROM, gait in // bars, and stairs with 2 rails.  Supine>sit with mod A for trunk, transfers with mod A Staff pushed WC to gym; pt pushed x40' back to room with Min A for obstacle negotiation.  Standing therex including heel raises, R marching, weight shifting, and R hip ABD Gait 2x8' with Mod A, slow step-to pattern with decreased R weight shift Saits x1 up/back down with Mod A for weight shift steadying with sequencing cues.  Pt encouraged to stay up iN WC a while, call bell in reach, reminded not to get up unassisted.           Therapy Documentation Precautions:  Precautions Precautions: Fall Restrictions Weight Bearing Restrictions: Yes RLE Weight Bearing: Weight bearing as tolerated General:   Vital Signs: 02 >95 % on room air  throughout tx   Pain: 9/10 R hip, nursing aware and administered pain meds; modified tx prn    Other Treatments:    See FIM for current functional status  Therapy/Group: Individual Therapy  Kennieth Rad, PT, DPT   07/01/2013, 8:26 AM

## 2013-07-01 NOTE — IPOC Note (Signed)
Overall Plan of Care [facility]) Patient Details Name: Stephanie Oliver MRN: [id number] DOB: [birth year]  Admitting Diagnosis: R HIP FX  Hospital Problems: Active Problems:   Hip fracture requiring operative repair     Functional Problem List: Nursing Bladder;Bowel;Endurance;Motor;Pain;Safety;Skin Integrity  PT Balance;Behavior;Edema;Endurance;Motor;Pain  OT Balance;Cognition;Endurance;Pain  SLP    TR         Basic ADL's: OT Bathing;Dressing;Toileting     Advanced  ADL's: OT       Transfers: PT Bed Mobility;Bed to Chair;Car;Furniture  OT Toilet;Tub/Shower     Locomotion: PT Ambulation;Wheelchair Mobility;Stairs     Additional Impairments: OT None  SLP        TR      Anticipated Outcomes Item Anticipated Outcome  Self Feeding I  Swallowing      Basic self-care  min A with bathing in shower, min LB dressing for steadying assist  Toileting  min assist   Bathroom Transfers min assist  Bowel/Bladder  Min assist  Transfers  min assist for basic and car  Locomotion  min assist gait x 25'; min assist up/down 6 steps for home access to upper level with BR; supervision w/c x 50' home or controlled env; 24 hour supervision  Communication     Cognition     Pain  </=3  Safety/Judgment  No falls with injury   Therapy Plan: PT Intensity: Minimum of 1-2 x/day ,45 to 90 minutes PT Frequency: 5 out of 7 days PT Duration Estimated Length of Stay: 14-16 days OT Intensity: Minimum of 1-2 x/day, 45 to 90 minutes OT Frequency: 5 out of 7 days OT Duration/Estimated Length of Stay: 2 weeks         Team Interventions: Nursing Interventions Patient/Family Education;Bladder Management;Bowel Management;Pain Management;Skin Care/Wound Management;Psychosocial Support  PT interventions Ambulation/gait training;Balance/vestibular training;Cognitive remediation/compensation;Discharge planning;Neuromuscular re-education;Functional mobility training;DME/adaptive equipment  instruction;Pain management;Patient/family education;Psychosocial support;Splinting/orthotics;UE/LE Strength taining/ROM;UE/LE Coordination activities;Therapeutic Exercise;Therapeutic Activities;Stair training;Wheelchair propulsion/positioning  OT Interventions Cognitive remediation/compensation;Discharge planning;Balance/vestibular training;DME/adaptive equipment instruction;Functional mobility training;Pain management;Patient/family education;Self Care/advanced ADL retraining;Therapeutic Activities;UE/LE Strength taining/ROM;Therapeutic Exercise  SLP Interventions    TR Interventions    SW/CM Interventions Discharge Planning;Psychosocial Support;Patient/Family Education    Team Discharge Planning: Destination: PT-Home ,OT- Home , SLP-  Projected Follow-up: PT-Home health PT, OT-  Home health OT, SLP-  Projected Equipment Needs: PT-Rolling walker with 5" wheels;Wheelchair (measurements);Wheelchair cushion (measurements), OT- 3 in 1 bedside comode;Tub/shower bench, SLP-  Equipment Details: PT- , OT-  Patient/family involved in discharge planning: PT- Patient,  OT-Patient, SLP-   MD ELOS: 14-16 days Medical Rehab Prognosis:  Good Assessment: 63 y.o. female with history of alcohol abuse, depression with anxiety, smoker who fell on 06/18/13 with onset of pain and difficulty wight bearing on RLE and inability to walk. She was admitted on 06/21/13 for work up. sje was found to have right comminuted IT hip fracture as well as 7.2 x 5.0 x 6.4 cm necrotic right upper lobe mass (most consistent with primary lung malignancy) locally invasive to anterior mediastinum as well as enlarged right hilar node with smaller pericarnal nodes and additional sub-centimeter RUL pulmonary nodules. She underwent right hip IM nail fixation by Dr. Percell Miller as well as bronchoscopy with tracheobronchial needle aspiration of R-paratracheal mass.  Now requiring 24/7 Rehab RN,MD, as well as CIR level PT, OT and SLP.  Treatment team  will focus on ADLs and mobility with goals set at [facility]    See Team Conference Notes for weekly updates to the plan of care

## 2013-07-01 NOTE — Progress Notes (Signed)
Social Work Patient ID: Stephanie Oliver, female   DOB: 1951-02-05, 63 y.o.   MRN: [id number] Met with pt who is having a much better day today.  She reports: " Therapy is hard but it is worth it."  More alert and able to converse, her daughter coming to bring her clothes and Visit.  She reports her Mom is coming from [place] to visit today.  She voiced she is in better health than me.  Will work on discharge needs.

## 2013-07-01 NOTE — Progress Notes (Signed)
Social Work Assessment and Plan Social Work Assessment and Plan  Patient Details  Name: Stephanie Oliver MRN: [id number] Date of Birth: 1951-04-28  Today's Date: 07/01/2013  Problem List:  Patient Active Problem List   Diagnosis Date Noted  . Hip fracture requiring operative repair 06/29/2013  . PAF (paroxysmal atrial fibrillation) 06/24/2013  . Non-small cell cancer of right lung 06/24/2013  . Encephalopathy acute 06/23/2013  . Mediastinal lymphadenopathy 06/22/2013  . COPD (chronic obstructive pulmonary disease) 06/22/2013  . Tobacco use disorder 06/22/2013  . Intertrochanteric fracture of right hip 06/21/2013  . Alcohol abuse, continuous 06/21/2013  . Former smoker 06/21/2013  . Hypertension 06/21/2013  . Osteoporosis 06/21/2013  . Bipolar 1 disorder 06/21/2013  . Hip fracture 06/21/2013   Past Medical History:  Past Medical History  Diagnosis Date  . Depression   . Panic attacks   . Hypertension   . Smoker   . Alcohol abuse    Past Surgical History:  Past Surgical History  Procedure Laterality Date  . Tonsillectomy    . Back surgery    . Femur im nail Right 06/22/2013    Procedure: RIGHT HIP GAMMA NAIL FIXATION   ;  Surgeon: Renette Butters, MD;  Location: [place];  Service: Orthopedics;  Laterality: Right;  . Video bronchoscopy with endobronchial ultrasound N/A 06/22/2013    Procedure: VIDEO BRONCHOSCOPY WITH ENDOBRONCHIAL ULTRASOUND;  Surgeon: Doree Fudge, MD;  Location: [place];  Service: Pulmonary;  Laterality: N/A;   Social History:  reports that she has been smoking Cigarettes.  She has been smoking about 1.50 packs per day. She has never used smokeless tobacco. She reports that she drinks about 2.4 ounces of alcohol per week. She reports that she does not use illicit drugs.  Family / Support Systems Marital Status: Widow/Widower Patient Roles: Parent Children: [name]-daughter  [phone] Other Supports: Neighbors and friends Anticipated Caregiver:  Daughter Ability/Limitations of Caregiver: Tonette Bihari works from 2;30-1;00am daily Caregiver Availability: Other (Comment) (Need to come up with a plan) Family Dynamics: Close knit with her daughter, whom lives with her, along with daughter's 45 month old son-pt's grandson.  Pt cares for grandson while daughter works.  All are very close.  Social History Preferred language: English Religion: [place] Cultural Background: No issues Education: [employer] Read: Yes Write: Yes Employment Status: Retired Freight forwarder Issues: No issues Guardian/Conservator: None-according to MD pt is capable of making her own decisions while here.   Abuse/Neglect Physical Abuse: Denies Verbal Abuse: Denies Sexual Abuse: Denies Exploitation of patient/patient's resources: Denies Self-Neglect: Denies  Emotional Status Pt's affect, behavior adn adjustment status: Pt is  motivated to improve, she has pain with her hip, which limits her.  She has always taken care of herself and wants to get back to this.  She realizes she has other decisions to make once home regarding lung cancer and follow up treatment. Recent Psychosocial Issues: Other medical issues-recent cancer findings and more tests to follow once OP.  Care of 36 month old and her dogs until she is able to again.   Pyschiatric History: History of depression, pt reports she is doing well at this time and deos not feel a depression screen is warranted.  She has taken meds in the past and feels they were helpful.  If she feels they are needed again she will ask or discuss with her PCP.  Will monitor her coping while here. Substance Abuse History: Tobacco and ETOH, she admits to reducing her intake of  alcohol, but has said different things to different people.  Discussed resources available at discharge to follow up with.  Will continue to discuss while here  Patient / Family Perceptions, Expectations & Goals Pt/Family understanding of  illness & functional limitations: Pt and daughter have a basic understanding of her hip fracture, daughter does not know about pt's cancer diagnosis.  Pt states: " I need to tell her about this, but she will be upset."  Both are hopeful pt will do well here and get to the level she can manage at home alone while daughter works. 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, Grandmother, Building control surveyor, Retiree, Home Owner, etc Anticipated changes in roles/activities/participation: resume Pt/family expectations/goals: Pt states; ' I need to get around on my own at discharge."  [name] states: " I need her to be safe at home, so I am not so worried about her there when I am working."  [employer]: None Premorbid Home Care/DME Agencies: None Transportation available at discharge: Daughter Resource referrals recommended: Support group (specify) (Cancer Support Group & ETOH resources)  Discharge Planning Living Arrangements: Children Support Systems: Children;Friends/neighbors;Parent Type of Residence: Private residence Insurance Resources: Multimedia programmer (specify) ([employer]) Financial Resources: Social Security Financial Screen Referred: No Living Expenses: Own Money Management: Patient Does the patient have any problems obtaining your medications?: No Home Management: Both she and daughter Patient/Family Preliminary Plans: Return home with daughter who is there in the am and late night.  Both need to come up with a plan in case pt requires assistance at discharge from here.  She will not be walking her dogs or taking care of her grandson at [employer] from here.  Unless does much better and good knee stops buckling. Social Work Anticipated Follow Up Needs: HH/OP;Support Group;SNF DC Planning Additional Notes/Comments: Pt is very hard headed and does not use the best judgement, so will try to come up with a safe discharge plan.  Clinical Impression Alert, pleasant  female who is willing to work and actually glad she came to rehab now.  She may require care at discharge and currently does not have a plan for this.  She feels she can manage  At home whatever level she is.  Will get daughter involved and work on a safe discharge plan.  [name] 07/01/2013, 12:40 PM

## 2013-07-01 NOTE — Discharge Instructions (Addendum)
Inpatient Rehab Discharge Instructions  Dorinda Stehr Discharge date and time: 07/13/13   Activities/Precautions/ Functional Status: Activity: activity as tolerated with supervision Diet: regular diet Wound Care: keep wound clean and dry  Functional status:  ___ No restrictions     ___ Walk up steps independently _X__ 24/7 supervision/assistance   ___ Walk up steps with assistance ___ Intermittent supervision/assistance  ___ Bathe/dress independently _X__ Walk with walker    __X_ Bathe/dress with assistance ___ Walk Independently    ___ Shower independently ___ Walk with assistance    ___ Shower with assistance __X_ No alcohol     ___ Return to work/school ________  Special Instructions:    COMMUNITY REFERRALS UPON DISCHARGE:    Home Health:   PT, OT, RN, [place] ZYYQM:[phone] Date of last service:07/13/2013    Medical Equipment/Items Ordered:ROLLING WALKER, BSC, TUB BENCH  Agency/Supplier:[facility]   [phone] OTHER;  COMFORT KEEPERS-PRIVATE DUTY FOR TWO WEEKS AT 24 HR  GENERAL COMMUNITY RESOURCES FOR PATIENT/FAMILY: Support Groups:[facility]ANCER SUPPORT GROUP   My questions have been answered and I understand these instructions. I will adhere to these goals and the provided educational materials after my discharge from the hospital.  Patient/Caregiver Signature _______________________________ Date __________  Clinician Signature _______________________________________ Date __________  Please bring this form and your medication list with you to all your follow-up doctor's appointments.    Information on my medicine - ELIQUIS (apixaban)  This medication education was reviewed with me or my healthcare representative as part of my discharge preparation.  The pharmacist that spoke with me during my hospital stay was:  Manley Mason, [facility]  Why was Eliquis prescribed for you? Eliquis was prescribed for you to reduce the risk of forming blood  clots that can cause a stroke if you have a medical condition called atrial fibrillation (a type of irregular heartbeat) OR to reduce the risk of a blood clots forming after orthopedic surgery.  What do You need to know about Eliquis ? Take your Eliquis  5mg  TWICE DAILY - one tablet in the morning and one tablet in the evening with or without food.  It would be best to take the doses about the same time each day.  If you have difficulty swallowing the tablet whole please discuss with your pharmacist how to take the medication safely.  Take Eliquis exactly as prescribed by your doctor and DO NOT stop taking Eliquis without talking to the doctor who prescribed the medication.  Stopping may increase your risk of developing a new clot or stroke.  Refill your prescription before you run out.  After discharge, you should have regular check-up appointments with your healthcare provider that is prescribing your Eliquis.  In the future your dose may need to be changed if your kidney function or weight changes by a significant amount or as you get older.  What do you do if you miss a dose? If you miss a dose, take it as soon as you remember on the same day and resume taking twice daily.  Do not take more than one dose of ELIQUIS at the same time.  Important Safety Information A possible side effect of Eliquis is bleeding. You should call your healthcare provider right away if you experience any of the following:   Bleeding from an injury or your nose that does not stop.   Unusual colored urine (red or dark Bagg) or unusual colored stools (red or black).   Unusual bruising for  unknown reasons.   A serious fall or if you hit your head (even if there is no bleeding).  Some medicines may interact with Eliquis and might increase your risk of bleeding or clotting while on Eliquis. To help avoid this, consult your healthcare provider or pharmacist prior to using any new prescription or non-prescription  medications, including herbals, vitamins, non-steroidal anti-inflammatory drugs (NSAIDs) and supplements.  This website has more information on Eliquis (apixaban): [email or web address].

## 2013-07-01 NOTE — Progress Notes (Signed)
Occupational Therapy Session Note  Patient Details  Name: Stephanie Oliver MRN: [id number] Date of Birth: Feb 03, 1951  Today's Date: 07/01/2013 Time: 0900-1000 and 9432-7614 Time Calculation (min): 60 min and 45 min  Short Term Goals: Week 1:  OT Short Term Goal 1 (Week 1): Pt will be able to transfer to toilet with mod assist using a stand pivot transfer. OT Short Term Goal 2 (Week 1): Pt will be able to come into a stand from toilet and maintain standing for 1 min with min assist for LB clothing management. OT Short Term Goal 3 (Week 1): Pt will be able to don her pants with mod assist.  Skilled Therapeutic Interventions/Progress Updates:    Visit 1: Pain: 9/10 right hip. Informed nurse.  1:1 Pt seen for BADL retraining with a focus on sit to stand and standing tolerance. Pt was on 1L of O2 at 98%.  Asked physician for clearance to begin weaning her off O2.   During self care session pt was able to complete bathing and dressing maintaining an O2 level of 94-95%.  She demonstrated great improvement with her mobility of standing and transfers. Pt required less assistance and had improved control of her legs.  Pt was pleased with her progress and ability to work without the O2. Pt resting in w/c at end of session with call light in reach.   Visit 2: Pain: 9/10 right hip with activity. Rested and repositioned in w/c.  1:1: Pt seen this session to focus on standing tolerance and general activity tolerance.  Pt's O2 sats were 96% on room air in sitting, but did drop to 88% after standing for 3 minutes.  She was able to resume >95% very quickly.  Pt worked on a domino game in standing several times and was able to come into a full stand with min A.  She worked on UE strength with 2 lb dowel bar with no change in vital signs, but her O2 levels did drop again after 8-10 chair pushups. Recommended to pt, her physical therapist, and nurse that she use O2 during her PT sessions but could try room air when resting  in the room. Nurse aware to check her O2 levels.  Pt was tired but felt well after therapy. Pt resting in w/c with call light in reach.  Therapy Documentation Precautions:  Precautions Precautions: Fall Restrictions Weight Bearing Restrictions: Yes RLE Weight Bearing: Weight bearing as tolerated  Vital Signs: Therapy Vitals Pulse Rate: 71 BP: 117/78 mmHg Oxygen Therapy SpO2: 96 % O2 Device: None (Room air) Pulse Oximetry Type: Intermittent ADL:  See FIM for current functional status  Therapy/Group: Individual Therapy  SAGUIER,JULIA 07/01/2013, 11:39 AM

## 2013-07-02 ENCOUNTER — Inpatient Hospital Stay (HOSPITAL_COMMUNITY): Admitting: Physical Therapy

## 2013-07-02 ENCOUNTER — Inpatient Hospital Stay (HOSPITAL_COMMUNITY): Admitting: Occupational Therapy

## 2013-07-02 ENCOUNTER — Inpatient Hospital Stay (HOSPITAL_COMMUNITY): Payer: No Typology Code available for payment source | Admitting: Physical Therapy

## 2013-07-02 DIAGNOSIS — C3490 Malignant neoplasm of unspecified part of unspecified bronchus or lung: Secondary | ICD-10-CM

## 2013-07-02 DIAGNOSIS — S72143A Displaced intertrochanteric fracture of unspecified femur, initial encounter for closed fracture: Secondary | ICD-10-CM

## 2013-07-02 DIAGNOSIS — W19XXXA Unspecified fall, initial encounter: Secondary | ICD-10-CM

## 2013-07-02 LAB — CBC
HEMATOCRIT: 29.7 % — AB (ref 36.0–46.0)
HEMOGLOBIN: 10 g/dL — AB (ref 12.0–15.0)
MCH: 33.2 pg (ref 26.0–34.0)
MCHC: 33.7 g/dL (ref 30.0–36.0)
MCV: 98.7 fL (ref 78.0–100.0)
Platelets: 458 10*3/uL — ABNORMAL HIGH (ref 150–400)
RBC: 3.01 MIL/uL — ABNORMAL LOW (ref 3.87–5.11)
RDW: 14.2 % (ref 11.5–15.5)
WBC: 16.4 10*3/uL — ABNORMAL HIGH (ref 4.0–10.5)

## 2013-07-02 LAB — CLOSTRIDIUM DIFFICILE BY PCR: CDIFFPCR: NEGATIVE

## 2013-07-02 MED ORDER — ALTEPLASE 2 MG IJ SOLR: 2.0000 mg | Freq: Once | INTRAMUSCULAR | Status: DC

## 2013-07-02 MED ORDER — ALTEPLASE 2 MG IJ SOLR
4.0000 mg | Freq: Once | INTRAMUSCULAR | Status: AC
  Administered 2013-07-02: 4 mg
  Filled 2013-07-02: qty 4

## 2013-07-02 NOTE — Progress Notes (Signed)
Subjective/Complaints: 63 y.o. female with history of alcohol abuse, depression with anxiety, smoker who fell on 06/18/13 with onset of pain and difficulty wight bearing on RLE and inability to walk. She was admitted on 06/21/13 for work up. sje was found to have right comminuted IT hip fracture as well as 7.2 x 5.0 x 6.4 cm necrotic right upper lobe mass (most consistent with primary lung malignancy) locally invasive to anterior mediastinum as well as enlarged right hilar node with smaller pericarnal nodes and additional sub-centimeter RUL pulmonary nodules. She underwent right hip IM nail fixation by Dr. Percell Miller as well as bronchoscopy with tracheobronchial needle aspiration of R-paratracheal mass. Bone biopsy of right hip negative for cancer. Cytology positive for non-small cell carcinoma. Dr. Humphrey Rolls consulted for input and recommends full workup and chemotherapy--patient not a surgical candidate. Final decision to be made after recovery and d/c to home.   No hallucinations Slept poorly, mother visited yesterday  no SOB off O2 with sat 94% Review of Systems - Negative except cough, anxiety  Objective: Vital Signs: Blood pressure 164/58, pulse 71, temperature 98 F (36.7 C), temperature source Oral, resp. rate 20, height _0  (1.6 m), weight 66 kg (145 lb 8.1 oz), SpO2 99.00%. No results found. Results for orders placed during the hospital encounter of 06/29/13 (from the past 72 hour(s))  CBC WITH DIFFERENTIAL     Status: Abnormal   Collection Time    06/30/13  4:52 AM      Result Value Range   WBC 16.4 (*) 4.0 - 10.5 K/uL   RBC 3.01 (*) 3.87 - 5.11 MIL/uL   Hemoglobin 10.1 (*) 12.0 - 15.0 g/dL   HCT 29.7 (*) 36.0 - 46.0 %   MCV 98.7  78.0 - 100.0 fL   MCH 33.6  26.0 - 34.0 pg   MCHC 34.0  30.0 - 36.0 g/dL   RDW 14.4  11.5 - 15.5 %   Platelets 478 (*) 150 - 400 K/uL   Neutrophils Relative % 81 (*) 43 - 77 %   Neutro Abs 13.3 (*) 1.7 - 7.7 K/uL   Lymphocytes Relative 8 (*) 12 - 46 %    Lymphs Abs 1.2  0.7 - 4.0 K/uL   Monocytes Relative 10  3 - 12 %   Monocytes Absolute 1.6 (*) 0.1 - 1.0 K/uL   Eosinophils Relative 2  0 - 5 %   Eosinophils Absolute 0.3  0.0 - 0.7 K/uL   Basophils Relative 0  0 - 1 %   Basophils Absolute 0.0  0.0 - 0.1 K/uL  COMPREHENSIVE METABOLIC PANEL     Status: Abnormal   Collection Time    06/30/13  4:52 AM      Result Value Range   Sodium 132 (*) 137 - 147 mEq/L   Potassium 3.6 (*) 3.7 - 5.3 mEq/L   Chloride 89 (*) 96 - 112 mEq/L   CO2 35 (*) 19 - 32 mEq/L   Glucose, Bld 115 (*) 70 - 99 mg/dL   BUN 10  6 - 23 mg/dL   Creatinine, Ser 0.40 (*) 0.50 - 1.10 mg/dL   Calcium 8.8  8.4 - 10.5 mg/dL   Total Protein 6.1  6.0 - 8.3 g/dL   Albumin 2.5 (*) 3.5 - 5.2 g/dL   AST 37  0 - 37 U/L   ALT 46 (*) 0 - 35 U/L   Alkaline Phosphatase 162 (*) 39 - 117 U/L   Total Bilirubin 1.3 (*) 0.3 - 1.2  mg/dL   GFR calc non Af Amer >90  >90 mL/min   GFR calc Af Amer >90  >90 mL/min   Comment: (NOTE)     The eGFR has been calculated using the CKD EPI equation.     This calculation has not been validated in all clinical situations.     eGFR's persistently <90 mL/min signify possible Chronic Kidney     Disease.  COMPREHENSIVE METABOLIC PANEL     Status: Abnormal   Collection Time    06/30/13 11:25 AM      Result Value Range   Sodium 133 (*) 137 - 147 mEq/L   Potassium 3.8  3.7 - 5.3 mEq/L   Chloride 90 (*) 96 - 112 mEq/L   CO2 32  19 - 32 mEq/L   Glucose, Bld 144 (*) 70 - 99 mg/dL   BUN 10  6 - 23 mg/dL   Creatinine, Ser 0.45 (*) 0.50 - 1.10 mg/dL   Calcium 8.8  8.4 - 10.5 mg/dL   Total Protein 5.9 (*) 6.0 - 8.3 g/dL   Albumin 2.3 (*) 3.5 - 5.2 g/dL   AST 31  0 - 37 U/L   ALT 43 (*) 0 - 35 U/L   Alkaline Phosphatase 159 (*) 39 - 117 U/L   Total Bilirubin 1.1  0.3 - 1.2 mg/dL   GFR calc non Af Amer >90  >90 mL/min   GFR calc Af Amer >90  >90 mL/min   Comment: (NOTE)     The eGFR has been calculated using the CKD EPI equation.     This calculation  has not been validated in all clinical situations.     eGFR's persistently <90 mL/min signify possible Chronic Kidney     Disease.  CBC     Status: Abnormal   Collection Time    07/01/13  4:40 AM      Result Value Range   WBC 16.0 (*) 4.0 - 10.5 K/uL   RBC 2.81 (*) 3.87 - 5.11 MIL/uL   Hemoglobin 9.2 (*) 12.0 - 15.0 g/dL   HCT 28.4 (*) 36.0 - 46.0 %   MCV 101.1 (*) 78.0 - 100.0 fL   MCH 32.7  26.0 - 34.0 pg   MCHC 32.4  30.0 - 36.0 g/dL   RDW 14.2  11.5 - 15.5 %   Platelets 431 (*) 150 - 400 K/uL     HEENT: normal Cardio: irregular and no murmurs Resp: CTA B/L and unlabored GI: BS positive and non distended Extremity:  Pulses positive and No Edema Skin:   Intact and Wound C/D/I and ecchymosis around thigh lat right Neuro: Alert/Oriented, Flat, Cranial Nerve II-XII normal, Normal Sensory and Abnormal Motor 3-/5 R HF, KE, ankle DF and PF otherwise 4/5 throughout Musc/Skel:  Other Decreased AROM R hip Gen NAD   Assessment/Plan: 1. Functional deficits secondary to Right hip fracture after fall which require 3+ hours per day of interdisciplinary therapy in a comprehensive inpatient rehab setting. Physiatrist is providing close team supervision and 24 hour management of active medical problems listed below. Physiatrist and rehab team continue to assess barriers to discharge/monitor patient progress toward functional and medical goals. FIM: FIM - Bathing Bathing Steps Patient Completed: Chest;Right Arm;Left Arm;Abdomen;Front perineal area;Right upper leg;Left upper leg;Buttocks;Left lower leg (including foot) Bathing: 4: Min-Patient completes 8-9 7f10 parts or 75+ percent  FIM - Upper Body Dressing/Undressing Upper body dressing/undressing: 0: Wears gown/pajamas-no public clothing FIM - Lower Body Dressing/Undressing Lower body dressing/undressing: 0: Wears  gown/pajamas-no public clothing  FIM - Toileting Toileting steps completed by patient: Performs perineal  hygiene Toileting: 2: Max-Patient completed 1 of 3 steps  FIM - Radio producer Devices: Grab bars Toilet Transfers: 2-To toilet/BSC: Max A (lift and lower assist);2-From toilet/BSC: Max A (lift and lower assist)  FIM - Engineer, site Assistive Devices: Arm rests Bed/Chair Transfer: 3: Supine > Sit: Mod A (lifting assist/Pt. 50-74%/lift 2 legs;3: Bed > Chair or W/C: Mod A (lift or lower assist)  FIM - Locomotion: Wheelchair Distance: 40 Locomotion: Wheelchair: 1: Travels less than 50 ft with minimal assistance (Pt.>75%) FIM - Locomotion: Ambulation Locomotion: Ambulation Assistive Devices: Parallel bars Ambulation/Gait Assistance: 3: Mod assist Locomotion: Ambulation: 1: Travels less than 50 ft with moderate assistance (Pt: 50 - 74%)  Comprehension Comprehension Mode: Auditory Comprehension: 5-Follows basic conversation/direction: With extra time/assistive device  Expression Expression Mode: Verbal Expression: 5-Expresses basic needs/ideas: With extra time/assistive device  Social Interaction Social Interaction: 5-Interacts appropriately 90% of the time - Needs monitoring or encouragement for participation or interaction.  Problem Solving Problem Solving: 5-Solves basic problems: With no assist  Memory Memory: 5-Recognizes or recalls 90% of the time/requires cueing < 10% of the time  Medical Problem List and Plan:  1. DVT Prophylaxis/Anticoagulation: Discussed need of anticoagulation with Dr. Alverda Skeans v/s benefits of blood thinners. He recommends changing patient to eliquis (due to ETOH abuse hx) and to follow up in office for decision regarding need for long term anticoagulation.  2. Pain Management: Continue prn hydrocodone for now. Add tramadol additionally as needed.  3. H/o panic attacks/depression/Mood: Continue zoloft. Decrease ativan to 0.5 mg prn. LCSW to follow for evaluation.  4. Neuropsych: This patient is  capable of making decisions on her own behalf.  5. Fluid overload: Still sounds wet--refused lasix today due to frequency. Check daily weights. Add low salt restrictions. Encourage medication compliance. Wean oxygen as able.  -follow daily I's and O's as well as weights  6. A fib with RVR: currently in NSR on amiodarone and lopressor. To decrease amiodarone to 200 mg bid starting 07/01/13 then 200 mg daily past one week. Start eliquis.  7. Non small-cell Lung Cancer: Discussion regarding treatment options past recovery per reports. To follow up with Dr. Humphrey Rolls past discharge. Will see if they will need PICC line 8. Reactive Leucocytosis: Will monitor labs and fever curve 9. ABLA: Will add iron supplement  10. Hypokalemia: Due to diuretics. Will add daily supplement.  11. Alcohol abuse: No withdrawal signs reported. No recent urine tox, pt reported to PA 4 "large scotch and waters" per day, but reported no"heavy"  ETOH to me since 2002 12.  Hallucinations- CT head chronic microvascular changes will ask  Neuropsych to eval, possible pain meds, change to tramadol only,no further c/os  LOS (Days) 3 A FACE TO FACE EVALUATION WAS PERFORMED  Admir Candelas E 07/02/2013, 6:08 AM

## 2013-07-02 NOTE — Progress Notes (Addendum)
Physical Therapy Note  Patient Details  Name: Stephanie Oliver MRN: [id number] Date of Birth: 1950/07/06 Today's Date: 07/02/2013  Time: 1100-1200, 60 min Patient received sitting in wheelchair. No c/o pain. Group therapy session with emphasis on LE therex, sit<>stand transfers x5 (minA). LE therex in sitting: hip flexion, add, knee ext, ankle DF/PF, x25 each LE. Patient left seated in wheelchair with all needs within reach.   Henderson Adwoa Axe, PT, DPT 07/02/2013, 12:10 PM

## 2013-07-02 NOTE — Progress Notes (Signed)
Social Work Patient ID: Stephanie Oliver, female   DOB: 1950/11/15, 63 y.o.   MRN: [id number] Met with pt's Mother-Stephanie Oliver who has expressed concerns about pt going home or whether she can go home.  Discussed she will not be able to take her dogs outside or Provide care to her 87 month old grandson due to she may not be able to ambulate independently on her own. She plans to discuss with daughter/pt and also have her son/pt's Brother talk with her.  Discussed options of NHP which am unsure if pt';s insurance will cover or hired assist.  Stephanie Oliver is focusing on the chemo treatments and the impact on  Pt.   She is aware of pt's refusal to go to NH on acute and she came here instead.  Will work with both on a safe discharge plan, but Stephanie Oliver is also aware pt needs to agree to plan.

## 2013-07-02 NOTE — Progress Notes (Signed)
Occupational Therapy Session Note  Patient Details  Name: Mirabella Hilario MRN: [id number] Date of Birth: [birth year]  Today's Date: 07/02/2013 Time: 1000-1100 Time Calculation (min): 60 min  Short Term Goals: Week 1:  OT Short Term Goal 1 (Week 1): Pt will be able to transfer to toilet with mod assist using a stand pivot transfer. OT Short Term Goal 2 (Week 1): Pt will be able to come into a stand from toilet and maintain standing for 1 min with min assist for LB clothing management. OT Short Term Goal 3 (Week 1): Pt will be able to don her pants with mod assist.  Skilled Therapeutic Interventions/Progress Updates:      Pt seen for BADL retraining of toileting, bathing, and dressing with a focus on activity tolerance, sit to stand, and standing balance.  Pt engaged in grooming tasks at the sink.  Pt then transferred to shower chair using grab bars with only min A, she also stood with only min A to wash bottom. She was able to don prefastened brief and pants over feet, but needed mod A support in standing to allow her to pull her pants up.  Pt provided with a RW, then was able to perform task with min A support. Pt had assist to don sock and was taken to main gym to start her PT session.  Therapy Documentation Precautions:  Precautions Precautions: Fall Restrictions Weight Bearing Restrictions: Yes RLE Weight Bearing: Weight bearing as tolerated   Vital Signs: Oxygen Therapy SpO2: 94 % O2 Device: None (Room air) Pain: Pain Assessment Pain Assessment: 0-10 Pain Score: 8  Pain Location: Hip Pain Orientation: Right Pain Descriptors / Indicators: Aching Patients Stated Pain Goal: 6 Pain Intervention(s): Medication (See eMAR) ADL:  See FIM for current functional status  Therapy/Group: Individual Therapy  SAGUIER,JULIA 07/02/2013, 11:41 AM

## 2013-07-02 NOTE — Progress Notes (Signed)
Physical Therapy Note  Patient Details  Name: Stephanie Oliver MRN: [id number] Date of Birth: [birth year] Today's Date: 07/02/2013  Patient missed 30 minutes of skilled physical therapy this PM secondary to refusal to participate, stating "I have to make phone calls to deal with bills right now." Encouraged patient that only a short 30 min session is scheduled, but patient adamant about "handling the bills situation." Patient left seated in wheelchair with all needs within reach. Will follow up as able.   Amber Vincenzo Stave, PT, DPT 07/02/2013, 1:53 PM

## 2013-07-02 NOTE — Plan of Care (Signed)
Problem: RH BLADDER ELIMINATION Goal: RH STG MANAGE BLADDER WITH ASSISTANCE STG Manage Bladder With mod Assistance  Outcome: Not Progressing Voids every hour incontinently and continent, no urine retention noted

## 2013-07-02 NOTE — Progress Notes (Signed)
Occupational Therapy Session Note  Patient Details  Name: Stephanie Oliver MRN: [id number] Date of Birth: 10-18-1950  Today's Date: 07/02/2013 Time: 1330-1400 Time Calculation (min): 30 min  Short Term Goals: Week 1:  OT Short Term Goal 1 (Week 1): Pt will be able to transfer to toilet with mod assist using a stand pivot transfer. OT Short Term Goal 2 (Week 1): Pt will be able to come into a stand from toilet and maintain standing for 1 min with min assist for LB clothing management. OT Short Term Goal 3 (Week 1): Pt will be able to don her pants with mod assist.  Skilled Therapeutic Interventions/Progress Updates:    Pt worked on toilet transfer using the [place] with overall min assist and mod instructional cueing for hand placement with sit to stand as well as for sequencing steps and RW.  Pt with pain reported 10/10 with weightbearing so nursing notified and pain meds brought.  Pt able to also perform sit to stand from the wheelchair with min assist as well.  In standing worked on releasing the left and right UEs at times to hold and drink from her cup.  Pt feeling more confident with releasing the LUE. Vs the right.    Therapy Documentation Precautions:  Precautions Precautions: Fall Restrictions Weight Bearing Restrictions: No RLE Weight Bearing: Weight bearing as tolerated  Vital Signs: Therapy Vitals Pulse Rate: 72 Resp: 20 BP: 135/81 mmHg Patient Position, if appropriate: Sitting Oxygen Therapy SpO2: 93 % O2 Device: None (Room air) Pulse Oximetry Type: Intermittent Pain: Pain Assessment Pain Assessment: 0-10 Pain Score: 9  Pain Type: Acute pain Pain Location: Hip Pain Orientation: Right Pain Onset: On-going Patients Stated Pain Goal: 6 Pain Intervention(s): Medication (See eMAR);Repositioned;RN made aware ADL: See FIM for current functional status  Therapy/Group: Individual Therapy  Kristilyn Coltrane OTR/L 07/02/2013, 3:10 PM

## 2013-07-03 LAB — CBC
HCT: 29.5 % — ABNORMAL LOW (ref 36.0–46.0)
HEMOGLOBIN: 9.9 g/dL — AB (ref 12.0–15.0)
MCH: 33.7 pg (ref 26.0–34.0)
MCHC: 33.6 g/dL (ref 30.0–36.0)
MCV: 100.3 fL — ABNORMAL HIGH (ref 78.0–100.0)
Platelets: 462 10*3/uL — ABNORMAL HIGH (ref 150–400)
RBC: 2.94 MIL/uL — ABNORMAL LOW (ref 3.87–5.11)
RDW: 14.4 % (ref 11.5–15.5)
WBC: 15.7 10*3/uL — ABNORMAL HIGH (ref 4.0–10.5)

## 2013-07-03 MED ORDER — DOCUSATE SODIUM 50 MG/5ML PO LIQD
100.0000 mg | Freq: Every day | ORAL | Status: DC | PRN
  Filled 2013-07-03: qty 10

## 2013-07-03 NOTE — Progress Notes (Signed)
Subjective/Complaints: 63 y.o. female with history of alcohol abuse, depression with anxiety, smoker who fell on 06/18/13 with onset of pain and difficulty wight bearing on RLE and inability to walk. She was admitted on 06/21/13 for work up. sje was found to have right comminuted IT hip fracture as well as 7.2 x 5.0 x 6.4 cm necrotic right upper lobe mass (most consistent with primary lung malignancy) locally invasive to anterior mediastinum as well as enlarged right hilar node with smaller pericarnal nodes and additional sub-centimeter RUL pulmonary nodules. She underwent right hip IM nail fixation by Dr. Percell Miller as well as bronchoscopy with tracheobronchial needle aspiration of R-paratracheal mass. Bone biopsy of right hip negative for cancer. Cytology positive for non-small cell carcinoma. Dr. Humphrey Rolls consulted for input and recommends full workup and chemotherapy--patient not a surgical candidate. Final decision to be made after recovery and d/c to home.   Freq stools, no abd pain, on BID colace  no SOB off O2 with sat 94% Review of Systems - Negative except cough, anxiety  Objective: Vital Signs: Blood pressure 167/90, pulse 67, temperature 97.7 F (36.5 C), temperature source Oral, resp. rate 16, height 5' 3"  (1.6 m), weight 62.596 kg (138 lb), SpO2 95.00%. No results found. Results for orders placed during the hospital encounter of 06/29/13 (from the past 72 hour(s))  COMPREHENSIVE METABOLIC PANEL     Status: Abnormal   Collection Time    06/30/13 11:25 AM      Result Value Range   Sodium 133 (*) 137 - 147 mEq/L   Potassium 3.8  3.7 - 5.3 mEq/L   Chloride 90 (*) 96 - 112 mEq/L   CO2 32  19 - 32 mEq/L   Glucose, Bld 144 (*) 70 - 99 mg/dL   BUN 10  6 - 23 mg/dL   Creatinine, Ser 0.45 (*) 0.50 - 1.10 mg/dL   Calcium 8.8  8.4 - 10.5 mg/dL   Total Protein 5.9 (*) 6.0 - 8.3 g/dL   Albumin 2.3 (*) 3.5 - 5.2 g/dL   AST 31  0 - 37 U/L   ALT 43 (*) 0 - 35 U/L   Alkaline Phosphatase 159 (*) 39  - 117 U/L   Total Bilirubin 1.1  0.3 - 1.2 mg/dL   GFR calc non Af Amer >90  >90 mL/min   GFR calc Af Amer >90  >90 mL/min   Comment: (NOTE)     The eGFR has been calculated using the CKD EPI equation.     This calculation has not been validated in all clinical situations.     eGFR's persistently <90 mL/min signify possible Chronic Kidney     Disease.  CBC     Status: Abnormal   Collection Time    07/01/13  4:40 AM      Result Value Range   WBC 16.0 (*) 4.0 - 10.5 K/uL   RBC 2.81 (*) 3.87 - 5.11 MIL/uL   Hemoglobin 9.2 (*) 12.0 - 15.0 g/dL   HCT 28.4 (*) 36.0 - 46.0 %   MCV 101.1 (*) 78.0 - 100.0 fL   MCH 32.7  26.0 - 34.0 pg   MCHC 32.4  30.0 - 36.0 g/dL   RDW 14.2  11.5 - 15.5 %   Platelets 431 (*) 150 - 400 K/uL  CBC     Status: Abnormal   Collection Time    07/02/13  5:58 AM      Result Value Range   WBC 16.4 (*) 4.0 -  10.5 K/uL   RBC 3.01 (*) 3.87 - 5.11 MIL/uL   Hemoglobin 10.0 (*) 12.0 - 15.0 g/dL   HCT 29.7 (*) 36.0 - 46.0 %   MCV 98.7  78.0 - 100.0 fL   MCH 33.2  26.0 - 34.0 pg   MCHC 33.7  30.0 - 36.0 g/dL   RDW 14.2  11.5 - 15.5 %   Platelets 458 (*) 150 - 400 K/uL  CLOSTRIDIUM DIFFICILE BY PCR     Status: None   Collection Time    07/02/13  8:08 AM      Result Value Range   C difficile by pcr NEGATIVE  NEGATIVE  CBC     Status: Abnormal   Collection Time    07/03/13  4:00 AM      Result Value Range   WBC 15.7 (*) 4.0 - 10.5 K/uL   RBC 2.94 (*) 3.87 - 5.11 MIL/uL   Hemoglobin 9.9 (*) 12.0 - 15.0 g/dL   HCT 29.5 (*) 36.0 - 46.0 %   MCV 100.3 (*) 78.0 - 100.0 fL   MCH 33.7  26.0 - 34.0 pg   MCHC 33.6  30.0 - 36.0 g/dL   RDW 14.4  11.5 - 15.5 %   Platelets 462 (*) 150 - 400 K/uL     HEENT: normal Cardio: irregular and no murmurs Resp: CTA B/L and unlabored GI: BS positive and non distended Extremity:  Pulses positive and No Edema Skin:   Intact and Wound C/D/I and ecchymosis around thigh lat right Neuro: Alert/Oriented, Flat, Cranial Nerve II-XII  normal, Normal Sensory and Abnormal Motor 3-/5 R HF, KE, ankle DF and PF otherwise 4/5 throughout Musc/Skel:  Other Decreased AROM R hip Gen NAD   Assessment/Plan: 1. Functional deficits secondary to Right hip fracture after fall which require 3+ hours per day of interdisciplinary therapy in a comprehensive inpatient rehab setting. Physiatrist is providing close team supervision and 24 hour management of active medical problems listed below. Physiatrist and rehab team continue to assess barriers to discharge/monitor patient progress toward functional and medical goals. FIM: FIM - Bathing Bathing Steps Patient Completed: Chest;Right Arm;Left Arm;Abdomen;Front perineal area;Right upper leg;Left upper leg;Buttocks;Left lower leg (including foot) Bathing: 4: Min-Patient completes 8-9 [id number] parts or 75+ percent  FIM - Upper Body Dressing/Undressing Upper body dressing/undressing steps patient completed: Thread/unthread right sleeve of pullover shirt/dresss;Thread/unthread left sleeve of pullover shirt/dress;Put head through opening of pull over shirt/dress;Pull shirt over trunk Upper body dressing/undressing: 5: Set-up assist to: Obtain clothing/put away FIM - Lower Body Dressing/Undressing Lower body dressing/undressing steps patient completed: Thread/unthread right underwear leg;Thread/unthread left underwear leg;Thread/unthread right pants leg;Thread/unthread left pants leg;Pull pants up/down (uses briefs, briefs were prefastened) Lower body dressing/undressing: 3: Mod-Patient completed 50-74% of tasks  FIM - Toileting Toileting steps completed by patient: Performs perineal hygiene Toileting: 0: Activity did not occur  FIM - TRadio producerDevices: Elevated toilet seat Toilet Transfers: 3-From toilet/BSC: Mod A (lift or lower assist)  FIM - BControl and instrumentation engineerDevices: Bed rails;Arm rests Bed/Chair Transfer: 4: Supine > Sit: Min A  (steadying Pt. > 75%/lift 1 leg)  FIM - Locomotion: Wheelchair Distance: 40 Locomotion: Wheelchair: 1: Total Assistance/staff pushes wheelchair (Pt<25%) FIM - Locomotion: Ambulation Locomotion: Ambulation Assistive Devices: Parallel bars Ambulation/Gait Assistance: Not tested (comment) Locomotion: Ambulation: 0: Activity did not occur  Comprehension Comprehension Mode: Auditory Comprehension: 5-Follows basic conversation/direction: With extra time/assistive device  Expression Expression Mode: Verbal Expression: 5-Expresses basic needs/ideas: With extra  time/assistive device  Social Interaction Social Interaction: 5-Interacts appropriately 90% of the time - Needs monitoring or encouragement for participation or interaction.  Problem Solving Problem Solving: 5-Solves basic problems: With no assist  Memory Memory: 5-Recognizes or recalls 90% of the time/requires cueing < 10% of the time  Medical Problem List and Plan:  1. DVT Prophylaxis/Anticoagulation: Discussed need of anticoagulation with Dr. Alverda Skeans v/s benefits of blood thinners. He recommends changing patient to eliquis (due to ETOH abuse hx) and to follow up in office for decision regarding need for long term anticoagulation.  2. Pain Management: Continue prn hydrocodone for now. Add tramadol additionally as needed.  3. H/o panic attacks/depression/Mood: Continue zoloft. Decrease ativan to 0.5 mg prn. LCSW to follow for evaluation.  4. Neuropsych: This patient is capable of making decisions on her own behalf.  5. Fluid overload: Still sounds wet--refused lasix today due to frequency. Check daily weights. Add low salt restrictions. Encourage medication compliance. Wean oxygen as able.  -follow daily I's and O's as well as weights  6. A fib with RVR: currently in NSR on amiodarone and lopressor. To decrease amiodarone to 200 mg bid starting 07/01/13 then 200 mg daily past one week. Start eliquis.  7. Non small-cell Lung  Cancer: Discussion regarding treatment options past recovery per reports. To follow up with Dr. Humphrey Rolls past discharge. Will see if they will need PICC line 8. Reactive Leucocytosis: Will monitor labs and fever curve 9. ABLA: Will add iron supplement  10. Hypokalemia: Due to diuretics. Will add daily supplement.  11. Alcohol abuse: No withdrawal signs reported. No recent urine tox, pt reported to PA 4 "large scotch and waters" per day, but reported no"heavy"  ETOH to me since 2002 12.  Hallucinations-resolved 13.  Diarrhea- change colace to prnLOS (Days) 4 A FACE TO FACE EVALUATION WAS PERFORMED  Stephanie Oliver 07/03/2013, 10:20 AM

## 2013-07-04 ENCOUNTER — Inpatient Hospital Stay (HOSPITAL_COMMUNITY): Admitting: Physical Therapy

## 2013-07-04 ENCOUNTER — Inpatient Hospital Stay (HOSPITAL_COMMUNITY): Admitting: *Deleted

## 2013-07-04 ENCOUNTER — Inpatient Hospital Stay (HOSPITAL_COMMUNITY): Payer: No Typology Code available for payment source

## 2013-07-04 LAB — CBC
HCT: 30.6 % — ABNORMAL LOW (ref 36.0–46.0)
Hemoglobin: 10.2 g/dL — ABNORMAL LOW (ref 12.0–15.0)
MCH: 33.3 pg (ref 26.0–34.0)
MCHC: 33.3 g/dL (ref 30.0–36.0)
MCV: 100 fL (ref 78.0–100.0)
Platelets: 458 10*3/uL — ABNORMAL HIGH (ref 150–400)
RBC: 3.06 MIL/uL — ABNORMAL LOW (ref 3.87–5.11)
RDW: 14.5 % (ref 11.5–15.5)
WBC: 13.6 10*3/uL — AB (ref 4.0–10.5)

## 2013-07-04 LAB — URINALYSIS, ROUTINE W REFLEX MICROSCOPIC
BILIRUBIN URINE: NEGATIVE
Glucose, UA: NEGATIVE mg/dL
HGB URINE DIPSTICK: NEGATIVE
KETONES UR: NEGATIVE mg/dL
Leukocytes, UA: NEGATIVE
NITRITE: NEGATIVE
PROTEIN: NEGATIVE mg/dL
Specific Gravity, Urine: 1.012 (ref 1.005–1.030)
UROBILINOGEN UA: 1 mg/dL (ref 0.0–1.0)
pH: 7 (ref 5.0–8.0)

## 2013-07-04 MED ORDER — FUROSEMIDE 40 MG PO TABS
40.0000 mg | ORAL_TABLET | Freq: Every day | ORAL | Status: DC
  Administered 2013-07-05 – 2013-07-13 (×9): 40 mg via ORAL
  Filled 2013-07-04 (×10): qty 1

## 2013-07-04 NOTE — Progress Notes (Signed)
Physical Therapy Session Note  Patient Details  Name: Stephanie Oliver MRN: [id number] Date of Birth: [birth year]  Today's Date: 07/04/2013 Time: 1300-1400 Time Calculation (min): 60 min  Short Term Goals: Week 1:  PT Short Term Goal 1 (Week 1): Pt will perform bed mobility with min assist. PT Short Term Goal 2 (Week 1): Pt will perform bed>< w/c transfer with mod assist, 1 person. PT Short Term Goal 3 (Week 1): Pt will propel w/c x 25' supervision. PT Short Term Goal 4 (Week 1): Pt will lock w/c brakes with 1 cue. PT Short Term Goal 5 (Week 1): Pt will perform gait x 15' with assistance of 1 person.  Skilled Therapeutic Interventions/Progress Updates:  Pt received in bathroom with RN present in room. PT assisted pt with toilet transfer. Pt able to complete hygiene and PT pulled pants up. Pt completed toileted transfer/squat pivot to w/c with mod A and cues for safety. Pt propelled w/c towards gym, completing 65 feet before fatiguing, using BUE with cues for pacing and safety awareness when negotiating obstacles. Pt requires min A to negotiate obstacles and for turning. Pt propelled 35 feet x1 before fatigue again. Pt reports pain is increased today after being up all night going to the bathroom (8/10). RN aware and meds given. Pt able to ambulate 15 feet x1 and 10 feet x1 with the RW and min/mod A with intermittent knee buckling of the RLE. Pt requires cue to promote increased BOS and decrease stride length of the RLE. Pt fatigues quickly, requiring lengthy rest breaks. Pt's O2 sats monitored throughout activity, remaining WFL at 94-96% on RA. Pt instructed in stair negotiation; negotiating up/down 3 steps using bilateral railings with max A due to positive knee buckling of the LLE. Pt very discouraged after completing stair training, reporting "I don't know how I am going to be able to do this at home." Pt completed seated ther ex, including toe raises, heel raises, LAQ's, hamstring curls and hip abd  against manual resistance, 1x12.   Therapy Documentation Precautions:  Precautions Precautions: Fall Restrictions Weight Bearing Restrictions: No RLE Weight Bearing: Weight bearing as tolerated  Therapy Vitals Temp: 97.5 F (36.4 C) Temp src: Oral Pulse Rate: 77 Resp: 18 BP: 132/82 mmHg Patient Position, if appropriate: Sitting Oxygen Therapy SpO2: 98 % O2 Device: None (Room air) FiO2 (%): 21 % Pain: Pain Assessment Pain Assessment: 0-10 Pain Score: 5  Pain Type: Surgical pain Pain Location: Hip Pain Orientation: Right Pain Descriptors / Indicators: Aching;Sore Pain Intervention(s): RN made aware  See FIM for current functional status  Therapy/Group: Individual Therapy  Samar Venneman R 07/04/2013, 4:24 PM

## 2013-07-04 NOTE — Progress Notes (Signed)
Subjective/Complaints: 63 y.o. female with history of alcohol abuse, depression with anxiety, smoker who fell on 06/18/13 with onset of pain and difficulty wight bearing on RLE and inability to walk. She was admitted on 06/21/13 for work up. sje was found to have right comminuted IT hip fracture as well as 7.2 x 5.0 x 6.4 cm necrotic right upper lobe mass (most consistent with primary lung malignancy) locally invasive to anterior mediastinum as well as enlarged right hilar node with smaller pericarnal nodes and additional sub-centimeter RUL pulmonary nodules. She underwent right hip IM nail fixation by Dr. Percell Miller as well as bronchoscopy with tracheobronchial needle aspiration of R-paratracheal mass. Bone biopsy of right hip negative for cancer. Cytology positive for non-small cell carcinoma. Dr. Humphrey Rolls consulted for input and recommends full workup and chemotherapy--patient not a surgical candidate. Final decision to be made after recovery and d/c to home.   Freq stools,resolve Urinary freq but no dysuria  no SOB off O2 with sat 94% Review of Systems - Negative except cough, anxiety  Objective: Vital Signs: Blood pressure 163/75, pulse 66, temperature 97.8 F (36.6 C), temperature source Oral, resp. rate 18, height 5\' 3"  (1.6 m), weight 60.8 kg (134 lb 0.6 oz), SpO2 97.00%. No results found. Results for orders placed during the hospital encounter of 06/29/13 (from the past 72 hour(s))  CBC     Status: Abnormal   Collection Time    07/02/13  5:58 AM      Result Value Range   WBC 16.4 (*) 4.0 - 10.5 K/uL   RBC 3.01 (*) 3.87 - 5.11 MIL/uL   Hemoglobin 10.0 (*) 12.0 - 15.0 g/dL   HCT 29.7 (*) 36.0 - 46.0 %   MCV 98.7  78.0 - 100.0 fL   MCH 33.2  26.0 - 34.0 pg   MCHC 33.7  30.0 - 36.0 g/dL   RDW 14.2  11.5 - 15.5 %   Platelets 458 (*) 150 - 400 K/uL  CLOSTRIDIUM DIFFICILE BY PCR     Status: None   Collection Time    07/02/13  8:08 AM      Result Value Range   C difficile by pcr NEGATIVE   NEGATIVE  CBC     Status: Abnormal   Collection Time    07/03/13  4:00 AM      Result Value Range   WBC 15.7 (*) 4.0 - 10.5 K/uL   RBC 2.94 (*) 3.87 - 5.11 MIL/uL   Hemoglobin 9.9 (*) 12.0 - 15.0 g/dL   HCT 29.5 (*) 36.0 - 46.0 %   MCV 100.3 (*) 78.0 - 100.0 fL   MCH 33.7  26.0 - 34.0 pg   MCHC 33.6  30.0 - 36.0 g/dL   RDW 14.4  11.5 - 15.5 %   Platelets 462 (*) 150 - 400 K/uL  CBC     Status: Abnormal   Collection Time    07/04/13  5:10 AM      Result Value Range   WBC 13.6 (*) 4.0 - 10.5 K/uL   RBC 3.06 (*) 3.87 - 5.11 MIL/uL   Hemoglobin 10.2 (*) 12.0 - 15.0 g/dL   HCT 30.6 (*) 36.0 - 46.0 %   MCV 100.0  78.0 - 100.0 fL   MCH 33.3  26.0 - 34.0 pg   MCHC 33.3  30.0 - 36.0 g/dL   RDW 14.5  11.5 - 15.5 %   Platelets 458 (*) 150 - 400 K/uL     HEENT: normal Cardio:  irregular and no murmurs Resp: CTA B/L and unlabored GI: BS positive and non distended Extremity:  Pulses positive and No Edema Skin:   Intact and Wound C/D/I and ecchymosis around thigh lat right Neuro: Alert/Oriented, Flat, Cranial Nerve II-XII normal, Normal Sensory and Abnormal Motor 3-/5 R HF, KE, ankle DF and PF otherwise 4/5 throughout Musc/Skel:  Other Decreased AROM R hip Gen NAD   Assessment/Plan: 1. Functional deficits secondary to Right hip fracture after fall which require 3+ hours per day of interdisciplinary therapy in a comprehensive inpatient rehab setting. Physiatrist is providing close team supervision and 24 hour management of active medical problems listed below. Physiatrist and rehab team continue to assess barriers to discharge/monitor patient progress toward functional and medical goals. FIM: FIM - Bathing Bathing Steps Patient Completed: Chest;Right Arm;Left Arm;Abdomen;Front perineal area;Right upper leg;Left upper leg;Buttocks;Right lower leg (including foot) Bathing: 5: Set-up assist to: Obtain items  FIM - Upper Body Dressing/Undressing Upper body dressing/undressing steps  patient completed: Thread/unthread right sleeve of pullover shirt/dresss;Thread/unthread left sleeve of pullover shirt/dress;Put head through opening of pull over shirt/dress;Pull shirt over trunk Upper body dressing/undressing: 0: Wears gown/pajamas-no public clothing FIM - Lower Body Dressing/Undressing Lower body dressing/undressing steps patient completed: Thread/unthread right underwear leg;Thread/unthread left underwear leg;Thread/unthread right pants leg;Thread/unthread left pants leg;Pull pants up/down Lower body dressing/undressing: 3: Mod-Patient completed 50-74% of tasks  FIM - Toileting Toileting steps completed by patient: Performs perineal hygiene;Adjust clothing prior to toileting Toileting Assistive Devices: Grab bar or rail for support Toileting: 3: Mod-Patient completed 2 of 3 steps  FIM - Radio producer Devices: Elevated toilet seat Toilet Transfers: 0-Activity did not occur;3-To toilet/BSC: Mod A (lift or lower assist);3-From toilet/BSC: Mod A (lift or lower assist)  FIM - Bed/Chair Transfer Bed/Chair Transfer Assistive Devices: Bed rails;Arm rests Bed/Chair Transfer: 4: Supine > Sit: Min A (steadying Pt. > 75%/lift 1 leg);3: Bed > Chair or W/C: Mod A (lift or lower assist)  FIM - Locomotion: Wheelchair Distance: 40 Locomotion: Wheelchair: 1: Total Assistance/staff pushes wheelchair (Pt<25%) FIM - Locomotion: Ambulation Locomotion: Ambulation Assistive Devices: Parallel bars Ambulation/Gait Assistance: Not tested (comment) Locomotion: Ambulation: 0: Activity did not occur  Comprehension Comprehension Mode: Auditory Comprehension: 5-Follows basic conversation/direction: With extra time/assistive device  Expression Expression Mode: Verbal Expression: 5-Expresses basic needs/ideas: With extra time/assistive device  Social Interaction Social Interaction: 5-Interacts appropriately 90% of the time - Needs monitoring or encouragement for  participation or interaction.  Problem Solving Problem Solving: 5-Solves basic problems: With no assist  Memory Memory: 5-Recognizes or recalls 90% of the time/requires cueing < 10% of the time  Medical Problem List and Plan:  1. DVT Prophylaxis/Anticoagulation: Discussed need of anticoagulation with Dr. Alverda Skeans v/s benefits of blood thinners. He recommends changing patient to eliquis (due to ETOH abuse hx) and to follow up in office for decision regarding need for long term anticoagulation.  2. Pain Management: Continue prn hydrocodone for now. Add tramadol additionally as needed.  3. H/o panic attacks/depression/Mood: Continue zoloft. Decrease ativan to 0.5 mg prn. LCSW to follow for evaluation.  4. Neuropsych: This patient is capable of making decisions on her own behalf.  5. Fluid overload: doesn't sound-refused lasix . Check daily weights. Add low salt restrictions. Encourage medication compliance. Wean oxygen as able.  -follow daily I's and O's as well as weights  6. A fib with RVR: currently in NSR on amiodarone and lopressor. To decrease amiodarone to 200 mg bid starting 07/01/13 then 200 mg daily past one week.  Start eliquis.  7. Non small-cell Lung Cancer: Discussion regarding treatment options past recovery per reports. To follow up with Dr. Humphrey Rolls past discharge. Will see if they will need PICC line 8. Reactive Leucocytosis: Will monitor labs and fever curve 9. ABLA: Will add iron supplement  10.  Dependant edema add TEDs 11.  Urinary frequency-check UA, May be mobilizing fluid 12. Hypokalemia: Due to diuretics. Will add daily supplement.  13. Alcohol abuse: No withdrawal signs reported. No recent urine tox, pt reported to PA 4 "large scotch and waters" per day, but reported no"heavy"  ETOH to me since 2002 LOS (Days) 5 A FACE TO FACE EVALUATION WAS PERFORMED  Alysia Penna E 07/04/2013, 10:27 AM

## 2013-07-04 NOTE — Progress Notes (Signed)
Occupational Therapy Note Patient Details  Name: Anaise Sterbenz MRN: [id number] Date of Birth: 23-Jul-1950 Today's Date: 07/04/2013  Time: 0730-0815  (45 min)  1st session Pain:  9/10  Right hip Individual session  1st session:  Pt lying in bed upon OT arrival.  Easliy aroused.  MIn assist  Supine to sit.  Transfer wc to shower bench.with mod assist.  Transfer from wc to toilet to wc to shower seat with minimal assist.  Pt. did sit to stand for peri care.  Washed LLE with minimal assist.  Transferred out to wc and completed dressing at sink.  Left pt in wc with call bell,phone within reach.     Lisa Roca 07/04/2013, 7:41 AM

## 2013-07-04 NOTE — Progress Notes (Signed)
Physical Therapy Note  Patient Details  Name: Yazmina Pareja MRN: [id number] Date of Birth: [birth year] Today's Date: 07/04/2013  1440 -1443 (50 minutes) individual Pain: 7/10 right hip/ premedicated Precautions: WBAT right LE Focus of treatment: bed mobility training; gait training; therapeutic exercises focused on bilateral LE strengthening/ AROM right hip Treatment: Pt up in wc upon arrival; wc mobility- 45 feet using bilateral UEs with SBA/ increased time; transfer stand/turn RW min assist ; sit to supine (mat) min assist right LE; supine to sit mod/max assist bilateral LEs and trunk; therapeutic exercise X 20 (bilateral LEs)- ankle pumps, heel slides (using sheet to self asssist + AA), SAQs, hip abduction on left; gait 12 feet RW min assist for safety; returned to room with needs within reach.        Luismanuel Corman,JIM 07/04/2013, 3:30 PM

## 2013-07-05 ENCOUNTER — Inpatient Hospital Stay (HOSPITAL_COMMUNITY)

## 2013-07-05 ENCOUNTER — Encounter (HOSPITAL_COMMUNITY): Payer: No Typology Code available for payment source

## 2013-07-05 ENCOUNTER — Inpatient Hospital Stay (HOSPITAL_COMMUNITY): Admitting: Occupational Therapy

## 2013-07-05 DIAGNOSIS — C3490 Malignant neoplasm of unspecified part of unspecified bronchus or lung: Secondary | ICD-10-CM

## 2013-07-05 DIAGNOSIS — W19XXXA Unspecified fall, initial encounter: Secondary | ICD-10-CM

## 2013-07-05 DIAGNOSIS — S72143A Displaced intertrochanteric fracture of unspecified femur, initial encounter for closed fracture: Secondary | ICD-10-CM

## 2013-07-05 LAB — CBC
HCT: 29.8 % — ABNORMAL LOW (ref 36.0–46.0)
Hemoglobin: 10.1 g/dL — ABNORMAL LOW (ref 12.0–15.0)
MCH: 33.6 pg (ref 26.0–34.0)
MCHC: 33.9 g/dL (ref 30.0–36.0)
MCV: 99 fL (ref 78.0–100.0)
PLATELETS: 466 10*3/uL — AB (ref 150–400)
RBC: 3.01 MIL/uL — ABNORMAL LOW (ref 3.87–5.11)
RDW: 14.6 % (ref 11.5–15.5)
WBC: 12.9 10*3/uL — ABNORMAL HIGH (ref 4.0–10.5)

## 2013-07-05 MED ORDER — ZOLPIDEM TARTRATE 5 MG PO TABS
5.0000 mg | ORAL_TABLET | Freq: Every day | ORAL | Status: DC
  Administered 2013-07-05 – 2013-07-12 (×8): 5 mg via ORAL
  Filled 2013-07-05 (×8): qty 1

## 2013-07-05 MED ORDER — LORAZEPAM 0.5 MG PO TABS
0.2500 mg | ORAL_TABLET | Freq: Four times a day (QID) | ORAL | Status: DC | PRN
  Administered 2013-07-05 – 2013-07-13 (×12): 0.25 mg via ORAL
  Filled 2013-07-05 (×12): qty 1

## 2013-07-05 NOTE — Progress Notes (Signed)
Patient with increased complaints of pain and anxiety. Ativan and Ultram used. Support given. Teaching done on scheduled Tylenol and pain medication. Anti-anxiety dose adjusted today by MD. Tonita Cong, RN

## 2013-07-05 NOTE — Progress Notes (Signed)
Subjective/Complaints: 63 y.o. female with history of alcohol abuse, depression with anxiety, smoker who fell on 06/18/13 with onset of pain and difficulty wight bearing on RLE and inability to walk. She was admitted on 06/21/13 for work up. sje was found to have right comminuted IT hip fracture as well as 7.2 x 5.0 x 6.4 cm necrotic right upper lobe mass (most consistent with primary lung malignancy) locally invasive to anterior mediastinum as well as enlarged right hilar node with smaller pericarnal nodes and additional sub-centimeter RUL pulmonary nodules. She underwent right hip IM nail fixation by Dr. Percell Miller as well as bronchoscopy with tracheobronchial needle aspiration of R-paratracheal mass. Bone biopsy of right hip negative for cancer. Cytology positive for non-small cell carcinoma. Dr. Humphrey Rolls consulted for input and recommends full workup and chemotherapy--patient not a surgical candidate. Final decision to be made after recovery and d/c to home.   Freq stools,resolve Urinary freq but no dysuria  no SOB off O2 with sat 94% Review of Systems - Negative except cough, anxiety  Objective: Vital Signs: Blood pressure 157/68, pulse 69, temperature 97.3 F (36.3 C), temperature source Oral, resp. rate 18, height 5\' 3"  (1.6 m), weight 59.3 kg (130 lb 11.7 oz), SpO2 99.00%. No results found. Results for orders placed during the hospital encounter of 06/29/13 (from the past 72 hour(s))  CBC     Status: Abnormal   Collection Time    07/03/13  4:00 AM      Result Value Range   WBC 15.7 (*) 4.0 - 10.5 K/uL   RBC 2.94 (*) 3.87 - 5.11 MIL/uL   Hemoglobin 9.9 (*) 12.0 - 15.0 g/dL   HCT 29.5 (*) 36.0 - 46.0 %   MCV 100.3 (*) 78.0 - 100.0 fL   MCH 33.7  26.0 - 34.0 pg   MCHC 33.6  30.0 - 36.0 g/dL   RDW 14.4  11.5 - 15.5 %   Platelets 462 (*) 150 - 400 K/uL  CBC     Status: Abnormal   Collection Time    07/04/13  5:10 AM      Result Value Range   WBC 13.6 (*) 4.0 - 10.5 K/uL   RBC 3.06 (*)  3.87 - 5.11 MIL/uL   Hemoglobin 10.2 (*) 12.0 - 15.0 g/dL   HCT 30.6 (*) 36.0 - 46.0 %   MCV 100.0  78.0 - 100.0 fL   MCH 33.3  26.0 - 34.0 pg   MCHC 33.3  30.0 - 36.0 g/dL   RDW 14.5  11.5 - 15.5 %   Platelets 458 (*) 150 - 400 K/uL  URINALYSIS, ROUTINE W REFLEX MICROSCOPIC     Status: None   Collection Time    07/04/13 10:39 AM      Result Value Range   Color, Urine YELLOW  YELLOW   APPearance CLEAR  CLEAR   Specific Gravity, Urine 1.012  1.005 - 1.030   pH 7.0  5.0 - 8.0   Glucose, UA NEGATIVE  NEGATIVE mg/dL   Hgb urine dipstick NEGATIVE  NEGATIVE   Bilirubin Urine NEGATIVE  NEGATIVE   Ketones, ur NEGATIVE  NEGATIVE mg/dL   Protein, ur NEGATIVE  NEGATIVE mg/dL   Urobilinogen, UA 1.0  0.0 - 1.0 mg/dL   Nitrite NEGATIVE  NEGATIVE   Leukocytes, UA NEGATIVE  NEGATIVE   Comment: MICROSCOPIC NOT DONE ON URINES WITH NEGATIVE PROTEIN, BLOOD, LEUKOCYTES, NITRITE, OR GLUCOSE <1000 mg/dL.  CBC     Status: Abnormal   Collection Time  07/05/13  4:30 AM      Result Value Range   WBC 12.9 (*) 4.0 - 10.5 K/uL   RBC 3.01 (*) 3.87 - 5.11 MIL/uL   Hemoglobin 10.1 (*) 12.0 - 15.0 g/dL   HCT 29.8 (*) 36.0 - 46.0 %   MCV 99.0  78.0 - 100.0 fL   MCH 33.6  26.0 - 34.0 pg   MCHC 33.9  30.0 - 36.0 g/dL   RDW 14.6  11.5 - 15.5 %   Platelets 466 (*) 150 - 400 K/uL     HEENT: normal Cardio: irregular and no murmurs Resp: CTA B/L and unlabored GI: BS positive and non distended Extremity:  Pulses positive and No Edema Skin:   Intact and Wound C/D/I and ecchymosis around thigh lat right Neuro: Alert/Oriented, Flat, Cranial Nerve II-XII normal, Normal Sensory and Abnormal Motor 3-/5 R HF, KE, ankle DF and PF otherwise 4/5 throughout Musc/Skel:  Other Decreased AROM R hip Gen NAD   Assessment/Plan: 1. Functional deficits secondary to Right hip fracture after fall which require 3+ hours per day of interdisciplinary therapy in a comprehensive inpatient rehab setting. Physiatrist is providing  close team supervision and 24 hour management of active medical problems listed below. Physiatrist and rehab team continue to assess barriers to discharge/monitor patient progress toward functional and medical goals. FIM: FIM - Bathing Bathing Steps Patient Completed: Chest;Right Arm;Left Arm;Abdomen;Front perineal area;Right upper leg;Left upper leg;Buttocks;Right lower leg (including foot) Bathing: 5: Set-up assist to: Obtain items  FIM - Upper Body Dressing/Undressing Upper body dressing/undressing steps patient completed: Thread/unthread right sleeve of pullover shirt/dresss;Thread/unthread left sleeve of pullover shirt/dress;Put head through opening of pull over shirt/dress;Pull shirt over trunk Upper body dressing/undressing: 0: Wears gown/pajamas-no public clothing FIM - Lower Body Dressing/Undressing Lower body dressing/undressing steps patient completed: Thread/unthread right underwear leg;Thread/unthread left underwear leg;Thread/unthread right pants leg;Thread/unthread left pants leg;Pull pants up/down Lower body dressing/undressing: 3: Mod-Patient completed 50-74% of tasks  FIM - Toileting Toileting steps completed by patient: Performs perineal hygiene Toileting Assistive Devices: Grab bar or rail for support Toileting: 2: Max-Patient completed 1 of 3 steps  FIM - Radio producer Devices: Elevated toilet seat Toilet Transfers: 3-To toilet/BSC: Mod A (lift or lower assist);3-From toilet/BSC: Mod A (lift or lower assist)  FIM - Control and instrumentation engineer Devices: Walker;Arm rests Bed/Chair Transfer: 3: Chair or W/C > Bed: Mod A (lift or lower assist);2: Chair or W/C > Bed: Max A (lift and lower assist)  FIM - Locomotion: Wheelchair Distance: 80 Locomotion: Wheelchair: 2: Travels 50 - 149 ft with minimal assistance (Pt.>75%) FIM - Locomotion: Ambulation Locomotion: Ambulation Assistive Devices: Administrator Ambulation/Gait  Assistance: 3: Mod assist Locomotion: Ambulation: 1: Travels less than 50 ft with moderate assistance (Pt: 50 - 74%)  Comprehension Comprehension Mode: Auditory Comprehension: 5-Follows basic conversation/direction: With extra time/assistive device  Expression Expression Mode: Verbal Expression: 5-Expresses basic needs/ideas: With extra time/assistive device  Social Interaction Social Interaction: 5-Interacts appropriately 90% of the time - Needs monitoring or encouragement for participation or interaction.  Problem Solving Problem Solving: 5-Solves basic problems: With no assist  Memory Memory: 5-Recognizes or recalls 90% of the time/requires cueing < 10% of the time  Medical Problem List and Plan:  1. DVT Prophylaxis/Anticoagulation: Discussed need of anticoagulation with Dr. Alverda Skeans v/s benefits of blood thinners. He recommends changing patient to eliquis (due to ETOH abuse hx) and to follow up in office for decision regarding need for long term anticoagulation.  2.  Pain Management: Continue prn hydrocodone for now. Add tramadol additionally as needed.  3. H/o panic attacks/depression/Mood: Continue zoloft. Decrease ativan to 0.5 mg prn. LCSW to follow for evaluation.  4. Neuropsych: This patient is capable of making decisions on her own behalf.  5. Fluid overload: doesn't sound-refused lasix . Check daily weights. Add low salt restrictions. Encourage medication compliance. Wean oxygen as able.  -follow daily I's and O's as well as weights  6. A fib with RVR: currently in NSR on amiodarone and lopressor. To decrease amiodarone to 200 mg bid starting 07/01/13 then 200 mg daily past one week. Start eliquis.  7. Non small-cell Lung Cancer: Discussion regarding treatment options past recovery per reports. To follow up with Dr. Humphrey Rolls past discharge. Will see if they will need PICC line 8. Reactive Leucocytosis: Will monitor labs and fever curve 9. ABLA: Will add iron supplement   10.  Dependant edema add TEDs 11.  Urinary frequency-check UA, May be mobilizing fluid 12. Hypokalemia: Due to diuretics. Will add daily supplement.  13. Alcohol abuse: No withdrawal signs reported. No recent urine tox, pt reported to [place] 4 "large scotch and waters" per day, but reported no"heavy"  ETOH to me since 2002 14.  Insomnia, daytime anxiety improve, will reduce ativan and add ambien at noc LOS (Days) 6 A FACE TO FACE EVALUATION WAS PERFORMED  KIRSTEINS,ANDREW E 07/05/2013, 8:37 AM

## 2013-07-05 NOTE — Progress Notes (Signed)
Occupational Therapy Session Notes  Patient Details  Name: Stephanie Oliver MRN: [id number] Date of Birth: 1950/08/25  Today's Date: 07/05/2013 Time: 1015-1100 and 210-255 Time Calculation (min): 45 min and 45 min  Short Term Goals: Week 1:  OT Short Term Goal 1 (Week 1): Pt will be able to transfer to toilet with mod assist using a stand pivot transfer. OT Short Term Goal 2 (Week 1): Pt will be able to come into a stand from toilet and maintain standing for 1 min with min assist for LB clothing management. OT Short Term Goal 3 (Week 1): Pt will be able to don her pants with mod assist.  Skilled Therapeutic Interventions/Progress Updates:  1)  Self care session initiated with female OTA and resumed with this OT due to patient preference for a female for bathing and dressing therapy session.  Engaged in shower, dress, toileting and groom tasks.  Focused session on activity tolerance, functional mobility with RW, safe shower and toilet transfers, adaptive techniques due to pain limiting independence with LB bath and dress.  At end of session, patient was issued a LH sponge to improve independence with bathing.  2)  Patient sitting EOB and her mother was visiting.  Engaged in functional mobility with RW and ambulated to w/c sitting out in the hall, once in therapy apartment patient explored kitchen cabinets and refrigerator at a w/c level due to right groin pain, practiced simulated tub bench transfer in tub/shower combo with a RW.  Patient required assist maneuvering w/c in kitchen and assist with RLE when getting into and out of tub/shower with tub bench.  Patient reported feeling depressed due to these difficulties and forced to think about the future/realities.  Patient tearful once in her hospital room sitted EOB with her mother and social worker in the room.  Therapy Documentation Precautions:  Precautions Precautions: Fall Restrictions Weight Bearing Restrictions: No RLE Weight Bearing: Weight  bearing as tolerated Pain: 1)  10/10 right groin, yet able to participate, "Premedicated", rest and repositioned  2) 8.5 right groin, RN aware, rest and repositioned ADL: See FIM for current functional status  Therapy/Group: Individual Therapy both sessions  [place], [place] 07/05/2013, 3:50 PM

## 2013-07-05 NOTE — Progress Notes (Signed)
Social Work Patient ID: Stephanie Oliver, female   DOB: 1950/07/13, 63 y.o.   MRN: [id number] Discussion with pt and Mom regarding the obstacles of going home.  Will await progress, team feels if pain is managed better than she would be able to do more. Still stairs, care of 81 month old and dog.  Will check into pt's insurance regarding coverage, she is aware she may not have NH coverage.  Will work on safe discharge plan. Aware team doesn't recommend caring for 29 month old and the dogs at discharge.

## 2013-07-05 NOTE — Progress Notes (Signed)
Physical Therapy Note  Patient Details  Name: Stephanie Oliver MRN: [id number] Date of Birth: April 30, 1951 Today's Date: 07/05/2013 1110-1210 60 min individual therapy Pain rated 9/10 R hip, premedicated 1510-1540 30 min individual therapy Pain rated 0/10 at rest; 10/10 after gait; premedicated  Tx 1:  W/c propulsion in a straight path x 100', x 150' with supervision.  In home environment with min assist for steering.  Toilet transfer with min assist.   Simulated car transfer with RW to sedan height seat with min assist.  Gait in home setting x 25' with min guard assist, cues to widen BOS and avoid R hip IR.  Therapeutic exercise performed with R LE to increase strength for functional mobility: in sitting- R knee extension with isometric hold at end range, 10 x 1 , bil hip abduction using light green Theraband, 10 x 2 , R and L ankle pumps, 10 x 1 each.  In sitting with upright posture, bounce catch large ball 15 x 1 for activity tolerance.   Tx 2:  Sit>stand from bed with min assist, mod VC and manual placement of RLE in alignment.  Pt tends to adduct R upper leg, and place foot lateral to knee alignment, in sitting.  Gait in home setting x 25' with min guard assist, RW, WBAT RLE.Marland Kitchen  Therapeutic exercise performed with LE to increase strength for functional mobility: in sitting- marching (active assist RLE), bil hip abd against resistance, bil hip ER, knee ext as above, ankle pumps, 10 x 1 each.  Scooting L and R on bed, with bil UE support fading to 1 UE, focusing on R foot placement.  Bed> recliner using RW, min guard assist.  Pt positioned comfortably in recliner.  Mother in room.  SW reported that pt is beginning to realize that the home situation (6 steps to [facility])  and lack of caregiver support make d/c to home unsafe.  She was tearful and overwhelmed thinking about her situation and lung CA dx.  Kendy Haston 07/05/2013, 7:52 AM

## 2013-07-05 NOTE — Progress Notes (Signed)
Occupational Therapy Note  Patient Details  Name: Stephanie Oliver MRN: [id number] Date of Birth: 1950-06-18 Today's Date: 07/05/2013  Time: 1000-1015 Pt denies pain Individual Therapy  Initiated self care session.  Pt requested female therapist and OTR continued therapy session.   Leotis Shames [facility] 07/05/2013, 3:45 PM

## 2013-07-06 ENCOUNTER — Inpatient Hospital Stay (HOSPITAL_COMMUNITY): Admitting: Occupational Therapy

## 2013-07-06 ENCOUNTER — Inpatient Hospital Stay (HOSPITAL_COMMUNITY): Admitting: *Deleted

## 2013-07-06 LAB — CBC
HCT: 30.4 % — ABNORMAL LOW (ref 36.0–46.0)
Hemoglobin: 10.3 g/dL — ABNORMAL LOW (ref 12.0–15.0)
MCH: 33.8 pg (ref 26.0–34.0)
MCHC: 33.9 g/dL (ref 30.0–36.0)
MCV: 99.7 fL (ref 78.0–100.0)
PLATELETS: 424 10*3/uL — AB (ref 150–400)
RBC: 3.05 MIL/uL — ABNORMAL LOW (ref 3.87–5.11)
RDW: 14.5 % (ref 11.5–15.5)
WBC: 12.4 10*3/uL — AB (ref 4.0–10.5)

## 2013-07-06 NOTE — Progress Notes (Signed)
Occupational Therapy Session Note  Patient Details  Name: Stephanie Oliver MRN: [id number] Date of Birth: 08/28/1950  Today's Date: 07/06/2013 Time: 0900-1000 Time Calculation (min): 60 min  Short Term Goals: Week 1:  OT Short Term Goal 1 (Week 1): Pt will be able to transfer to toilet with mod assist using a stand pivot transfer. OT Short Term Goal 2 (Week 1): Pt will be able to come into a stand from toilet and maintain standing for 1 min with min assist for LB clothing management. OT Short Term Goal 3 (Week 1): Pt will be able to don her pants with mod assist.  Skilled Therapeutic Interventions/Progress Updates:    Pt seen for BADL retraining of toileting and dressing with a focus on sit to stand and standing balance using RW for support. Pt was able to maintain standing with close supervision while she adjusted clothing over her hips. She is also reaching better to don pants over feet but continues to have difficulty with shoes.  Pt's transfers to the toilet have improved greatly. She only needed slight steadying assist.  Pt went to gym to work on standing tolerance while engaging in functional activity. Standing tolerance is only 3 minutes but she has more stability.  Ue strengthening with 2# dowel rod. Pt returned to room and transferred to recliner to have feet elevated.    Therapy Documentation Precautions:  Precautions Precautions: Fall Restrictions Weight Bearing Restrictions: Yes RLE Weight Bearing: Weight bearing as tolerated    Vital Signs: Oxygen Therapy O2 Device: None (Room air) Pain: Pain Assessment Pain Score: 7  - Pt was premedicated prior to session ADL: See FIM for current functional status  Therapy/Group: Individual Therapy  Karrisa Didio 07/06/2013, 12:05 PM

## 2013-07-06 NOTE — Progress Notes (Signed)
Social Work Patient ID: Stephanie Oliver, female   DOB: 1951-06-03, 63 y.o.   MRN: [id number] Met with pt who reports being upset with her daughter.  She reports daughter wants her to complete POA and HCPOA.  She feels if her daughter sat down and  Talked to her she would know her wishes.  She does not want this worker to call daughter at this time.  She feels sh is capable of making her own decisions and her daughter  Is only worried about herself.  Daughter is not willing to assist pt at home and just keeps asking what are we going to do for pt.  Pt is aware of the concerns we have steps and  Taking the dog outside.  If pt gets too high level she will not be eligible to go to NH, and it seems she is getting there now.  Will continue to work on safe discharge plan.

## 2013-07-06 NOTE — Progress Notes (Signed)
Occupational Therapy Session Note  Patient Details  Name: Margan Elias MRN: [id number] Date of Birth: 02-01-1951  Today's Date: 07/06/2013 Time: 1430-1500 Time Calculation (min): 30 min  Short Term Goals: Week 1:  OT Short Term Goal 1 (Week 1): Pt will be able to transfer to toilet with mod assist using a stand pivot transfer. OT Short Term Goal 2 (Week 1): Pt will be able to come into a stand from toilet and maintain standing for 1 min with min assist for LB clothing management. OT Short Term Goal 3 (Week 1): Pt will be able to don her pants with mod assist.  Skilled Therapeutic Interventions/Progress Updates:  Patient resting in recliner upon arrival.  Engaged in grooming at sink, w/c mobility propelling forward and backward, and standing tolerance.  Focused session on ambulate with RW to sink and stand for grooming, table top activity with patient able to stand for 8 min while keeping focus on activity.  Patient requested to sit due to back pain.  Once back in her room, patient transferred w/c>recliner and assisted with elevating BLEs due to edema.  Therapy Documentation Precautions:  Precautions Precautions: Fall Restrictions Weight Bearing Restrictions: Yes RLE Weight Bearing: Weight bearing as tolerated Pain: 6/10 pain in groin, premedicated, rest and repositioned  Therapy/Group: Individual Therapy  Mahayla Haddaway 07/06/2013, 4:20 PM

## 2013-07-06 NOTE — Progress Notes (Signed)
Subjective/Complaints: 63 y.o. female with history of alcohol abuse, depression with anxiety, smoker who fell on 06/18/13 with onset of pain and difficulty wight bearing on RLE and inability to walk. She was admitted on 06/21/13 for work up. sje was found to have right comminuted IT hip fracture as well as 7.2 x 5.0 x 6.4 cm necrotic right upper lobe mass (most consistent with primary lung malignancy) locally invasive to anterior mediastinum as well as enlarged right hilar node with smaller pericarnal nodes and additional sub-centimeter RUL pulmonary nodules. She underwent right hip IM nail fixation by Dr. Percell Miller as well as bronchoscopy with tracheobronchial needle aspiration of R-paratracheal mass. Bone biopsy of right hip negative for cancer. Cytology positive for non-small cell carcinoma. Dr. Humphrey Rolls consulted for input and recommends full workup and chemotherapy--patient not a surgical candidate. Final decision to be made after recovery and d/c to home.   Therapy difficult yesterday Review of Systems - Negative except cough, anxiety  Objective: Vital Signs: Blood pressure 106/67, pulse 72, temperature 98.1 F (36.7 C), temperature source Oral, resp. rate 19, height 5\' 3"  (1.6 m), weight 58.9 kg (129 lb 13.6 oz), SpO2 94.00%. No results found. Results for orders placed during the hospital encounter of 06/29/13 (from the past 72 hour(s))  CBC     Status: Abnormal   Collection Time    07/04/13  5:10 AM      Result Value Range   WBC 13.6 (*) 4.0 - 10.5 K/uL   RBC 3.06 (*) 3.87 - 5.11 MIL/uL   Hemoglobin 10.2 (*) 12.0 - 15.0 g/dL   HCT 30.6 (*) 36.0 - 46.0 %   MCV 100.0  78.0 - 100.0 fL   MCH 33.3  26.0 - 34.0 pg   MCHC 33.3  30.0 - 36.0 g/dL   RDW 14.5  11.5 - 15.5 %   Platelets 458 (*) 150 - 400 K/uL  URINALYSIS, ROUTINE W REFLEX MICROSCOPIC     Status: None   Collection Time    07/04/13 10:39 AM      Result Value Range   Color, Urine YELLOW  YELLOW   APPearance CLEAR  CLEAR   Specific  Gravity, Urine 1.012  1.005 - 1.030   pH 7.0  5.0 - 8.0   Glucose, UA NEGATIVE  NEGATIVE mg/dL   Hgb urine dipstick NEGATIVE  NEGATIVE   Bilirubin Urine NEGATIVE  NEGATIVE   Ketones, ur NEGATIVE  NEGATIVE mg/dL   Protein, ur NEGATIVE  NEGATIVE mg/dL   Urobilinogen, UA 1.0  0.0 - 1.0 mg/dL   Nitrite NEGATIVE  NEGATIVE   Leukocytes, UA NEGATIVE  NEGATIVE   Comment: MICROSCOPIC NOT DONE ON URINES WITH NEGATIVE PROTEIN, BLOOD, LEUKOCYTES, NITRITE, OR GLUCOSE <1000 mg/dL.  CBC     Status: Abnormal   Collection Time    07/05/13  4:30 AM      Result Value Range   WBC 12.9 (*) 4.0 - 10.5 K/uL   RBC 3.01 (*) 3.87 - 5.11 MIL/uL   Hemoglobin 10.1 (*) 12.0 - 15.0 g/dL   HCT 29.8 (*) 36.0 - 46.0 %   MCV 99.0  78.0 - 100.0 fL   MCH 33.6  26.0 - 34.0 pg   MCHC 33.9  30.0 - 36.0 g/dL   RDW 14.6  11.5 - 15.5 %   Platelets 466 (*) 150 - 400 K/uL  CBC     Status: Abnormal   Collection Time    07/06/13  5:58 AM      Result  Value Range   WBC 12.4 (*) 4.0 - 10.5 K/uL   RBC 3.05 (*) 3.87 - 5.11 MIL/uL   Hemoglobin 10.3 (*) 12.0 - 15.0 g/dL   HCT 30.4 (*) 36.0 - 46.0 %   MCV 99.7  78.0 - 100.0 fL   MCH 33.8  26.0 - 34.0 pg   MCHC 33.9  30.0 - 36.0 g/dL   RDW 14.5  11.5 - 15.5 %   Platelets 424 (*) 150 - 400 K/uL     HEENT: normal Cardio: irregular and no murmurs Resp: CTA B/L and unlabored GI: BS positive and non distended Extremity:  Pulses positive and No Edema Skin:   Intact and Wound C/D/I and ecchymosis around thigh lat right Neuro: Alert/Oriented, Flat, Cranial Nerve II-XII normal, Normal Sensory and Abnormal Motor 3-/5 R HF, KE, ankle DF and PF otherwise 4/5 throughout Musc/Skel:  Other Decreased AROM R hip Gen NAD   Assessment/Plan: 1. Functional deficits secondary to Right hip fracture after fall which require 3+ hours per day of interdisciplinary therapy in a comprehensive inpatient rehab setting. Physiatrist is providing close team supervision and 24 hour management of active  medical problems listed below. Physiatrist and rehab team continue to assess barriers to discharge/monitor patient progress toward functional and medical goals. FIM: FIM - Bathing Bathing Steps Patient Completed: Chest;Right Arm;Left Arm;Abdomen;Front perineal area;Right upper leg;Left upper leg;Buttocks;Right lower leg (including foot) Bathing: 4: Min-Patient completes 8-9 44f 10 parts or 75+ percent  FIM - Upper Body Dressing/Undressing Upper body dressing/undressing steps patient completed: Thread/unthread right sleeve of pullover shirt/dresss;Thread/unthread left sleeve of pullover shirt/dress;Put head through opening of pull over shirt/dress;Pull shirt over trunk Upper body dressing/undressing: 5: Set-up assist to: Obtain clothing/put away FIM - Lower Body Dressing/Undressing Lower body dressing/undressing steps patient completed: Thread/unthread right pants leg;Thread/unthread left pants leg;Don/Doff left sock Lower body dressing/undressing: 3: Mod-Patient completed 50-74% of tasks  FIM - Toileting Toileting steps completed by patient: Adjust clothing prior to toileting;Performs perineal hygiene Toileting Assistive Devices: Grab bar or rail for support Toileting: 3: Mod-Patient completed 2 of 3 steps (difficulty managing hospital brief)  FIM - Radio producer Devices: Elevated toilet seat;Grab bars Toilet Transfers: 4-To toilet/BSC: Min A (steadying Pt. > 75%);4-From toilet/BSC: Min A (steadying Pt. > 75%)  FIM - Bed/Chair Transfer Bed/Chair Transfer Assistive Devices: Bed rails;Arm rests Bed/Chair Transfer: 4: Supine > Sit: Min A (steadying Pt. > 75%/lift 1 leg)  FIM - Locomotion: Wheelchair Distance: 150 Locomotion: Wheelchair: 5: Travels 150 ft or more: maneuvers on rugs and over door sills with supervision, cueing or coaxing FIM - Locomotion: Ambulation Locomotion: Ambulation Assistive Devices: Administrator Ambulation/Gait Assistance: 4: Min  guard Locomotion: Ambulation: 1: Travels less than 50 ft with minimal assistance (Pt.>75%)  Comprehension Comprehension Mode: Auditory Comprehension: 5-Follows basic conversation/direction: With extra time/assistive device  Expression Expression Mode: Verbal Expression: 5-Expresses basic needs/ideas: With extra time/assistive device  Social Interaction Social Interaction: 5-Interacts appropriately 90% of the time - Needs monitoring or encouragement for participation or interaction.  Problem Solving Problem Solving: 5-Solves basic problems: With no assist  Memory Memory: 5-Recognizes or recalls 90% of the time/requires cueing < 10% of the time  Medical Problem List and Plan:  1. DVT Prophylaxis/Anticoagulation: Discussed need of anticoagulation with Dr. Alverda Skeans v/s benefits of blood thinners. He recommends changing patient to eliquis (due to ETOH abuse hx) and to follow up in office for decision regarding need for long term anticoagulation.  2. Pain Management: Continue prn hydrocodone for  now. Add tramadol additionally as needed.  3. H/o panic attacks/depression/Mood: Continue zoloft. Decrease ativan to 0.5 mg prn. LCSW to follow for evaluation.  4. Neuropsych: This patient is capable of making decisions on her own behalf.  5. Fluid overload: improved . Check daily weights. Add low salt restrictions. Encourage medication compliance.Off oxygen -follow daily I's and O's as well as weights  6. A fib with RVR: currently in NSR on amiodarone and lopressor. To decrease amiodarone to 200 mg bid starting 07/01/13 then 200 mg daily past one week. Start eliquis.  7. Non small-cell Lung Cancer: Discussion regarding treatment options past recovery per reports. To follow up with Dr. Humphrey Rolls past discharge. Will see if they will need PICC line 8. Reactive Leucocytosis: Will monitor labs and fever curve 9. ABLA: Will add iron supplement  10.  Dependant edema add TEDs 11.  Urinary frequency-check  UA, May be mobilizing fluid 12. Hypokalemia: Due to diuretics. Will add daily supplement.  13. Alcohol abuse: No withdrawal signs reported. No recent urine tox, pt reported to PA 4 "large scotch and waters" per day, but reported no"heavy"  ETOH to me since 2002 14.  Insomnia, daytime anxiety improve, will reduce ativan and add ambien at noc LOS (Days) 7 A FACE TO FACE EVALUATION WAS PERFORMED  Stephanie Oliver E 07/06/2013, 7:53 AM

## 2013-07-06 NOTE — Progress Notes (Addendum)
Physical Therapy Session Note  Patient Details  Name: Stephanie Oliver MRN: [id number] Date of Birth: [birth year]  Today's Date: 07/06/2013 Time: 1110-1200 and 13:00-13:45 Time Calculation (min): 50 min and 8mn  Patient has met 5 of 5 short term goals.  Pt making steady progress towards functional mobility and gait goals due to increase in RLE strength, standing tolerance, activity tolerance, AROM as well as decreased pain.   Patient continues to demonstrate the following deficits: R hip weakness and pain consistent with injury and repair, as well as dynamic balance deficits, and therefore will continue to benefit from skilled PT intervention to enhance overall performance with activity tolerance, balance and functional use of  right lower extremity.  Patient progressing toward long term goals..  Plan of care revisions: Upgraded goals to be consistent with level of assist available at home. .  PT Short Term Goals Week 1:  PT Short Term Goal 1 (Week 1): Pt will perform bed mobility with min assist. PT Short Term Goal 1 - Progress (Week 1): Met PT Short Term Goal 2 (Week 1): Pt will perform bed>< w/c transfer with mod assist, 1 person. PT Short Term Goal 2 - Progress (Week 1): Met PT Short Term Goal 3 (Week 1): Pt will propel w/c x 25' supervision. PT Short Term Goal 3 - Progress (Week 1): Met PT Short Term Goal 4 (Week 1): Pt will lock w/c brakes with 1 cue. PT Short Term Goal 4 - Progress (Week 1): Met PT Short Term Goal 5 (Week 1): Pt will perform gait x 15' with assistance of 1 person. PT Short Term Goal 5 - Progress (Week 1): Met Week 2:  PT Short Term Goal 1 (Week 2): Pt will perform bed mobility with S PT Short Term Goal 2 (Week 2): Pt will perform basic transfers with S  PT Short Term Goal 3 (Week 2): Pt willl ambulate 100' with S and RW in controlled setting PT Short Term Goal 4 (Week 2): Pt will ascend 6 stairs with 2 rails and S PT Short Term Goal 5 (Week 2): Pt willl demonstrate S  level for dynamic balance during functional task   Skilled Therapeutic Interventions/Progress Updates:  1:2 Tx focused on WC propulsion, gait with RW, functional mobility, and therex for LE strengthening.  Pt navigating in room with RW and min A with safety cues. Toilet transfer with min A as well.  Gait in controlled setting 2x100 and 2x15' with RW and Min A for steadying. Pt with improved step length and weight shifting, but still limited by pain and fatigue.  WC propulsion 2x150' with S and increased time required.  Nustep for LE strengthening x183m with bil UE and LEs, level 5.  Static standing with 1/2 UE support on RW for ball toss x5m19mwithout rest break.  Seated LAQ and attempting marching 2x10 each for strengthening.  Ascended/descended 5 steps with bil rails and up to mod A for steadying, cues for sequence.  Pt left up in recliner with all needs in reach.   2:2 Tx focused on therex for increased R hip strength for increased independence with functional mobility.  Performed the following ex with AROM unless otherwise indicated 2x10 each. Verbal and tactile cues provided for optimal performance:  Seated: marching in limited range (x10), heel raises, LAQ with 5 sec holds  Standing: heel raises, marching, hip ABD Supine: heel slides, glute sets, quad sets, SAQ with 5 sec holds  Transfers with min/mod A for lifting, gait in room  with RW and min A.  Sit>supine with Min A. Supine>sit with S and step-by-step cues  Continued talking through home set-up and safest DC disposition. Pt still wants to go home with hospital bed.  Ice provided for pain following tx.         Therapy Documentation Precautions:  Precautions Precautions: Fall Restrictions Weight Bearing Restrictions: Yes RLE Weight Bearing: Weight bearing as tolerated General:   Vital Signs: Oxygen Therapy O2 Device: None (Room air) Pain: 10/10 - modified tx, nursing aware, but too early for meds. Ice p tx.      Locomotion : Ambulation Ambulation/Gait Assistance: 4: Min Building control surveyor Distance: 150   See FIM for current functional status  Therapy/Group: Individual Therapy Kennieth Rad, PT, DPT   Dwaine Deter, [place] 07/06/2013, 1:17 PM

## 2013-07-07 ENCOUNTER — Inpatient Hospital Stay (HOSPITAL_COMMUNITY): Admitting: Occupational Therapy

## 2013-07-07 ENCOUNTER — Inpatient Hospital Stay (HOSPITAL_COMMUNITY): Payer: No Typology Code available for payment source | Admitting: Occupational Therapy

## 2013-07-07 ENCOUNTER — Inpatient Hospital Stay (HOSPITAL_COMMUNITY)

## 2013-07-07 ENCOUNTER — Inpatient Hospital Stay (HOSPITAL_COMMUNITY): Admitting: *Deleted

## 2013-07-07 LAB — CBC
HCT: 31.1 % — ABNORMAL LOW (ref 36.0–46.0)
HEMOGLOBIN: 10.4 g/dL — AB (ref 12.0–15.0)
MCH: 33.2 pg (ref 26.0–34.0)
MCHC: 33.4 g/dL (ref 30.0–36.0)
MCV: 99.4 fL (ref 78.0–100.0)
Platelets: 408 10*3/uL — ABNORMAL HIGH (ref 150–400)
RBC: 3.13 MIL/uL — AB (ref 3.87–5.11)
RDW: 14.8 % (ref 11.5–15.5)
WBC: 11.9 10*3/uL — AB (ref 4.0–10.5)

## 2013-07-07 MED ORDER — LEVALBUTEROL HCL 0.63 MG/3ML IN NEBU
0.6300 mg | INHALATION_SOLUTION | Freq: Two times a day (BID) | RESPIRATORY_TRACT | Status: DC
  Administered 2013-07-07 – 2013-07-09 (×4): 0.63 mg via RESPIRATORY_TRACT
  Filled 2013-07-07 (×6): qty 3

## 2013-07-07 NOTE — Progress Notes (Addendum)
Physical Therapy Note  Patient Details  Name: Stephanie Oliver MRN: [id number] Date of Birth: Dec 01, 1950 Today's Date: 07/07/2013 1315-1400; pt had just gotten lunch and requested eating for a few minutes before starting tx.  When therapist returned to room 10 minutes later, pt did not remember why therapy was delayed. Later PT session made up time missed.  Pain rated 9/10 mid lateral thigh, premedicated in AM  Pt propelled w/c x 150' x 2 with supervision.  Supervision to manage brakes, and hand over hand assist for legrest mgt.  Legrests shortened for hip alignment; pt stated it was more comfortable.  Gait in room with RW to/from toilet and toilet transfer with supervision.  Therapeutic exercise performed with bil LEs to increase strength for functional mobility: R and L knee ext with isometric hold at end range with ankle pumps, 5 x 2; bil hip abd/adduction against resistance, 20 x 1. PROM L hip adductors in sitting with football between knees, x 5 minutes.  Standing R hamstring curls and R hip ext with flexed knee, 10 x 1 each, bil UE support on RW.  Gait with RW x 180' with supervision, including turns.  1 VC for safe hand placement on w/c rather than RW during sit> stand.   Up/down 5 steps with supervision/min guard assist, min cues for sequencing.  SW stated that pt may be able to d/c to assisted living situation.  Ambulation distances have been increased for LTGs.  Depending upon pt's memory deficits, pt may require supervision for all locomotion LTGs.  Chella Chapdelaine 07/07/2013, 1:46 PM

## 2013-07-07 NOTE — Progress Notes (Signed)
NUTRITION FOLLOW UP  Intervention:   Continue Ensure Complete prn Provide Multivitamin with minerals daily  RD to continue to monitor   NUTRITION DIAGNOSIS:  Increased nutrient needs related to recent surgery and decreased appetite as evidenced by healing, participation in rehab.   Goal:  Pt to meet >/= 90% of their estimated nutrition needs   Monitor:  Diet advancement  PO intake  Weight trends  Labs  Assessment:   63 yo active smoker with reported weight loss since 2 years ago admitted for hip fracture and noted to have large rite sided lung mass. Underwent R hip nail fixation and bronchoscopy on 1/7. Pt found incidentally to have a large 7.2 x 5.0 x 6.4 cm necrotic right upper lobe mass, most consistent with primary lung malignancy.   Pt intake has improved to 100% of meals consistently.  She is not consuming Ensure regularly but with improvement in intake, pt is likely meeting needs and may continue supplements on a prn basis. Pt confirms she is eating better and feels much better than she did last week.    Height: Ht Readings from Last 1 Encounters:  06/29/13 5\' 3"  (1.6 m)    Weight Status:   Wt Readings from Last 1 Encounters:  07/07/13 126 lb 1.6 oz (57.199 kg)    Re-estimated needs:  Kcal: 1500-1700  Protein: 70-80 grams  Fluid: >/= 1.7 L/day  Skin: incision to right hip, otherwise intact  Diet Order: General   Intake/Output Summary (Last 24 hours) at 07/07/13 1432 Last data filed at 07/07/13 1200  Gross per 24 hour  Intake   1080 ml  Output      3 ml  Net   1077 ml    Last BM: 1/21  Labs:  No results found for this basename: NA, K, CL, CO2, BUN, CREATININE, CALCIUM, MG, PHOS, GLUCOSE,  in the last 168 hours  CBG (last 3)  No results found for this basename: GLUCAP,  in the last 72 hours  Scheduled Meds: . amiodarone  200 mg Oral BID   And  . [START ON 07/09/2013] amiodarone  200 mg Oral Daily  . antiseptic oral rinse  15 mL Mouth Rinse BID   . apixaban  5 mg Oral BID  . budesonide  0.25 mg Nebulization BID  . dextromethorphan-guaiFENesin  1 tablet Oral BID  . feeding supplement (ENSURE COMPLETE)  237 mL Oral BID BM  . folic acid  1 mg Oral Daily  . furosemide  40 mg Oral Daily  . iron polysaccharides  150 mg Oral Daily  . levalbuterol  0.63 mg Nebulization BID  . metoprolol tartrate  25 mg Oral BID  . multivitamin with minerals  1 tablet Oral Daily  . potassium chloride  20 mEq Oral BID  . sertraline  100 mg Oral Daily  . thiamine  100 mg Oral Daily  . zolpidem  5 mg Oral QHS    Continuous Infusions:   Stephanie Greathouse, MS RD LDN Clinical Inpatient Dietitian Pager: [phone] Weekend/After hours pager: [phone]

## 2013-07-07 NOTE — Progress Notes (Signed)
Occupational Therapy Session Note  Patient Details  Name: Stephanie Oliver MRN: [id number] Date of Birth: [birth year]  Today's Date: 07/07/2013 Time: 0900-1000 Time Calculation (min): 60 min  Short Term Goals: Week 1:  OT Short Term Goal 1 (Week 1): Pt will be able to transfer to toilet with mod assist using a stand pivot transfer. OT Short Term Goal 2 (Week 1): Pt will be able to come into a stand from toilet and maintain standing for 1 min with min assist for LB clothing management. OT Short Term Goal 3 (Week 1): Pt will be able to don her pants with mod assist.  Skilled Therapeutic Interventions/Progress Updates:      Pt seen for BADL retraining of toileting, bathing, and dressing with a focus on functional mobility and use of AE. Pt was able to stand from EOB with supervision and ambulate to toilet with RW with mod assist and she needed increased stabilizing support.  She then moved to shower and bathed with supervision using long handled bath sponge.  Pt was able to dress herself completely except for donning right sock.  She ambulated to sink and stood for several minutes to complete her grooming. At end of session pt resting in recliner with call light and phone in reach.  Therapy Documentation Precautions:  Precautions Precautions: Fall Restrictions Weight Bearing Restrictions: No RLE Weight Bearing: Weight bearing as tolerated   Pain: Pain Assessment Pain Assessment: 0-10 Pain Score: 8  Pain Type: Surgical pain Pain Location: Hip Pain Orientation: Right Pain Descriptors / Indicators: Aching Pain Frequency: Intermittent Pain Onset: On-going Pain Intervention(s): Medication (See eMAR)  ADL:  See FIM for current functional status  Therapy/Group: Individual Therapy  SAGUIER,JULIA 07/07/2013, 11:50 AM

## 2013-07-07 NOTE — Progress Notes (Signed)
Occupational Therapy Session Note  Patient Details  Name: Stephanie Oliver MRN: [id number] Date of Birth: [birth year]  Today's Date: 07/07/2013 Time: 1502-1530 Time Calculation (min): 28 min   Skilled Therapeutic Interventions/Progress Updates:    Pt worked on sit to stand and standing endurance at the high/low table with min assist level.  Pt able to perform sit to stand with min assist and then maintain standing for 3 mins or greater during each interval of standing.  Pt encouraged to perform some weightbearing on the RLE.  Pt needing min instructional cueing for placement of his hands with min assist.    Therapy Documentation Precautions:  Precautions Precautions: Fall Restrictions Weight Bearing Restrictions: No RLE Weight Bearing: Weight bearing as tolerated  Pain: Pain Assessment Pain Assessment: 0-10 Pain Score: 7  Faces Pain Scale: Hurts even more Pain Type: Surgical pain Pain Location: Hip Pain Orientation: Right Pain Descriptors / Indicators: Aching Pain Frequency: Intermittent Pain Onset: Gradual Pain Intervention(s): Medication (See eMAR)  See FIM for current functional status  Therapy/Group: Individual Therapy  Melonie Germani OTR/L 07/07/2013, 4:06 PM

## 2013-07-07 NOTE — Progress Notes (Signed)
Subjective/Complaints: 63 y.o. female with history of alcohol abuse, depression with anxiety, smoker who fell on 06/18/13 with onset of pain and difficulty wight bearing on RLE and inability to walk. She was admitted on 06/21/13 for work up. sje was found to have right comminuted IT hip fracture as well as 7.2 x 5.0 x 6.4 cm necrotic right upper lobe mass (most consistent with primary lung malignancy) locally invasive to anterior mediastinum as well as enlarged right hilar node with smaller pericarnal nodes and additional sub-centimeter RUL pulmonary nodules. She underwent right hip IM nail fixation by Dr. Percell Miller as well as bronchoscopy with tracheobronchial needle aspiration of R-paratracheal mass. Bone biopsy of right hip negative for cancer. Cytology positive for non-small cell carcinoma. Dr. Humphrey Rolls consulted for input and recommends full workup and chemotherapy--patient not a surgical candidate. Final decision to be made after recovery and d/c to home.   Therapy difficult yesterday Review of Systems - Negative except cough, anxiety  Objective: Vital Signs: Blood pressure 146/75, pulse 75, temperature 97.9 F (36.6 C), temperature source Oral, resp. rate 18, height 5' 3"  (1.6 m), weight 57.199 kg (126 lb 1.6 oz), SpO2 93.00%. No results found. Results for orders placed during the hospital encounter of 06/29/13 (from the past 72 hour(s))  URINALYSIS, ROUTINE W REFLEX MICROSCOPIC     Status: None   Collection Time    07/04/13 10:39 AM      Result Value Range   Color, Urine YELLOW  YELLOW   APPearance CLEAR  CLEAR   Specific Gravity, Urine 1.012  1.005 - 1.030   pH 7.0  5.0 - 8.0   Glucose, UA NEGATIVE  NEGATIVE mg/dL   Hgb urine dipstick NEGATIVE  NEGATIVE   Bilirubin Urine NEGATIVE  NEGATIVE   Ketones, ur NEGATIVE  NEGATIVE mg/dL   Protein, ur NEGATIVE  NEGATIVE mg/dL   Urobilinogen, UA 1.0  0.0 - 1.0 mg/dL   Nitrite NEGATIVE  NEGATIVE   Leukocytes, UA NEGATIVE  NEGATIVE   Comment:  MICROSCOPIC NOT DONE ON URINES WITH NEGATIVE PROTEIN, BLOOD, LEUKOCYTES, NITRITE, OR GLUCOSE <1000 mg/dL.  CBC     Status: Abnormal   Collection Time    07/05/13  4:30 AM      Result Value Range   WBC 12.9 (*) 4.0 - 10.5 K/uL   RBC 3.01 (*) 3.87 - 5.11 MIL/uL   Hemoglobin 10.1 (*) 12.0 - 15.0 g/dL   HCT 29.8 (*) 36.0 - 46.0 %   MCV 99.0  78.0 - 100.0 fL   MCH 33.6  26.0 - 34.0 pg   MCHC 33.9  30.0 - 36.0 g/dL   RDW 14.6  11.5 - 15.5 %   Platelets 466 (*) 150 - 400 K/uL  CBC     Status: Abnormal   Collection Time    07/06/13  5:58 AM      Result Value Range   WBC 12.4 (*) 4.0 - 10.5 K/uL   RBC 3.05 (*) 3.87 - 5.11 MIL/uL   Hemoglobin 10.3 (*) 12.0 - 15.0 g/dL   HCT 30.4 (*) 36.0 - 46.0 %   MCV 99.7  78.0 - 100.0 fL   MCH 33.8  26.0 - 34.0 pg   MCHC 33.9  30.0 - 36.0 g/dL   RDW 14.5  11.5 - 15.5 %   Platelets 424 (*) 150 - 400 K/uL     HEENT: normal Cardio: irregular and no murmurs Resp: CTA B/L and unlabored GI: BS positive and non distended Extremity:  Pulses  positive and No Edema Skin:   Intact and Wound C/D/I and ecchymosis around thigh lat right Neuro: Alert/Oriented, Flat, Cranial Nerve II-XII normal, Normal Sensory and Abnormal Motor 3-/5 R HF, KE, ankle DF and PF otherwise 4/5 throughout Musc/Skel:  Other Decreased AROM R hip Gen NAD   Assessment/Plan: 1. Functional deficits secondary to Right hip fracture after fall which require 3+ hours per day of interdisciplinary therapy in a comprehensive inpatient rehab setting. Physiatrist is providing close team supervision and 24 hour management of active medical problems listed below. Physiatrist and rehab team continue to assess barriers to discharge/monitor patient progress toward functional and medical goals. Team conference today please see physician documentation under team conference tab, met with team face-to-face to discuss problems,progress, and goals. Formulized individual treatment plan based on medical history,  underlying problem and comorbidities. FIM: FIM - Bathing Bathing Steps Patient Completed: Chest;Right Arm;Left Arm;Abdomen;Front perineal area;Right upper leg;Left upper leg;Buttocks;Right lower leg (including foot) Bathing: 4: Min-Patient completes 8-9 [id number] parts or 75+ percent  FIM - Upper Body Dressing/Undressing Upper body dressing/undressing steps patient completed: Thread/unthread right sleeve of pullover shirt/dresss;Thread/unthread left sleeve of pullover shirt/dress;Put head through opening of pull over shirt/dress;Pull shirt over trunk Upper body dressing/undressing: 5: Set-up assist to: Obtain clothing/put away FIM - Lower Body Dressing/Undressing Lower body dressing/undressing steps patient completed: Thread/unthread right underwear leg;Thread/unthread left underwear leg;Pull underwear up/down;Thread/unthread right pants leg;Thread/unthread left pants leg;Pull pants up/down Lower body dressing/undressing: 3: Mod-Patient completed 50-74% of tasks  FIM - Toileting Toileting steps completed by patient: Adjust clothing prior to toileting;Performs perineal hygiene Toileting Assistive Devices: Grab bar or rail for support Toileting: 3: Mod-Patient completed 2 of 3 steps  FIM - TRadio producerDevices: Elevated toilet seat;Grab bars Toilet Transfers: 4-To toilet/BSC: Min A (steadying Pt. > 75%);4-From toilet/BSC: Min A (steadying Pt. > 75%)  FIM - Bed/Chair Transfer Bed/Chair Transfer Assistive Devices: Bed rails;Arm rests Bed/Chair Transfer: 4: Supine > Sit: Min A (steadying Pt. > 75%/lift 1 leg)  FIM - Locomotion: Wheelchair Distance: 150 Locomotion: Wheelchair: 0: Activity did not occur FIM - Locomotion: Ambulation Locomotion: Ambulation Assistive Devices: WAdministratorAmbulation/Gait Assistance: 4: Min guard Locomotion: Ambulation: 1: Travels less than 50 ft with minimal assistance (Pt.>75%)  Comprehension Comprehension Mode:  Auditory Comprehension: 5-Follows basic conversation/direction: With extra time/assistive device  Expression Expression Mode: Verbal Expression: 5-Expresses basic needs/ideas: With extra time/assistive device  Social Interaction Social Interaction: 5-Interacts appropriately 90% of the time - Needs monitoring or encouragement for participation or interaction.  Problem Solving Problem Solving: 5-Solves basic problems: With no assist  Memory Memory: 5-Recognizes or recalls 90% of the time/requires cueing < 10% of the time  Medical Problem List and Plan:  1. DVT Prophylaxis/Anticoagulation: Discussed need of anticoagulation with Dr. HAlverda Skeansv/s benefits of blood thinners. He recommends changing patient to eliquis (due to ETOH abuse hx) and to follow up in office for decision regarding need for long term anticoagulation.  2. Pain Management: Continue prn hydrocodone for now. Add tramadol additionally as needed.  3. H/o panic attacks/depression/Mood: Continue zoloft. Decrease ativan to 0.5 mg prn. LCSW to follow for evaluation.  4. Neuropsych: This patient is capable of making decisions on her own behalf.  5. Fluid overload: improved . Check daily weights. Add low salt restrictions. Encourage medication compliance.Off oxygen -follow daily I's and O's as well as weights  6. A fib with RVR: currently in NSR on amiodarone and lopressor. To decrease amiodarone to 200 mg bid starting  07/01/13 then 200 mg daily past one week. Start eliquis.  7. Non small-cell Lung Cancer: Discussion regarding treatment options past recovery per reports. To follow up with Dr. Humphrey Rolls past discharge. Will see if they will need PICC line 8. Reactive Leucocytosis: Will monitor labs and fever curve 9. ABLA: Will add iron supplement  10.  Dependant edema add TEDs 11.  Urinary frequency-check UA, May be mobilizing fluid 12. Hypokalemia: Due to diuretics. Will add daily supplement.  13. Alcohol abuse: No withdrawal  signs reported. No recent urine tox, pt reported to [place] 4 "large scotch and waters" per day, but reported no"heavy"  ETOH to me since 2002 14.  Insomnia, daytime anxiety improve, will reduce ativan and add ambien at noc LOS (Days) 8 A FACE TO FACE EVALUATION WAS PERFORMED  KIRSTEINS,ANDREW E 07/07/2013, 6:28 AM

## 2013-07-07 NOTE — Progress Notes (Signed)
Physical Therapy Session Note  Patient Details  Name: Stephanie Oliver MRN: [id number] Date of Birth: 08/18/1950  Today's Date: 07/07/2013 Time: 15:45-16:35 (66min)   Skilled Therapeutic Interventions/Progress Updates:  Tx focused on gait with RW, therex for LE strength and ROM, and home setting mobility.  Pt up in [place], propelled x150' with S Transfers and bed mobility with min A for LE lifting. Pt recalled bed mobility technique taught yesterday..  Supine HEP with handout including 2x10 of each of the following with cues for technique:  Ankle pumps, quad set, glute sets, SAQ, SLR, hip ADB. Pt needing min-assist with SLR, but able to complete all ex with increased time and effort.  Pt needing rest break x3 throughout due to fatigue and pain.    Gait training with RW in controlled setting 1x35 and 1x85' with min-guard A. Gait marked by decreased R weight-bearing, increased R step length, and hip/knee IR. Distance limited by fatigue.  Pt wanting to rest EOB in room, min A transfer and bed alarm on. All needs in reach.    Therapy Documentation Precautions:  Precautions Precautions: Fall Restrictions Weight Bearing Restrictions: No RLE Weight Bearing: Weight bearing as tolerated General:      Pain: 9/10 R hip pain during ex, rest breaks provided, and iced p tx. Pt able to easily complete the ex despite reports of pain    Locomotion : Ambulation Ambulation/Gait Assistance: 4: Min guard Wheelchair Mobility Distance: 150   See FIM for current functional status  Therapy/Group: Individual Therapy Kennieth Rad, PT, DPT  07/07/2013, 4:13 PM

## 2013-07-08 ENCOUNTER — Inpatient Hospital Stay (HOSPITAL_COMMUNITY)

## 2013-07-08 ENCOUNTER — Inpatient Hospital Stay (HOSPITAL_COMMUNITY): Admitting: Occupational Therapy

## 2013-07-08 ENCOUNTER — Encounter (HOSPITAL_COMMUNITY): Payer: No Typology Code available for payment source

## 2013-07-08 DIAGNOSIS — G9340 Encephalopathy, unspecified: Secondary | ICD-10-CM

## 2013-07-08 LAB — CBC
HEMATOCRIT: 30.6 % — AB (ref 36.0–46.0)
Hemoglobin: 10.2 g/dL — ABNORMAL LOW (ref 12.0–15.0)
MCH: 33.2 pg (ref 26.0–34.0)
MCHC: 33.3 g/dL (ref 30.0–36.0)
MCV: 99.7 fL (ref 78.0–100.0)
PLATELETS: 414 10*3/uL — AB (ref 150–400)
RBC: 3.07 MIL/uL — AB (ref 3.87–5.11)
RDW: 14.5 % (ref 11.5–15.5)
WBC: 12 10*3/uL — AB (ref 4.0–10.5)

## 2013-07-08 NOTE — Progress Notes (Signed)
Occupational Therapy Weekly Progress Note  Patient Details  Name: Stephanie Oliver MRN: [id number] Date of Birth: [birth year]  Today's Date: 07/08/2013 Time: 0905-1000 and 1330-1415 Time Calculation (min): 55 min and 45 min  Patient has met 3 of 3 short term goals.  Pt has been progressing well and has become more independent with her mobility, standing balance and tolerance.  Patient continues to demonstrate the following deficits: right hip pain prohibits safe ambulation and transfers, RLE weakness, generalized muscle weakness, memory deficits and therefore will continue to benefit from skilled OT intervention to enhance overall performance with BADL.  Patient progressing toward long term goals..  Plan of care revisions: LTGS of bathing, toileting, toilet transfers, and LB dressing upgraded to supervision.  .  OT Short Term Goals Week 1:  OT Short Term Goal 1 (Week 1): Pt will be able to transfer to toilet with mod assist using a stand pivot transfer. OT Short Term Goal 1 - Progress (Week 1): Met OT Short Term Goal 2 (Week 1): Pt will be able to come into a stand from toilet and maintain standing for 1 min with min assist for LB clothing management. OT Short Term Goal 2 - Progress (Week 1): Met OT Short Term Goal 3 (Week 1): Pt will be able to don her pants with mod assist. OT Short Term Goal 3 - Progress (Week 1): Met Week 2:  OT Short Term Goal 1 (Week 2): Pt will don socks and shoes with AE with set up/ supervision. OT Short Term Goal 2 (Week 2): Pt will transfer to tub bench in tub with supervision.  Skilled Therapeutic Interventions/Progress Updates:    Visit 1:  Pain Assessment Pain Assessment: 0-10 Pain Score: 9  Pain Type: Surgical pain Pain Location: Hip Pain Orientation: Right Pain Descriptors / Indicators: Aching Pain Intervention(s): RN made aware TX: Pt seen for BADL retraining of toileting, grooming and dressing using her RW to complete ambulation around the room.  Pt was  able to complete all tasks with supervision except for donning her R sock. She does not have shoes.  Pt stood at sink to complete grooming. She then propelled w/c to therapy gym to transfer to Nustep with min A. Pt used Nustep for 15 minutes to increase hip flexibility.    Visit 2:  Pain: No c/o pain. Tx: Pt's 2 brothers and sister in law were present and visiting from [place].  Social worker present also. Session spent discussing discharge needs of equipment, assistance at home 24/7, who would provide assistance, need to have dogs in a different room.  Pt agreed that she would need to hire a caregiver 24/7. Pt worked on ambulating with Rw into tub room to practice tub bench transfers.  Pt was able to get into tub with supervision and min A to get out of tub to lift right leg out.  Pt worked with using sock aid to don sock on right foot. Pt propelled self to gym for her PT session.  Therapy Documentation Precautions:  Precautions Precautions: Fall Restrictions Weight Bearing Restrictions: No RLE Weight Bearing: Weight bearing as tolerated  ADL:  See FIM for current functional status  Therapy/Group: Individual Therapy  SAGUIER,JULIA 07/08/2013, 11:35 AM

## 2013-07-08 NOTE — Progress Notes (Signed)
Subjective/Complaints: 63 y.o. female with history of alcohol abuse, depression with anxiety, smoker who fell on 06/18/13 with onset of pain and difficulty wight bearing on RLE and inability to walk. She was admitted on 06/21/13 for work up. sje was found to have right comminuted IT hip fracture as well as 7.2 x 5.0 x 6.4 cm necrotic right upper lobe mass (most consistent with primary lung malignancy) locally invasive to anterior mediastinum as well as enlarged right hilar node with smaller pericarnal nodes and additional sub-centimeter RUL pulmonary nodules. She underwent right hip IM nail fixation by Dr. Percell Miller as well as bronchoscopy with tracheobronchial needle aspiration of R-paratracheal mass. Bone biopsy of right hip negative for cancer. Cytology positive for non-small cell carcinoma. Dr. Humphrey Rolls consulted for input and recommends full workup and chemotherapy--patient not a surgical candidate. Final decision to be made after recovery and d/c to home.   Patient states that she will not go to an assisted living facility. Her plan is to go home. We discussed 24 hour supervision and states that she will have this for 1 week from her mother. We discussed no driving. We also discussed no drinking and she became angry when I mention this. Review of Systems - Negative except cough, anxiety  Objective: Vital Signs: Blood pressure 146/83, pulse 71, temperature 97 F (36.1 C), temperature source Oral, resp. rate 17, height 5\' 3"  (1.6 m), weight 58.2 kg (128 lb 4.9 oz), SpO2 96.00%. No results found. Results for orders placed during the hospital encounter of 06/29/13 (from the past 72 hour(s))  CBC     Status: Abnormal   Collection Time    07/06/13  5:58 AM      Result Value Range   WBC 12.4 (*) 4.0 - 10.5 K/uL   RBC 3.05 (*) 3.87 - 5.11 MIL/uL   Hemoglobin 10.3 (*) 12.0 - 15.0 g/dL   HCT 30.4 (*) 36.0 - 46.0 %   MCV 99.7  78.0 - 100.0 fL   MCH 33.8  26.0 - 34.0 pg   MCHC 33.9  30.0 - 36.0 g/dL    RDW 14.5  11.5 - 15.5 %   Platelets 424 (*) 150 - 400 K/uL  CBC     Status: Abnormal   Collection Time    07/07/13  6:40 AM      Result Value Range   WBC 11.9 (*) 4.0 - 10.5 K/uL   RBC 3.13 (*) 3.87 - 5.11 MIL/uL   Hemoglobin 10.4 (*) 12.0 - 15.0 g/dL   HCT 31.1 (*) 36.0 - 46.0 %   MCV 99.4  78.0 - 100.0 fL   MCH 33.2  26.0 - 34.0 pg   MCHC 33.4  30.0 - 36.0 g/dL   RDW 14.8  11.5 - 15.5 %   Platelets 408 (*) 150 - 400 K/uL  CBC     Status: Abnormal   Collection Time    07/08/13  5:55 AM      Result Value Range   WBC 12.0 (*) 4.0 - 10.5 K/uL   RBC 3.07 (*) 3.87 - 5.11 MIL/uL   Hemoglobin 10.2 (*) 12.0 - 15.0 g/dL   HCT 30.6 (*) 36.0 - 46.0 %   MCV 99.7  78.0 - 100.0 fL   MCH 33.2  26.0 - 34.0 pg   MCHC 33.3  30.0 - 36.0 g/dL   RDW 14.5  11.5 - 15.5 %   Platelets 414 (*) 150 - 400 K/uL     HEENT: normal Cardio: irregular  and no murmurs Resp: CTA B/L and unlabored GI: BS positive and non distended Extremity:  Pulses positive and No Edema Skin:   Intact and Wound C/D/I and ecchymosis around thigh lat right Neuro: Alert/Oriented, Flat, Cranial Nerve II-XII normal, Normal Sensory and Abnormal Motor 3-/5 R HF, KE, ankle DF and PF otherwise 4/5 throughout Musc/Skel:  Other Decreased AROM R hip Gen NAD   Assessment/Plan: 1. Functional deficits secondary to Right hip fracture after fall which require 3+ hours per day of interdisciplinary therapy in a comprehensive inpatient rehab setting. Physiatrist is providing close team supervision and 24 hour management of active medical problems listed below. Physiatrist and rehab team continue to assess barriers to discharge/monitor patient progress toward functional and medical goals.  FIM: FIM - Bathing Bathing Steps Patient Completed: Chest;Right Arm;Left Arm;Abdomen;Front perineal area;Right upper leg;Left upper leg;Buttocks;Right lower leg (including foot);Left lower leg (including foot) Bathing: 5: Supervision: Safety issues/verbal  cues (used long handled sponge)  FIM - Upper Body Dressing/Undressing Upper body dressing/undressing steps patient completed: Thread/unthread right sleeve of pullover shirt/dresss;Thread/unthread left sleeve of pullover shirt/dress;Put head through opening of pull over shirt/dress;Pull shirt over trunk Upper body dressing/undressing: 5: Set-up assist to: Obtain clothing/put away FIM - Lower Body Dressing/Undressing Lower body dressing/undressing steps patient completed: Thread/unthread right underwear leg;Thread/unthread left underwear leg;Pull underwear up/down;Thread/unthread right pants leg;Thread/unthread left pants leg;Pull pants up/down;Don/Doff left sock Lower body dressing/undressing: 3: Mod-Patient completed 50-74% of tasks  FIM - Toileting Toileting steps completed by patient: Adjust clothing prior to toileting;Performs perineal hygiene;Adjust clothing after toileting Toileting Assistive Devices: Grab bar or rail for support Toileting: 3: Mod-Patient completed 2 of 3 steps  FIM - Radio producer Devices: Elevated toilet seat;Grab bars Toilet Transfers: 4-To toilet/BSC: Min A (steadying Pt. > 75%);4-From toilet/BSC: Min A (steadying Pt. > 75%)  FIM - Bed/Chair Transfer Bed/Chair Transfer Assistive Devices: Bed rails;Arm rests Bed/Chair Transfer: 4: Supine > Sit: Min A (steadying Pt. > 75%/lift 1 leg)  FIM - Locomotion: Wheelchair Distance: 150 Locomotion: Wheelchair: 5: Travels 150 ft or more: maneuvers on rugs and over door sills with supervision, cueing or coaxing FIM - Locomotion: Ambulation Locomotion: Ambulation Assistive Devices: Administrator Ambulation/Gait Assistance: 5: Supervision Locomotion: Ambulation: 5: Travels 150 ft or more with supervision/safety issues  Comprehension Comprehension Mode: Auditory Comprehension: 5-Follows basic conversation/direction: With extra time/assistive device  Expression Expression Mode:  Verbal Expression: 5-Expresses basic needs/ideas: With extra time/assistive device  Social Interaction Social Interaction: 5-Interacts appropriately 90% of the time - Needs monitoring or encouragement for participation or interaction.  Problem Solving Problem Solving: 5-Solves basic problems: With no assist  Memory Memory: 5-Recognizes or recalls 90% of the time/requires cueing < 10% of the time  Medical Problem List and Plan:  1. DVT Prophylaxis/Anticoagulation: Discussed need of anticoagulation with Dr. Alverda Skeans v/s benefits of blood thinners. He recommends changing patient to eliquis (due to ETOH abuse hx) and to follow up in office for decision regarding need for long term anticoagulation.  2. Pain Management: Continue prn hydrocodone for now. Add tramadol additionally as needed.  3. H/o panic attacks/depression/Mood: Continue zoloft. Decrease ativan to 0.5 mg prn. LCSW to follow for evaluation.  4. Neuropsych: This patient is capable of making decisions on her own behalf.  5. Fluid overload: improved . Check daily weights. Add low salt restrictions. Encourage medication compliance.Off oxygen -follow daily I's and O's as well as weights  6. A fib with RVR: currently in NSR on amiodarone and lopressor. To decrease amiodarone  to 200 mg bid starting 07/01/13 then 200 mg daily past one week. Start eliquis.  7. Non small-cell Lung Cancer: Discussion regarding treatment options past recovery per reports. To follow up with Dr. Humphrey Rolls past discharge. Will see if they will need PICC line 8. Reactive Leucocytosis: Will monitor labs and fever curve 9. ABLA: Will add iron supplement, stable d/c daily CBC 10.  Dependant edema add TEDs 11.  Urinary frequency-check UA, May be mobilizing fluid 12. Hypokalemia: Due to diuretics. Will add daily supplement.  13. Alcohol abuse: No withdrawal signs reported. No recent urine tox, pt reported to PA 4 "large scotch and waters" per day, but reported  no"heavy"  ETOH to me since 2002 14.  Insomnia, daytime anxiety improve, will reduce ativan and add ambien at noc LOS (Days) 9 A FACE TO FACE EVALUATION WAS PERFORMED  KIRSTEINS,ANDREW E 07/08/2013, 7:55 AM

## 2013-07-08 NOTE — Progress Notes (Signed)
Social Work Patient ID: Stephanie Oliver, female   DOB: 09/21/1950, 63 y.o.   MRN: [id number] Met with pt to discuss team conference progression and revised goals of mod/i level, supervision stairs.  Same issues are care of dogs, grandchild and Split level home.  Pt is reaching a level where her insurance will not cover NHP and her level will be assisted living level, which is private pay. Pt reports she can not afford Assisted living, so this option is out.  Worker will check her insurance regarding NH coverage at the level she is at.  Pt is aware team does not recommend her caring for her grandson or  Taking out her dogs.  She is aware of this.  Will ask team if stayed here longer could she be mod/i on the stairs.  Unsure at this point what the best option is for pt.  Will continue to work on this. Daughter not offering any assistance and pt's Mother is returning home on 1/29.

## 2013-07-08 NOTE — Patient Care Conference (Signed)
Inpatient RehabilitationTeam Conference and Plan of Care Update Date: 07/07/2013   Time: 11;10 AM    Patient Name: Stephanie Oliver      Medical Record Number: [id number]  Date of Birth: 1950-07-01 Sex: Female         Room/Bed: 4M02C/4M02C-01 Payor Info: Payor: Theme park manager / Plan: [employer] / Product Type: *No Product type* /    Admitting Diagnosis: R HIP FX  Admit Date/Time:  06/29/2013  6:20 PM Admission Comments: No comment available   Primary Diagnosis:  <principal problem not specified> Principal Problem: <principal problem not specified>  Patient Active Problem List   Diagnosis Date Noted  . Hip fracture requiring operative repair 06/29/2013  . PAF (paroxysmal atrial fibrillation) 06/24/2013  . Non-small cell cancer of right lung 06/24/2013  . Encephalopathy acute 06/23/2013  . Mediastinal lymphadenopathy 06/22/2013  . COPD (chronic obstructive pulmonary disease) 06/22/2013  . Tobacco use disorder 06/22/2013  . Intertrochanteric fracture of right hip 06/21/2013  . Alcohol abuse, continuous 06/21/2013  . Former smoker 06/21/2013  . Hypertension 06/21/2013  . Osteoporosis 06/21/2013  . Bipolar 1 disorder 06/21/2013  . Hip fracture 06/21/2013    Expected Discharge Date: Expected Discharge Date: 07/13/13  Team Members Present: Physician leading conference: Dr. Alysia Penna Social Worker Present: Alfonse Alpers, LCSW;Becky Jalexis Breed, LCSW Nurse Present: Other (comment) Maudry Mayhew Rosero-RN) PT Present: Georjean Mode, PT;Other (comment) Jilda Roche) OT Present: Antony Salmon, Roland Earl, OT SLP Present: Germain Osgood, SLP PPS Coordinator present : Daiva Nakayama, RN, CRRN     Current Status/Progress Goal Weekly Team Focus  Medical   confusion clearing no hallucinations  improve safety and mob to Mod I, Asst living  D/C planning   Bowel/Bladder   continent/incont of bladder  manage bowel and bladder with timed toileting and briefs  timed toileting and monitor  incontinence freqency   Swallow/Nutrition/ Hydration   n/a         ADL's   great progress this week, with upgrade some goals at end of week.  steady A with toilet and shower transfers, mod assist LB dressing, min A bathing  min A with LB self care, min A with transfers  ADL retraining with AE to increase Independence, standing tolerance and balance, functional mobility training   Mobility   Min A bed mob, transfers, and gait x75' with RW, Mod 5 stairs with 2 rails  Upgraded to mod I transfers and gait, S stairs, Min A car  therex for RLE strengthening, transfers, gait in varying conditions, and stairs with 1 rail - problem solving home set-up vs SNF   Communication     [facility]        Safety/Cognition/ Behavioral Observations    No safety issues        Pain   pain @ 7-9 constantly  Pain managed with prn medications  monitor effectiveness of medication and intolerance to activity   Skin   steri strips rt hip; incision rt leg  incision healing and no breakdown  monitor incision daily      *See Care Plan and progress notes for long and short-term goals.  Barriers to Discharge: social situation complicated    Possible Resolutions to Barriers:  Cont Rehab    Discharge Planning/Teaching Needs:  HOme versus Assisting Living-pt getting too high level to go to NH.  Still trying to come up with a safe discharge plan      Team Discussion:  Pt reaching mod/i level for ambulation and supervision for stairs.  Complication  is split level home, and dog care, along with 52 month old grandson.  Doubtful insurance will cover NH too high level  Revisions to Treatment Plan:  Upgraded goals to mod/i-ambulation, supervision stairs   Continued Need for Acute Rehabilitation Level of Care: The patient requires daily medical management by a physician with specialized training in physical medicine and rehabilitation for the following conditions: Daily direction of a multidisciplinary physical rehabilitation  program to ensure safe treatment while eliciting the highest outcome that is of practical value to the patient.: Yes Daily medical management of patient stability for increased activity during participation in an intensive rehabilitation regime.: Yes Daily analysis of laboratory values and/or radiology reports with any subsequent need for medication adjustment of medical intervention for : Neurological problems;Other;Post surgical problems  [name] 07/08/2013, 8:46 AM

## 2013-07-08 NOTE — Progress Notes (Signed)
Physical Therapy Note  Patient Details  Name: Stephanie Oliver MRN: [id number] Date of Birth: Oct 07, 1950 Today's Date: 07/08/2013 9628-3662  60 Individual therapy  pain R hip rated 8/10; premedicated  1435-1505 30 min Pain R hip 8/10, declined meds  Tx 1:  Self stretching RLE with static sitting, foot below knee, ball between knees, x 3 minutes.  Gait x 160' with RW, min guard/S after initial min assist due to R knee buckling upon wt bearing. Up/down 5 steps 2 rails, min guard/S assist.  Pt required 1 VC initially for sequencing.  Therapeutic exercise performed with RLE to increase strength for functional mobility in standing with bil UE support: 10 x 1 step/taps onto 6" high step, bil calf raises with and R hip abd;   5 x 1 hamstring curls and hip ext with flexed knee.  Kinetron at 30- 40 cm/sec, in sitting, rated 13 on Borg exertion scale, working on full excursion R and knee aligned below hip,  in sitting and high sitting positions.  Therapist instructed pt to remember until next PT session:  correct sequence for stair management.  Tx 2: Pt's brothers and sister in law observed tx.  Family is urging pt to pay for [facility] aide and live on upper level of her home. Her brother stated that there is a space at top of stairs where a wall will prevent her from easily using the RW at the top.   Therapeutic exercise performed with RLE to increase strength for functional mobility, as above.  Gait x 40' with RW with supervision.  Up/down 5 steps with 2 rails, supervision.  Pt incorrectly remembered sequence for stairs, discussed in AM session.  Pt stated she is easily confused by L/R designations, and was PTA.  Therapeutic activity in standing: balance retraining on compliant Airex mat, calf raises and toe raises with use of RW, and  Mini squats with R rotation to retrieve items from stool at knee height and place on another surface, 10 x 1 each.  Pt propelled w/c x 150' with distant supervision.   Leigh Blas 07/08/2013, 11:25 AM

## 2013-07-09 ENCOUNTER — Inpatient Hospital Stay (HOSPITAL_COMMUNITY): Admitting: *Deleted

## 2013-07-09 ENCOUNTER — Inpatient Hospital Stay (HOSPITAL_COMMUNITY): Admitting: Occupational Therapy

## 2013-07-09 ENCOUNTER — Inpatient Hospital Stay (HOSPITAL_COMMUNITY): Payer: No Typology Code available for payment source | Admitting: Occupational Therapy

## 2013-07-09 DIAGNOSIS — S72143A Displaced intertrochanteric fracture of unspecified femur, initial encounter for closed fracture: Secondary | ICD-10-CM

## 2013-07-09 DIAGNOSIS — C3490 Malignant neoplasm of unspecified part of unspecified bronchus or lung: Secondary | ICD-10-CM

## 2013-07-09 DIAGNOSIS — W19XXXA Unspecified fall, initial encounter: Secondary | ICD-10-CM

## 2013-07-09 NOTE — Progress Notes (Signed)
Subjective/Complaints: 63 y.o. female with history of alcohol abuse, depression with anxiety, smoker who fell on 06/18/13 with onset of pain and difficulty wight bearing on RLE and inability to walk. She was admitted on 06/21/13 for work up. sje was found to have right comminuted IT hip fracture as well as 7.2 x 5.0 x 6.4 cm necrotic right upper lobe mass (most consistent with primary lung malignancy) locally invasive to anterior mediastinum as well as enlarged right hilar node with smaller pericarnal nodes and additional sub-centimeter RUL pulmonary nodules. She underwent right hip IM nail fixation by Dr. Percell Miller as well as bronchoscopy with tracheobronchial needle aspiration of R-paratracheal mass. Bone biopsy of right hip negative for cancer. Cytology positive for non-small cell carcinoma. Dr. Humphrey Rolls consulted for input and recommends full workup and chemotherapy--patient not a surgical candidate. Final decision to be made after recovery and d/c to home.   Not using PICC, no c/os  Review of Systems - Negative except cough, anxiety  Objective: Vital Signs: Blood pressure 94/57, pulse 84, temperature 98.1 F (36.7 C), temperature source Oral, resp. rate 17, height 5\' 3"  (1.6 m), weight 57.8 kg (127 lb 6.8 oz), SpO2 98.00%. No results found. Results for orders placed during the hospital encounter of 06/29/13 (from the past 72 hour(s))  CBC     Status: Abnormal   Collection Time    07/07/13  6:40 AM      Result Value Range   WBC 11.9 (*) 4.0 - 10.5 K/uL   RBC 3.13 (*) 3.87 - 5.11 MIL/uL   Hemoglobin 10.4 (*) 12.0 - 15.0 g/dL   HCT 31.1 (*) 36.0 - 46.0 %   MCV 99.4  78.0 - 100.0 fL   MCH 33.2  26.0 - 34.0 pg   MCHC 33.4  30.0 - 36.0 g/dL   RDW 14.8  11.5 - 15.5 %   Platelets 408 (*) 150 - 400 K/uL  CBC     Status: Abnormal   Collection Time    07/08/13  5:55 AM      Result Value Range   WBC 12.0 (*) 4.0 - 10.5 K/uL   RBC 3.07 (*) 3.87 - 5.11 MIL/uL   Hemoglobin 10.2 (*) 12.0 - 15.0 g/dL    HCT 30.6 (*) 36.0 - 46.0 %   MCV 99.7  78.0 - 100.0 fL   MCH 33.2  26.0 - 34.0 pg   MCHC 33.3  30.0 - 36.0 g/dL   RDW 14.5  11.5 - 15.5 %   Platelets 414 (*) 150 - 400 K/uL     HEENT: normal Cardio: irregular and no murmurs Resp: CTA B/L and unlabored GI: BS positive and non distended Extremity:  Pulses positive and No Edema Skin:   Intact and Wound C/D/I and ecchymosis around thigh lat right Neuro: Alert/Oriented, Flat, Cranial Nerve II-XII normal, Normal Sensory and Abnormal Motor 3-/5 R HF, KE, ankle DF and PF otherwise 4/5 throughout Musc/Skel:  Other Decreased AROM R hip Gen NAD   Assessment/Plan: 1. Functional deficits secondary to Right hip fracture after fall which require 3+ hours per day of interdisciplinary therapy in a comprehensive inpatient rehab setting. Physiatrist is providing close team supervision and 24 hour management of active medical problems listed below. Physiatrist and rehab team continue to assess barriers to discharge/monitor patient progress toward functional and medical goals.  FIM: FIM - Bathing Bathing Steps Patient Completed: Chest;Right Arm;Left Arm;Abdomen;Front perineal area;Right upper leg;Left upper leg;Buttocks;Right lower leg (including foot);Left lower leg (including foot) Bathing: 5:  Supervision: Safety issues/verbal cues  FIM - Upper Body Dressing/Undressing Upper body dressing/undressing steps patient completed: Thread/unthread right sleeve of pullover shirt/dresss;Thread/unthread left sleeve of pullover shirt/dress;Put head through opening of pull over shirt/dress;Pull shirt over trunk Upper body dressing/undressing: 5: Set-up assist to: Obtain clothing/put away FIM - Lower Body Dressing/Undressing Lower body dressing/undressing steps patient completed: Thread/unthread right underwear leg;Thread/unthread left underwear leg;Pull underwear up/down;Thread/unthread right pants leg;Thread/unthread left pants leg;Pull pants up/down;Don/Doff  left sock Lower body dressing/undressing: 3: Mod-Patient completed 50-74% of tasks  FIM - Toileting Toileting steps completed by patient: Adjust clothing prior to toileting;Performs perineal hygiene;Adjust clothing after toileting Toileting Assistive Devices: Grab bar or rail for support Toileting: 5: Supervision: Safety issues/verbal cues  FIM - Radio producer Devices: Elevated toilet seat;Grab bars;Walker Toilet Transfers: 5-From toilet/BSC: Supervision (verbal cues/safety issues);5-To toilet/BSC: Supervision (verbal cues/safety issues)  FIM - Bed/Chair Transfer Bed/Chair Transfer Assistive Devices: Bed rails;Arm rests Bed/Chair Transfer: 4: Chair or W/C > Bed: Min A (steadying Pt. > 75%);4: Bed > Chair or W/C: Min A (steadying Pt. > 75%)  FIM - Locomotion: Wheelchair Distance: 150 Locomotion: Wheelchair: 5: Travels 150 ft or more: maneuvers on rugs and over door sills with supervision, cueing or coaxing FIM - Locomotion: Ambulation Locomotion: Ambulation Assistive Devices: Administrator Ambulation/Gait Assistance: 4: Min assist;5: Supervision;4: Min guard Locomotion: Ambulation: 5: Travels 150 ft or more with supervision/safety issues  Comprehension Comprehension Mode: Auditory Comprehension: 5-Follows basic conversation/direction: With no assist  Expression Expression Mode: Verbal Expression: 5-Expresses basic needs/ideas: With no assist  Social Interaction Social Interaction: 5-Interacts appropriately 90% of the time - Needs monitoring or encouragement for participation or interaction.  Problem Solving Problem Solving: 5-Solves basic problems: With no assist  Memory Memory: 5-Recognizes or recalls 90% of the time/requires cueing < 10% of the time  Medical Problem List and Plan:  1. DVT Prophylaxis/Anticoagulation: Discussed need of anticoagulation with Dr. Alverda Skeans v/s benefits of blood thinners. He recommends changing patient to  eliquis (due to ETOH abuse hx) and to follow up in office for decision regarding need for long term anticoagulation.  2. Pain Management: Continue prn hydrocodone for now. Add tramadol additionally as needed.  3. H/o panic attacks/depression/Mood: Continue zoloft. Decrease ativan to 0.5 mg prn. LCSW to follow for evaluation.  4. Neuropsych: This patient is capable of making decisions on her own behalf.  5. Fluid overload: improved . Check daily weights. Add low salt restrictions. Encourage medication compliance.Off oxygen -follow daily I's and O's as well as weights  6. A fib with RVR: currently in NSR on amiodarone and lopressor. To decrease amiodarone to 200 mg bid starting 07/01/13 then 200 mg daily past one week. Start eliquis.  7. Non small-cell Lung Cancer: Discussion regarding treatment options past recovery per reports. To follow up with Dr. Humphrey Rolls past discharge.  8. Reactive Leucocytosis: Will monitor labs and fever curve 9. ABLA: Will add iron supplement, stable d/c daily CBC 10.  Dependant edema add TEDs 11.  Urinary frequency-check UA, May be mobilizing fluid 12. Hypokalemia: Due to diuretics. Will add daily supplement.  13. Alcohol abuse: No withdrawal signs reported. No recent urine tox, pt reported to PA 4 "large scotch and waters" per day, but reported no"heavy"  ETOH to me since 2002 14.  Insomnia, daytime anxiety improve, will reduce ativan and add ambien at noc LOS (Days) [place] EVALUATION WAS PERFORMED  KIRSTEINS,ANDREW E 07/09/2013, 8:01 AM

## 2013-07-09 NOTE — Progress Notes (Signed)
Social Work Patient ID: Stephanie Oliver, female   DOB: 1951/02/14, 63 y.o.   MRN: [id number] Met with pt, brothers and sister in-law to discuss discharge Tuesday.  Pt and sister in-law to begin calling agencies to hire a CNA. List given to both.  Pt plans to hire 24 hour care for two weeks.  Discussed DME and home health follow up.  Family has set up bed on first level and will Make more accessible.  Family plans to have someone here when pt meets with Oncologist.  Want appointment scheduled before leaving the hospital. Pt very pleased with plans and is looking forward to going home.

## 2013-07-09 NOTE — Progress Notes (Signed)
Physical Therapy Session Note  Patient Details  Name: Stephanie Oliver MRN: [id number] Date of Birth: [birth year]  Today's Date: 07/09/2013 Time: 11:10-12:05 (46mn)  And 14:45-15:30 (462m)   Short Term Goals: Week 1:  PT Short Term Goal 1 (Week 1): Pt will perform bed mobility with min assist. PT Short Term Goal 1 - Progress (Week 1): Met PT Short Term Goal 2 (Week 1): Pt will perform bed>< w/c transfer with mod assist, 1 person. PT Short Term Goal 2 - Progress (Week 1): Met PT Short Term Goal 3 (Week 1): Pt will propel w/c x 25' supervision. PT Short Term Goal 3 - Progress (Week 1): Met PT Short Term Goal 4 (Week 1): Pt will lock w/c brakes with 1 cue. PT Short Term Goal 4 - Progress (Week 1): Met PT Short Term Goal 5 (Week 1): Pt will perform gait x 15' with assistance of 1 person. PT Short Term Goal 5 - Progress (Week 1): Met Week 2:  PT Short Term Goal 1 (Week 2): Pt will perform bed mobility with S PT Short Term Goal 2 (Week 2): Pt will perform basic transfers with S  PT Short Term Goal 3 (Week 2): Pt willl ambulate 100' with S and RW in controlled setting PT Short Term Goal 4 (Week 2): Pt will ascend 6 stairs with 2 rails and S PT Short Term Goal 5 (Week 2): Pt willl demonstrate S level for dynamic balance during functional task  Skilled Therapeutic Interventions/Progress Updates:    Tx focused on therex for LE strength and ROM, gait with RW in controlled setting, and standing balance.  Pt with some c/o LLE discomfort, but able to perform all tasks.   Gait in controlled setting 2x120,' 1x50'  And 2x25' with Min A for steadying with RW and antalgic gait pattern, moderate UE reliance.   Seated and standing therex including each of the following 2x10 with 2# weight on LLE: marching, LAQ, standing hhel raises, marching, and HS curls.   Static standing and lateral weight shifting without UE support, in comfortable range x1m32meach. Ball toss without UE support x3mi73mpicking up object  from floor.   Stairs x5 with bil rails and min A and x5 with 1 rail and min A for all except for mod A at last step due to instability.   Car transfer with S only needed, pt using UEs to assist RLE.  [facility] in reach.   2:2 Pt participated in community gait and mobility 2x150' with RW and min-guard, progressing to close S over varying and uneven surfaces.  Pt able to navigate busy settings and tight spaces with S. Pt had no LOB, but needed 2 seated rest breaks. Performed transfers from varying surfaces and furniture with min a from low chair without arm rest, but S with standard height and arm rest. Pt needing cues for scapular depression during gait.   Pt propelled WC 2x150' with S and assist for parts management.   Supervision WC>bed transfer and S sit>supine with cues for RLE management.  Returned to room for supine therex as per HEP x10 each on R with cues for technique.   Brothers and sister-in-law arrived, and discussed home access and safety. Pt is well supported.    Therapy Documentation Precautions:  Precautions Precautions: Fall Restrictions Weight Bearing Restrictions: No RLE Weight Bearing: Weight bearing as tolerated General:   Vital Signs: Therapy Vitals Pulse Rate: 120 BP: 117/73 mmHg Patient Position, if  appropriate: Sitting Pain: Pain Assessment Pain Assessment: 0-10 Pain Score: 2  Pain Type: Surgical pain Pain Location: Leg Pain Orientation: Right Pain Descriptors / Indicators: Aching Pain Frequency: Intermittent Pain Onset: Gradual Patients Stated Pain Goal: 2 Pain Intervention(s): Medication (See eMAR) Multiple Pain Sites: No  See FIM for current functional status  Therapy/Group: Individual Therapy  Kennieth Rad, PT, DPT  07/09/2013, 11:47 AM

## 2013-07-09 NOTE — Progress Notes (Signed)
Occupational Therapy Session Note  Patient Details  Name: Stephanie Oliver MRN: [id number] Date of Birth: [birth year]  Today's Date: 07/09/2013 Time: 0321-2248 Time Calculation (min): 27 min  Skilled Therapeutic Interventions/Progress Updates:    Pt performed Nustep during session to help increased RLE strength for functional transfers.  She was able to transfer to the Nustep with min guard assist.  Initially performed the first 9 mins using both UEs and LEs on level 6.  Then performed 3 mins of just using the LEs with min assist to push down the right leg.  Finished with 2 mins of just using her arms on level 6 as well.  Transferred back to the wheelchair with min guard assist using the RW.  Pt needing min instructional cueing for hand placement with sit to stand.  Therapy Documentation Precautions:  Precautions Precautions: Fall Restrictions Weight Bearing Restrictions: No RLE Weight Bearing: Weight bearing as tolerated General:   Vital Signs:   Pain: Pain Assessment Pain Assessment: Faces Faces Pain Scale: Hurts a little bit Pain Type: Surgical pain Pain Location: Leg Pain Orientation: Right Pain Intervention(s): Medication (See eMAR);Repositioned ADL: See FIM for current functional status  Therapy/Group: Individual Therapy  Jilda Kress OTR/L 07/09/2013, 4:01 PM

## 2013-07-09 NOTE — Progress Notes (Deleted)
Placed patient on CPAP for the night.  

## 2013-07-09 NOTE — Progress Notes (Signed)
Social Work Patient ID: Stephanie Oliver, female   DOB: 04/30/1951, 63 y.o.   MRN: [id number] Long discussion with pt, two brothers and sister  In-law regarding the discharge plans and issues with this.  Pt has committed to hiring private duty caregivers for two weeks. She is aware of the team's recommendation of 24 hr supervision and her inability to care for grandson and dogs.  Family will follow up with this and make sure she follows through With the plan.  Informed not able to go to NH due to custodial care and too high level, she is more Assisted Living level.  She would rather go home and pay for caregivers than assisted living. Family to talk with pt's daughter and inform her of her need to find child care for her son.  Aware of the worker's concern that pt will dismiss her caregivers in a few days once home.  Sister in-law Will stay on top of this and be in communication with agency.  Family to talk with medical staff regarding questions they have.  Will work toward discharge Tuesday.

## 2013-07-09 NOTE — Progress Notes (Signed)
Occupational Therapy Session Note  Patient Details  Name: Stephanie Oliver MRN: [id number] Date of Birth: Nov 11, 1950  Today's Date: 07/09/2013 Time: 0900-1000 Time Calculation (min): 60 min  Short Term Goals: Week 1:  OT Short Term Goal 1 (Week 1): Pt will be able to transfer to toilet with mod assist using a stand pivot transfer. OT Short Term Goal 1 - Progress (Week 1): Met OT Short Term Goal 2 (Week 1): Pt will be able to come into a stand from toilet and maintain standing for 1 min with min assist for LB clothing management. OT Short Term Goal 2 - Progress (Week 1): Met OT Short Term Goal 3 (Week 1): Pt will be able to don her pants with mod assist. OT Short Term Goal 3 - Progress (Week 1): Met Week 2:  OT Short Term Goal 1 (Week 2): Pt will don socks and shoes with AE with set up/ supervision. OT Short Term Goal 2 (Week 2): Pt will transfer to tub bench in tub with supervision.  Skilled Therapeutic Interventions/Progress Updates:      Pt seen for BADL retraining of toileting, bathing, and dressing with a focus on functional mobility and standing balance.  Pt was on toilet at start of session and ambulated to shower and back to recliner in room.  Pt was able to bathe with supervision.  She used sock aid today so was able to dress LB with Supervision.  Pt ambulated around the room with RW to complete grooming at the sink with S. Pt was more independent and mobile today due to less hip pain.  Pt was taken to computer station to try to access her banking accounts. Pt independently found web site and logged in her information, but was not able to access due to security. Pt taken back to her room with call light in reach.  Therapy Documentation Precautions:  Precautions Precautions: Fall Restrictions Weight Bearing Restrictions: No RLE Weight Bearing: Weight bearing as tolerated     Pain: Pain Assessment Pain Score: 2  ADL:  See FIM for current functional status  Therapy/Group:  Individual Therapy  Jerret Mcbane 07/09/2013, 12:37 PM

## 2013-07-10 ENCOUNTER — Inpatient Hospital Stay (HOSPITAL_COMMUNITY): Admitting: Physical Therapy

## 2013-07-10 DIAGNOSIS — S72143A Displaced intertrochanteric fracture of unspecified femur, initial encounter for closed fracture: Secondary | ICD-10-CM

## 2013-07-10 DIAGNOSIS — W19XXXA Unspecified fall, initial encounter: Secondary | ICD-10-CM

## 2013-07-10 DIAGNOSIS — C3490 Malignant neoplasm of unspecified part of unspecified bronchus or lung: Secondary | ICD-10-CM

## 2013-07-10 NOTE — Progress Notes (Signed)
Physical Therapy Note  Patient Details  Name: Stephanie Oliver MRN: [id number] Date of Birth: Aug 25, 1950 Today's Date: 07/10/2013  1400 - 1455 (66 minutes) double Pain: 8/10 right hip / nurse notified/ meds given Focus of treatment: gait training; therapeutic exercises focused on strengthening RT LE Treatment: gait 64 feet , 80 feet RW SBA WBAT RT LE; up /down 2-3 steps with one rail min assist (pt recalls correct sequencing); standing Rt hip flexion X 10 with RW support.    Yared Barefoot,JIM 07/10/2013, 2:34 PM

## 2013-07-10 NOTE — Progress Notes (Signed)
Patient requested grounds pass. Notified Swartz,MD order given for grounds pass. Will continue with plan of care.

## 2013-07-10 NOTE — Progress Notes (Signed)
Subjective/Complaints: 63 y.o. female with history of alcohol abuse, depression with anxiety, smoker who fell on 06/18/13 with onset of pain and difficulty wight bearing on RLE and inability to walk. She was admitted on 06/21/13 for work up. sje was found to have right comminuted IT hip fracture as well as 7.2 x 5.0 x 6.4 cm necrotic right upper lobe mass (most consistent with primary lung malignancy) locally invasive to anterior mediastinum as well as enlarged right hilar node with smaller pericarnal nodes and additional sub-centimeter RUL pulmonary nodules. She underwent right hip IM nail fixation by Dr. Percell Miller as well as bronchoscopy with tracheobronchial needle aspiration of R-paratracheal mass. Bone biopsy of right hip negative for cancer. Cytology positive for non-small cell carcinoma. Dr. Humphrey Rolls consulted for input and recommends full workup and chemotherapy--patient not a surgical candidate. Final decision to be made after recovery and d/c to home.   No issues. Denies pain. Good night Review of Systems - otherwise negative as well  Objective: Vital Signs: Blood pressure 127/82, pulse 117, temperature 97.8 F (36.6 C), temperature source Oral, resp. rate 19, height 5\' 3"  (1.6 m), weight 57.7 kg (127 lb 3.3 oz), SpO2 92.00%. No results found. Results for orders placed during the hospital encounter of 06/29/13 (from the past 72 hour(s))  CBC     Status: Abnormal   Collection Time    07/08/13  5:55 AM      Result Value Range   WBC 12.0 (*) 4.0 - 10.5 K/uL   RBC 3.07 (*) 3.87 - 5.11 MIL/uL   Hemoglobin 10.2 (*) 12.0 - 15.0 g/dL   HCT 30.6 (*) 36.0 - 46.0 %   MCV 99.7  78.0 - 100.0 fL   MCH 33.2  26.0 - 34.0 pg   MCHC 33.3  30.0 - 36.0 g/dL   RDW 14.5  11.5 - 15.5 %   Platelets 414 (*) 150 - 400 K/uL     HEENT: normal Cardio: irregular and no murmurs Resp: CTA B/L and unlabored GI: BS positive and non distended Extremity:  Pulses positive and No Edema Skin:   Intact and Wound C/D/I  and ecchymosis around thigh lat right Neuro: Alert/Oriented, Flat, Cranial Nerve II-XII normal, Normal Sensory and Abnormal Motor 3-/5 R HF, KE, ankle DF and PF otherwise 4/5 throughout Musc/Skel:  Other Decreased AROM R hip Gen NAD   Assessment/Plan: 1. Functional deficits secondary to Right hip fracture after fall which require 3+ hours per day of interdisciplinary therapy in a comprehensive inpatient rehab setting. Physiatrist is providing close team supervision and 24 hour management of active medical problems listed below. Physiatrist and rehab team continue to assess barriers to discharge/monitor patient progress toward functional and medical goals.  FIM: FIM - Bathing Bathing Steps Patient Completed: Chest;Right Arm;Left Arm;Abdomen;Front perineal area;Right upper leg;Left upper leg;Buttocks;Right lower leg (including foot);Left lower leg (including foot) Bathing: 5: Supervision: Safety issues/verbal cues  FIM - Upper Body Dressing/Undressing Upper body dressing/undressing steps patient completed: Thread/unthread right sleeve of pullover shirt/dresss;Thread/unthread left sleeve of pullover shirt/dress;Put head through opening of pull over shirt/dress;Pull shirt over trunk Upper body dressing/undressing: 5: Set-up assist to: Obtain clothing/put away FIM - Lower Body Dressing/Undressing Lower body dressing/undressing steps patient completed: Thread/unthread right underwear leg;Thread/unthread left underwear leg;Pull underwear up/down;Thread/unthread right pants leg;Thread/unthread left pants leg;Pull pants up/down;Don/Doff left sock;Don/Doff right shoe Lower body dressing/undressing: 5: Supervision: Safety issues/verbal cues  FIM - Toileting Toileting steps completed by patient: Adjust clothing prior to toileting;Performs perineal hygiene;Adjust clothing after toileting [place]  Devices: Grab bar or rail for support Toileting: 5: Supervision: Safety issues/verbal cues  FIM  - Radio producer Devices: Elevated toilet seat;Grab bars;Walker Toilet Transfers: 5-From toilet/BSC: Supervision (verbal cues/safety issues);5-To toilet/BSC: Supervision (verbal cues/safety issues)  FIM - Engineer, site Assistive Devices: Arm rests;Walker Bed/Chair Transfer: 4: Bed > Chair or W/C: Min A (steadying Pt. > 75%);5: Chair or W/C > Bed: Supervision (verbal cues/safety issues);5: Sit > Supine: Supervision (verbal cues/safety issues)  FIM - Locomotion: Wheelchair Distance: 150 Locomotion: Wheelchair: 5: Travels 150 ft or more: maneuvers on rugs and over door sills with supervision, cueing or coaxing FIM - Locomotion: Ambulation Locomotion: Ambulation Assistive Devices: Administrator Ambulation/Gait Assistance: 5: Supervision Locomotion: Ambulation: 5: Travels 150 ft or more with supervision/safety issues  Comprehension Comprehension Mode: Auditory Comprehension: 5-Follows basic conversation/direction: With no assist  Expression Expression Mode: Verbal Expression: 5-Expresses basic needs/ideas: With extra time/assistive device  Social Interaction Social Interaction: 5-Interacts appropriately 90% of the time - Needs monitoring or encouragement for participation or interaction.  Problem Solving Problem Solving: 5-Solves basic problems: With no assist  Memory Memory: 5-Recognizes or recalls 90% of the time/requires cueing < 10% of the time  Medical Problem List and Plan:  1. DVT Prophylaxis/Anticoagulation: Discussed need of anticoagulation with Dr. Alverda Skeans v/s benefits of blood thinners. He recommends changing patient to eliquis (due to ETOH abuse hx) and to follow up in office for decision regarding need for long term anticoagulation.  2. Pain Management: Continue prn hydrocodone for now. Add tramadol additionally as needed.  3. H/o panic attacks/depression/Mood: Continue zoloft. Decrease ativan to 0.5 mg prn.  LCSW to follow for evaluation.  4. Neuropsych: This patient is capable of making decisions on her own behalf.  5. Fluid overload: improved . Check daily weights. Add low salt restrictions. Encourage medication compliance.Off oxygen -follow daily I's and O's as well as weights  6. A fib with RVR: currently in NSR on amiodarone and lopressor. To decrease amiodarone to 200 mg bid starting 07/01/13 then 200 mg daily past one week. Start eliquis.  7. Non small-cell Lung Cancer: Discussion regarding treatment options past recovery per reports. To follow up with Dr. Humphrey Rolls past discharge.  8. Reactive Leucocytosis: afebrile, 12k 9. ABLA: Fe++ 10.  Dependant edema:TEDs 11.  Urinary frequency-ua neg 12. Hypokalemia: Due to diuretics. Will add daily supplement.  13. Alcohol abuse: No withdrawal signs reported. No recent urine tox, pt reported to PA 4 "large scotch and waters" per day, but reported no"heavy"  ETOH to me since 2002 14.  Insomnia, daytime anxiety improve, reduced ativan and added ambien hs LOS (Days) 11 A FACE TO FACE EVALUATION WAS PERFORMED  Stalin Gruenberg T 07/10/2013, 8:05 AM

## 2013-07-11 ENCOUNTER — Inpatient Hospital Stay (HOSPITAL_COMMUNITY): Payer: No Typology Code available for payment source

## 2013-07-11 ENCOUNTER — Inpatient Hospital Stay (HOSPITAL_COMMUNITY): Admitting: Physical Therapy

## 2013-07-11 NOTE — Progress Notes (Signed)
Patient not ready to go on CPAP at this time.  RT will continue to monitor.

## 2013-07-11 NOTE — Progress Notes (Signed)
Physical Therapy Session Note  Patient Details  Name: Stephanie Oliver MRN: [id number] Date of Birth: [birth year]  Today's Date: 07/11/2013 Time: 9150-5697 Time Calculation (min): 40 min  Short Term Goals: Week 1:  PT Short Term Goal 1 (Week 1): Pt will perform bed mobility with min assist. PT Short Term Goal 1 - Progress (Week 1): Met PT Short Term Goal 2 (Week 1): Pt will perform bed>< w/c transfer with mod assist, 1 person. PT Short Term Goal 2 - Progress (Week 1): Met PT Short Term Goal 3 (Week 1): Pt will propel w/c x 25' supervision. PT Short Term Goal 3 - Progress (Week 1): Met PT Short Term Goal 4 (Week 1): Pt will lock w/c brakes with 1 cue. PT Short Term Goal 4 - Progress (Week 1): Met PT Short Term Goal 5 (Week 1): Pt will perform gait x 15' with assistance of 1 person. PT Short Term Goal 5 - Progress (Week 1): Met  Skilled Therapeutic Interventions/Progress Updates:  Pt was seen bedside in the pm. Pt transferred edge of bed to w.c with rolling walker and S. Pt propelled w/c to gym with S and B UEs. Performed LE exercises for strengthening and increasing flexibility. Pt ambulated with rolling walker, 90 feet x 2 with S and verbal cues. Pt returned to room propelling w/c with B UEs. Pt transferred w/c to edge of bed with S and rolling walker.   Therapy Documentation Precautions:  Precautions Precautions: Fall Restrictions Weight Bearing Restrictions: Yes RLE Weight Bearing: Weight bearing as tolerated General:   Pain: Pt c/o mod pain L hip, medicated prior to therapy.    Locomotion : Ambulation Ambulation/Gait Assistance: 5: Supervision   See FIM for current functional status  Therapy/Group: Individual Therapy  Dub Amis 07/11/2013, 3:51 PM

## 2013-07-11 NOTE — Progress Notes (Signed)
Subjective/Complaints: 63 y.o. female with history of alcohol abuse, depression with anxiety, smoker who fell on 06/18/13 with onset of pain and difficulty wight bearing on RLE and inability to walk. She was admitted on 06/21/13 for work up. sje was found to have right comminuted IT hip fracture as well as 7.2 x 5.0 x 6.4 cm necrotic right upper lobe mass (most consistent with primary lung malignancy) locally invasive to anterior mediastinum as well as enlarged right hilar node with smaller pericarnal nodes and additional sub-centimeter RUL pulmonary nodules. She underwent right hip IM nail fixation by Dr. Percell Miller as well as bronchoscopy with tracheobronchial needle aspiration of R-paratracheal mass. Bone biopsy of right hip negative for cancer. Cytology positive for non-small cell carcinoma. Dr. Humphrey Rolls consulted for input and recommends full workup and chemotherapy--patient not a surgical candidate. Final decision to be made after recovery and d/c to home.   No problems. Feels well. Slow day yesterday Review of Systems - otherwise negative as well  Objective: Vital Signs: Blood pressure 139/82, pulse 90, temperature 97.9 F (36.6 C), temperature source Oral, resp. rate 18, height 5\' 3"  (1.6 m), weight 54.9 kg (121 lb 0.5 oz), SpO2 96.00%. No results found. No results found for this or any previous visit (from the past 72 hour(s)).   HEENT: normal Cardio: irregular and no murmurs Resp: CTA B/L and unlabored GI: BS positive and non distended Extremity:  Pulses positive and No Edema Skin:   Intact and Wound C/D/I with steristrips stuck to back of allevyn dressing Neuro: Alert/Oriented, Flat, Cranial Nerve II-XII normal, Normal Sensory and Abnormal Motor 3-/5 R HF, KE, ankle DF and PF otherwise 4/5 throughout Musc/Skel:  Pain with PROM/ AROM R hip Gen NAD   Assessment/Plan: 1. Functional deficits secondary to Right hip fracture after fall which require 3+ hours per day of interdisciplinary  therapy in a comprehensive inpatient rehab setting. Physiatrist is providing close team supervision and 24 hour management of active medical problems listed below. Physiatrist and rehab team continue to assess barriers to discharge/monitor patient progress toward functional and medical goals.  FIM: FIM - Bathing Bathing Steps Patient Completed: Chest;Right Arm;Left Arm;Abdomen;Front perineal area;Right upper leg;Left upper leg;Buttocks;Right lower leg (including foot);Left lower leg (including foot) Bathing: 5: Supervision: Safety issues/verbal cues  FIM - Upper Body Dressing/Undressing Upper body dressing/undressing steps patient completed: Thread/unthread right sleeve of pullover shirt/dresss;Thread/unthread left sleeve of pullover shirt/dress;Put head through opening of pull over shirt/dress;Pull shirt over trunk Upper body dressing/undressing: 5: Set-up assist to: Obtain clothing/put away FIM - Lower Body Dressing/Undressing Lower body dressing/undressing steps patient completed: Thread/unthread right underwear leg;Thread/unthread left underwear leg;Pull underwear up/down;Thread/unthread right pants leg;Thread/unthread left pants leg;Pull pants up/down;Don/Doff left sock;Don/Doff right shoe Lower body dressing/undressing: 5: Supervision: Safety issues/verbal cues  FIM - Toileting Toileting steps completed by patient: Adjust clothing prior to toileting;Performs perineal hygiene;Adjust clothing after toileting Toileting Assistive Devices: Grab bar or rail for support Toileting: 5: Supervision: Safety issues/verbal cues  FIM - Radio producer Devices: Grab bars;Elevated toilet seat Toilet Transfers: 5-To toilet/BSC: Supervision (verbal cues/safety issues);5-From toilet/BSC: Supervision (verbal cues/safety issues)  FIM - Engineer, site Assistive Devices: Arm rests;Walker Bed/Chair Transfer: 4: Bed > Chair or W/C: Min A (steadying Pt. >  75%);5: Chair or W/C > Bed: Supervision (verbal cues/safety issues);5: Sit > Supine: Supervision (verbal cues/safety issues)  FIM - Locomotion: Wheelchair Distance: 150 Locomotion: Wheelchair: 5: Travels 150 ft or more: maneuvers on rugs and over door sills with supervision, cueing or  coaxing FIM - Locomotion: Ambulation Locomotion: Ambulation Assistive Devices: Administrator Ambulation/Gait Assistance: 5: Supervision Locomotion: Ambulation: 5: Travels 150 ft or more with supervision/safety issues  Comprehension Comprehension Mode: Auditory Comprehension: 5-Follows basic conversation/direction: With extra time/assistive device  Expression Expression Mode: Verbal Expression: 5-Expresses basic needs/ideas: With extra time/assistive device  Social Interaction Social Interaction: 5-Interacts appropriately 90% of the time - Needs monitoring or encouragement for participation or interaction.  Problem Solving Problem Solving: 5-Solves basic problems: With no assist  Memory Memory: 5-Recognizes or recalls 90% of the time/requires cueing < 10% of the time  Medical Problem List and Plan:  1. DVT Prophylaxis/Anticoagulation: Discussed need of anticoagulation with Dr. Alverda Skeans v/s benefits of blood thinners. He recommends changing patient to eliquis (due to ETOH abuse hx) and to follow up in office for decision regarding need for long term anticoagulation.  2. Pain Management: Continue prn hydrocodone for now. Add tramadol additionally as needed.  3. H/o panic attacks/depression/Mood: Continue zoloft. Decrease ativan to 0.5 mg prn. LCSW to follow for evaluation.  4. Neuropsych: This patient is capable of making decisions on her own behalf.  5. Fluid overload: improved . Check daily weights. Add low salt restrictions. Encourage medication compliance.Off oxygen -follow daily I's and O's as well as weights  6. A fib with RVR: currently in NSR on amiodarone and lopressor. To decrease  amiodarone to 200 mg bid starting 07/01/13 then 200 mg daily past one week. Start eliquis.  7. Non small-cell Lung Cancer: Discussion regarding treatment options past recovery per reports. To follow up with Dr. Humphrey Rolls past discharge.  8. Reactive Leucocytosis: afebrile, 12k 9. ABLA: Fe++ 10.  Dependant edema:TEDs 11.  Urinary frequency-ua neg 12. Hypokalemia: Due to diuretics. Daily supp.  13. Alcohol abuse: No withdrawal signs reported. No recent urine tox, pt reported to PA 4 "large scotch and waters" per day, but reported no"heavy"  ETOH to me since 2002 14.  Insomnia, daytime anxiety improve, reduced ativan and added ambien hs LOS (Days) 12 A FACE TO FACE EVALUATION WAS PERFORMED  Stephanie Oliver T 07/11/2013, 7:47 AM

## 2013-07-12 ENCOUNTER — Encounter: Payer: Self-pay | Admitting: *Deleted

## 2013-07-12 ENCOUNTER — Inpatient Hospital Stay (HOSPITAL_COMMUNITY)

## 2013-07-12 ENCOUNTER — Inpatient Hospital Stay (HOSPITAL_COMMUNITY): Admitting: Occupational Therapy

## 2013-07-12 DIAGNOSIS — S72143A Displaced intertrochanteric fracture of unspecified femur, initial encounter for closed fracture: Secondary | ICD-10-CM

## 2013-07-12 DIAGNOSIS — W19XXXA Unspecified fall, initial encounter: Secondary | ICD-10-CM

## 2013-07-12 DIAGNOSIS — C3490 Malignant neoplasm of unspecified part of unspecified bronchus or lung: Secondary | ICD-10-CM

## 2013-07-12 MED ORDER — DICLOFENAC SODIUM 1 % TD GEL
2.0000 g | Freq: Four times a day (QID) | TRANSDERMAL | Status: DC
  Administered 2013-07-12 – 2013-07-13 (×4): 2 g via TOPICAL
  Filled 2013-07-12: qty 100

## 2013-07-12 NOTE — Progress Notes (Addendum)
Occupational Therapy Session Note  Patient Details  Name: Stephanie Oliver MRN: [id number] Date of Birth: Feb 05, 1951  Today's Date: 07/12/2013 Time:  - 11:16-12:00    Short Term Goals: Week 1:  OT Short Term Goal 1 (Week 1): Pt will be able to transfer to toilet with mod assist using a stand pivot transfer. OT Short Term Goal 1 - Progress (Week 1): Met OT Short Term Goal 2 (Week 1): Pt will be able to come into a stand from toilet and maintain standing for 1 min with min assist for LB clothing management. OT Short Term Goal 2 - Progress (Week 1): Met OT Short Term Goal 3 (Week 1): Pt will be able to don her pants with mod assist. OT Short Term Goal 3 - Progress (Week 1): Met  Skilled Therapeutic Interventions/Progress Updates:    1:1 Pt reported she had already bathed and dressed and felt comfortable with her abilities to perform these tasks. Pt agreed to don socks with sock aide with instructional cues for process and required A to don right shoe and fasten it due to pain. Pt able to perform functional ambulation but only for short distances due to right knee pain. Discussed option for w/c at d/c due to pain and limited functional distance. Pt able to perform Nustep for 10 min with goal of BORG score of 13 but only able to achieve an 11. Performed functional ambulation with RW into bathroom, toileting and toilet transfer with mod I.   Therapy Documentation Precautions:  Precautions Precautions: Fall Restrictions Weight Bearing Restrictions: Yes RLE Weight Bearing: Weight bearing as tolerated Pain: Pain Assessment Pain Score: 9  Pain Type: Acute pain Pain Location: Hip (new pain medial R knee; MD aware) Pain Orientation: Right RN aware   See FIM for current functional status  Therapy/Group: Individual Therapy  [facility] 07/12/2013, 11:47 AM

## 2013-07-12 NOTE — Progress Notes (Signed)
Occupational Therapy Session Note  Patient Details  Name: Stephanie Oliver MRN: [id number] Date of Birth: [birth year]  Today's Date: 07/12/2013 Time: 1400-1430 Time Calculation (min): 30 min  Short Term Goals: Week 2:  OT Short Term Goal 1 (Week 2): Pt will don socks and shoes with AE with set up/ supervision. OT Short Term Goal 2 (Week 2): Pt will transfer to tub bench in tub with supervision.  Skilled Therapeutic Interventions/Progress Updates:  Patient resting in w/c upon arrival and requested to go to hospital gift shop.  Reminded patient that a w/c is not recommended by PT to patient will be ambulating once we get down to the gift shop and she agrees.  The first ~15 feet ambulating with RW patient does not indicate pain then she asked if w/c could be removed closer to the door.  After several more steps patient unable to continue and w/c was placed behind her.  She continued her shopping and purchase from a w/c level then she was pushed back to her room.  Reviewed concern for decline in functional mobility and discharge home tomorrow.  Also reviewed with patient that the rehab team recommends that patient have 24/7 supervision/assistance and patient responded with , "I'm going to have an aide come in from 8-12 each day".  Therapy Documentation Precautions:  Precautions Precautions: Fall Restrictions Weight Bearing Restrictions: Yes RLE Weight Bearing: Weight bearing as tolerated Pain: Right knee pain 6/10 at rest, 10/10 when ambulating after ~15 feet. Rest and repositioned.  Therapy/Group: Individual Therapy  Akeylah Hendel, [place] 07/12/2013, 5:01 PM

## 2013-07-12 NOTE — Progress Notes (Signed)
Physical Therapy Session Note  Patient Details  Name: Stephanie Oliver MRN: [id number] Date of Birth: January 29, 1951  Today's Date: 07/12/2013 Time:0800-0900 and  1700-1749 Time Calculation (min): 50 min and 65 min  Skilled Therapeutic Interventions/Progress Updates:  Pain rated 8/10, premedicated Tx 1: simulated car transfer to SUV height seat with supervision; NuSTep at level 4 x 8 minutes and bil bridging for flexibility and strengthening; up/down 6 (7") steps, R rail, gait on level tile, bed mobility in flat bed without rails. Basic transfers with and without AD, supervision cues for safety; w/c propulsion x 150' modified independent for activity tolerance.  Gait limited by new R medial knee pain at joint line- MD aware and believes this is OA; he will order med.  Tx 2: pain rated 6/10 R hip, premedicated (topical for R medial knee).  Therapeutic exercise performed with LE to increase strength for functional mobility: marching in sitting, L long arc knee ext with ankle pumps at end range, R ankle pumps with knee flexed to reduce stress on R knee.  Pt unable to ambulate easily due to R knee pain; ACE wrapped with good results; x 150' with supervision.   Gait up/down ramp with supervision, curb with min guard assist.  Dynamic standing activity using L hand reaching down and to L for item, then transferring weight to bil LEs to hand item on basketball hoop, mod HHA for balance x 5; limited by RLE muscle spasm/pain.  Pt returned to room; ice packs applied to R knee and R hip; instructed pt in use of ice packs at home, at least 2x/day for 20 minutes.    Therapy Documentation Precautions:  Precautions Precautions: Fall Restrictions Weight Bearing Restrictions: Yes RLE Weight Bearing: Weight bearing as tolerated   Locomotion : Ambulation Ambulation: Yes Ambulation/Gait Assistance: 5: Supervision Ambulation Distance (Feet): 150 Feet Assistive device: Rolling walker Ambulation/Gait Assistance Details:  Verbal cues for technique;Verbal cues for precautions/safety Gait Gait: Yes Stairs / Additional Locomotion Stairs: Yes Stairs Assistance: 4: Min guard Stair Management Technique: One rail Right;Step to pattern;Sideways Number of Stairs: 6 (in stairwell) Height of Stairs: 7 Ramp: 5: Supervision Curb: 4: Min Chemical engineer: Yes Wheelchair Assistance: 5: Careers information officer: Both upper extremities Wheelchair Parts Management: Needs assistance Distance: 150     Balance: Balance Balance Assessed: Yes Static Sitting Balance Static Sitting - Level of Assistance: 6: Modified independent (Device/Increase time) Dynamic Sitting Balance Dynamic Sitting - Level of Assistance: 6: Modified independent (Device/Increase time) Static Standing Balance Static Standing - Level of Assistance: 4: Min assist Dynamic Standing Balance Dynamic Standing - Level of Assistance: 4: Min assist (reaching within BOS with L hand during functional activity)    See FIM for current functional status  Therapy/Group: Individual Therapy  Stephanie Oliver 07/12/2013, 4:20 PM

## 2013-07-12 NOTE — Progress Notes (Addendum)
Subjective/Complaints: 63 y.o. female with history of alcohol abuse, depression with anxiety, smoker who fell on 06/18/13 with onset of pain and difficulty wight bearing on RLE and inability to walk. She was admitted on 06/21/13 for work up. sje was found to have right comminuted IT hip fracture as well as 7.2 x 5.0 x 6.4 cm necrotic right upper lobe mass (most consistent with primary lung malignancy) locally invasive to anterior mediastinum as well as enlarged right hilar node with smaller pericarnal nodes and additional sub-centimeter RUL pulmonary nodules. She underwent right hip IM nail fixation by Dr. Percell Miller as well as bronchoscopy with tracheobronchial needle aspiration of R-paratracheal mass. Bone biopsy of right hip negative for cancer. Cytology positive for non-small cell carcinoma. Dr. Humphrey Rolls consulted for input and recommends full workup and chemotherapy--patient not a surgical candidate. Final decision to be made after recovery and d/c to home.  Right knee pain, no trauma other than original fall with hip fracture Review of Systems - otherwise negative as well  Objective: Vital Signs: Blood pressure 116/69, pulse 109, temperature 98.7 F (37.1 C), temperature source Oral, resp. rate 18, height 5\' 3"  (1.6 m), weight 54.2 kg (119 lb 7.8 oz), SpO2 95.00%. No results found. No results found for this or any previous visit (from the past 72 hour(s)).   HEENT: normal Cardio: irregular and no murmurs Resp: CTA B/L and unlabored GI: BS positive and non distended Extremity:  Pulses positive and No Edema Skin:   Intact and Wound C/D/I with steristrips stuck to back of allevyn dressing Neuro: Alert/Oriented, Flat, Cranial Nerve II-XII normal, Normal Sensory and Abnormal Motor 3-/5 R HF, KE, ankle DF and PF otherwise 4/5 throughout Musc/Skel:  Pain with PROM/ AROM R hip, R medial joint line tenderness no erythema or effusion Gen NAD   Assessment/Plan: 1. Functional deficits secondary to Right  hip fracture after fall which require 3+ hours per day of interdisciplinary therapy in a comprehensive inpatient rehab setting. Physiatrist is providing close team supervision and 24 hour management of active medical problems listed below. Physiatrist and rehab team continue to assess barriers to discharge/monitor patient progress toward functional and medical goals.  FIM: FIM - Bathing Bathing Steps Patient Completed: Chest;Right Arm;Left Arm;Abdomen;Front perineal area;Buttocks;Right upper leg;Left upper leg;Right lower leg (including foot);Left lower leg (including foot) Bathing: 5: Supervision: Safety issues/verbal cues  FIM - Upper Body Dressing/Undressing Upper body dressing/undressing steps patient completed: Thread/unthread right sleeve of pullover shirt/dresss;Thread/unthread left sleeve of pullover shirt/dress;Put head through opening of pull over shirt/dress;Pull shirt over trunk Upper body dressing/undressing: 5: Set-up assist to: Obtain clothing/put away FIM - Lower Body Dressing/Undressing Lower body dressing/undressing steps patient completed: Thread/unthread right underwear leg;Thread/unthread left underwear leg;Pull underwear up/down;Thread/unthread right pants leg;Thread/unthread left pants leg;Pull pants up/down;Don/Doff right sock;Don/Doff left sock;Don/Doff right shoe;Don/Doff left shoe Lower body dressing/undressing: 5: Supervision: Safety issues/verbal cues  FIM - Toileting Toileting steps completed by patient: Adjust clothing prior to toileting;Performs perineal hygiene;Adjust clothing after toileting Toileting Assistive Devices: Grab bar or rail for support Toileting: 5: Supervision: Safety issues/verbal cues  FIM - Radio producer Devices: Grab bars;Elevated toilet seat Toilet Transfers: 5-To toilet/BSC: Supervision (verbal cues/safety issues);5-From toilet/BSC: Supervision (verbal cues/safety issues)  FIM - Electrical engineer Assistive Devices: Arm rests;Walker Bed/Chair Transfer: 5: Chair or W/C > Bed: Supervision (verbal cues/safety issues);5: Bed > Chair or W/C: Supervision (verbal cues/safety issues)  FIM - Locomotion: Wheelchair Distance: 150 Locomotion: Wheelchair: 5: Travels 150 ft or more: maneuvers on rugs  and over door sills with supervision, cueing or coaxing FIM - Locomotion: Ambulation Locomotion: Ambulation Assistive Devices: Walker - Rolling Ambulation/Gait Assistance: 5: Supervision Locomotion: Ambulation: 2: Travels 50 - 149 ft with supervision/safety issues  Comprehension Comprehension Mode: Auditory Comprehension: 5-Follows basic conversation/direction: With extra time/assistive device  Expression Expression Mode: Verbal Expression: 5-Expresses basic needs/ideas: With extra time/assistive device  Social Interaction Social Interaction: 5-Interacts appropriately 90% of the time - Needs monitoring or encouragement for participation or interaction.  Problem Solving Problem Solving: 5-Solves basic problems: With no assist  Memory Memory: 5-Recognizes or recalls 90% of the time/requires cueing < 10% of the time  Medical Problem List and Plan:  1. DVT Prophylaxis/Anticoagulation: Discussed need of anticoagulation with Dr. Alverda Skeans v/s benefits of blood thinners. He recommends changing patient to eliquis (due to ETOH abuse hx) and to follow up in office for decision regarding need for long term anticoagulation.  2. Pain Management: Continue prn hydrocodone for now. Add tramadol additionally as needed.  3. H/o panic attacks/depression/Mood: Continue zoloft. Decrease ativan to 0.5 mg prn. LCSW to follow for evaluation.  4. Neuropsych: This patient is capable of making decisions on her own behalf.  5. Fluid overload: improved . Check daily weights. Add low salt restrictions. Encourage medication compliance.Off oxygen -follow daily I's and O's as well as weights  6. A fib with RVR:  currently in NSR on amiodarone and lopressor. To decrease amiodarone to 200 mg bid starting 07/01/13 then 200 mg daily past one week. Start eliquis.  7. Non small-cell Lung Cancer: Discussion regarding treatment options past recovery per reports. To follow up with Dr. Humphrey Rolls past discharge.  8. Reactive Leucocytosis: afebrile, 12k 9. ABLA: Fe++ 10.  Dependant edema:TEDs 11.  Urinary frequency-ua neg 12. Hypokalemia: Due to diuretics. Daily supp.  13. Alcohol abuse: No withdrawal signs reported. No recent urine tox, pt reported to PA 4 "large scotch and waters" per day, but reported no"heavy"  ETOH to me since 2002 14.  Insomnia, daytime anxiety improve, reduced ativan and added ambien hs LOS (Days) 13 A FACE TO FACE EVALUATION WAS PERFORMED  KIRSTEINS,ANDREW E 07/12/2013, 8:07 AM

## 2013-07-12 NOTE — Consult Note (Signed)
NEUROCOGNITIVE STATUS EXAMINATION - [name]   Stephanie Oliver is a 63 year old woman, with history of alcohol abuse, depression, anxiety and cigarette smoking, who was seen for a neurocognitive status examination to evaluate her emotional state and mental status.  According to her medical record, she was admitted on 06/21/13.  She reportedly fell on 06/18/13 with onset of pain and difficulty bearing weight on her right leg and ultimately had inability to walk.  Workup was conducted upon admission revealing a hip fracture and primary lung malignancy.  She underwent bronchoscopy with tracheobronchial needle aspiration of mass.  Chemotherapy was recommended.  According to the treatment team, she has reportedly had times of cognitive disturbance, though it was unclear whether her confusion was related to detoxification from alcohol or if it will be persistent.  In addition, there is reportedly family stress.  Neuropsychological consult was requested to determine the nature and extent of both cognitive impairments and emotional disturbance.    Emotional Functioning:  During the clinical interview, Stephanie Oliver denied experience of depression or anxiety, but acknowledged stress surrounding her relationship with her daughter as well as worry over what will happen to her dogs.  She was unwilling to engage in discussions regarding anticipation of possible upcoming hurdles and instead stated that she will worry about such situations when and if they occur.  Of note, she reported that prior to her hospitalization, she was consuming 4 alcoholic beverages per day, though she did not feel as though she is an alcoholic, because she was not experiencing physiological effects of liquor due to spacing her drinks out over the course of the afternoon and evening.  She mentioned that she has been told to refrain from use of alcohol after discharge, but did not say that she was planning to  follow through on that recommendation.  She did, however, say that she does not have a desire to smoke cigarettes anymore and is confident in her ability to abstain from cigarette use when she is home.    Ms. Brinlee responses to self-report measures of mood symptoms were not suggestive of the presence of clinically significant depression or anxiety at this time.    Mental Status:  Stephanie Oliver total score on an overall measure of mental status was suggestive of the presence of significant cognitive impairment at a clinical level (MoCA = 23/30).  She lost most points for executive dysfunction (e.g. trouble with set-shifting, visuospatial planning, letter fluency, and abstract reasoning).  She also lost one point for inability to accurately repeat 1 of 2 sentences and two points for inability to freely recall 2 of 5 previously studied words after a delay.  Subjectively, she mentioned that her memory has been poor for several months, though she felt like the cognitive changes that she had noticed were secondary to normal aging.    Impressions and Recommendations:  Ms. Ledesma overall neurocognitive functioning seems to be impaired, at a clinical level.   Most of her difficulties surrounded executive functioning, which can be seen in the wake of alcohol detoxification.  Executive functioning changes after alcoholism can persist for 6 months in some, with permanent changes in a few.  Although Ms. Coppess denied experiencing physiological effects from alcohol use, her history seems consistent with alcohol dependence and therefore, her current cognitive deficits are likely secondary to detoxification from alcohol.  We cannot definitively rule out contribution from a pre-existing condition (e.g. early stages of a neurodegenerative disease process), particularly given her report of  memory problems over the past several months.  Therefore, comprehensive neuropsychological testing could be performed in 6 months, if her  cognitive symptoms persist.  In the meantime, she will likely benefit from increased structure in her daily routine, provision of concrete instructions, and set schedules made ahead of time.  It should also be noted that while there is no evidence of clinically significant depression or anxiety, that her cognitive functioning will likely be worse during times of stress.    DIAGNOSES: Acute encephalopathy Alcohol abuse  Marlane Hatcher, Psy.D.  Clinical Neuropsychologist

## 2013-07-12 NOTE — Progress Notes (Signed)
Social Work Patient ID: Stephanie Oliver, female   DOB: 1950/06/28, 63 y.o.   MRN: [id number] Been asked by PT to have daughter come in at 10;00 instead of 11;00, have left message on daughter's phone since she is at work. Pt aware of Oncologist appt Thurs.  Have ordered DME to deliver to pt's room.  Pt ready to go home tomorrow.  Hopefully it will go well with caregiver there.

## 2013-07-12 NOTE — Progress Notes (Signed)
Called and spoke with pt nurse on rehab.  She states pt will be discharged tomorrow.  I have her an appt to see Dr. Julien Nordmann.  07/15/13 at 2:00 arrive at 1:45.  She stated she would give pt the appt time and place.

## 2013-07-12 NOTE — Progress Notes (Signed)
Social Work Patient ID: Stephanie Oliver, female   DOB: Jul 04, 1950, 63 y.o.   MRN: [id number] Met with pt who reports she has hired Engineer, manufacturing for her private duty caregivers.  Trying to get daughter of Mom to come in for family training today or tomorrow am. Home health set up and will contact once home to come out and see.

## 2013-07-12 NOTE — Progress Notes (Signed)
Social Work Patient ID: Stephanie Oliver, female   DOB: 1950-07-26, 63 y.o.   MRN: [id number] Have scheduled family education for tomorrow at 24;00 with daughter she has to work today and can not come in at 2;30pm. Have completed new patient form for Dr Laurelyn Sickle office trying to schedule appointment prior to discharge tomorrow.

## 2013-07-12 NOTE — Progress Notes (Signed)
Norton Blizzard, the Thoracic Coordinator with [place] [facility] called to schedule appointment for Stephanie Oliver with Dr. Julien Nordmann for this Thursday January 29th at 1:45.  Gave message with appointment time and place to the patient with no further questions.  Brita Romp, RN

## 2013-07-13 ENCOUNTER — Encounter (HOSPITAL_COMMUNITY): Payer: No Typology Code available for payment source | Admitting: Occupational Therapy

## 2013-07-13 ENCOUNTER — Inpatient Hospital Stay (HOSPITAL_COMMUNITY)

## 2013-07-13 ENCOUNTER — Ambulatory Visit (HOSPITAL_COMMUNITY): Payer: No Typology Code available for payment source | Admitting: *Deleted

## 2013-07-13 DIAGNOSIS — D62 Acute posthemorrhagic anemia: Secondary | ICD-10-CM | POA: Diagnosis present

## 2013-07-13 DIAGNOSIS — D72829 Elevated white blood cell count, unspecified: Secondary | ICD-10-CM | POA: Diagnosis present

## 2013-07-13 MED ORDER — POLYSACCHARIDE IRON COMPLEX 150 MG PO CAPS: 150.0000 mg | ORAL_CAPSULE | Freq: Every day | ORAL | Status: AC

## 2013-07-13 MED ORDER — POTASSIUM CHLORIDE CRYS ER 20 MEQ PO TBCR: 20.0000 meq | EXTENDED_RELEASE_TABLET | Freq: Two times a day (BID) | ORAL | Status: DC

## 2013-07-13 MED ORDER — APIXABAN 5 MG PO TABS: 5.0000 mg | ORAL_TABLET | Freq: Two times a day (BID) | ORAL | Status: AC

## 2013-07-13 MED ORDER — METOPROLOL TARTRATE 25 MG PO TABS: 25.0000 mg | ORAL_TABLET | Freq: Two times a day (BID) | ORAL | Status: DC

## 2013-07-13 MED ORDER — AMIODARONE HCL 200 MG PO TABS: 200.0000 mg | ORAL_TABLET | Freq: Every day | ORAL | Status: DC

## 2013-07-13 MED ORDER — DICLOFENAC SODIUM 1 % TD GEL: 2.0000 g | Freq: Four times a day (QID) | TRANSDERMAL | Status: DC

## 2013-07-13 MED ORDER — FUROSEMIDE 40 MG PO TABS: 40.0000 mg | ORAL_TABLET | Freq: Every day | ORAL | Status: DC

## 2013-07-13 MED ORDER — FOLIC ACID 1 MG PO TABS: 1.0000 mg | ORAL_TABLET | Freq: Every day | ORAL | Status: AC

## 2013-07-13 MED ORDER — METHOCARBAMOL 500 MG PO TABS: 500.0000 mg | ORAL_TABLET | Freq: Four times a day (QID) | ORAL | Status: DC | PRN

## 2013-07-13 MED ORDER — HYDROCODONE-ACETAMINOPHEN 5-325 MG PO TABS: 1.0000 | ORAL_TABLET | Freq: Four times a day (QID) | ORAL | Status: DC | PRN

## 2013-07-13 MED ORDER — THIAMINE HCL 100 MG PO TABS: 100.0000 mg | ORAL_TABLET | Freq: Every day | ORAL | Status: AC

## 2013-07-13 NOTE — Progress Notes (Signed)
Subjective/Complaints: 63 y.o. female with history of alcohol abuse, depression with anxiety, smoker who fell on 06/18/13 with onset of pain and difficulty wight bearing on RLE and inability to walk. She was admitted on 06/21/13 for work up. sje was found to have right comminuted IT hip fracture as well as 7.2 x 5.0 x 6.4 cm necrotic right upper lobe mass (most consistent with primary lung malignancy) locally invasive to anterior mediastinum as well as enlarged right hilar node with smaller pericarnal nodes and additional sub-centimeter RUL pulmonary nodules. She underwent right hip IM nail fixation by Dr. Percell Miller as well as bronchoscopy with tracheobronchial needle aspiration of R-paratracheal mass. Bone biopsy of right hip negative for cancer. Cytology positive for non-small cell carcinoma. Dr. Humphrey Rolls consulted for input and recommends full workup and chemotherapy--patient not a surgical candidate. Final decision to be made after recovery and d/c to home.  Right knee pain,mainly with activity Review of Systems - otherwise negative as well  Objective: Vital Signs: Blood pressure 117/78, pulse 108, temperature 97.9 F (36.6 C), temperature source Oral, resp. rate 20, height 5\' 3"  (1.6 m), weight 57.8 kg (127 lb 6.8 oz), SpO2 96.00%. No results found. No results found for this or any previous visit (from the past 72 hour(s)).   HEENT: normal Cardio: irregular and no murmurs Resp: CTA B/L and unlabored GI: BS positive and non distended Extremity:  Pulses positive and No Edema Skin:   Intact and Wound C/D/I with steristrips stuck to back of allevyn dressing Neuro: Alert/Oriented, Flat, Cranial Nerve II-XII normal, Normal Sensory and Abnormal Motor 3-/5 R HF, KE, ankle DF and PF otherwise 4/5 throughout Musc/Skel:  Pain with PROM/ AROM R hip, R medial joint line tenderness no erythema or effusion, fading ecchymotic area over R patella non tender Gen NAD   Assessment/Plan: 1. Functional deficits  secondary to Right hip fracture after fall which require 3+ hours per day of interdisciplinary therapy in a comprehensive inpatient rehab setting.  Check R knee xray prior to d/c, if no fracture then is stable for d/c  FIM: FIM - Bathing Bathing Steps Patient Completed: Chest;Right Arm;Left Arm;Abdomen;Front perineal area;Buttocks;Right upper leg;Left upper leg;Right lower leg (including foot);Left lower leg (including foot) Bathing: 5: Supervision: Safety issues/verbal cues  FIM - Upper Body Dressing/Undressing Upper body dressing/undressing steps patient completed: Thread/unthread right sleeve of pullover shirt/dresss;Thread/unthread left sleeve of pullover shirt/dress;Put head through opening of pull over shirt/dress;Pull shirt over trunk Upper body dressing/undressing: 5: Set-up assist to: Obtain clothing/put away FIM - Lower Body Dressing/Undressing Lower body dressing/undressing steps patient completed: Thread/unthread right underwear leg;Thread/unthread left underwear leg;Pull underwear up/down;Thread/unthread right pants leg;Thread/unthread left pants leg;Pull pants up/down;Don/Doff right sock;Don/Doff left sock;Don/Doff right shoe;Don/Doff left shoe Lower body dressing/undressing: 5: Supervision: Safety issues/verbal cues  FIM - Toileting Toileting steps completed by patient: Adjust clothing prior to toileting;Performs perineal hygiene;Adjust clothing after toileting Toileting Assistive Devices: Grab bar or rail for support Toileting: 5: Supervision: Safety issues/verbal cues  FIM - Radio producer Devices: Grab bars;Elevated toilet seat Toilet Transfers: 5-To toilet/BSC: Supervision (verbal cues/safety issues);5-From toilet/BSC: Supervision (verbal cues/safety issues)  FIM - Engineer, site Assistive Devices: Arm rests;Walker Bed/Chair Transfer: 5: Chair or W/C > Bed: Supervision (verbal cues/safety issues);5: Bed > Chair or W/C:  Supervision (verbal cues/safety issues)  FIM - Locomotion: Wheelchair Distance: 150 Locomotion: Wheelchair: 5: Travels 150 ft or more: maneuvers on rugs and over door sills with supervision, cueing or coaxing FIM - Locomotion: Ambulation Locomotion: Chiropodist  Devices: Administrator Ambulation/Gait Assistance: 5: Supervision Locomotion: Ambulation: 5: Travels 150 ft or more with supervision/safety issues  Comprehension Comprehension Mode: Auditory Comprehension: 5-Follows basic conversation/direction: With extra time/assistive device  Expression Expression Mode: Verbal Expression: 5-Expresses basic needs/ideas: With extra time/assistive device  Social Interaction Social Interaction: 5-Interacts appropriately 90% of the time - Needs monitoring or encouragement for participation or interaction.  Problem Solving Problem Solving: 5-Solves basic problems: With no assist  Memory Memory: 5-Recognizes or recalls 90% of the time/requires cueing < 10% of the time  Medical Problem List and Plan:  1. DVT Prophylaxis/Anticoagulation: Discussed need of anticoagulation with Dr. Alverda Skeans v/s benefits of blood thinners. He recommends changing patient to eliquis (due to ETOH abuse hx) and to follow up in office for decision regarding need for long term anticoagulation.  2. Pain Management: Continue prn hydrocodone for now. Add tramadol additionally as needed.check knee xray  3. H/o panic attacks/depression/Mood: Continue zoloft. Decrease ativan to 0.5 mg prn. LCSW to follow for evaluation.  4. Neuropsych: This patient is capable of making decisions on her own behalf.  5. Fluid overload: improved . Check daily weights. Add low salt restrictions. Encourage medication compliance.Off oxygen -follow daily I's and O's as well as weights  6. A fib with RVR: currently in NSR on amiodarone and lopressor. To decrease amiodarone to 200 mg bid starting 07/01/13 then 200 mg daily past one week.  Start eliquis.  7. Non small-cell Lung Cancer: Discussion regarding treatment options past recovery per reports. To follow up with Dr. Humphrey Rolls past discharge.  8. Reactive Leucocytosis: afebrile, 12k 9. ABLA: Fe++ 10.  Dependant edema:TEDs 11.  Urinary frequency-ua neg 12. Hypokalemia: Due to diuretics. Daily supp.  13. Alcohol abuse: No withdrawal signs reported. No recent urine tox, pt reported to PA 4 "large scotch and waters" per day, but reported no"heavy"  ETOH to me since 2002 14.  Insomnia, daytime anxiety improve, reduced ativan and added ambien hs LOS (Days) 14 A FACE TO FACE EVALUATION WAS PERFORMED  Myrel Rappleye E 07/13/2013, 7:46 AM

## 2013-07-13 NOTE — Progress Notes (Signed)
Occupational Therapy Discharge Summary And Treatment Note  Patient Details  Name: Stephanie Oliver MRN: [id number] Date of Birth: 1951/05/24  Today's Date: 07/13/2013 Time: 1000-1100 Time Calculation (min): 60 min  Patient has met 6 of 6 long term goals due to improved activity tolerance, improved balance, ability to compensate for deficits and improved strength, pain management..  Patient to discharge at overall Supervision level.  Patient's care partner is independent to provide the necessary supervision assistance at discharge.  Patient reports that she has hired Psychologist, sport and exercise from 8-5 when her daughter is at work.  Encouraged daughter to keep the dogs away from patient when she is up on her feet.  Reasons goals not met: n/a secondary to all goals met.  Recommendation:  Patient will benefit from ongoing skilled OT services in home health setting to continue to advance functional skills in the area of BADL, iADL, Reduce care partner burden and care of her grandson.  Equipment: tub bench and BSC  Reasons for discharge: treatment goals met and discharge from hospital  Patient/family agrees with progress made and goals achieved: Yes  Skilled Intervention: Patient resting in w/c upon arrival.  Patient's daughter arrived ~10 minutes into the session to observe and receive caregiver education   OT Discharge Precautions/Restrictions  Precautions Precautions: Fall Restrictions Weight Bearing Restrictions: No RLE Weight Bearing: Weight bearing as tolerated Pain 6/10, premedicated, activity, rest and repositioned. ADL Overall Supervision and uses AE to improve independence Vision/Perception  Vision - History Baseline Vision: Wears glasses all the time Patient Visual Report:  (pt reports that visual hallucinations have stopped)  Cognition Arousal/Alertness: Awake/alert Orientation Level: Oriented X4 Memory: Impaired Memory Impairment: Decreased recall of new information;Decreased  short term memory Problem Solving: Impaired Problem Solving Impairment: Functional basic Sensation Sensation Light Touch: Appears Intact Stereognosis: Appears Intact Hot/Cold: Appears Intact Coordination Fine Motor Movements are Fluid and Coordinated: Yes Motor  Motor Motor: Within Functional Limits Motor - Skilled Clinical Observations: generalized weakness Mobility  Bed Mobility Bed Mobility:  (modified independent for all) Transfers Sit to Stand: 5: Supervision Sit to Stand Details: Verbal cues for precautions/safety Stand to Sit: 5: Supervision Stand to Sit Details (indicate cue type and reason): Verbal cues for precautions/safety  Trunk/Postural Assessment  Postural Control Protective Responses: delayed Postural Limitations: trunk flexion less apparent than at admission; decreased wt bearing LLE in sitting and standing  Balance Balance Balance Assessed: Yes Static Sitting Balance Static Sitting - Level of Assistance: 6: Modified independent (Device/Increase time) Dynamic Sitting Balance Dynamic Sitting - Level of Assistance: 6: Modified independent (Device/Increase time) Static Standing Balance Static Standing - Level of Assistance: 5: Stand by assistance Dynamic Standing Balance Dynamic Standing - Level of Assistance: 4: Min assist (reaching within BOS with L hand during functional activity) Extremity/Trunk Assessment RUE Assessment RUE Assessment: Within Functional Limits LUE Assessment LUE Assessment: Within Functional Limits  See FIM for current functional status  Sallye Lunz 07/13/2013, 12:41 PM

## 2013-07-13 NOTE — Discharge Summary (Signed)
Physician Discharge Summary  Patient ID: Stephanie Oliver MRN: [id number] DOB/AGE: 65-Sep-1952 66 y.o.  Admit date: 06/29/2013 Discharge date: 07/13/2013  Discharge Diagnoses:  Principal Problem:   Hip fracture requiring operative repair Active Problems:   Mediastinal lymphadenopathy   COPD (chronic obstructive pulmonary disease)   PAF (paroxysmal atrial fibrillation)   Acute blood loss anemia   Leucocytosis   Discharged Condition: Stable.  Significant Diagnostic Studies: N/A  Labs:  Basic Metabolic Panel:    Component Value Date/Time   NA 133* 06/30/2013 1125   K 3.8 06/30/2013 1125   CL 90* 06/30/2013 1125   CO2 32 06/30/2013 1125   GLUCOSE 144* 06/30/2013 1125   BUN 10 06/30/2013 1125   CREATININE 0.45* 06/30/2013 1125   CALCIUM 8.8 06/30/2013 1125   GFRNONAA >90 06/30/2013 1125   GFRAA >90 06/30/2013 1125      CBC:  Recent Labs Lab 07/07/13 0640 07/08/13 0555  WBC 11.9* 12.0*  HGB 10.4* 10.2*  HCT 31.1* 30.6*  MCV 99.4 99.7  PLT 408* 414*    CBG: No results found for this basename: GLUCAP,  in the last 168 hours  Brief HPI:   Stephanie Oliver is a 63 y.o. female with history of alcohol abuse, depression with anxiety, smoker who fell on 06/18/13 with onset of pain and difficulty wight bearing on RLE and inability to walk. She was admitted on 06/21/13 for work up. She was found to have right comminuted IT hip fracture as well as 7.2 x 5.0 x 6.4 cm necrotic right upper lobe mass (most consistent with primary lung malignancy) locally invasive to anterior mediastinum as well as enlarged right hilar node with smaller pericarnal nodes and additional sub-centimeter RUL pulmonary nodules. She underwent right hip IM nail fixation by Dr. Percell Miller as well as bronchoscopy with tracheobronchial needle aspiration of R-paratracheal mass.   Bone biopsy of right hip negative for cancer. Cytology positive for non-small cell carcinoma and patient to follow up with Hem/Onc past discharge.  Post op A  fib with RVR treated with amiodarone and lopressor with conversion to NSR. Dr. Terrence Dupont was consulted for input and amiodarone dose adjusted with return to NSR. He recommends anticoagulation for treatment of A fib.  Patient continued to have limitations in mobility and self care and CIR was recommended by rehab team.    Hospital Course: Stephanie Oliver was admitted to rehab 06/29/2013 for inpatient therapies to consist of PT, ST and OT at least three hours five days a week. Past admission physiatrist, therapy team and rehab RN have worked together to provide customized collaborative inpatient rehab. Dr. Terrence Dupont was contacted for input regarding anticoagulation and she was changed to eliquis. Confusion and hallucinations resolved past admission and mentation has improved. Ativan was decreased to 0.25 mg and has been effective in anxiety control. Hip incision has healed well without signs or symtpoms of infection. Heart rate has been stable and amiodarone was decreased to 200 mg daily prior to discharge.  Daily weights were monitored and she was placed on low salt diet. No signs of overload noted and respiratory status has been stable. Pain control has improved. She has made good progress but supervision is recommended due to safety concerns and impulsivity. Follow up Home health therapies to continue past discharge. She has also been set up with follow up with Dr. Earlie Server for 07/15/13.    Rehab course: During patient's stay in rehab weekly team conferences were held to monitor patient's progress, set goals and discuss barriers to discharge.  Patient has had improvement in activity tolerance, balance, postural control, as well as ability to compensate for deficits. She requires set up assist with supervision for bathing and dressing tasks. She is able to ambulate 150 feet with RW and supervision. Family education was done with daughter on propeer gaurding position as well as providing cues for safety.   Disposition:  01-Home or Self Care  Diet: Regular.  Special Instructions: 1. Needs supervision for ambulation.        Future Appointments Provider Department Dept Phone   07/15/2013 1:30 PM Curt Bears, MD [facility] [phone]       Medication List    STOP taking these medications       aspirin 325 MG EC tablet     aspirin 325 MG tablet     enoxaparin 100 MG/ML injection  Commonly known as:  LOVENOX     metoprolol succinate 100 MG 24 hr tablet  Commonly known as:  TOPROL-XL      TAKE these medications       acetaminophen 325 MG tablet  Commonly known as:  TYLENOL  Take 2 tablets (650 mg total) by mouth every 6 (six) hours as needed for mild pain (or Fever >/= 101).     ALPRAZolam 0.25 MG tablet--Rx 60 pills.   Commonly known as:    Take 1 tablet (0.25 mg total) by mouth 2 (two) times daily as needed for anxiety.     amiodarone 200 MG tablet  Commonly known as:  PACERONE  Take 1 tablet (200 mg total) by mouth daily.     apixaban 5 MG Tabs tablet  Commonly known as:  ELIQUIS  Take 1 tablet (5 mg total) by mouth 2 (two) times daily.     budesonide 0.25 MG/2ML nebulizer solution  Commonly known as:  PULMICORT  Take 2 mLs (0.25 mg total) by nebulization 2 (two) times daily.     dextromethorphan-guaiFENesin 30-600 MG per 12 hr tablet  Commonly known as:  MUCINEX DM  Take 1 tablet by mouth 2 (two) times daily.     diclofenac sodium 1 % Gel  Commonly known as:  VOLTAREN  Apply 2 g topically 4 (four) times daily.     docusate sodium 100 MG capsule  Commonly known as:  COLACE  Take 1 capsule (100 mg total) by mouth 2 (two) times daily. Continue this while taking narcotics to help with bowel movements     feeding supplement (ENSURE COMPLETE) Liqd  Take 237 mLs by mouth 2 (two) times daily between meals.     folic acid 1 MG tablet  Commonly known as:  FOLVITE  Take 1 tablet (1 mg total) by mouth daily.     furosemide 40 MG tablet   Commonly known as:  LASIX  Take 1 tablet (40 mg total) by mouth daily.     HYDROcodone-acetaminophen 5-325 MG per tablet Rx # 45 pills  Commonly known as:  NORCO  Take 1 tablet by mouth every 6 (six) hours as needed.     iron polysaccharides 150 MG capsule  Commonly known as:  NIFEREX  Take 1 capsule (150 mg total) by mouth daily.     levalbuterol 0.63 MG/3ML nebulizer solution  Commonly known as:  XOPENEX  Take 3 mLs (0.63 mg total) by nebulization 4 (four) times daily.     methocarbamol 500 MG tablet  Commonly known as:  ROBAXIN  Take 1 tablet (500 mg total) by mouth every 6 (  six) hours as needed for muscle spasms.     metoprolol tartrate 25 MG tablet  Commonly known as:  LOPRESSOR  Take 1 tablet (25 mg total) by mouth 2 (two) times daily.     multivitamin with minerals Tabs tablet  Take 1 tablet by mouth daily.     potassium chloride SA 20 MEQ tablet  Commonly known as:  K-DUR,KLOR-CON  Take 1 tablet (20 mEq total) by mouth 2 (two) times daily.     sertraline 100 MG tablet  Commonly known as:  ZOLOFT  Take 100 mg by mouth daily.     thiamine 100 MG tablet  Take 1 tablet (100 mg total) by mouth daily.     VITAMIN D-3 PO  Take 1 tablet by mouth daily.       Follow-up Information   Follow up with [name], MD. Call today. (for post hospital follow up in two weeks. )    Specialty:  Family Medicine   Contact information:   [address], [place] [address] [phone]       Follow up with Eilleen Kempf., MD On 07/15/2013. (appt @ 1;45 pm)    Specialty:  Oncology   Contact information:   [place] [address] [phone]       Follow up with Edmonia Lynch, D, MD. Call today. (for follow up appointment (hip))    Specialty:  Orthopedic Surgery   Contact information:   [address] [place] [address] [phone]       Follow up with Clent Demark, MD. Call today. (for follow up  on A fib. )    Specialty:  Cardiology   Contact information:   [address] [place] [address] [phone]       Call Charlett Blake, MD. (As needed)    Specialty:  Physical Medicine and Rehabilitation   Contact information:   [place] [address] [phone]       Signed: Bary Leriche 07/13/2013, 5:09 PM

## 2013-07-13 NOTE — Progress Notes (Addendum)
Physical Therapy Discharge Summary  Patient Details  Name: Stephanie Oliver MRN: [id number] Date of Birth: Feb 01, 1951  Today's Date: 07/13/2013 Time: 1110-1150 Time Calculation (min): 40 min  Patient has met 7 of 9 long term goals due to improved activity tolerance, improved balance, increased strength and decreased pain.  Patient to discharge at an ambulatory level Supervision.   Patient's care partner will be hired help during the day, daughter available nights to provide the necessary physical and cognitive assistance at discharge.  Reasons goals not met: Home setting navigation goals not met as pt unwilling to work in apartment. Her late husband was a patient here, and she has unresolved emotions about his passing. Attempted similar simulations in pt's room and gyms.   Recommendation:  Patient will benefit from ongoing skilled PT services in home health setting to continue to advance safe functional mobility, address ongoing impairments in RLE strength/ROM, activity tolerance, dynamic balance, and minimize fall risk.  Equipment: RW with 5" wheels  Reasons for discharge: treatment goals met and discharge from hospital  Patient/family agrees with progress made and goals achieved: Yes  Skilled PT tx:  Family education completed today with daughter present to observe and participate in all aspects of pt's mobility. Daughter able to return-demo all safe guarding techniques for transfers, gait with RW, bed mobility, stairs with 1 rail, and car. Instructed in RW management. They were sent home with sheet as reminder for proper ACE wrapping, icing for comfort, stair technique, and HEP.  Performed stairs 6x3 with 1 rail sideways with min-guard to ensure they felt comfortable. Pt still not wanting to enter apartment setting.  Pt/family with no remaining concerns, and encouraged to call if anything came up.   PT Discharge Precautions/Restrictions Precautions Precautions: Fall Restrictions Weight  Bearing Restrictions: No RLE Weight Bearing: Weight bearing as tolerated   Pain Pain Assessment Pain Score: 1  - modified tx, educated daughter on icing Vision/Perception  Vision - History Baseline Vision: Wears glasses all the time Patient Visual Report:  (pt reports that visual hallucinations have stopped) Vision - Assessment Eye Alignment: Within Functional Limits Vision Assessment: Vision not tested Perception Perception: Within Functional Limits Praxis Praxis: Intact  Cognition Overall Cognitive Status: Within Functional Limits for tasks assessed Arousal/Alertness: Awake/alert Orientation Level: Oriented X4 Memory: Impaired Memory Impairment: Decreased recall of new information;Decreased short term memory Problem Solving: Impaired Problem Solving Impairment: Functional basic Sensation Sensation Light Touch: Appears Intact Stereognosis: Appears Intact Hot/Cold: Appears Intact Coordination Gross Motor Movements are Fluid and Coordinated: Yes Fine Motor Movements are Fluid and Coordinated: Yes Motor  Motor Motor: Within Functional Limits Motor - Skilled Clinical Observations: generalized weakness  Mobility Bed Mobility Bed Mobility:  (modified independent for all) Supine to Sit: 6: Modified independent (Device/Increase time) Sit to Supine: 6: Modified independent (Device/Increase time) Transfers Sit to Stand: 5: Supervision Sit to Stand Details: Verbal cues for precautions/safety Stand to Sit: 5: Supervision Stand to Sit Details (indicate cue type and reason): Verbal cues for precautions/safety Stand Pivot Transfers: 5: Supervision Stand Pivot Transfer Details: Visual cues/gestures for precautions/safety Locomotion  Ambulation Ambulation: Yes Ambulation/Gait Assistance: 5: Supervision Ambulation Distance (Feet): 150 Feet Assistive device: Rolling walker Ambulation/Gait Assistance Details: Verbal cues for technique;Verbal cues for  precautions/safety Ambulation/Gait Assistance Details: Educated daughter on proper guarding position Stairs / Additional Locomotion Stairs: Yes Stairs Assistance: 4: Min guard Stair Management Technique: One rail Left;Step to pattern;Sideways Number of Stairs: 6 Height of Stairs: 7 Ramp: 5: Supervision Curb: 4: Photographer  Mobility: No - pt ambulatory at DC  Trunk/Postural Assessment  Cervical Assessment Cervical Assessment: Within Functional Limits Thoracic Assessment Thoracic Assessment: Within Functional Limits Lumbar Assessment Lumbar Assessment: Exceptions to WFL (decreased wt bearing R hip) Postural Control Postural Control: Deficits on evaluation Protective Responses: delayed Postural Limitations: trunk flexion less apparent than at admission; decreased wt bearing LLE in sitting and standing  Balance Balance Balance Assessed: Yes Static Sitting Balance Static Sitting - Level of Assistance: 6: Modified independent (Device/Increase time) Dynamic Sitting Balance Dynamic Sitting - Level of Assistance: 6: Modified independent (Device/Increase time) Static Standing Balance Static Standing - Level of Assistance: 5: Stand by assistance Dynamic Standing Balance Dynamic Standing - Level of Assistance: 4: Min assist (reaching within BOS with L hand during functional activity) Extremity Assessment  RUE Assessment RUE Assessment: Within Functional Limits LUE Assessment LUE Assessment: Within Functional Limits RLE Assessment RLE Assessment: Exceptions to [facility] (minimal edema lower leg and foot)   ) RLE Strength RLE Overall Strength Comments: grossly 3-/5 hip flexion, at least 3/5 knee ext and ankle DF LLE Assessment LLE Assessment: Exceptions to [facility] LLE Strength LLE Overall Strength Comments: grossly 4+/5 throughout, limited by R hip pain; R knee buckled upon sit> stand  See FIM for current functional status  Kennieth Rad, PT, DPT  07/13/2013,  12:56 PM

## 2013-07-13 NOTE — Progress Notes (Signed)
Patient discharged home via wheelchair, escorted by nursing staff and family.  Patient verbalized understanding of discharge instructions, no questions asked.  Prescriptions given to patient.  Patient aware of when to take next dose of medications.  Patients assistive equipment sent with patient as well as all patients belongings.  VSS.  Appears to be in no immediate distress at this time.

## 2013-07-14 ENCOUNTER — Telehealth: Payer: Self-pay | Admitting: *Deleted

## 2013-07-14 NOTE — Telephone Encounter (Signed)
Pt called needing to change appt time due to transportation issues.  Appt time changed

## 2013-07-15 ENCOUNTER — Telehealth: Payer: Self-pay | Admitting: *Deleted

## 2013-07-15 ENCOUNTER — Ambulatory Visit: Payer: No Typology Code available for payment source | Admitting: Physical Therapy

## 2013-07-15 ENCOUNTER — Ambulatory Visit: Payer: No Typology Code available for payment source | Admitting: Internal Medicine

## 2013-07-15 ENCOUNTER — Ambulatory Visit: Payer: No Typology Code available for payment source | Admitting: Radiation Oncology

## 2013-07-15 NOTE — Telephone Encounter (Signed)
Pt stated she is unable to make appt today and tomorrow.  I stated cancer center would call and reschedule.

## 2013-07-15 NOTE — Telephone Encounter (Signed)
Called and spoke with pt regarding appt.  Dr. Tammi Klippel would like to see pt and has been added on his schedule.  Pt is aware of seeing both Dr. Julien Nordmann and Dr. Tammi Klippel.

## 2013-07-16 ENCOUNTER — Other Ambulatory Visit: Payer: Self-pay | Admitting: Radiation Oncology

## 2013-07-16 ENCOUNTER — Ambulatory Visit: Admission: RE | Admit: 2013-07-16 | Payer: No Typology Code available for payment source | Source: Ambulatory Visit

## 2013-07-16 ENCOUNTER — Telehealth: Payer: Self-pay | Admitting: *Deleted

## 2013-07-16 NOTE — Telephone Encounter (Signed)
Called pt home phone.  Family stated she was not available.  I will call back

## 2013-07-19 ENCOUNTER — Encounter: Payer: Self-pay | Admitting: *Deleted

## 2013-07-19 NOTE — Progress Notes (Signed)
Ms. Astarita called updated on appt for mtoc 07/22/13

## 2013-07-20 ENCOUNTER — Telehealth: Payer: Self-pay

## 2013-07-20 NOTE — Telephone Encounter (Signed)
Diane an occupational therapist with [facility] called to let us know that patient declined evaulation.  According to patient she has caregivers at home.

## 2013-07-22 ENCOUNTER — Encounter: Payer: Self-pay | Admitting: *Deleted

## 2013-07-22 ENCOUNTER — Ambulatory Visit: Payer: No Typology Code available for payment source | Admitting: Internal Medicine

## 2013-07-22 ENCOUNTER — Ambulatory Visit: Payer: No Typology Code available for payment source | Admitting: Physical Therapy

## 2013-07-22 ENCOUNTER — Encounter: Payer: Self-pay | Admitting: Internal Medicine

## 2013-07-22 ENCOUNTER — Ambulatory Visit (HOSPITAL_BASED_OUTPATIENT_CLINIC_OR_DEPARTMENT_OTHER): Admitting: Internal Medicine

## 2013-07-22 VITALS — BP 120/84 | HR 92 | Temp 97.5°F | Resp 17 | Ht 63.0 in | Wt 123.4 lb

## 2013-07-22 DIAGNOSIS — C3490 Malignant neoplasm of unspecified part of unspecified bronchus or lung: Secondary | ICD-10-CM

## 2013-07-22 DIAGNOSIS — C3410 Malignant neoplasm of upper lobe, unspecified bronchus or lung: Secondary | ICD-10-CM

## 2013-07-22 DIAGNOSIS — M25559 Pain in unspecified hip: Secondary | ICD-10-CM

## 2013-07-22 NOTE — Progress Notes (Signed)
[facility] Work  Clinical Social Work met with patient, patient's brother Dominica Severin, patient's sister in law Denice Paradise, and medical oncologist to review treatment plan and offer support and assess for psychosocial needs.  The patient recently admitted to [facility] due to fall.  Ms. Schaber states she's still receiving physical therapy at home and has 24 hour nursing care (for the next week).  CSW and patient/family discussed options for support while patient completes treatment.  Patient's family expressed concerns regarding patient's lack of support and difficulty family dynamics.  The patient identified her brother and sister in law as her main support and requested we communicate with them- they reside in [place] and are only here for a short time.  The patient resides with her daughter and grandson.  CSW will follow up with patient/family tomorrow at chemo education class regarding supportive care options.   Clinical Social Work interventions:  Will follow up with patient/family at chemo class.  Polo Riley, MSW, LCSW, OSW-C Clinical Social Worker [facility] [phone]

## 2013-07-22 NOTE — Progress Notes (Signed)
[place] [phone]   [phone] Multidisciplinary thoracic oncology clinic [facility])   NEW PATIENT EVALUATION  REFERRING PHYSICIAN: Dr. Marcy Panning  REASON FOR CONSULTATION:  63 years old white female recently diagnosed with lung cancer.  HPI Stephanie Oliver is a 63 y.o. female with past medical history significant for hypertension, dyslipidemia and long history of smoking but quit in January of 2015. The patient mentions that in early January of 2015 she fell at home when she was chasing her dog. She presented to the emergency Department at [facility]. She was found to have comminuted intertrochanteric fracture of the proximal right femur. CT of the head at that time showed no acute abnormalities. Chest x-ray during her evaluation on 06/21/2013 showed a right pretracheal mass measuring 5.3 x 7.0 CM. This was followed by CT scan of the chest with contrast on 06/21/2013 and it showed 7.2 x 5.0 x 6.4 cm necrotic right upper lobe mass, most consistent with primary lung malignancy. The mass appears to be locally invasive into the anterior mediastinum medially. Enlarged necrotic right hilar lymph node with smaller precarinal node as above, worrisome for nodal metastasis. Additional subcentimeter pulmonary nodules within the right upper lobe as above, worrisome for malignancy as well.  The subacute healing fracture of the left posterolateral 8th rib. On 06/22/2013 the patient underwent right hip gamma nail fixation by Dr. Percell Miller. During her hospitalization, she was seen by Dr. Oliver Pila and she underwent flexible bronchoscopy, bronchial lavage of the right middle lobe as well as endobronchial ultrasound and transbronchial needle aspiration of the right paratracheal mass on 06/22/2013. The final pathology (Accession: [id number]) showed malignant cells with features favoring squamous cell carcinoma.  She was transferred to the inpatient [place] [facility] for physical therapy but now she is home. The patient came today for evaluation and recommendation regarding treatment of her condition.  The patient continues to complain of pain on the right hip as well as shortness of breath with exertion and cough productive of clear mucus was no chest pain or hemoptysis. She lost over 20 pounds in the last few week. She denied having any significant fever or chills, no nausea or vomiting. She has no headache or blurry vision.  Family history significant for mother is still alive at age 77 and father who died from heart disease. Brother had lymphoma. The patient is single and has one daughter. She was accompanied today by her brother Dominica Severin and her sister-in-law, Denice Paradise. They live in [place] but they would be her caregiver. She has a history of smoking up to 3 packs per day for around 40 years and quit in general 2015. She has no history of drug abuse but she drinks 3 glasses of wine daily.  HPI  Past Medical History  Diagnosis Date  . Depression   . Panic attacks   . Hypertension   . Smoker   . Alcohol abuse     Past Surgical History  Procedure Laterality Date  . Tonsillectomy    . Back surgery    . Femur im nail Right 06/22/2013    Procedure: RIGHT HIP GAMMA NAIL FIXATION   ;  Surgeon: Renette Butters, MD;  Location: [place];  Service: Orthopedics;  Laterality: Right;  . Video bronchoscopy with endobronchial ultrasound N/A 06/22/2013    Procedure: VIDEO BRONCHOSCOPY WITH ENDOBRONCHIAL ULTRASOUND;  Surgeon: Doree Fudge, MD;  Location: [place];  Service: Pulmonary;  Laterality: N/A;    Family History  Problem Relation Age of Onset  . CAD Father   . Arthritis Mother     Social History History  Substance Use Topics  . Smoking status: Former Smoker -- 1.50 packs/day    Types: Cigarettes    Quit date: 06/18/2013  . Smokeless tobacco: Never Used  . Alcohol Use: 2.4 oz/week    4 Shots of liquor per week    Allergies  Allergen  Reactions  . Lisinopril Diarrhea and Nausea Only  . Paxil [Paroxetine] Diarrhea and Nausea Only  . Penicillins Hives    Current Outpatient Prescriptions  Medication Sig Dispense Refill  . ALPRAZolam (XANAX) 0.5 MG tablet Take 1 tablet (0.5 mg total) by mouth 2 (two) times daily as needed for anxiety.  30 tablet  0  . Cholecalciferol (VITAMIN D-3 PO) Take 1 tablet by mouth daily.      Marland Kitchen dextromethorphan-guaiFENesin (MUCINEX DM) 30-600 MG per 12 hr tablet Take 1 tablet by mouth 2 (two) times daily.      . diclofenac sodium (VOLTAREN) 1 % GEL Apply 2 g topically 4 (four) times daily.  3 Tube  1  . feeding supplement, ENSURE COMPLETE, (ENSURE COMPLETE) LIQD Take 237 mLs by mouth 2 (two) times daily between meals.  60 Bottle  6  . folic acid (FOLVITE) 1 MG tablet Take 1 tablet (1 mg total) by mouth daily.  30 tablet  1  . furosemide (LASIX) 40 MG tablet Take 1 tablet (40 mg total) by mouth daily.  30 tablet  1  . HYDROcodone-acetaminophen (NORCO) 5-325 MG per tablet Take 1 tablet by mouth every 6 (six) hours as needed.  45 tablet  0  . iron polysaccharides (NIFEREX) 150 MG capsule Take 1 capsule (150 mg total) by mouth daily.  30 capsule  1  . metoprolol tartrate (LOPRESSOR) 25 MG tablet Take 1 tablet (25 mg total) by mouth 2 (two) times daily.  60 tablet  1  . Multiple Vitamin (MULTIVITAMIN WITH MINERALS) TABS tablet Take 1 tablet by mouth daily.      . potassium chloride SA (K-DUR,KLOR-CON) 20 MEQ tablet Take 1 tablet (20 mEq total) by mouth 2 (two) times daily.  60 tablet  1  . sertraline (ZOLOFT) 100 MG tablet Take 100 mg by mouth daily.      Marland Kitchen thiamine 100 MG tablet Take 1 tablet (100 mg total) by mouth daily.  30 tablet  1  . apixaban (ELIQUIS) 5 MG TABS tablet Take 1 tablet (5 mg total) by mouth 2 (two) times daily.  60 tablet  1  . docusate sodium (COLACE) 100 MG capsule Take 1 capsule (100 mg total) by mouth 2 (two) times daily. Continue this while taking narcotics to help with bowel  movements  30 capsule  1  . levalbuterol (XOPENEX) 0.63 MG/3ML nebulizer solution Take 3 mLs (0.63 mg total) by nebulization 4 (four) times daily.  3 mL  12  . methocarbamol (ROBAXIN) 500 MG tablet Take 1 tablet (500 mg total) by mouth every 6 (six) hours as needed for muscle spasms.  30 tablet  0   No current facility-administered medications for this visit.    Review of Systems  Constitutional: positive for anorexia, fatigue and weight loss Eyes: negative Ears, nose, mouth, throat, and face: negative Respiratory: positive for cough, dyspnea on exertion and sputum Cardiovascular: negative Gastrointestinal: negative Genitourinary:negative Integument/breast: negative Hematologic/lymphatic: negative Musculoskeletal:positive for bone pain Neurological: negative Behavioral/Psych: negative Endocrine: negative Allergic/Immunologic: negative  Physical Exam  GEX:BMWUX, healthy, no distress,  well nourished, well developed and anxious SKIN: skin color, texture, turgor are normal, no rashes or significant lesions HEAD: Normocephalic, No masses, lesions, tenderness or abnormalities EYES: normal, PERRLA EARS: External ears normal, Canals clear OROPHARYNX:no exudate, no erythema and lips, buccal mucosa, and tongue normal  NECK: supple, no adenopathy, no JVD LYMPH:  no palpable lymphadenopathy, no hepatosplenomegaly BREAST:not examined LUNGS: clear to auscultation , and palpation HEART: regular rate & rhythm, no murmurs and no gallops ABDOMEN:abdomen soft, non-tender, normal bowel sounds and no masses or organomegaly BACK: Back symmetric, no curvature., No CVA tenderness EXTREMITIES:no joint deformities, effusion, or inflammation, no edema, no skin discoloration  NEURO: alert & oriented x 3 with fluent speech, no focal motor/sensory deficits  PERFORMANCE STATUS: ECOG 1  LABORATORY DATA: Lab Results  Component Value Date   WBC 12.0* 07/08/2013   HGB 10.2* 07/08/2013   HCT 30.6*  07/08/2013   MCV 99.7 07/08/2013   PLT 414* 07/08/2013      Chemistry      Component Value Date/Time   NA 133* 06/30/2013 1125   K 3.8 06/30/2013 1125   CL 90* 06/30/2013 1125   CO2 32 06/30/2013 1125   BUN 10 06/30/2013 1125   CREATININE 0.45* 06/30/2013 1125      Component Value Date/Time   CALCIUM 8.8 06/30/2013 1125   ALKPHOS 159* 06/30/2013 1125   AST 31 06/30/2013 1125   ALT 43* 06/30/2013 1125   BILITOT 1.1 06/30/2013 1125       RADIOGRAPHIC STUDIES: Dg Chest 2 View  06/27/2013   CLINICAL DATA:  Shortness of breath, labored breathing, lung mass  EXAM: CHEST  2 VIEW  COMPARISON:  06/26/2013, 06/21/2013  FINDINGS: Right upper lobe paratracheal mass again evident. Rotated exam to the left. Left PICC line tip extends into the proximal right atrium. Mild cardiac enlargement with central vascular congestion. Increased bibasilar densities, suspect atelectasis. Right hemidiaphragm is elevated. No pneumothorax. Atherosclerosis of the aorta.  IMPRESSION: Right upper lobe paratracheal mass, better demonstrated by CT comparison  Cardiomegaly with vascular congestion  Slight improvement in bibasilar atelectasis compared to yesterday.  Right hemidiaphragm elevation   Electronically Signed   By: Daryll Brod M.D.   On: 06/27/2013 12:19   Dg Knee 1-2 Views Right  07/13/2013   CLINICAL DATA:  Knee pain.  EXAM: RIGHT KNEE - 1-2 VIEW  COMPARISON:  None.  FINDINGS: Intra medullary rod and screw noted. No acute bony abnormality. Degenerative changes noted about the right knee. Vascular calcification noted.  IMPRESSION: No acute abnormality. Intra medullary rod and screw node in the right femur. This appears well seated.   Electronically Signed   By: [place]   On: 07/13/2013 09:18   Ct Head W Wo Contrast  06/23/2013   CLINICAL DATA:  Encephalopathy. Lung mass. Rule out metastatic disease.  EXAM: CT HEAD WITHOUT AND WITH CONTRAST  TECHNIQUE: Contiguous axial images were obtained from the base of the  skull through the vertex without and with intravenous contrast  CONTRAST:  161mL OMNIPAQUE IOHEXOL 300 MG/ML  SOLN  COMPARISON:  CT head 06/21/2013  FINDINGS: Mild atrophy. Negative for hydrocephalus. Patchy hypodensity throughout the cerebral white matter bilaterally does not enhance and is most consistent with chronic microvascular ischemia.  Negative for acute infarct.  Negative for hemorrhage.  Postcontrast imaging reveals no enhancing lesions. No evidence of metastatic disease.  Calvarium is negative  IMPRESSION: Atrophy and chronic microvascular ischemic change in the white matter.  Negative for metastatic disease.  No  acute abnormality.   Electronically Signed   By: Franchot Gallo M.D.   On: 06/23/2013 10:57   Dg Chest Port 1 View  06/26/2013   CLINICAL DATA:  Medial right upper lobe mass, labored breathing  EXAM: PORTABLE CHEST - 1 VIEW  COMPARISON:  06/21/2013  FINDINGS: The medial right upper lobe paratracheal mass is again evident. Right hemidiaphragm is elevated. Heart is enlarged. Vascular congestion evident centrally with increased basilar atelectasis versus airspace disease. Both hemidiaphragms are partially obscured. Small effusions not excluded. No pneumothorax.  IMPRESSION: Persistent medial right upper lobe paratracheal mass.  Cardiomegaly with vascular congestion and developing basilar atelectasis versus airspace disease.  Developing small pleural effusions   Electronically Signed   By: Daryll Brod M.D.   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    PLAN: I had a lengthy discussion with the patient and her family today about her current disease stage, prognosis and treatment options. I would complete the staging workup by  ordering a PET scan. If there is no evidence for metastatic disease, I recommended for the patient a course of concurrent chemoradiation with weekly carboplatin for AUC of 2 and paclitaxel 45 mg/M2. I discussed with the patient adverse effect of the chemotherapy including but not limited to alopecia, myelosuppression, nausea and vomiting, peripheral neuropathy, liver or renal dysfunction. She would be seen later today by Dr. Lisbeth Renshaw for evaluation and discussion of the radiotherapy option. I expect the patient to start the first cycle of her concurrent chemoradiation on 08/02/2013. I will call her pharmacy with prescription for Compazine 10 mg by mouth every 6 hours as needed for nausea. I will arrange for the patient to have a chemotherapy education class before starting the first cycle of her treatment. She would come back for follow up visit in 3 weeks for reevaluation and management any adverse effect of her treatment.  the patient was seen at the multidisciplinary thoracic oncology clinic today by medical oncology, radiation oncology, social worker, thoracic navigator as well as physical therapy  The patient voices understanding of current disease status and treatment options and is in agreement with the current care plan.  All questions were answered. The patient knows to call the clinic with any problems, questions or concerns. We can certainly see the patient much sooner if necessary.  Thank you so much for allowing me to participate in the care of Stephanie Oliver. I will continue to follow up the patient with you and assist in her care.  I spent 45 minutes counseling the patient face to face. The total time spent in the appointment was 60 minutes.  Disclaimer: This note was dictated with voice recognition software. Similar sounding words can inadvertently be transcribed and may not be corrected upon review.   Stephanie Oliver K. 07/22/2013, 2:11 PM

## 2013-07-23 ENCOUNTER — Ambulatory Visit
Admission: RE | Admit: 2013-07-23 | Discharge: 2013-07-23 | Disposition: A | Discharge status: Home / Self Care | Source: Ambulatory Visit | Attending: Radiation Oncology | Admitting: Radiation Oncology

## 2013-07-23 ENCOUNTER — Other Ambulatory Visit

## 2013-07-23 VITALS — BP 109/74 | HR 106 | Temp 98.0°F | Resp 18 | Ht 63.0 in | Wt 123.7 lb

## 2013-07-23 DIAGNOSIS — C3490 Malignant neoplasm of unspecified part of unspecified bronchus or lung: Secondary | ICD-10-CM | POA: Diagnosis not present

## 2013-07-23 DIAGNOSIS — L589 Radiodermatitis, unspecified: Secondary | ICD-10-CM | POA: Diagnosis not present

## 2013-07-23 DIAGNOSIS — C3410 Malignant neoplasm of upper lobe, unspecified bronchus or lung: Secondary | ICD-10-CM | POA: Diagnosis present

## 2013-07-23 DIAGNOSIS — M79609 Pain in unspecified limb: Secondary | ICD-10-CM | POA: Insufficient documentation

## 2013-07-23 DIAGNOSIS — Z79899 Other long term (current) drug therapy: Secondary | ICD-10-CM | POA: Insufficient documentation

## 2013-07-23 DIAGNOSIS — C3491 Malignant neoplasm of unspecified part of right bronchus or lung: Secondary | ICD-10-CM

## 2013-07-23 DIAGNOSIS — Y842 Radiological procedure and radiotherapy as the cause of abnormal reaction of the patient, or of later complication, without mention of misadventure at the time of the procedure: Secondary | ICD-10-CM | POA: Insufficient documentation

## 2013-07-23 DIAGNOSIS — Z510 Encounter for antineoplastic radiation therapy: Secondary | ICD-10-CM | POA: Insufficient documentation

## 2013-07-23 DIAGNOSIS — C801 Malignant (primary) neoplasm, unspecified: Secondary | ICD-10-CM | POA: Insufficient documentation

## 2013-07-24 MED ORDER — PROCHLORPERAZINE MALEATE 10 MG PO TABS: 10.0000 mg | ORAL_TABLET | Freq: Four times a day (QID) | ORAL | Status: DC | PRN

## 2013-07-26 DIAGNOSIS — C3490 Malignant neoplasm of unspecified part of unspecified bronchus or lung: Secondary | ICD-10-CM | POA: Diagnosis not present

## 2013-07-26 DIAGNOSIS — W19XXXA Unspecified fall, initial encounter: Secondary | ICD-10-CM | POA: Diagnosis not present

## 2013-07-26 DIAGNOSIS — I4891 Unspecified atrial fibrillation: Secondary | ICD-10-CM | POA: Diagnosis not present

## 2013-07-26 DIAGNOSIS — S7290XD Unspecified fracture of unspecified femur, subsequent encounter for closed fracture with routine healing: Secondary | ICD-10-CM | POA: Diagnosis not present

## 2013-07-27 DIAGNOSIS — C3410 Malignant neoplasm of upper lobe, unspecified bronchus or lung: Secondary | ICD-10-CM | POA: Insufficient documentation

## 2013-07-27 NOTE — Progress Notes (Signed)
Radiation Oncology         [phone]) [phone] ________________________________  Name: Stephanie Oliver MRN: [id number]  Date: 07/22/2013  DOB: 08-Mar-1951  AQ:[id number] Weyman Croon, MD  [name], *   Curt Bears, M.D.  REFERRING PHYSICIAN: Marcy Panning, MD   DIAGNOSIS:  Metastatic non-small cell lung cancer  HISTORY OF PRESENT ILLNESS::Stephanie Oliver is a 63 y.o. female who is seen for an initial consultation visit. The patient is seen today in multidisciplinary thoracic clinic for her recent diagnosis of lung cancer. The patient indicates that she fell at in early 2015 in January when she was chasing her dog. She presented to the emergency department and was found to have a fracture of the proximal right femur. CT scan of the head did not reveal any acute abnormalities. A chest x-ray during her evaluation showed a right pretracheal mass measuring 7 cm in maximum dimension. This was followed by a CT scan of the chest on 06/21/2013. This showed a 7.2 x 5.0 x 6.4 cm necrotic right upper lobe mass which was felt to be most consistent with a primary lung malignancy. The mass appeared to invade locally the anterior mediastinum. In large necrotic right hilar lymphadenopathy was also present with a smaller precarinal node which was also worrisome for metastasis. Additional subcentimeter pulmonary nodules were seen within the right upper lobe which were also concerning for malignancy. A subacute healing fracture of the left eighth rib was present.  The patient underwent surgery on the right hip by Dr. Percell Miller on 06/22/2013. No malignant cells were seen from pathology from the right hip. During her hospitalization, the patient underwent flexible bronchoscopy with bronchial lavage of the right lung with endobronchial ultrasound and transbronchial needle aspiration of the right paratracheal mass. This was completed on 06/22/2013. Final pathology demonstrated malignant cells favoring squamous cell  carcinoma.  The patient subsequently was transferred to inpatient rehabilitation for physical therapy and now she is at home.    The patient indicates that she has lost approximately 20 pounds in the last few weeks. She denies any fever or, chills, nausea or vomiting. No headaches and no significant vision changes. The patient feels that she has been recovering well from her surgery with some ongoing pain in the right hip. She does have some shortness of breath on exertion with an occasional productive cough. No significant chest pain. No hemoptysis.   PREVIOUS RADIATION THERAPY: No   PAST MEDICAL HISTORY:  has a past medical history of Depression; Panic attacks; Hypertension; Smoker; and Alcohol abuse.     PAST SURGICAL HISTORY: Past Surgical History  Procedure Laterality Date  . Tonsillectomy    . Back surgery    . Femur im nail Right 06/22/2013    Procedure: RIGHT HIP GAMMA NAIL FIXATION   ;  Surgeon: Renette Butters, MD;  Location: [place];  Service: Orthopedics;  Laterality: Right;  . Video bronchoscopy with endobronchial ultrasound N/A 06/22/2013    Procedure: VIDEO BRONCHOSCOPY WITH ENDOBRONCHIAL ULTRASOUND;  Surgeon: Doree Fudge, MD;  Location: [place];  Service: Pulmonary;  Laterality: N/A;     FAMILY HISTORY: family history includes Arthritis in her mother; CAD in her father.   SOCIAL HISTORY:  reports that she quit smoking about 5 weeks ago. Her smoking use included Cigarettes. She smoked 1.50 packs per day. She has never used smokeless tobacco. She reports that she drinks about 2.4 ounces of alcohol per week. She reports that she does not use illicit drugs.   ALLERGIES: Lisinopril; Paxil;  and Penicillins   MEDICATIONS:  Current Outpatient Prescriptions  Medication Sig Dispense Refill  . ALPRAZolam (XANAX) 0.5 MG tablet Take 1 tablet (0.5 mg total) by mouth 2 (two) times daily as needed for anxiety.  30 tablet  0  . Cholecalciferol (VITAMIN D-3 PO) Take 1 tablet  by mouth daily.      Marland Kitchen dextromethorphan-guaiFENesin (MUCINEX DM) 30-600 MG per 12 hr tablet Take 1 tablet by mouth 2 (two) times daily.      . diclofenac sodium (VOLTAREN) 1 % GEL Apply 2 g topically 4 (four) times daily.  3 Tube  1  . feeding supplement, ENSURE COMPLETE, (ENSURE COMPLETE) LIQD Take 237 mLs by mouth 2 (two) times daily between meals.  60 Bottle  6  . folic acid (FOLVITE) 1 MG tablet Take 1 tablet (1 mg total) by mouth daily.  30 tablet  1  . furosemide (LASIX) 40 MG tablet Take 1 tablet (40 mg total) by mouth daily.  30 tablet  1  . HYDROcodone-acetaminophen (NORCO) 5-325 MG per tablet Take 1 tablet by mouth every 6 (six) hours as needed.  45 tablet  0  . iron polysaccharides (NIFEREX) 150 MG capsule Take 1 capsule (150 mg total) by mouth daily.  30 capsule  1  . metoprolol tartrate (LOPRESSOR) 25 MG tablet Take 1 tablet (25 mg total) by mouth 2 (two) times daily.  60 tablet  1  . Multiple Vitamin (MULTIVITAMIN WITH MINERALS) TABS tablet Take 1 tablet by mouth daily.      . potassium chloride SA (K-DUR,KLOR-CON) 20 MEQ tablet Take 1 tablet (20 mEq total) by mouth 2 (two) times daily.  60 tablet  1  . sertraline (ZOLOFT) 100 MG tablet Take 100 mg by mouth daily.      Marland Kitchen thiamine 100 MG tablet Take 1 tablet (100 mg total) by mouth daily.  30 tablet  1  . amiodarone (PACERONE) 200 MG tablet       . apixaban (ELIQUIS) 5 MG TABS tablet Take 1 tablet (5 mg total) by mouth 2 (two) times daily.  60 tablet  1  . docusate sodium (COLACE) 100 MG capsule Take 1 capsule (100 mg total) by mouth 2 (two) times daily. Continue this while taking narcotics to help with bowel movements  30 capsule  1  . methocarbamol (ROBAXIN) 500 MG tablet Take 1 tablet (500 mg total) by mouth every 6 (six) hours as needed for muscle spasms.  30 tablet  0  . metoprolol succinate (TOPROL-XL) 100 MG 24 hr tablet 25 mg.       . prochlorperazine (COMPAZINE) 10 MG tablet Take 1 tablet (10 mg total) by mouth every 6 (six)  hours as needed for nausea or vomiting.  60 tablet  0   No current facility-administered medications for this visit.     REVIEW OF SYSTEMS:  A 15 point review of systems is documented in the electronic medical record. This was obtained by the nursing staff. However, I reviewed this with the patient to discuss relevant findings and make appropriate changes.  Pertinent items are noted in HPI.    PHYSICAL EXAM:  height is 5\' 3"  (1.6 m) and weight is 123 lb 6.4 oz (55.974 kg). Her oral temperature is 97.5 F (36.4 C). Her blood pressure is 120/84 and her pulse is 92. Her respiration is 17 and oxygen saturation is 97%.   ECOG = 1  0 - Asymptomatic (Fully active, able to carry on all predisease activities without  restriction)  1 - Symptomatic but completely ambulatory (Restricted in physically strenuous activity but ambulatory and able to carry out work of a light or sedentary nature. For example, light housework, office work)  2 - Symptomatic, <50% in bed during the day (Ambulatory and capable of all self care but unable to carry out any work activities. Up and about more than 50% of waking hours)  3 - Symptomatic, >50% in bed, but not bedbound (Capable of only limited self-care, confined to bed or chair 50% or more of waking hours)  4 - Bedbound (Completely disabled. Cannot carry on any self-care. Totally confined to bed or chair)  5 - Death   Eustace Pen MM, Creech RH, Tormey DC, et al. [phone]). "Toxicity and response criteria of the [facility]". Maribel Oncol. 5 (6): 649-55  General: Well-developed, in no acute distress HEENT: Normocephalic, atraumatic; oral cavity clear Neck: Supple without any lymphadenopathy Cardiovascular: Regular rate and rhythm Respiratory: Decreased breath sounds within the right upper lung, otherwise clear GI: Soft, nontender, normal bowel sounds Extremities: No edema present Neuro: No focal deficits     LABORATORY DATA:  Lab Results   Component Value Date   WBC 12.0* 07/08/2013   HGB 10.2* 07/08/2013   HCT 30.6* 07/08/2013   MCV 99.7 07/08/2013   PLT 414* 07/08/2013   Lab Results  Component Value Date   NA 133* 06/30/2013   K 3.8 06/30/2013   CL 90* 06/30/2013   CO2 32 06/30/2013   Lab Results  Component Value Date   ALT 43* 06/30/2013   AST 31 06/30/2013   ALKPHOS 159* 06/30/2013   BILITOT 1.1 06/30/2013      RADIOGRAPHY: Dg Knee 1-2 Views Right  07/13/2013   CLINICAL DATA:  Knee pain.  EXAM: RIGHT KNEE - 1-2 VIEW  COMPARISON:  None.  FINDINGS: Intra medullary rod and screw noted. No acute bony abnormality. Degenerative changes noted about the right knee. Vascular calcification noted.  IMPRESSION: No acute abnormality. Intra medullary rod and screw node in the right femur. This appears well seated.   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 This appears to represent at a minimum a locally advanced disease, stage IIIa, T3 N2N0. The patient I believe is at risk of having metastatic disease and she will proceed with a PET scan for further staging.  The patient has bulky disease in the right lung and centrally within the mediastinum and I have recommended for the patient to begin radiation treatment as soon as possible. She has been scheduled for a simulation tomorrow and I expect to begin her treatment next week. I discussed with the patient a course of radiation treatment to be given concurrently with chemotherapy. I will initially planned for the patient to receive 3 weeks of treatment which would represent a palliative treatment given the local/regional extent of disease, pending PET scan. If the patient after further staging is felt to have potentially curable disease, then her treatment can be extended to a definitive course.  We therefore discussed the rationale of treatment in  addition to the possible side effects and risks of treatment as well. All of her questions were answered. She does wish to proceed with this treatment as seen as hospital.   PLAN: The patient will proceed with simulation tomorrow such that we can begin  treatment planning. The length of her treatment will be determined by further workup with a PET scan pending. She will begin chemotherapy in the near future through Dr. Julien Nordmann and she also has seen today.    I spent 60 minutes face to face with the patient and more than 50% of that time was spent in counseling and/or coordination of care.    ________________________________   Jodelle Gross, MD, PhD

## 2013-07-27 NOTE — Addendum Note (Signed)
Addended by: Kyung Rudd on: 07/27/2013 11:28 AM   Modules accepted: Level of Service

## 2013-07-28 ENCOUNTER — Ambulatory Visit
Admission: RE | Admit: 2013-07-28 | Discharge: 2013-07-28 | Disposition: A | Discharge status: Home / Self Care | Source: Ambulatory Visit | Attending: Radiation Oncology | Admitting: Radiation Oncology

## 2013-07-28 DIAGNOSIS — Z510 Encounter for antineoplastic radiation therapy: Secondary | ICD-10-CM | POA: Diagnosis not present

## 2013-07-28 DIAGNOSIS — C3491 Malignant neoplasm of unspecified part of right bronchus or lung: Secondary | ICD-10-CM

## 2013-07-28 NOTE — Progress Notes (Signed)
  Radiation Oncology         [phone] ________________________________  Name: Stephanie Oliver  MRN: [id number]  Date: 07/28/2013  DOB: 1950-12-18  Simulation Verification Note  Status: outpatient  NARRATIVE: The patient was brought to the treatment unit and placed in the planned treatment position. The clinical setup was verified. Then port films were obtained and uploaded to the radiation oncology medical record software.  The treatment beams were carefully compared against the planned radiation fields. The position location and shape of the radiation fields was reviewed. They targeted volume of tissue appears to be appropriately covered by the radiation beams. Organs at risk appear to be excluded as planned.  Based on my personal review, I approved the simulation verification. The patient's treatment will proceed as planned.  ------------------------------------------------  Sheral Apley Tammi Klippel, M.D.

## 2013-07-29 ENCOUNTER — Ambulatory Visit
Admission: RE | Admit: 2013-07-29 | Discharge: 2013-07-29 | Disposition: A | Discharge status: Home / Self Care | Source: Ambulatory Visit | Attending: Radiation Oncology | Admitting: Radiation Oncology

## 2013-07-29 DIAGNOSIS — Z510 Encounter for antineoplastic radiation therapy: Secondary | ICD-10-CM | POA: Diagnosis not present

## 2013-07-30 ENCOUNTER — Encounter: Payer: Self-pay | Admitting: Radiation Oncology

## 2013-07-30 ENCOUNTER — Ambulatory Visit
Admission: RE | Admit: 2013-07-30 | Discharge: 2013-07-30 | Disposition: A | Discharge status: Home / Self Care | Source: Ambulatory Visit | Attending: Radiation Oncology | Admitting: Radiation Oncology

## 2013-07-30 VITALS — BP 110/72 | HR 100 | Temp 97.7°F | Resp 20 | Wt 127.0 lb

## 2013-07-30 DIAGNOSIS — Z510 Encounter for antineoplastic radiation therapy: Secondary | ICD-10-CM | POA: Diagnosis not present

## 2013-07-30 DIAGNOSIS — C3410 Malignant neoplasm of upper lobe, unspecified bronchus or lung: Secondary | ICD-10-CM

## 2013-07-30 NOTE — Progress Notes (Signed)
Pt reports pain in her upper right leg x 2 days. She states it is with walking, weight bearing only. She attributes this pain to the colder weather. Pt has productive cough at times w/clear sputum, SOB w/ADL, fatigue.  Post sim ed completed with patient. Gave pt "Radiation and You" booklet w/all pertinent information marked and discussed, re: fatigue, skin irritation/care, throat irritation /care, nutrition, pain.

## 2013-07-30 NOTE — Progress Notes (Signed)
Department of Radiation Oncology  Phone:  [phone] Fax:        [phone]  Weekly Treatment Note    Name: Stephanie Oliver Date: 07/30/2013 MRN: [id number] DOB: 1950-09-02   Current dose: 7.5 Gy  Current fraction: 3   MEDICATIONS: Current Outpatient Prescriptions  Medication Sig Dispense Refill  . ALPRAZolam (XANAX) 0.5 MG tablet Take 1 tablet (0.5 mg total) by mouth 2 (two) times daily as needed for anxiety.  30 tablet  0  . amiodarone (PACERONE) 200 MG tablet       . apixaban (ELIQUIS) 5 MG TABS tablet Take 1 tablet (5 mg total) by mouth 2 (two) times daily.  60 tablet  1  . Cholecalciferol (VITAMIN D-3 PO) Take 1 tablet by mouth daily.      Marland Kitchen dextromethorphan-guaiFENesin (MUCINEX DM) 30-600 MG per 12 hr tablet Take 1 tablet by mouth 2 (two) times daily.      . diclofenac sodium (VOLTAREN) 1 % GEL Apply 2 g topically 4 (four) times daily.  3 Tube  1  . docusate sodium (COLACE) 100 MG capsule Take 1 capsule (100 mg total) by mouth 2 (two) times daily. Continue this while taking narcotics to help with bowel movements  30 capsule  1  . feeding supplement, ENSURE COMPLETE, (ENSURE COMPLETE) LIQD Take 237 mLs by mouth 2 (two) times daily between meals.  60 Bottle  6  . folic acid (FOLVITE) 1 MG tablet Take 1 tablet (1 mg total) by mouth daily.  30 tablet  1  . furosemide (LASIX) 40 MG tablet Take 1 tablet (40 mg total) by mouth daily.  30 tablet  1  . HYDROcodone-acetaminophen (NORCO) 5-325 MG per tablet Take 1 tablet by mouth every 6 (six) hours as needed.  45 tablet  0  . iron polysaccharides (NIFEREX) 150 MG capsule Take 1 capsule (150 mg total) by mouth daily.  30 capsule  1  . methocarbamol (ROBAXIN) 500 MG tablet Take 1 tablet (500 mg total) by mouth every 6 (six) hours as needed for muscle spasms.  30 tablet  0  . metoprolol succinate (TOPROL-XL) 100 MG 24 hr tablet 25 mg.       . metoprolol tartrate (LOPRESSOR) 25 MG tablet Take 1 tablet (25 mg total) by mouth 2 (two) times  daily.  60 tablet  1  . Multiple Vitamin (MULTIVITAMIN WITH MINERALS) TABS tablet Take 1 tablet by mouth daily.      . potassium chloride SA (K-DUR,KLOR-CON) 20 MEQ tablet Take 1 tablet (20 mEq total) by mouth 2 (two) times daily.  60 tablet  1  . prochlorperazine (COMPAZINE) 10 MG tablet Take 1 tablet (10 mg total) by mouth every 6 (six) hours as needed for nausea or vomiting.  60 tablet  0  . sertraline (ZOLOFT) 100 MG tablet Take 100 mg by mouth daily.      Marland Kitchen thiamine 100 MG tablet Take 1 tablet (100 mg total) by mouth daily.  30 tablet  1  . VENTOLIN HFA 108 (90 BASE) MCG/ACT inhaler        No current facility-administered medications for this encounter.     ALLERGIES: Lisinopril; Paxil; and Penicillins   LABORATORY DATA:  Lab Results  Component Value Date   WBC 12.0* 07/08/2013   HGB 10.2* 07/08/2013   HCT 30.6* 07/08/2013   MCV 99.7 07/08/2013   PLT 414* 07/08/2013   Lab Results  Component Value Date   NA 133* 06/30/2013   K 3.8 06/30/2013  CL 90* 06/30/2013   CO2 32 06/30/2013   Lab Results  Component Value Date   ALT 43* 06/30/2013   AST 31 06/30/2013   ALKPHOS 159* 06/30/2013   BILITOT 1.1 06/30/2013     NARRATIVE: Stephanie Oliver was seen today for weekly treatment management. The chart was checked and the patient's films were reviewed. The patient states she has done fine with treatment so far. No difficulties. She does continue to complain of some pain in the right leg.  PHYSICAL EXAMINATION: weight is 127 lb (57.607 kg). Her oral temperature is 97.7 F (36.5 C). Her blood pressure is 110/72 and her pulse is 100. Her respiration is 20 and oxygen saturation is 94%.        ASSESSMENT: The patient is doing satisfactorily with treatment.  PLAN: We will continue with the patient's radiation treatment as planned. We will discuss her case earlier after her upcoming PET scan next week.

## 2013-07-30 NOTE — Progress Notes (Addendum)
  Radiation Oncology         [phone] ________________________________  Name: Stephanie Oliver MRN: [id number]  Date: 07/23/2013  DOB: 1951/01/04  SIMULATION AND TREATMENT PLANNING NOTE  DIAGNOSIS:  Lung cancer  Site:  Right lung/mediastinum  NARRATIVE:  The patient was brought to the [place].  Identity was confirmed.  All relevant records and images related to the planned course of therapy were reviewed.   Written consent to proceed with treatment was confirmed which was freely given after reviewing the details related to the planned course of therapy had been reviewed with the patient.  Then, the patient was set-up in a stable reproducible  supine position for radiation therapy.  CT images were obtained.  Surface markings were placed.    Medically necessary complex treatment device(s) for immobilization:  Wing board.   The CT images were loaded into the planning software.  Then the target and avoidance structures were contoured.  Treatment planning then occurred.  The radiation prescription was entered and confirmed.  A total of 3 complex treatment devices were fabricated which relate to the designed radiation treatment fields. Each of these customized fields/ complex treatment devices will be used on a daily basis during the radiation course. I have requested : 3D Simulation  I have requested a DVH of the following structures: Lungs, gross tumor volume, spinal cord, esophagus.   PLAN:  The patient initially will be planned to receive 37.5 Gy in 15 fractions. The patient has additional that imaging planned, and her treatment may the altered accordingly depending on additional findings.    Special treatment procedure The patient will also receive concurrent chemotherapy during the treatment. The patient may therefore experience increased toxicity or side effects and the patient will be monitored for such problems. This may require extra lab work as necessary. This therefore  constitutes a special treatment procedure.    ________________________________   Jodelle Gross, MD, PhD

## 2013-08-02 ENCOUNTER — Ambulatory Visit (HOSPITAL_BASED_OUTPATIENT_CLINIC_OR_DEPARTMENT_OTHER)

## 2013-08-02 ENCOUNTER — Ambulatory Visit: Admitting: Nutrition

## 2013-08-02 ENCOUNTER — Other Ambulatory Visit (HOSPITAL_BASED_OUTPATIENT_CLINIC_OR_DEPARTMENT_OTHER)

## 2013-08-02 ENCOUNTER — Other Ambulatory Visit: Payer: Self-pay | Admitting: Internal Medicine

## 2013-08-02 ENCOUNTER — Ambulatory Visit
Admission: RE | Admit: 2013-08-02 | Discharge: 2013-08-02 | Disposition: A | Discharge status: Home / Self Care | Source: Ambulatory Visit | Attending: Radiation Oncology | Admitting: Radiation Oncology

## 2013-08-02 VITALS — BP 126/86 | HR 107 | Temp 97.3°F | Resp 16

## 2013-08-02 DIAGNOSIS — Z5111 Encounter for antineoplastic chemotherapy: Secondary | ICD-10-CM

## 2013-08-02 DIAGNOSIS — C3410 Malignant neoplasm of upper lobe, unspecified bronchus or lung: Secondary | ICD-10-CM

## 2013-08-02 DIAGNOSIS — C3491 Malignant neoplasm of unspecified part of right bronchus or lung: Secondary | ICD-10-CM

## 2013-08-02 DIAGNOSIS — Z510 Encounter for antineoplastic radiation therapy: Secondary | ICD-10-CM | POA: Diagnosis not present

## 2013-08-02 DIAGNOSIS — C3490 Malignant neoplasm of unspecified part of unspecified bronchus or lung: Secondary | ICD-10-CM

## 2013-08-02 LAB — CBC WITH DIFFERENTIAL/PLATELET
BASO%: 0.2 % (ref 0.0–2.0)
Basophils Absolute: 0 10*3/uL (ref 0.0–0.1)
EOS%: 0.4 % (ref 0.0–7.0)
Eosinophils Absolute: 0.1 10*3/uL (ref 0.0–0.5)
HCT: 36 % (ref 34.8–46.6)
HGB: 12 g/dL (ref 11.6–15.9)
LYMPH%: 7.7 % — ABNORMAL LOW (ref 14.0–49.7)
MCH: 31.2 pg (ref 25.1–34.0)
MCHC: 33.3 g/dL (ref 31.5–36.0)
MCV: 93.5 fL (ref 79.5–101.0)
MONO#: 2 10*3/uL — AB (ref 0.1–0.9)
MONO%: 14.7 % — AB (ref 0.0–14.0)
NEUT%: 77 % — ABNORMAL HIGH (ref 38.4–76.8)
NEUTROS ABS: 10.6 10*3/uL — AB (ref 1.5–6.5)
NRBC: 0 % (ref 0–0)
Platelets: 428 10*3/uL — ABNORMAL HIGH (ref 145–400)
RBC: 3.85 10*6/uL (ref 3.70–5.45)
RDW: 13.8 % (ref 11.2–14.5)
WBC: 13.8 10*3/uL — AB (ref 3.9–10.3)
lymph#: 1.1 10*3/uL (ref 0.9–3.3)

## 2013-08-02 LAB — COMPREHENSIVE METABOLIC PANEL (CC13)
ALBUMIN: 2.9 g/dL — AB (ref 3.5–5.0)
ALT: 19 U/L (ref 0–55)
ANION GAP: 12 meq/L — AB (ref 3–11)
AST: 19 U/L (ref 5–34)
Alkaline Phosphatase: 177 U/L — ABNORMAL HIGH (ref 40–150)
BILIRUBIN TOTAL: 0.55 mg/dL (ref 0.20–1.20)
BUN: 10 mg/dL (ref 7.0–26.0)
CO2: 28 mEq/L (ref 22–29)
Calcium: 10.2 mg/dL (ref 8.4–10.4)
Chloride: 94 mEq/L — ABNORMAL LOW (ref 98–109)
Creatinine: 0.6 mg/dL (ref 0.6–1.1)
Glucose: 99 mg/dl (ref 70–140)
Potassium: 4.3 mEq/L (ref 3.5–5.1)
SODIUM: 134 meq/L — AB (ref 136–145)
TOTAL PROTEIN: 7.3 g/dL (ref 6.4–8.3)

## 2013-08-02 MED ORDER — ONDANSETRON 16 MG/50ML IVPB (CHCC)
INTRAVENOUS | Status: AC
  Filled 2013-08-02: qty 16

## 2013-08-02 MED ORDER — SODIUM CHLORIDE 0.9 % IV SOLN
217.4000 mg | Freq: Once | INTRAVENOUS | Status: AC
  Administered 2013-08-02: 220 mg via INTRAVENOUS
  Filled 2013-08-02: qty 22

## 2013-08-02 MED ORDER — SODIUM CHLORIDE 0.9 % IV SOLN
45.0000 mg/m2 | Freq: Once | INTRAVENOUS | Status: AC
  Administered 2013-08-02: 72 mg via INTRAVENOUS
  Filled 2013-08-02: qty 12

## 2013-08-02 MED ORDER — DIPHENHYDRAMINE HCL 50 MG/ML IJ SOLN
INTRAMUSCULAR | Status: AC
  Filled 2013-08-02: qty 1

## 2013-08-02 MED ORDER — ONDANSETRON 16 MG/50ML IVPB (CHCC)
16.0000 mg | Freq: Once | INTRAVENOUS | Status: AC
  Administered 2013-08-02: 16 mg via INTRAVENOUS

## 2013-08-02 MED ORDER — DEXAMETHASONE SODIUM PHOSPHATE 20 MG/5ML IJ SOLN
INTRAMUSCULAR | Status: AC
  Filled 2013-08-02: qty 5

## 2013-08-02 MED ORDER — FAMOTIDINE IN NACL 20-0.9 MG/50ML-% IV SOLN
20.0000 mg | Freq: Once | INTRAVENOUS | Status: AC
  Administered 2013-08-02: 20 mg via INTRAVENOUS

## 2013-08-02 MED ORDER — FAMOTIDINE IN NACL 20-0.9 MG/50ML-% IV SOLN
INTRAVENOUS | Status: AC
  Filled 2013-08-02: qty 50

## 2013-08-02 MED ORDER — DEXAMETHASONE SODIUM PHOSPHATE 20 MG/5ML IJ SOLN
20.0000 mg | Freq: Once | INTRAMUSCULAR | Status: AC
  Administered 2013-08-02: 20 mg via INTRAVENOUS

## 2013-08-02 MED ORDER — DIPHENHYDRAMINE HCL 50 MG/ML IJ SOLN
50.0000 mg | Freq: Once | INTRAMUSCULAR | Status: AC
  Administered 2013-08-02: 50 mg via INTRAVENOUS

## 2013-08-02 MED ORDER — SODIUM CHLORIDE 0.9 % IV SOLN
Freq: Once | INTRAVENOUS | Status: AC
  Administered 2013-08-02: 10:00:00 via INTRAVENOUS

## 2013-08-02 NOTE — Progress Notes (Signed)
Patient is a 63 year old female diagnosed with non-small cell lung cancer receiving concurrent chemoradiation therapy.  She is a patient of Dr. Earlie Server.  Past medical history includes hypertension, dyslipidemia, tobacco, right femur fracture, alcohol usage, and bipolar disease.  Medications include Xanax, vitamin D3, Colace, Lasix, multivitamin, K-Dur, Compazine, Zoloft, and thiamine.  Labs include sodium 133, glucose 144, creatinine 0.4, 5, and albumin 2.3 on January 14.  Height: 63 inches. Weight: 127 pounds. Usual body weight 126 pounds documented 06/29/2013.  Patient unable to provide Usual body weight. BMI: 22.5.  Patient has a homecare aide assisting her with preparing meals.  She reports a 20 pound weight loss but is unable to clearly state her usual body weight.  Current weight is within normal limits for height.  Patient denies nausea and vomiting, or problems chewing and swallowing.  She has no problems with her bowels. She likes ensure and was drinking one every other day.  Nutrition diagnosis: Food and nutrition related knowledge deficit related to new diagnosis of lung cancer and associated treatments as evidenced by no prior need for nutrition related information.  Intervention: Patient was educated to consume small, frequent meals and snacks throughout the day using high-protein foods.  She was encouraged to consume adequate calories to promote weight maintenance.  Patient was educated on strategies for eating if she became nauseous.  Fact sheets provided along with coupons for supplements.  Questions were answered.  Teach back method used.  Monitoring, evaluation, goals: Patient will tolerate adequate calories and protein to promote weight maintenance and healing.  Next visit: Monday, March 2, during chemotherapy.

## 2013-08-02 NOTE — Progress Notes (Signed)
Prior to beginning therapy, Dr. Julien Nordmann notified that patient has 4 skin lesions on top of R hand and forearm that are scabbed over. Redness noted but patient denies pain. Pt WBC elevated at 13.8 but has been elevated in the past. Per Dr. Julien Nordmann, [place] to treat with chemotherapy as planned. Patient has been using neosporin at home; OK to use this and hydrocortisone topical for redness per MD. Patient verbalizes understanding.

## 2013-08-02 NOTE — Patient Instructions (Signed)
[facility] Discharge Instructions for Patients Receiving Chemotherapy  Today you received the following chemotherapy agents: Taxol and Carboplatin.  To help prevent nausea and vomiting after your treatment, we encourage you to take your nausea medication as prescribed.   If you develop nausea and vomiting that is not controlled by your nausea medication, call the clinic.   BELOW ARE SYMPTOMS THAT SHOULD BE REPORTED IMMEDIATELY:  *FEVER GREATER THAN 100.5 F  *CHILLS WITH OR WITHOUT FEVER  NAUSEA AND VOMITING THAT IS NOT CONTROLLED WITH YOUR NAUSEA MEDICATION  *UNUSUAL SHORTNESS OF BREATH  *UNUSUAL BRUISING OR BLEEDING  TENDERNESS IN MOUTH AND THROAT WITH OR WITHOUT PRESENCE OF ULCERS  *URINARY PROBLEMS  *BOWEL PROBLEMS  UNUSUAL RASH Items with * indicate a potential emergency and should be followed up as soon as possible.  Feel free to call the clinic you have any questions or concerns. The clinic phone number is [phone].    

## 2013-08-03 ENCOUNTER — Ambulatory Visit

## 2013-08-03 ENCOUNTER — Telehealth: Payer: Self-pay | Admitting: *Deleted

## 2013-08-03 NOTE — Telephone Encounter (Signed)
Called Stephanie Oliver for chemotherapy F/U.  Patient is doing well.  Denies n/v.  Denies any new side effects or symptoms.  Bowel and bladder is functioning well.  Eating and drinking well and I instructed to drink 64 oz minimum daily or at least the day before, of and after treatment.  Headache last night has gone away.  Asked if any injections tomorrow and what's involved with PET scan.  Tomorrow scheduled for radiation oncology and PET which I reviewed what to expect.  Did not fall asleep until 0400.  "I felt hyper last night.  And my face was red this morning."  Received dexamathasone 20 mg with chemotherapy.  Denies further questions at this time and encouraged to call if needed.  Reviewed how to call after hours in the case of an emergency.

## 2013-08-03 NOTE — Telephone Encounter (Signed)
Message copied by Cherylynn Ridges on Tue Aug 03, 2013  4:46 PM ------      Message from: Alla Feeling      Created: Mon Aug 02, 2013  3:13 PM      Regarding: chemo follow up call       First time taxol/carbo; no complications; patient has home health aide once daily. ------

## 2013-08-04 ENCOUNTER — Ambulatory Visit
Admission: RE | Admit: 2013-08-04 | Discharge: 2013-08-04 | Disposition: A | Discharge status: Home / Self Care | Source: Ambulatory Visit | Attending: Radiation Oncology | Admitting: Radiation Oncology

## 2013-08-04 ENCOUNTER — Encounter (HOSPITAL_COMMUNITY)
Admission: RE | Admit: 2013-08-04 | Discharge: 2013-08-04 | Disposition: A | Discharge status: Home / Self Care | Source: Ambulatory Visit | Attending: Internal Medicine | Admitting: Internal Medicine

## 2013-08-04 ENCOUNTER — Encounter (HOSPITAL_COMMUNITY): Payer: Self-pay

## 2013-08-04 DIAGNOSIS — R222 Localized swelling, mass and lump, trunk: Secondary | ICD-10-CM | POA: Insufficient documentation

## 2013-08-04 DIAGNOSIS — R599 Enlarged lymph nodes, unspecified: Secondary | ICD-10-CM | POA: Insufficient documentation

## 2013-08-04 DIAGNOSIS — J90 Pleural effusion, not elsewhere classified: Secondary | ICD-10-CM | POA: Insufficient documentation

## 2013-08-04 DIAGNOSIS — I700 Atherosclerosis of aorta: Secondary | ICD-10-CM | POA: Diagnosis not present

## 2013-08-04 DIAGNOSIS — C3490 Malignant neoplasm of unspecified part of unspecified bronchus or lung: Secondary | ICD-10-CM | POA: Insufficient documentation

## 2013-08-04 DIAGNOSIS — C7952 Secondary malignant neoplasm of bone marrow: Secondary | ICD-10-CM

## 2013-08-04 DIAGNOSIS — C787 Secondary malignant neoplasm of liver and intrahepatic bile duct: Secondary | ICD-10-CM | POA: Insufficient documentation

## 2013-08-04 DIAGNOSIS — I2510 Atherosclerotic heart disease of native coronary artery without angina pectoris: Secondary | ICD-10-CM | POA: Diagnosis not present

## 2013-08-04 DIAGNOSIS — C7951 Secondary malignant neoplasm of bone: Secondary | ICD-10-CM | POA: Insufficient documentation

## 2013-08-04 DIAGNOSIS — Z510 Encounter for antineoplastic radiation therapy: Secondary | ICD-10-CM | POA: Diagnosis not present

## 2013-08-04 LAB — GLUCOSE, CAPILLARY: Glucose-Capillary: 91 mg/dL (ref 70–99)

## 2013-08-04 MED ORDER — FLUDEOXYGLUCOSE F - 18 (FDG) INJECTION
7.5000 | Freq: Once | INTRAVENOUS | Status: AC | PRN
  Administered 2013-08-04: 7.5 via INTRAVENOUS

## 2013-08-05 ENCOUNTER — Ambulatory Visit

## 2013-08-06 ENCOUNTER — Ambulatory Visit
Admission: RE | Admit: 2013-08-06 | Discharge: 2013-08-06 | Disposition: A | Discharge status: Home / Self Care | Source: Ambulatory Visit | Attending: Radiation Oncology | Admitting: Radiation Oncology

## 2013-08-06 ENCOUNTER — Encounter: Payer: Self-pay | Admitting: Radiation Oncology

## 2013-08-06 VITALS — BP 110/83 | HR 106 | Temp 98.1°F | Resp 20 | Wt 127.3 lb

## 2013-08-06 DIAGNOSIS — Z510 Encounter for antineoplastic radiation therapy: Secondary | ICD-10-CM | POA: Diagnosis not present

## 2013-08-06 DIAGNOSIS — C3410 Malignant neoplasm of upper lobe, unspecified bronchus or lung: Secondary | ICD-10-CM

## 2013-08-06 NOTE — Progress Notes (Addendum)
Stephanie Oliver weekly rad txs, 6 completed, loose vongesticve cough, clear phelgm, 98% room air, appetite okay, ensure 2 cans a day, no diff iculty swallowing foods, soft foods mostly,  Fatigue , pt education done, discussed skin irritation,pain, fatigue, throat changes, eat more protein, stay hydrated, may need to go to soft foods, if difficulty swaloolowing  informe MD/staff, gave book radiation therapy and you  To pateint to read and review, teach back given, all questins answered 9:11 AM

## 2013-08-06 NOTE — Progress Notes (Signed)
Department of Radiation Oncology  Phone:  [phone] Fax:        [phone]  Weekly Treatment Note    Name: Stephanie Oliver Date: 08/06/2013 MRN: [id number] DOB: 1950-12-09   Current dose: 15 Gy  Current fraction: 6   MEDICATIONS: Current Outpatient Prescriptions  Medication Sig Dispense Refill  . ALPRAZolam (XANAX) 0.5 MG tablet Take 1 tablet (0.5 mg total) by mouth 2 (two) times daily as needed for anxiety.  30 tablet  0  . apixaban (ELIQUIS) 5 MG TABS tablet Take 1 tablet (5 mg total) by mouth 2 (two) times daily.  60 tablet  1  . Cholecalciferol (VITAMIN D-3 PO) Take 1 tablet by mouth daily.      Marland Kitchen dextromethorphan-guaiFENesin (MUCINEX DM) 30-600 MG per 12 hr tablet Take 1 tablet by mouth 2 (two) times daily.      . diclofenac sodium (VOLTAREN) 1 % GEL Apply 2 g topically 4 (four) times daily.  3 Tube  1  . feeding supplement, ENSURE COMPLETE, (ENSURE COMPLETE) LIQD Take 237 mLs by mouth 2 (two) times daily between meals.  60 Bottle  6  . folic acid (FOLVITE) 1 MG tablet Take 1 tablet (1 mg total) by mouth daily.  30 tablet  1  . furosemide (LASIX) 40 MG tablet Take 1 tablet (40 mg total) by mouth daily.  30 tablet  1  . HYDROcodone-acetaminophen (NORCO) 5-325 MG per tablet Take 1 tablet by mouth every 6 (six) hours as needed.  45 tablet  0  . iron polysaccharides (NIFEREX) 150 MG capsule Take 1 capsule (150 mg total) by mouth daily.  30 capsule  1  . methocarbamol (ROBAXIN) 500 MG tablet Take 1 tablet (500 mg total) by mouth every 6 (six) hours as needed for muscle spasms.  30 tablet  0  . metoprolol succinate (TOPROL-XL) 100 MG 24 hr tablet Take 25 mg by mouth daily.       . Multiple Vitamin (MULTIVITAMIN WITH MINERALS) TABS tablet Take 1 tablet by mouth daily.      . potassium chloride SA (K-DUR,KLOR-CON) 20 MEQ tablet Take 1 tablet (20 mEq total) by mouth 2 (two) times daily.  60 tablet  1  . prochlorperazine (COMPAZINE) 10 MG tablet Take 1 tablet (10 mg total) by mouth  every 6 (six) hours as needed for nausea or vomiting.  60 tablet  0  . sertraline (ZOLOFT) 100 MG tablet Take 100 mg by mouth daily.      Marland Kitchen thiamine 100 MG tablet Take 1 tablet (100 mg total) by mouth daily.  30 tablet  1  . VENTOLIN HFA 108 (90 BASE) MCG/ACT inhaler       . amiodarone (PACERONE) 200 MG tablet 200 mg daily.       Marland Kitchen docusate sodium (COLACE) 100 MG capsule Take 1 capsule (100 mg total) by mouth 2 (two) times daily. Continue this while taking narcotics to help with bowel movements  30 capsule  1  . metoprolol tartrate (LOPRESSOR) 25 MG tablet Take 1 tablet (25 mg total) by mouth 2 (two) times daily.  60 tablet  1   No current facility-administered medications for this encounter.     ALLERGIES: Lisinopril; Paxil; and Penicillins   LABORATORY DATA:  Lab Results  Component Value Date   WBC 13.8* 08/02/2013   HGB 12.0 08/02/2013   HCT 36.0 08/02/2013   MCV 93.5 08/02/2013   PLT 428* 08/02/2013   Lab Results  Component Value Date   NA  134* 08/02/2013   K 4.3 08/02/2013   CL 90* 06/30/2013   CO2 28 08/02/2013   Lab Results  Component Value Date   ALT 19 08/02/2013   AST 19 08/02/2013   ALKPHOS 177* 08/02/2013   BILITOT 0.55 08/02/2013     NARRATIVE: Stephanie Oliver was seen today for weekly treatment management. The chart was checked and the patient's films were reviewed. The patient states she is doing well with treatment. No worsening shortness of breath. She completed a PET scan 2 days ago. This demonstrated metastatic disease which is consistent with a very locally advanced at presentation. I discussed some of the details of the PET scan with her.  PHYSICAL EXAMINATION: weight is 127 lb 4.8 oz (57.743 kg). Her oral temperature is 98.1 F (36.7 C). Her blood pressure is 110/83 and her pulse is 106. Her respiration is 20 and oxygen saturation is 98%.        ASSESSMENT: The patient is doing satisfactorily with treatment.  PLAN: We will continue with the patient's radiation  treatment as planned.

## 2013-08-09 ENCOUNTER — Encounter: Payer: Self-pay | Admitting: *Deleted

## 2013-08-09 ENCOUNTER — Ambulatory Visit (HOSPITAL_BASED_OUTPATIENT_CLINIC_OR_DEPARTMENT_OTHER)

## 2013-08-09 ENCOUNTER — Telehealth: Payer: Self-pay | Admitting: Internal Medicine

## 2013-08-09 ENCOUNTER — Ambulatory Visit
Admission: RE | Admit: 2013-08-09 | Discharge: 2013-08-09 | Disposition: A | Discharge status: Home / Self Care | Source: Ambulatory Visit | Attending: Radiation Oncology | Admitting: Radiation Oncology

## 2013-08-09 ENCOUNTER — Telehealth: Payer: Self-pay | Admitting: *Deleted

## 2013-08-09 ENCOUNTER — Encounter: Payer: Self-pay | Admitting: Physician Assistant

## 2013-08-09 ENCOUNTER — Ambulatory Visit (HOSPITAL_BASED_OUTPATIENT_CLINIC_OR_DEPARTMENT_OTHER): Admitting: Physician Assistant

## 2013-08-09 ENCOUNTER — Other Ambulatory Visit (HOSPITAL_BASED_OUTPATIENT_CLINIC_OR_DEPARTMENT_OTHER)

## 2013-08-09 VITALS — BP 93/50 | HR 95 | Temp 97.8°F | Resp 18 | Ht 63.0 in | Wt 129.1 lb

## 2013-08-09 DIAGNOSIS — Z510 Encounter for antineoplastic radiation therapy: Secondary | ICD-10-CM | POA: Diagnosis not present

## 2013-08-09 DIAGNOSIS — C787 Secondary malignant neoplasm of liver and intrahepatic bile duct: Secondary | ICD-10-CM

## 2013-08-09 DIAGNOSIS — C3491 Malignant neoplasm of unspecified part of right bronchus or lung: Secondary | ICD-10-CM

## 2013-08-09 DIAGNOSIS — C7951 Secondary malignant neoplasm of bone: Secondary | ICD-10-CM

## 2013-08-09 DIAGNOSIS — C3490 Malignant neoplasm of unspecified part of unspecified bronchus or lung: Secondary | ICD-10-CM

## 2013-08-09 DIAGNOSIS — F411 Generalized anxiety disorder: Secondary | ICD-10-CM

## 2013-08-09 DIAGNOSIS — C3410 Malignant neoplasm of upper lobe, unspecified bronchus or lung: Secondary | ICD-10-CM

## 2013-08-09 DIAGNOSIS — C7952 Secondary malignant neoplasm of bone marrow: Secondary | ICD-10-CM

## 2013-08-09 DIAGNOSIS — Z5111 Encounter for antineoplastic chemotherapy: Secondary | ICD-10-CM

## 2013-08-09 LAB — COMPREHENSIVE METABOLIC PANEL (CC13)
ALK PHOS: 137 U/L (ref 40–150)
ALT: 16 U/L (ref 0–55)
AST: 17 U/L (ref 5–34)
Albumin: 2.7 g/dL — ABNORMAL LOW (ref 3.5–5.0)
Anion Gap: 12 mEq/L — ABNORMAL HIGH (ref 3–11)
BILIRUBIN TOTAL: 0.34 mg/dL (ref 0.20–1.20)
BUN: 10.9 mg/dL (ref 7.0–26.0)
CO2: 27 meq/L (ref 22–29)
CREATININE: 0.6 mg/dL (ref 0.6–1.1)
Calcium: 9.4 mg/dL (ref 8.4–10.4)
Chloride: 95 mEq/L — ABNORMAL LOW (ref 98–109)
GLUCOSE: 123 mg/dL (ref 70–140)
Potassium: 3.9 mEq/L (ref 3.5–5.1)
Sodium: 134 mEq/L — ABNORMAL LOW (ref 136–145)
Total Protein: 6.7 g/dL (ref 6.4–8.3)

## 2013-08-09 LAB — CBC WITH DIFFERENTIAL/PLATELET
BASO%: 0.2 % (ref 0.0–2.0)
BASOS ABS: 0 10*3/uL (ref 0.0–0.1)
EOS ABS: 0.1 10*3/uL (ref 0.0–0.5)
EOS%: 0.5 % (ref 0.0–7.0)
HEMATOCRIT: 32.7 % — AB (ref 34.8–46.6)
HEMOGLOBIN: 10.7 g/dL — AB (ref 11.6–15.9)
LYMPH%: 5.9 % — AB (ref 14.0–49.7)
MCH: 30.7 pg (ref 25.1–34.0)
MCHC: 32.7 g/dL (ref 31.5–36.0)
MCV: 94 fL (ref 79.5–101.0)
MONO#: 1 10*3/uL — ABNORMAL HIGH (ref 0.1–0.9)
MONO%: 9.2 % (ref 0.0–14.0)
NEUT%: 84.2 % — AB (ref 38.4–76.8)
NEUTROS ABS: 9.2 10*3/uL — AB (ref 1.5–6.5)
PLATELETS: 399 10*3/uL (ref 145–400)
RBC: 3.48 10*6/uL — ABNORMAL LOW (ref 3.70–5.45)
RDW: 14.1 % (ref 11.2–14.5)
WBC: 11 10*3/uL — ABNORMAL HIGH (ref 3.9–10.3)
lymph#: 0.7 10*3/uL — ABNORMAL LOW (ref 0.9–3.3)
nRBC: 0 % (ref 0–0)

## 2013-08-09 MED ORDER — DEXAMETHASONE SODIUM PHOSPHATE 20 MG/5ML IJ SOLN
20.0000 mg | Freq: Once | INTRAMUSCULAR | Status: AC
  Administered 2013-08-09: 20 mg via INTRAVENOUS

## 2013-08-09 MED ORDER — FAMOTIDINE IN NACL 20-0.9 MG/50ML-% IV SOLN
INTRAVENOUS | Status: AC
  Filled 2013-08-09: qty 50

## 2013-08-09 MED ORDER — SODIUM CHLORIDE 0.9 % IV SOLN
Freq: Once | INTRAVENOUS | Status: AC
  Administered 2013-08-09: 11:00:00 via INTRAVENOUS

## 2013-08-09 MED ORDER — DIPHENHYDRAMINE HCL 50 MG/ML IJ SOLN
INTRAMUSCULAR | Status: AC
  Filled 2013-08-09: qty 1

## 2013-08-09 MED ORDER — ONDANSETRON 16 MG/50ML IVPB (CHCC)
16.0000 mg | Freq: Once | INTRAVENOUS | Status: AC
  Administered 2013-08-09: 16 mg via INTRAVENOUS

## 2013-08-09 MED ORDER — DIPHENHYDRAMINE HCL 50 MG/ML IJ SOLN
50.0000 mg | Freq: Once | INTRAMUSCULAR | Status: AC
  Administered 2013-08-09: 50 mg via INTRAVENOUS

## 2013-08-09 MED ORDER — SODIUM CHLORIDE 0.9 % IV SOLN
45.0000 mg/m2 | Freq: Once | INTRAVENOUS | Status: AC
  Administered 2013-08-09: 72 mg via INTRAVENOUS
  Filled 2013-08-09: qty 12

## 2013-08-09 MED ORDER — DEXAMETHASONE SODIUM PHOSPHATE 20 MG/5ML IJ SOLN
INTRAMUSCULAR | Status: AC
  Filled 2013-08-09: qty 5

## 2013-08-09 MED ORDER — FAMOTIDINE IN NACL 20-0.9 MG/50ML-% IV SOLN
20.0000 mg | Freq: Once | INTRAVENOUS | Status: AC
  Administered 2013-08-09: 20 mg via INTRAVENOUS

## 2013-08-09 MED ORDER — SODIUM CHLORIDE 0.9 % IV SOLN
217.4000 mg | Freq: Once | INTRAVENOUS | Status: AC
  Administered 2013-08-09: 220 mg via INTRAVENOUS
  Filled 2013-08-09: qty 22

## 2013-08-09 MED ORDER — PROCHLORPERAZINE MALEATE 10 MG PO TABS: 10.0000 mg | ORAL_TABLET | Freq: Four times a day (QID) | ORAL | Status: DC | PRN

## 2013-08-09 MED ORDER — ONDANSETRON 16 MG/50ML IVPB (CHCC)
INTRAVENOUS | Status: AC
  Filled 2013-08-09: qty 16

## 2013-08-09 NOTE — Progress Notes (Signed)
Spoke with patient today at [facility].  She stated she quit smoking in Jan.  She denies any cravings to smoke.  I gave her my business card to call if she has any questions about smoking cessation.

## 2013-08-09 NOTE — Telephone Encounter (Signed)
Pt family called left vm message to call.  I called pt family back and left vm message to call.

## 2013-08-09 NOTE — Patient Instructions (Signed)
Continue with your course of concurrent chemoradiation as scheduled Followup in 2 weeks

## 2013-08-09 NOTE — Telephone Encounter (Signed)
Gave pt appt for lab,md and chemo for March emailed michelle regarding more chemo appt

## 2013-08-09 NOTE — Patient Instructions (Signed)
Stephan[facility] Oliver Discharge Instructions for Patients Receiving Chemotherapy  Today you received the following chemotherapy agents Taxol and Carboplatin  To help prevent nausea and vomiting after your treatment, we encourage you to take your nausea medication Compazine 10 mg every 6 hours as needed.  If you develop nausea and vomiting that is not controlled by your nausea medication, call the clinic.   BELOW ARE SYMPTOMS THAT SHOULD BE REPORTED IMMEDIATELY:  *FEVER GREATER THAN 100.5 F  *CHILLS WITH OR WITHOUT FEVER  NAUSEA AND VOMITING THAT IS NOT CONTROLLED WITH YOUR NAUSEA MEDICATION  *UNUSUAL SHORTNESS OF BREATH  *UNUSUAL BRUISING OR BLEEDING  TENDERNESS IN MOUTH AND THROAT WITH OR WITHOUT PRESENCE OF ULCERS  *URINARY PROBLEMS  *BOWEL PROBLEMS  UNUSUAL RASH Items with * indicate a potential emergency and should be followed up as soon as possible.  Feel free to call the clinic you have any questions or concerns. The clinic phone number is [phone].

## 2013-08-09 NOTE — Progress Notes (Addendum)
No images are attached to the encounter. No scans are attached to the encounter. No scans are attached to the encounter. 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   Summary: Stage IV (T3, N2, M1b)  PRIOR THERAPY: None  CURRENT THERAPY: Concurrent chemoradiation with chemotherapy the form of weekly carboplatin for AUC of 2 and paclitaxel at 45 mg per meter squared  DISEASE STAGE: Non-small cell cancer of right lung   Primary site: Lung (Right)   Staging method: AJCC 7th Edition   Clinical: Stage IV (T3, N2, 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   INTERVAL HISTORY: Demetress Tift 63 y.o. 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 She is currently undergoing a course of concurrent chemoradiation. She's completed 1 week of therapy. She reports that she has a contract with her primary care physician, Dr. Carol Ada. She usually takes Xanax 0.5 mg by mouth once or twice daily. She complains of some anxiety related to taking chemotherapy and request a Xanax tablet from [place] today prior to taking chemotherapy. She has some scratches on her right forearm from  her anxiety reaction. She has been alternating Neosporin and cortisone cream. These areas are healing well without any purulent drainage. Patient does report that she discontinued smoking and drinking on 06/18/2013. She does report some fatigue. She is currently scheduled for radiation therapy through 08/19/2013. She continues to have some pain in the right hip and is status post stabilization surgery after suffering a right hip fracture. She is able to ambulate relatively well with a walker at home however the skull care aid with her today states that she will also need an adolescent-sized wheelchair to help a very long ramp entering the home. She reports some fatigue as well as cough and shortness of breath with exertion but denied any other specific complaints.  MEDICAL HISTORY: Past Medical History  Diagnosis Date  . Depression   . Panic attacks   . Hypertension   . Smoker   . Alcohol abuse     ALLERGIES:  is allergic to lisinopril; paxil; and penicillins.  MEDICATIONS:  Current Outpatient Prescriptions  Medication Sig Dispense Refill  . ALPRAZolam (XANAX) 0.5 MG tablet Take 1 tablet (0.5 mg total) by mouth 2 (two) times daily as needed for anxiety.  30 tablet  0  . amiodarone (PACERONE) 200 MG tablet 200 mg daily.       Marland Kitchen apixaban (ELIQUIS) 5 MG TABS tablet Take 1 tablet (5 mg total) by mouth 2 (two) times daily.  60 tablet  1  . Cholecalciferol (VITAMIN D-3 PO) Take 1 tablet by mouth daily.      Marland Kitchen dextromethorphan-guaiFENesin (MUCINEX DM) 30-600 MG per 12 hr tablet Take 1 tablet  by mouth 2 (two) times daily.      Marland Kitchen docusate sodium (COLACE) 100 MG capsule Take 1 capsule (100 mg total) by mouth 2 (two) times daily. Continue this while taking narcotics to help with bowel movements  30 capsule  1  . feeding supplement, ENSURE COMPLETE, (ENSURE COMPLETE) LIQD Take 237 mLs by mouth 2 (two) times daily between meals.  60 Bottle  6  . folic acid (FOLVITE) 1 MG tablet Take 1 tablet (1 mg total)  by mouth daily.  30 tablet  1  . furosemide (LASIX) 40 MG tablet Take 1 tablet (40 mg total) by mouth daily.  30 tablet  1  . HYDROcodone-acetaminophen (NORCO) 5-325 MG per tablet Take 1 tablet by mouth every 6 (six) hours as needed.  45 tablet  0  . iron polysaccharides (NIFEREX) 150 MG capsule Take 1 capsule (150 mg total) by mouth daily.  30 capsule  1  . metoprolol succinate (TOPROL-XL) 100 MG 24 hr tablet Take 25 mg by mouth daily.       . metoprolol tartrate (LOPRESSOR) 25 MG tablet Take 1 tablet (25 mg total) by mouth 2 (two) times daily.  60 tablet  1  . potassium chloride SA (K-DUR,KLOR-CON) 20 MEQ tablet Take 1 tablet (20 mEq total) by mouth 2 (two) times daily.  60 tablet  1  . sertraline (ZOLOFT) 100 MG tablet Take 100 mg by mouth daily.      Marland Kitchen thiamine 100 MG tablet Take 1 tablet (100 mg total) by mouth daily.  30 tablet  1  . VENTOLIN HFA 108 (90 BASE) MCG/ACT inhaler       . budesonide (PULMICORT) 0.25 MG/2ML nebulizer solution       . diclofenac sodium (VOLTAREN) 1 % GEL Apply 2 g topically 4 (four) times daily.  3 Tube  1  . methocarbamol (ROBAXIN) 500 MG tablet Take 1 tablet (500 mg total) by mouth every 6 (six) hours as needed for muscle spasms.  30 tablet  0  . Multiple Vitamin (MULTIVITAMIN WITH MINERALS) TABS tablet Take 1 tablet by mouth daily.      . prochlorperazine (COMPAZINE) 10 MG tablet Take 1 tablet (10 mg total) by mouth every 6 (six) hours as needed for nausea or vomiting.  30 tablet  0   No current facility-administered medications for this visit.    SURGICAL HISTORY:  Past Surgical History  Procedure Laterality Date  . Tonsillectomy    . Back surgery    . Femur im nail Right 06/22/2013    Procedure: RIGHT HIP GAMMA NAIL FIXATION   ;  Surgeon: Renette Butters, MD;  Location: [facility];  Service: Orthopedics;  Laterality: Right;  . Video bronchoscopy with endobronchial ultrasound N/A 06/22/2013    Procedure: VIDEO BRONCHOSCOPY WITH ENDOBRONCHIAL ULTRASOUND;   Surgeon: Doree Fudge, MD;  Location: [facility];  Service: Pulmonary;  Laterality: N/A;    REVIEW OF SYSTEMS:  Constitutional: positive for fatigue Eyes: negative Ears, nose, mouth, throat, and face: negative Respiratory: positive for cough and dyspnea on exertion Cardiovascular: negative Gastrointestinal: negative Genitourinary:negative Integument/breast: negative Hematologic/lymphatic: negative Musculoskeletal:positive for bone pain Neurological: negative Behavioral/Psych: positive for anxiety Endocrine: negative Allergic/Immunologic: negative   PHYSICAL EXAMINATION: General appearance: alert, cooperative, appears stated age and no distress Head: Normocephalic, without obvious abnormality, atraumatic Neck: no adenopathy, no carotid bruit, no JVD, supple, symmetrical, trachea midline and thyroid not enlarged, symmetric, no tenderness/mass/nodules Lymph nodes: Cervical, supraclavicular, and axillary nodes normal. Resp: clear to auscultation bilaterally  Cardio: regular rate and rhythm, S1, S2 normal, no murmur, click, rub or gallop GI: soft, non-tender; bowel sounds normal; no masses,  no organomegaly Extremities: Patient continues to have pain related to her right hip stabilization surgery on 06/22/2013 as well as some right lower extremity edema Neurologic: Grossly normal  ECOG PERFORMANCE STATUS: 1 - Symptomatic but completely ambulatory  Blood pressure 93/50, pulse 95, temperature 97.8 F (36.6 C), temperature source Oral, resp. rate 18, height 5\' 3"  (1.6 m), weight 129 lb 1.6 oz (58.559 kg), SpO2 100.00%.  LABORATORY DATA: Lab Results  Component Value Date   WBC 11.0* 08/09/2013   HGB 10.7* 08/09/2013   HCT 32.7* 08/09/2013   MCV 94.0 08/09/2013   PLT 399 08/09/2013      Chemistry      Component Value Date/Time   NA 134* 08/09/2013 0907   NA 133* 06/30/2013 1125   K 3.9 08/09/2013 0907   K 3.8 06/30/2013 1125   CL 90* 06/30/2013 1125   CO2 27 08/09/2013 0907   CO2  32 06/30/2013 1125   BUN 10.9 08/09/2013 0907   BUN 10 06/30/2013 1125   CREATININE 0.6 08/09/2013 0907   CREATININE 0.45* 06/30/2013 1125      Component Value Date/Time   CALCIUM 9.4 08/09/2013 0907   CALCIUM 8.8 06/30/2013 1125   ALKPHOS 137 08/09/2013 0907   ALKPHOS 159* 06/30/2013 1125   AST 17 08/09/2013 0907   AST 31 06/30/2013 1125   ALT 16 08/09/2013 0907   ALT 43* 06/30/2013 1125   BILITOT 0.34 08/09/2013 0907   BILITOT 1.1 06/30/2013 1125       RADIOGRAPHIC STUDIES:  Dg Knee 1-2 Views Right  07/13/2013   CLINICAL DATA:  Knee pain.  EXAM: RIGHT KNEE - 1-2 VIEW  COMPARISON:  None.  FINDINGS: Intra medullary rod and screw noted. No acute bony abnormality. Degenerative changes noted about the right knee. Vascular calcification noted.  IMPRESSION: No acute abnormality. Intra medullary rod and screw node in the right femur. This appears well seated.   Electronically Signed   By: Marcello Moores  Register   On: 07/13/2013 09:18   Nm Pet Image Initial (pi) Skull Base To Thigh  08/04/2013   CLINICAL DATA:  Initial treatment strategy for non-small-cell lung cancer.  EXAM: NUCLEAR MEDICINE PET SKULL BASE TO THIGH  FASTING BLOOD GLUCOSE:  Value: 91 mg/dl  TECHNIQUE: 7.5 mCi F-18 FDG was injected intravenously. Full-ring PET imaging was performed from the skull base to thigh after the radiotracer. CT data was obtained and used for attenuation correction and anatomic localization.  COMPARISON:  Chest CT 06/21/2013.  FINDINGS: NECK  No hypermetabolic lymph nodes in the neck. However, there is a focus of hypermetabolism which localizes to right internal jugular vein (image 44 of series 4 and image 45 of series 605), which is of uncertain etiology and significance.  CHEST  Large mass in the medial aspect of the right upper lobe measures 7.4 x 5.8 cm and demonstrates peripheral hypermetabolism (SUVmax = 12.2)with central hypometabolism, likely to reflect central necrosis. This mass is highly aggressive in appearance with  probable mediastinal invasion along the medial margin where it extends to make contact with the right internal jugular vein and superior vena cava (superior vena cava is markedly narrowed and slit-like in appearance immediately above the superior cavoatrial junction). There is also right hilar lymphadenopathy with a large right hilar nodal mass that measures approximately 5.4 x 5.7 cm and also demonstrates peripheral hypermetabolism (SUVmax = 9.4).  There is also a hypermetabolic lower right paratracheal lymph node that measures at least 1.6 cm in short axis (SUVmax = 5.8). Extensive postobstructive changes are noted in the right upper and middle lobe, compatible with postobstructive pneumonitis. Similar findings are present to a lesser degree in the dependent portion of the right lower lobe, and there is a small right-sided pleural effusion layering dependently. No definite pleural based hypermetabolism is noted at this time. There is atherosclerosis of the thoracic aorta, the great vessels of the mediastinum and the coronary arteries, including calcified atherosclerotic plaque in the left main, left anterior descending, left circumflex and right coronary arteries.  ABDOMEN/PELVIS  There are 2 hypermetabolic liver lesions, largest of which is in the inferior aspect of segment 3 measuring 4.2 x 2.8 cm (SUVmax = 10.2), while the smaller lesion is in the central aspect of segment 8 of the liver measuring 1.9 x 1.9 cm (SUVmax = 12.2). Several tiny calcifications within the spleen are presumably calcified granulomas. The appearance of the gallbladder, pancreas, bilateral adrenal glands and right kidney is unremarkable. In the posterior aspect of the interpolar region of the left kidney there is a 7 mm high attenuation lesion which is not hypermetabolic, favored to represent a proteinaceous or hemorrhagic cyst. Atherosclerotic calcifications are noted throughout the abdominal and pelvic vasculature, without evidence of  aneurysm. No definite hypermetabolic lymphadenopathy identified within the abdomen or pelvis.  SKELETON  Ill-defined area of sclerosis in the right side of the sacrum is diffusely hypermetabolic (SUVmax = 62.9). In addition, there is a 6 mm sclerotic focus in the anterior aspect of the left ilium (image 150 of series 4) that is hypermetabolic (SUVmax = 3.4). Postoperative changes of ORIF in the right hip are noted. Old healed fracture of the posterolateral aspect of the left eighth rib.  IMPRESSION: 1. Findings are compatible with stage IV lung cancer with a 7.4 x 5.8 cm right upper lobe mass that demonstrates direct mediastinal invasion, right hilar and low right paratracheal lymphadenopathy, in addition to multiple liver and bone metastases, as detailed above. 2. Postobstructive changes in the right lung, predominately in the right upper and middle lobes, with a small right-sided pleural effusion which is simple in appearance at this time. 3. Atherosclerosis, including left main and 3 vessel coronary artery disease. 4. Additional incidental findings, as above.   Electronically Signed   By: Vinnie Langton M.D.   On: 08/04/2013 14:33     ASSESSMENT/PLAN: Patient is a pleasant 63 year old Caucasian female initially thought to have stage IIIA disease, however after review of the PET scan with multiple liver lesions as well as bone metastasis, the patient has stage IV disease. Patient was discussed with as well as seen by Dr. Julien Nordmann to review the results of the PET scan with her as well as her change in staging. Regarding the Xanax, we have recommended the patient continue with her contract with Dr. Tamala Julian and any Xanax be prescribed by her office. She'll proceed with her course of concurrent chemoradiation as scheduled. There was a mixup with her Compazine prescription and this will be sent via E. scribed to her pharmacy of record. She will continue with her course of concurrent chemoradiation as scheduled and  return in 2 weeks for another symptom management visit.     Wynetta Emery, Chenay Nesmith E, PA-C  All questions were answered. The patient knows to call the clinic with any problems, questions or concerns. We can certainly see the patient much sooner if necessary.  ADDENDUM:  Hematology/Oncology Attending: I had a face to face encounter with the patient today. I recommended her care plan. She is a very pleasant 63 years old white female who was initially diagnosed with stage IIIA by the recent PET scan showed metastatic disease to the liver and bone consistent with stage IV non-small cell lung cancer. The patient is currently undergoing a course of concurrent chemoradiation to the right lung and mediastinum secondary to large locally advanced disease. She tolerated the first week of her treatment fairly well with no significant adverse effects. She denied having any significant fever or chills. I recommended for the patient to proceed with her current treatment with concurrent chemoradiation but this would be palliative in nature and may be done for a short period as the patient would need systemic chemotherapy for her metastatic non-small cell lung cancer soon.  I discussed my recommendation and showed the images to the patient today. She is in agreement with the current plan. She would come back for follow up visit in 2 weeks for evaluation and management any adverse effect of her treatment. She was advised to call immediately if she has any concerning symptoms in the interval.  Disclaimer: This note was dictated with voice recognition software. Similar sounding words can inadvertently be transcribed and may not be corrected upon review. Eilleen Kempf., MD 08/09/2013

## 2013-08-10 ENCOUNTER — Telehealth: Payer: Self-pay | Admitting: *Deleted

## 2013-08-10 ENCOUNTER — Ambulatory Visit

## 2013-08-10 NOTE — Telephone Encounter (Signed)
Pt family called, sister in law on pt HIPPA.  I listen as she had many concerns.  1. Family has concerns about financial issues.  I gave sister in laws financial advocates name to follow up with for concerns. 2. Family is coming in town March 16 th and wondering if pt could have scan before to see if she is responding to treatment.  I spoke with Dr. Julien Nordmann and he stated to soon for scan.  He can speak with the family more about treatment options at the March 16 th visit.  I relayed this information to sister in law. She thought that would be a good idea. 3. Patient was to bring "papers" for Dr. Julien Nordmann.  Unfortunately, patient did not bring any forms or papers to be signed by Dr. Julien Nordmann.  I relayed that to sister in law. 4. Family has concerns about when to take FM LA.  I spoke with Dr. Julien Nordmann and he stated it can be addressed at March visit.  Sister in law again thought was a good idea.   5. Family has concerns about caregiver.  The family just wants [place] to be aware there may be issues with sitter wanting patient to have Xanax.  I spoke with Adrena PA and Dr. Julien Nordmann and they are aware.

## 2013-08-11 ENCOUNTER — Encounter: Payer: Self-pay | Admitting: *Deleted

## 2013-08-11 ENCOUNTER — Other Ambulatory Visit: Payer: Self-pay

## 2013-08-11 ENCOUNTER — Telehealth: Payer: Self-pay | Admitting: Internal Medicine

## 2013-08-11 ENCOUNTER — Inpatient Hospital Stay (HOSPITAL_COMMUNITY)
Admission: EM | Admit: 2013-08-11 | Discharge: 2013-08-15 | DRG: 308 | Disposition: A | Discharge status: Home / Self Care | Attending: Cardiology | Admitting: Cardiology

## 2013-08-11 ENCOUNTER — Encounter (HOSPITAL_COMMUNITY): Payer: Self-pay | Admitting: Emergency Medicine

## 2013-08-11 ENCOUNTER — Emergency Department (HOSPITAL_COMMUNITY)

## 2013-08-11 ENCOUNTER — Ambulatory Visit
Admission: RE | Admit: 2013-08-11 | Discharge: 2013-08-11 | Disposition: A | Discharge status: Home / Self Care | Source: Ambulatory Visit | Attending: Radiation Oncology | Admitting: Radiation Oncology

## 2013-08-11 ENCOUNTER — Telehealth: Payer: Self-pay | Admitting: *Deleted

## 2013-08-11 DIAGNOSIS — C779 Secondary and unspecified malignant neoplasm of lymph node, unspecified: Secondary | ICD-10-CM | POA: Diagnosis present

## 2013-08-11 DIAGNOSIS — Z888 Allergy status to other drugs, medicaments and biological substances status: Secondary | ICD-10-CM

## 2013-08-11 DIAGNOSIS — E8809 Other disorders of plasma-protein metabolism, not elsewhere classified: Secondary | ICD-10-CM | POA: Diagnosis present

## 2013-08-11 DIAGNOSIS — F329 Major depressive disorder, single episode, unspecified: Secondary | ICD-10-CM | POA: Diagnosis present

## 2013-08-11 DIAGNOSIS — J449 Chronic obstructive pulmonary disease, unspecified: Secondary | ICD-10-CM | POA: Diagnosis present

## 2013-08-11 DIAGNOSIS — Y842 Radiological procedure and radiotherapy as the cause of abnormal reaction of the patient, or of later complication, without mention of misadventure at the time of the procedure: Secondary | ICD-10-CM | POA: Diagnosis not present

## 2013-08-11 DIAGNOSIS — Z9221 Personal history of antineoplastic chemotherapy: Secondary | ICD-10-CM | POA: Diagnosis not present

## 2013-08-11 DIAGNOSIS — F17200 Nicotine dependence, unspecified, uncomplicated: Secondary | ICD-10-CM | POA: Diagnosis present

## 2013-08-11 DIAGNOSIS — J189 Pneumonia, unspecified organism: Secondary | ICD-10-CM | POA: Diagnosis present

## 2013-08-11 DIAGNOSIS — I10 Essential (primary) hypertension: Secondary | ICD-10-CM | POA: Diagnosis present

## 2013-08-11 DIAGNOSIS — D638 Anemia in other chronic diseases classified elsewhere: Secondary | ICD-10-CM | POA: Diagnosis present

## 2013-08-11 DIAGNOSIS — F1010 Alcohol abuse, uncomplicated: Secondary | ICD-10-CM | POA: Diagnosis present

## 2013-08-11 DIAGNOSIS — F410 Panic disorder [episodic paroxysmal anxiety] without agoraphobia: Secondary | ICD-10-CM | POA: Diagnosis present

## 2013-08-11 DIAGNOSIS — F3289 Other specified depressive episodes: Secondary | ICD-10-CM | POA: Diagnosis present

## 2013-08-11 DIAGNOSIS — Z923 Personal history of irradiation: Secondary | ICD-10-CM | POA: Diagnosis not present

## 2013-08-11 DIAGNOSIS — Z79899 Other long term (current) drug therapy: Secondary | ICD-10-CM | POA: Diagnosis not present

## 2013-08-11 DIAGNOSIS — Z880 Allergy status to penicillin: Secondary | ICD-10-CM

## 2013-08-11 DIAGNOSIS — Z510 Encounter for antineoplastic radiation therapy: Secondary | ICD-10-CM | POA: Diagnosis present

## 2013-08-11 DIAGNOSIS — Z8249 Family history of ischemic heart disease and other diseases of the circulatory system: Secondary | ICD-10-CM | POA: Diagnosis not present

## 2013-08-11 DIAGNOSIS — C801 Malignant (primary) neoplasm, unspecified: Secondary | ICD-10-CM | POA: Diagnosis not present

## 2013-08-11 DIAGNOSIS — I4891 Unspecified atrial fibrillation: Secondary | ICD-10-CM | POA: Diagnosis present

## 2013-08-11 DIAGNOSIS — L589 Radiodermatitis, unspecified: Secondary | ICD-10-CM | POA: Diagnosis not present

## 2013-08-11 DIAGNOSIS — E871 Hypo-osmolality and hyponatremia: Secondary | ICD-10-CM | POA: Diagnosis present

## 2013-08-11 DIAGNOSIS — M79609 Pain in unspecified limb: Secondary | ICD-10-CM | POA: Diagnosis not present

## 2013-08-11 DIAGNOSIS — C3490 Malignant neoplasm of unspecified part of unspecified bronchus or lung: Secondary | ICD-10-CM | POA: Diagnosis present

## 2013-08-11 DIAGNOSIS — J4489 Other specified chronic obstructive pulmonary disease: Secondary | ICD-10-CM | POA: Diagnosis present

## 2013-08-11 LAB — PROTIME-INR
INR: 1.09 (ref 0.00–1.49)
Prothrombin Time: 13.9 seconds (ref 11.6–15.2)

## 2013-08-11 LAB — I-STAT CHEM 8, ED
BUN: 8 mg/dL (ref 6–23)
CALCIUM ION: 1.1 mmol/L — AB (ref 1.13–1.30)
CHLORIDE: 90 meq/L — AB (ref 96–112)
Creatinine, Ser: 0.6 mg/dL (ref 0.50–1.10)
Glucose, Bld: 101 mg/dL — ABNORMAL HIGH (ref 70–99)
HCT: 33 % — ABNORMAL LOW (ref 36.0–46.0)
Hemoglobin: 11.2 g/dL — ABNORMAL LOW (ref 12.0–15.0)
Potassium: 4.3 mEq/L (ref 3.7–5.3)
SODIUM: 131 meq/L — AB (ref 137–147)
TCO2: 31 mmol/L (ref 0–100)

## 2013-08-11 LAB — CBC WITH DIFFERENTIAL/PLATELET
Basophils Absolute: 0 10*3/uL (ref 0.0–0.1)
Basophils Relative: 0 % (ref 0–1)
Eosinophils Absolute: 0.1 10*3/uL (ref 0.0–0.7)
Eosinophils Relative: 1 % (ref 0–5)
HEMATOCRIT: 29.6 % — AB (ref 36.0–46.0)
Hemoglobin: 10.1 g/dL — ABNORMAL LOW (ref 12.0–15.0)
LYMPHS PCT: 5 % — AB (ref 12–46)
Lymphs Abs: 0.5 10*3/uL — ABNORMAL LOW (ref 0.7–4.0)
MCH: 31.9 pg (ref 26.0–34.0)
MCHC: 34.1 g/dL (ref 30.0–36.0)
MCV: 93.4 fL (ref 78.0–100.0)
MONO ABS: 0.7 10*3/uL (ref 0.1–1.0)
Monocytes Relative: 8 % (ref 3–12)
NEUTROS ABS: 8.1 10*3/uL — AB (ref 1.7–7.7)
Neutrophils Relative %: 87 % — ABNORMAL HIGH (ref 43–77)
Platelets: 372 10*3/uL (ref 150–400)
RBC: 3.17 MIL/uL — AB (ref 3.87–5.11)
RDW: 14.1 % (ref 11.5–15.5)
WBC: 9.4 10*3/uL (ref 4.0–10.5)

## 2013-08-11 LAB — I-STAT TROPONIN, ED
TROPONIN I, POC: 0 ng/mL (ref 0.00–0.08)
TROPONIN I, POC: 0 ng/mL (ref 0.00–0.08)

## 2013-08-11 LAB — STREP PNEUMONIAE URINARY ANTIGEN: STREP PNEUMO URINARY ANTIGEN: NEGATIVE

## 2013-08-11 MED ORDER — PANTOPRAZOLE SODIUM 40 MG PO TBEC
40.0000 mg | DELAYED_RELEASE_TABLET | Freq: Every day | ORAL | Status: DC
  Administered 2013-08-12 – 2013-08-15 (×4): 40 mg via ORAL
  Filled 2013-08-11 (×4): qty 1

## 2013-08-11 MED ORDER — AMIODARONE HCL 200 MG PO TABS
200.0000 mg | ORAL_TABLET | Freq: Two times a day (BID) | ORAL | Status: DC
  Administered 2013-08-12 – 2013-08-15 (×7): 200 mg via ORAL
  Filled 2013-08-11 (×8): qty 1

## 2013-08-11 MED ORDER — ADULT MULTIVITAMIN W/MINERALS CH
1.0000 | ORAL_TABLET | Freq: Every day | ORAL | Status: DC
  Administered 2013-08-12 – 2013-08-14 (×3): 1 via ORAL
  Filled 2013-08-11 (×5): qty 1

## 2013-08-11 MED ORDER — AMIODARONE HCL IN DEXTROSE 360-4.14 MG/200ML-% IV SOLN
60.0000 mg/h | Freq: Once | INTRAVENOUS | Status: AC
  Administered 2013-08-11: 60 mg/h via INTRAVENOUS
  Filled 2013-08-11: qty 200

## 2013-08-11 MED ORDER — ALBUTEROL SULFATE (2.5 MG/3ML) 0.083% IN NEBU: 2.5000 mg | INHALATION_SOLUTION | Freq: Four times a day (QID) | RESPIRATORY_TRACT | Status: DC | PRN

## 2013-08-11 MED ORDER — VANCOMYCIN HCL 500 MG IV SOLR
500.0000 mg | Freq: Two times a day (BID) | INTRAVENOUS | Status: DC
  Administered 2013-08-12 – 2013-08-14 (×6): 500 mg via INTRAVENOUS
  Filled 2013-08-11 (×7): qty 500

## 2013-08-11 MED ORDER — AMIODARONE HCL IN DEXTROSE 360-4.14 MG/200ML-% IV SOLN
30.0000 mg/h | INTRAVENOUS | Status: AC
  Administered 2013-08-12 (×2): 30 mg/h via INTRAVENOUS
  Filled 2013-08-11 (×3): qty 200

## 2013-08-11 MED ORDER — DILTIAZEM HCL 25 MG/5ML IV SOLN
15.0000 mg | Freq: Once | INTRAVENOUS | Status: AC
Start: 2013-08-11 — End: 2013-08-11
  Administered 2013-08-11: 15 mg via INTRAVENOUS
  Filled 2013-08-11: qty 5

## 2013-08-11 MED ORDER — AMIODARONE HCL 200 MG PO TABS: 200.0000 mg | ORAL_TABLET | Freq: Every day | ORAL | Status: DC

## 2013-08-11 MED ORDER — ENSURE COMPLETE PO LIQD
237.0000 mL | Freq: Two times a day (BID) | ORAL | Status: DC
  Administered 2013-08-12 – 2013-08-15 (×7): 237 mL via ORAL

## 2013-08-11 MED ORDER — AMIODARONE HCL IN DEXTROSE 360-4.14 MG/200ML-% IV SOLN
60.0000 mg/h | INTRAVENOUS | Status: AC
  Administered 2013-08-12: 60 mg/h via INTRAVENOUS
  Filled 2013-08-11: qty 200

## 2013-08-11 MED ORDER — DILTIAZEM HCL 25 MG/5ML IV SOLN: 15.0000 mg | Freq: Once | INTRAVENOUS | Status: DC

## 2013-08-11 MED ORDER — ALPRAZOLAM 0.25 MG PO TABS
0.5000 mg | ORAL_TABLET | Freq: Two times a day (BID) | ORAL | Status: DC | PRN
  Administered 2013-08-11 – 2013-08-15 (×7): 0.5 mg via ORAL
  Filled 2013-08-11 (×8): qty 2

## 2013-08-11 MED ORDER — HYDROCODONE-ACETAMINOPHEN 5-325 MG PO TABS
1.0000 | ORAL_TABLET | Freq: Four times a day (QID) | ORAL | Status: DC | PRN
  Administered 2013-08-12: 1 via ORAL
  Filled 2013-08-11: qty 1

## 2013-08-11 MED ORDER — AMIODARONE IV BOLUS ONLY 150 MG/100ML: 150.0000 mg | Freq: Once | INTRAVENOUS | Status: DC

## 2013-08-11 MED ORDER — FOLIC ACID 1 MG PO TABS
1.0000 mg | ORAL_TABLET | Freq: Every day | ORAL | Status: DC
  Administered 2013-08-12 – 2013-08-15 (×4): 1 mg via ORAL
  Filled 2013-08-11 (×5): qty 1

## 2013-08-11 MED ORDER — SODIUM CHLORIDE 0.9 % IV BOLUS (SEPSIS)
1000.0000 mL | Freq: Once | INTRAVENOUS | Status: AC
  Administered 2013-08-11: 1000 mL via INTRAVENOUS

## 2013-08-11 MED ORDER — AMIODARONE LOAD VIA INFUSION
150.0000 mg | Freq: Once | INTRAVENOUS | Status: AC
  Administered 2013-08-11: 150 mg via INTRAVENOUS
  Filled 2013-08-11: qty 83.34

## 2013-08-11 MED ORDER — LEVALBUTEROL HCL 0.63 MG/3ML IN NEBU: 0.6300 mg | INHALATION_SOLUTION | RESPIRATORY_TRACT | Status: DC | PRN

## 2013-08-11 MED ORDER — METOPROLOL TARTRATE 25 MG PO TABS
25.0000 mg | ORAL_TABLET | Freq: Two times a day (BID) | ORAL | Status: DC
  Administered 2013-08-11 – 2013-08-15 (×8): 25 mg via ORAL
  Filled 2013-08-11 (×9): qty 1

## 2013-08-11 MED ORDER — VITAMIN B-1 100 MG PO TABS
100.0000 mg | ORAL_TABLET | Freq: Every day | ORAL | Status: DC
  Administered 2013-08-12 – 2013-08-14 (×3): 100 mg via ORAL
  Filled 2013-08-11 (×5): qty 1

## 2013-08-11 MED ORDER — APIXABAN 5 MG PO TABS
5.0000 mg | ORAL_TABLET | Freq: Two times a day (BID) | ORAL | Status: DC
  Administered 2013-08-11 – 2013-08-15 (×8): 5 mg via ORAL
  Filled 2013-08-11 (×9): qty 1

## 2013-08-11 MED ORDER — DILTIAZEM HCL 100 MG IV SOLR
5.0000 mg/h | Freq: Once | INTRAVENOUS | Status: AC
  Administered 2013-08-11: 5 mg/h via INTRAVENOUS

## 2013-08-11 MED ORDER — IOHEXOL 350 MG/ML SOLN
100.0000 mL | Freq: Once | INTRAVENOUS | Status: AC | PRN
  Administered 2013-08-11: 100 mL via INTRAVENOUS

## 2013-08-11 MED ORDER — CEFEPIME HCL 1 G IJ SOLR
1.0000 g | Freq: Three times a day (TID) | INTRAMUSCULAR | Status: DC
  Administered 2013-08-11 – 2013-08-15 (×11): 1 g via INTRAVENOUS
  Filled 2013-08-11 (×13): qty 1

## 2013-08-11 NOTE — Telephone Encounter (Signed)
Ms. Stephanie Oliver called having questions about insurance.  I listened as she talked about pt insurance.  I gave her Darlena Clarks's name and phone number.  I stated she is a financial advocate and could help.  She stated she would call her.

## 2013-08-11 NOTE — ED Notes (Signed)
Pt brought to ED by EMS with racing heart.Pt was in Doctors office this afternoon and was on fast Afib.Pt denies any chest pain or SOB.

## 2013-08-11 NOTE — Telephone Encounter (Signed)
called pt and talked to daughter gave her appt for 08/16/13 lab,md and chemo

## 2013-08-11 NOTE — Progress Notes (Signed)
ANTIBIOTIC CONSULT NOTE - INITIAL  Pharmacy Consult for Vancomycin and Cefepime Indication: HCAP  Allergies  Allergen Reactions  . Paxil [Paroxetine] Diarrhea, Nausea Only and Other (See Comments)    Also dizziness  . Lisinopril Diarrhea and Nausea Only  . Penicillins Hives    Patient Measurements: Height: 5\' 3"  (160 cm) Weight: 132 lb 15 oz (60.3 kg) IBW/kg (Calculated) : 52.4  Vital Signs: Temp: 97.8 F (36.6 C) (02/25 2146) Temp src: Oral (02/25 2146) BP: 121/71 mmHg (02/25 2146) Pulse Rate: 112 (02/25 2146) Intake/Output from previous day:   Intake/Output from this shift: Total I/O In: -  Out: 400 [Urine:400]  Labs:  Recent Labs  08/09/13 0906 08/09/13 0907 08/11/13 1730 08/11/13 1744  WBC 11.0*  --  9.4  --   HGB 10.7*  --  10.1* 11.2*  PLT 399  --  372  --   CREATININE  --  0.6  --  0.60   Estimated Creatinine Clearance: 58.8 ml/min (by C-G formula based on Cr of 0.6). No results found for this basename: VANCOTROUGH, VANCOPEAK, VANCORANDOM, GENTTROUGH, GENTPEAK, GENTRANDOM, TOBRATROUGH, TOBRAPEAK, TOBRARND, AMIKACINPEAK, AMIKACINTROU, AMIKACIN,  in the last 72 hours   Microbiology: No results found for this or any previous visit (from the past 720 hour(s)).  Medical History: Past Medical History  Diagnosis Date  . Depression   . Panic attacks   . Hypertension   . Smoker   . Alcohol abuse     Medications:  Prescriptions prior to admission  Medication Sig Dispense Refill  . albuterol (PROVENTIL HFA;VENTOLIN HFA) 108 (90 BASE) MCG/ACT inhaler Inhale 1-2 puffs into the lungs every 6 (six) hours as needed for wheezing or shortness of breath.      . ALPRAZolam (XANAX) 0.5 MG tablet Take 1 tablet (0.5 mg total) by mouth 2 (two) times daily as needed for anxiety.  30 tablet  0  . amiodarone (PACERONE) 100 MG tablet Take 100 mg by mouth 2 (two) times daily.      Marland Kitchen apixaban (ELIQUIS) 5 MG TABS tablet Take 1 tablet (5 mg total) by mouth 2 (two) times  daily.  60 tablet  1  . diclofenac sodium (VOLTAREN) 1 % GEL Apply 4 g topically 4 (four) times daily as needed (for pain).      Marland Kitchen diphenoxylate-atropine (LOMOTIL) 2.5-0.025 MG per tablet Take 1 tablet by mouth 4 (four) times daily as needed for diarrhea or loose stools.      . docusate sodium (COLACE) 100 MG capsule Take 1 capsule (100 mg total) by mouth 2 (two) times daily. Continue this while taking narcotics to help with bowel movements  30 capsule  1  . feeding supplement, ENSURE COMPLETE, (ENSURE COMPLETE) LIQD Take 237 mLs by mouth 2 (two) times daily between meals.  60 Bottle  6  . folic acid (FOLVITE) 1 MG tablet Take 1 tablet (1 mg total) by mouth daily.  30 tablet  1  . furosemide (LASIX) 40 MG tablet Take 1 tablet (40 mg total) by mouth daily.  30 tablet  1  . HYDROcodone-acetaminophen (NORCO/VICODIN) 5-325 MG per tablet Take 1 tablet by mouth every 6 (six) hours as needed for moderate pain.      . iron polysaccharides (NIFEREX) 150 MG capsule Take 1 capsule (150 mg total) by mouth daily.  30 capsule  1  . methocarbamol (ROBAXIN) 500 MG tablet Take 1 tablet (500 mg total) by mouth every 6 (six) hours as needed for muscle spasms.  30 tablet  0  . metoprolol tartrate (LOPRESSOR) 25 MG tablet Take 25 mg by mouth 2 (two) times daily.      . potassium chloride SA (K-DUR,KLOR-CON) 20 MEQ tablet Take 1 tablet (20 mEq total) by mouth 2 (two) times daily.  60 tablet  1  . sertraline (ZOLOFT) 100 MG tablet Take 150 mg by mouth daily.       Marland Kitchen thiamine 100 MG tablet Take 1 tablet (100 mg total) by mouth daily.  30 tablet  1  . amiodarone (PACERONE) 400 MG tablet Take 400 mg by mouth 2 (two) times daily.      Marland Kitchen levalbuterol (XOPENEX) 0.63 MG/3ML nebulizer solution Take 0.63 mg by nebulization every 4 (four) hours as needed for wheezing or shortness of breath.      . metoprolol succinate (TOPROL-XL) 100 MG 24 hr tablet Take 25 mg by mouth daily.       . Multiple Vitamin (MULTIVITAMIN WITH MINERALS)  TABS tablet Take 1 tablet by mouth daily.       Assessment: 63 y.o. female presents with afib/RVR. 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 CT chest shows post-obstructive PNA. To begin broad spectrum antibiotics (Vancomycin and Cefepime). Renal function appears stable.  Goal of Therapy:  Vancomycin trough level 15-20 mcg/ml  Plan:  1. Continue Cefepime 1gm IV q8h 2. Vancomycin 500mg  IV q12h 3. Will f/u renal function, micro data, pt's clinical condition, trough prn  Sherlon Handing, PharmD, BCPS Clinical pharmacist, pager [phone] 08/11/2013,10:09 PM

## 2013-08-11 NOTE — [facility] Oncology Navigator Note (Unsigned)
Patients care giver called me and was in [place].  Care giver stated she has papers that need to be given to Dr. Julien Nordmann.  I saw patient and care giver in [place].  I got papers and hand delivered to [facility].  I informed pt and care giver that Charlena Cross will complete forms.  Care giver left phone number to call when papers available for pick up.

## 2013-08-11 NOTE — ED Provider Notes (Signed)
CSN: [id number]     Arrival date & time 08/11/13  1710 History   First MD Initiated Contact with Patient 08/11/13 1724     Chief Complaint  Patient presents with  . Atrial Fibrillation     HPI  65 yo F 63 y.o. female with history of alcohol abuse, depression with anxiety, smoker, recent hip fractrure and newly diagnosed lung CA who presents from Dr. Terrence Dupont office (cardiology) where patient was noted to be in A-fib RVR. Patient was asked to take an extra amiodarone tablet and she did. She presented to ED for check up. Patient her self denies any sxs. She specifically denies any CP, SOB, chest discomfort. Or chest pressure. She denies any nausea , emesis, or diaphoresis. She states she is sx free. Patient does state she has been having daily loose stools and today had 4 bouts of liquid stools. She has attributed the diarrhea to the chemotherapy.     Past Medical History  Diagnosis Date  . Depression   . Panic attacks   . Hypertension   . Smoker   . Alcohol abuse   . Cancer    Past Surgical History  Procedure Laterality Date  . Tonsillectomy    . Back surgery    . Femur im nail Right 06/22/2013    Procedure: RIGHT HIP GAMMA NAIL FIXATION   ;  Surgeon: Renette Butters, MD;  Location: [facility];  Service: Orthopedics;  Laterality: Right;  . Video bronchoscopy with endobronchial ultrasound N/A 06/22/2013    Procedure: VIDEO BRONCHOSCOPY WITH ENDOBRONCHIAL ULTRASOUND;  Surgeon: Doree Fudge, MD;  Location: [facility];  Service: Pulmonary;  Laterality: N/A;   Family History  Problem Relation Age of Onset  . CAD Father   . Arthritis Mother    History  Substance Use Topics  . Smoking status: Former Smoker -- 1.50 packs/day    Types: Cigarettes    Quit date: 06/18/2013  . Smokeless tobacco: Never Used  . Alcohol Use: 2.4 oz/week    4 Shots of liquor per week     Comment: patient quit drinking 06/18/2013   OB History   Grav Para Term Preterm Abortions TAB SAB Ect Mult Living                  Review of Systems  Constitutional: Negative for fever, chills, activity change and appetite change.  HENT: Negative for congestion, ear pain and rhinorrhea.   Eyes: Negative for pain.  Respiratory: Negative for cough and shortness of breath.   Cardiovascular: Positive for palpitations. Negative for chest pain.  Gastrointestinal: Negative for nausea, vomiting and abdominal pain.  Genitourinary: Negative for dysuria, difficulty urinating and pelvic pain.  Musculoskeletal: Negative for back pain and neck pain.  Skin: Negative for rash and wound.  Neurological: Negative for weakness and headaches.  Psychiatric/Behavioral: Negative for behavioral problems, confusion and agitation.      Allergies  Paxil; Lisinopril; and Penicillins  Home Medications   No current outpatient prescriptions on file. BP 121/71  Pulse 112  Temp(Src) 97.8 F (36.6 C) (Oral)  Resp 20  Ht 5\' 3"  (1.6 m)  Wt 132 lb 15 oz (60.3 kg)  BMI 23.55 kg/m2  SpO2 97% Physical Exam  Constitutional: She is oriented to person, place, and time. She appears well-developed and well-nourished. No distress.  HENT:  Head: Normocephalic and atraumatic.  Nose: Nose normal.  Mouth/Throat: Oropharynx is clear and moist.  Eyes: EOM are normal. Pupils are equal, round, and reactive to light.  Neck: Normal range of motion. Neck supple. No tracheal deviation present.  Cardiovascular: Normal heart sounds and intact distal pulses.   afib rvr   Pulmonary/Chest: Effort normal and breath sounds normal. She has no rales.  Abdominal: Soft. Bowel sounds are normal. She exhibits no distension. There is no tenderness. There is no rebound and no guarding.  Musculoskeletal: Normal range of motion. She exhibits no tenderness.  Neurological: She is alert and oriented to person, place, and time.  Skin: Skin is warm and dry. No rash noted.  Psychiatric: She has a normal mood and affect. Her behavior is normal.    ED Course   Procedures (including critical care time) Labs Review Labs Reviewed  CBC WITH DIFFERENTIAL - Abnormal; Notable for the following:    RBC 3.17 (*)    Hemoglobin 10.1 (*)    HCT 29.6 (*)    Neutrophils Relative % 87 (*)    Neutro Abs 8.1 (*)    Lymphocytes Relative 5 (*)    Lymphs Abs 0.5 (*)    All other components within normal limits  I-STAT CHEM 8, ED - Abnormal; Notable for the following:    Sodium 131 (*)    Chloride 90 (*)    Glucose, Bld 101 (*)    Calcium, Ion 1.10 (*)    Hemoglobin 11.2 (*)    HCT 33.0 (*)    All other components within normal limits  CULTURE, BLOOD (ROUTINE X 2)  CULTURE, BLOOD (ROUTINE X 2)  GRAM STAIN  CULTURE, EXPECTORATED SPUTUM-ASSESSMENT  PROTIME-INR  HIV ANTIBODY (ROUTINE TESTING)  LEGIONELLA ANTIGEN, URINE  STREP PNEUMONIAE URINARY ANTIGEN  CBC  BASIC METABOLIC PANEL  I-STAT TROPOININ, ED  I-STAT TROPOININ, ED  I-STAT TROPOININ, ED   Imaging Review Ct Angio Chest Pe W/cm &/or Wo Cm  08/11/2013   CLINICAL DATA:  Known right lung mass.  Atrial fibrillation.  EXAM: CT ANGIOGRAPHY CHEST WITH CONTRAST  TECHNIQUE: Multidetector CT imaging of the chest was performed using the standard protocol during bolus administration of intravenous contrast. Multiplanar CT image reconstructions and MIPs were obtained to evaluate the vascular anatomy.  CONTRAST:  1105mL OMNIPAQUE IOHEXOL 350 MG/ML SOLN  COMPARISON:  CT chest June 21, 2013  FINDINGS: There is a heterogeneous mass measuring 7 0.1 x 5 x 6.4 cm in the immediate aspect of the right upper lobe encasing the right upper lobe pulmonary artery with mass effect on adjacent superior vena cava unchanged compared to prior exam. There are several additional nodules within the right upper lobe which are unchanged compared to prior exam. There is interval developed patchy consolidation of the inferior aspect of the right upper lobe probably postobstructive pneumonitis. There is interval developed consolidation of  the posterior right lower lobe with associated small right pleural effusion. The previously noted large necrotic right hilar lymph node with mass effect on the adjacent right pulmonary artery and its branches is unchanged compared to prior exam. The left lung is clear. There is no pulmonary embolus. The visualized upper abdominal structures are unremarkable. Degenerative joint changes of the spine are identified.  Review of the MIP images confirms the above findings.  IMPRESSION: No pulmonary embolus.  Previously noted right lung mass and nodules as well as metastatic lymph nodes are unchanged.  There is new consolidation of the inferior right upper lobe probably secondary to postobstructive pneumonitis.  Posterior right lower lobe pneumonia with adjacent small right pleural effusion.   Electronically Signed   By: Mallie Darting.D.  On: 08/11/2013 20:01   Dg Chest Portable 1 View  08/11/2013   CLINICAL DATA:  ATRIAL FIBRILLATION  EXAM: PORTABLE CHEST - 1 VIEW  COMPARISON:  NM PET IMAGE INITIAL (PI) SKULL BASE TO THIGH dated 08/04/2013; DG CHEST 2 VIEW dated 06/27/2013; DG CHEST 1V PORT dated 06/26/2013; CT CHEST W/CM dated 06/21/2013  FINDINGS: There is elevation of the right hemidiaphragm but stable. Mild increased density projects in the right lung base. A right peritracheal masses again identified as well as a right hilar mass. Calcified appearing nodule projects in the right lung apex stable from prior study likely representing a calcified granuloma. Stable small calcified densities project within the left lung apex again likely reflecting granulomata. There is prominence of the interstitial markings. There is no appreciable peribronchial cuffing. No further focal regions of consolidation or focal infiltrates appreciated. The cardiac silhouette is the upper limits of normal. Atherosclerotic calcification identified within the aorta. The osseous structures are unremarkable.  IMPRESSION: Interstitial prominence  may reflect sequela of pulmonary vascular congestion.  Atelectasis versus infiltrate versus possibly vascular crowding right lung base.  Stable masses in the right paratracheal and right hilar regions.   Electronically Signed   By: Margaree Mackintosh M.D.   On: 08/11/2013 18:04      MDM   Final diagnoses:  PNA AFIB RVR hyponatremia     63 yo F who was noted to be in AFIB RVR earlier today on arrival she is sinus tachycardic. Will order CT PE study as patient is high risk for PE. Pt is currently asymptomatic.   During ED course patient pt developed Afib RVR. She received Cardizem bolus ( 15 mg) and was started on a gtt. Dr. Terrence Dupont was consulted and he recommended amiodarone 150 mg and starting a gtt. Plan to admit for furhter care.   9:26 PM Bed requested. Patient admitted in stable condition. Throughout discussion with patient on plan and findings.  CT scan with noted PNA will start on ABX. At transfer patient is HDS.   Case co managed with my attending Dr. Evette Georges, MD 08/11/13 [phone]

## 2013-08-11 NOTE — H&P (Signed)
Stephanie Oliver is an 63 y.o. female.   Chief Complaint: Generalized tired feeling and weakness HPI: Patient is 63 year old female with past medical history significant for multiple medical problems i.e. 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 fib with RVR patient denies any palpitation lightheadedness or syncope denies any chest pain nausea or vomiting diaphoresis. Patient denies any hemoptysis patient tolerating her request as outpatient patient was advised to take extra dose of amiodarone today and increase amiodarone to 400 mg twice daily patient went home and came to the ER as she was concerned about her fast heart rate. Patient spontaneously on beta ER converted back to sinus rhythm and while waiting for the labs in the ED again went into A. fib with RVR her. Patient had CT of the chest which showed no evidence of PE but showed post obstructive pneumonia. Patient complains of cough with whitish phlegm denies any fever or chills but states overall feels weak and tired. Patient states she had chemotherapy on Monday and had radiation today. Patient was started on IV amiodarone and drip with control of heart rate in one teens. But still remains in A. fib.  Past Medical History  Diagnosis Date  . Depression   . Panic attacks   . Hypertension   . Smoker   . Alcohol abuse     Past Surgical History  Procedure Laterality Date  . Tonsillectomy    . Back surgery    . Femur im nail Right 06/22/2013    Procedure: RIGHT HIP GAMMA NAIL FIXATION   ;  Surgeon: Renette Butters, MD;  Location: [facility];  Service: Orthopedics;  Laterality: Right;  . Video bronchoscopy with endobronchial ultrasound N/A 06/22/2013    Procedure: VIDEO BRONCHOSCOPY WITH ENDOBRONCHIAL  ULTRASOUND;  Surgeon: Doree Fudge, MD;  Location: [facility];  Service: Pulmonary;  Laterality: N/A;    Family History  Problem Relation Age of Onset  . CAD Father   . Arthritis Mother    Social History:  reports that she quit smoking about 7 weeks ago. Her smoking use included Cigarettes. She smoked 1.50 packs per day. She has never used smokeless tobacco. She reports that she drinks about 2.4 ounces of alcohol per week. She reports that she does not use illicit drugs.  Allergies:  Allergies  Allergen Reactions  . Paxil [Paroxetine] Diarrhea, Nausea Only and Other (See Comments)    Also dizziness  . Lisinopril Diarrhea and Nausea Only  . Penicillins Hives     (Not in a hospital admission)  Results for orders placed during the hospital encounter of 08/11/13 (from the past 48 hour(s))  CBC WITH DIFFERENTIAL     Status: Abnormal   Collection Time    08/11/13  5:30 PM      Result Value Ref Range   WBC 9.4  4.0 - 10.5 K/uL   RBC 3.17 (*) 3.87 - 5.11 MIL/uL   Hemoglobin 10.1 (*) 12.0 - 15.0 g/dL   HCT 29.6 (*) 36.0 - 46.0 %   MCV 93.4  78.0 - 100.0 fL   MCH 31.9  26.0 - 34.0 pg   MCHC 34.1  30.0 - 36.0 g/dL   RDW 14.1  11.5 - 15.5 %   Platelets 372  150 - 400 K/uL  Neutrophils Relative % 87 (*) 43 - 77 %   Neutro Abs 8.1 (*) 1.7 - 7.7 K/uL   Lymphocytes Relative 5 (*) 12 - 46 %   Lymphs Abs 0.5 (*) 0.7 - 4.0 K/uL   Monocytes Relative 8  3 - 12 %   Monocytes Absolute 0.7  0.1 - 1.0 K/uL   Eosinophils Relative 1  0 - 5 %   Eosinophils Absolute 0.1  0.0 - 0.7 K/uL   Basophils Relative 0  0 - 1 %   Basophils Absolute 0.0  0.0 - 0.1 K/uL  I-STAT TROPOININ, ED     Status: None   Collection Time    08/11/13  5:43 PM      Result Value Ref Range   Troponin i, poc 0.00  0.00 - 0.08 ng/mL   Comment 3            Comment: Due to the release kinetics of cTnI,     a negative result within the first hours     of the onset of symptoms does not rule out     myocardial  infarction with certainty.     If myocardial infarction is still suspected,     repeat the test at appropriate intervals.  I-STAT CHEM 8, ED     Status: Abnormal   Collection Time    08/11/13  5:44 PM      Result Value Ref Range   Sodium 131 (*) 137 - 147 mEq/L   Potassium 4.3  3.7 - 5.3 mEq/L   Chloride 90 (*) 96 - 112 mEq/L   BUN 8  6 - 23 mg/dL   Creatinine, Ser 0.60  0.50 - 1.10 mg/dL   Glucose, Bld 101 (*) 70 - 99 mg/dL   Calcium, Ion 1.10 (*) 1.13 - 1.30 mmol/L   TCO2 31  0 - 100 mmol/L   Hemoglobin 11.2 (*) 12.0 - 15.0 g/dL   HCT 33.0 (*) 36.0 - 46.0 %   Ct Angio Chest Pe W/cm &/or Wo Cm  08/11/2013   CLINICAL DATA:  Known right lung mass.  Atrial fibrillation.  EXAM: CT ANGIOGRAPHY CHEST WITH CONTRAST  TECHNIQUE: Multidetector CT imaging of the chest was performed using the standard protocol during bolus administration of intravenous contrast. Multiplanar CT image reconstructions and MIPs were obtained to evaluate the vascular anatomy.  CONTRAST:  173mL OMNIPAQUE IOHEXOL 350 MG/ML SOLN  COMPARISON:  CT chest June 21, 2013  FINDINGS: There is a heterogeneous mass measuring 7 0.1 x 5 x 6.4 cm in the immediate aspect of the right upper lobe encasing the right upper lobe pulmonary artery with mass effect on adjacent superior vena cava unchanged compared to prior exam. There are several additional nodules within the right upper lobe which are unchanged compared to prior exam. There is interval developed patchy consolidation of the inferior aspect of the right upper lobe probably postobstructive pneumonitis. There is interval developed consolidation of the posterior right lower lobe with associated small right pleural effusion. The previously noted large necrotic right hilar lymph node with mass effect on the adjacent right pulmonary artery and its branches is unchanged compared to prior exam. The left lung is clear. There is no pulmonary embolus. The visualized upper abdominal structures are  unremarkable. Degenerative joint changes of the spine are identified.  Review of the MIP images confirms the above findings.  IMPRESSION: No pulmonary embolus.  Previously noted right lung mass and nodules as well as metastatic lymph nodes are  unchanged.  There is new consolidation of the inferior right upper lobe probably secondary to postobstructive pneumonitis.  Posterior right lower lobe pneumonia with adjacent small right pleural effusion.   Electronically Signed   By: Abelardo Diesel M.D.   On: 08/11/2013 20:01   Dg Chest Portable 1 View  08/11/2013   CLINICAL DATA:  ATRIAL FIBRILLATION  EXAM: PORTABLE CHEST - 1 VIEW  COMPARISON:  NM PET IMAGE INITIAL (PI) SKULL BASE TO THIGH dated 08/04/2013; DG CHEST 2 VIEW dated 06/27/2013; DG CHEST 1V PORT dated 06/26/2013; CT CHEST W/CM dated 06/21/2013  FINDINGS: There is elevation of the right hemidiaphragm but stable. Mild increased density projects in the right lung base. A right peritracheal masses again identified as well as a right hilar mass. Calcified appearing nodule projects in the right lung apex stable from prior study likely representing a calcified granuloma. Stable small calcified densities project within the left lung apex again likely reflecting granulomata. There is prominence of the interstitial markings. There is no appreciable peribronchial cuffing. No further focal regions of consolidation or focal infiltrates appreciated. The cardiac silhouette is the upper limits of normal. Atherosclerotic calcification identified within the aorta. The osseous structures are unremarkable.  IMPRESSION: Interstitial prominence may reflect sequela of pulmonary vascular congestion.  Atelectasis versus infiltrate versus possibly vascular crowding right lung base.  Stable masses in the right paratracheal and right hilar regions.   Electronically Signed   By: Margaree Mackintosh M.D.   On: 08/11/2013 18:04    Review of Systems  Constitutional: Positive for malaise/fatigue.  Negative for fever and chills.  Eyes: Negative for double vision and photophobia.  Respiratory: Positive for cough. Negative for hemoptysis, sputum production and shortness of breath.   Cardiovascular: Negative for chest pain and palpitations.  Gastrointestinal: Positive for vomiting. Negative for nausea.  Genitourinary: Negative for dysuria.  Neurological: Positive for weakness. Negative for dizziness and headaches.    Blood pressure 112/70, pulse 115, temperature 98.8 F (37.1 C), temperature source Oral, resp. rate 27, height 5\' 3"  (1.6 m), weight 57.607 kg (127 lb), SpO2 96.00%. Physical Exam  Constitutional: She is oriented to person, place, and time.  Eyes: Conjunctivae are normal. Left eye exhibits no discharge. No scleral icterus.  Neck: Normal range of motion. Neck supple. No JVD present. No tracheal deviation present. No thyromegaly present.  Cardiovascular:  Irregularly irregular S1 and S2 soft there is soft systolic murmur noted no S3 gallop  Respiratory:  Decrease breath sounds at right base  GI: Soft. Bowel sounds are normal. She exhibits no distension. There is no tenderness.  Musculoskeletal:  No clubbing cyanosis trace edema noted  Neurological: She is alert and oriented to person, place, and time.     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

## 2013-08-12 ENCOUNTER — Ambulatory Visit

## 2013-08-12 LAB — HIV ANTIBODY (ROUTINE TESTING W REFLEX): HIV: NONREACTIVE

## 2013-08-12 NOTE — Progress Notes (Signed)
Subjective:  Patient complains of cough with whitish yellow phlegm. States overall feels better. Converted back into sinus rhythm and remains in sinus rhythm.  Objective:  Vital Signs in the last 24 hours: Temp:  [97.8 F (36.6 C)-98.8 F (37.1 C)] 98 F (36.7 C) (02/26 0410) Pulse Rate:  [94-155] 97 (02/26 1053) Resp:  [17-36] 20 (02/26 1053) BP: (88-121)/(49-71) 115/63 mmHg (02/26 1053) SpO2:  [92 %-99 %] 92 % (02/26 0410) Weight:  [57.607 kg (127 lb)-60.3 kg (132 lb 15 oz)] 60.3 kg (132 lb 15 oz) (02/25 2146)  Intake/Output from previous day: 02/25 0701 - 02/26 0700 In: -  Out: 900 [Urine:900] Intake/Output from this shift: Total I/O In: 649.2 [P.O.:480; I.V.:69.2; IV Piggyback:100] Out: 500 [Urine:500]  Physical Exam: Neck: no adenopathy, no carotid bruit, no JVD and supple, symmetrical, trachea midline Lungs: Decreased breath sounds at right base with occasional rhonchi Heart: regular rate and rhythm, S1, S2 normal and Soft systolic murmur noted Abdomen: soft, non-tender; bowel sounds normal; no masses,  no organomegaly Extremities: extremities normal, atraumatic, no cyanosis or edema  Lab Results:  Recent Labs  08/11/13 1730 08/11/13 1744  WBC 9.4  --   HGB 10.1* 11.2*  PLT 372  --     Recent Labs  08/11/13 1744  NA 131*  K 4.3  CL 90*  GLUCOSE 101*  BUN 8  CREATININE 0.60   No results found for this basename: TROPONINI, CK, MB,  in the last 72 hours Hepatic Function Panel No results found for this basename: PROT, ALBUMIN, AST, ALT, ALKPHOS, BILITOT, BILIDIR, IBILI,  in the last 72 hours No results found for this basename: CHOL,  in the last 72 hours No results found for this basename: PROTIME,  in the last 72 hours  Imaging: Imaging results have been reviewed and Ct Angio Chest Pe W/cm &/or Wo Cm  08/11/2013   CLINICAL DATA:  Known right lung mass.  Atrial fibrillation.  EXAM: CT ANGIOGRAPHY CHEST WITH CONTRAST  TECHNIQUE: Multidetector CT imaging of  the chest was performed using the standard protocol during bolus administration of intravenous contrast. Multiplanar CT image reconstructions and MIPs were obtained to evaluate the vascular anatomy.  CONTRAST:  158mL OMNIPAQUE IOHEXOL 350 MG/ML SOLN  COMPARISON:  CT chest June 21, 2013  FINDINGS: There is a heterogeneous mass measuring 7 0.1 x 5 x 6.4 cm in the immediate aspect of the right upper lobe encasing the right upper lobe pulmonary artery with mass effect on adjacent superior vena cava unchanged compared to prior exam. There are several additional nodules within the right upper lobe which are unchanged compared to prior exam. There is interval developed patchy consolidation of the inferior aspect of the right upper lobe probably postobstructive pneumonitis. There is interval developed consolidation of the posterior right lower lobe with associated small right pleural effusion. The previously noted large necrotic right hilar lymph node with mass effect on the adjacent right pulmonary artery and its branches is unchanged compared to prior exam. The left lung is clear. There is no pulmonary embolus. The visualized upper abdominal structures are unremarkable. Degenerative joint changes of the spine are identified.  Review of the MIP images confirms the above findings.  IMPRESSION: No pulmonary embolus.  Previously noted right lung mass and nodules as well as metastatic lymph nodes are unchanged.  There is new consolidation of the inferior right upper lobe probably secondary to postobstructive pneumonitis.  Posterior right lower lobe pneumonia with adjacent small right pleural effusion.  Electronically Signed   By: Abelardo Diesel M.D.   On: 08/11/2013 20:01   Dg Chest Portable 1 View  08/11/2013   CLINICAL DATA:  ATRIAL FIBRILLATION  EXAM: PORTABLE CHEST - 1 VIEW  COMPARISON:  NM PET IMAGE INITIAL (PI) SKULL BASE TO THIGH dated 08/04/2013; DG CHEST 2 VIEW dated 06/27/2013; DG CHEST 1V PORT dated 06/26/2013; CT  CHEST W/CM dated 06/21/2013  FINDINGS: There is elevation of the right hemidiaphragm but stable. Mild increased density projects in the right lung base. A right peritracheal masses again identified as well as a right hilar mass. Calcified appearing nodule projects in the right lung apex stable from prior study likely representing a calcified granuloma. Stable small calcified densities project within the left lung apex again likely reflecting granulomata. There is prominence of the interstitial markings. There is no appreciable peribronchial cuffing. No further focal regions of consolidation or focal infiltrates appreciated. The cardiac silhouette is the upper limits of normal. Atherosclerotic calcification identified within the aorta. The osseous structures are unremarkable.  IMPRESSION: Interstitial prominence may reflect sequela of pulmonary vascular congestion.  Atelectasis versus infiltrate versus possibly vascular crowding right lung base.  Stable masses in the right paratracheal and right hilar regions.   Electronically Signed   By: Margaree Mackintosh M.D.   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

## 2013-08-12 NOTE — Progress Notes (Signed)
UR Completed.  [place], [place] [phone] 08/12/2013

## 2013-08-12 NOTE — ED Provider Notes (Signed)
I saw and evaluated the patient, reviewed the resident's note and I agree with the findings and plan.      62yF with afib with RVR. On exam, NAD. irreg irreg. Tachy. Lower extremities symmetric as compared to each other. No calf tenderness. Negative Homan's. No palpable cords. Started on amio. Admit.   Virgel Manifold, MD 08/12/13 [phone]

## 2013-08-12 NOTE — Progress Notes (Signed)
Pt given PO Xanax at this time per pt request; will cont. To monitor. 

## 2013-08-12 NOTE — Progress Notes (Signed)
Dr. Terrence Dupont in room at this time; per MD let IV Amio run until bag completely infused; will cont. To monitor.

## 2013-08-12 NOTE — Progress Notes (Addendum)
INITIAL NUTRITION ASSESSMENT  DOCUMENTATION CODES Per approved criteria  -Not Applicable   INTERVENTION: Continue Ensure Complete po BID, each supplement provides 350 kcal and 13 grams of protein RD to follow for nutrition care plan  NUTRITION DIAGNOSIS: Increased nutrient needs related to catabolic illness as evidenced by estimated nutrition needs  Goal: Pt to meet >/= 90% of their estimated nutrition needs   Monitor:  PO & supplemental intake, weight, labs, I/O's  Reason for Assessment: Malnutrition Screening Tool Report  63 y.o. female  Admitting Dx: weakness  ASSESSMENT: Patient PMH of multiple medical problems i.e. 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 fib with RVR.  CT of the chest which showed no evidence of PE but showed post obstructive pneumonia. Patient complains of cough with whitish phlegm denies any fever or chills but states overall feels weak and tired. Patient states she had chemotherapy on Monday and had radiation today. Patient was started on IV amiodarone and drip with control of heart rate in one teens. But still remained in A. fib.  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  Nutrition focused physical exam completed.  No muscle or subcutaneous fat depletion noticed.  Height: Ht Readings from Last 1 Encounters:  08/11/13 5\' 3"  (1.6 m)    Weight: Wt Readings from Last 1 Encounters:  08/11/13 132 lb 15  oz (60.3 kg)    Ideal Body Weight: 115 lb  % Ideal Body Weight: 115%  Wt Readings from Last 10 Encounters:  08/11/13 132 lb 15 oz (60.3 kg)  08/09/13 129 lb 1.6 oz (58.559 kg)  08/06/13 127 lb 4.8 oz (57.743 kg)  07/30/13 127 lb (57.607 kg)  07/22/13 123 lb 6.4 oz (55.974 kg)  07/13/13 127 lb 6.8 oz (57.8 kg)  06/29/13 126 lb 8.7 oz (57.4 kg)  06/29/13 126 lb 8.7 oz (57.4 kg)    Usual Body Weight: 127 lb   % Usual Body Weight: 104%  BMI:  Body mass index is 23.55 kg/(m^2).  Estimated Nutritional Needs: Kcal: 1700-1900 Protein: 80-90 gm Fluid: 1.7-1.9 L  Skin: Intact  Diet Order: Cardiac  EDUCATION NEEDS: -No education needs identified at this time   Intake/Output Summary (Last 24 hours) at 08/12/13 1045 Last data filed at 08/12/13 0751  Gross per 24 hour  Intake    256 ml  Output   1100 ml  Net   -844 ml    Labs:   Recent Labs Lab 08/09/13 0907 08/11/13 1744  NA 134* 131*  K 3.9 4.3  CL  --  90*  CO2 27  --   BUN 10.9 8  CREATININE 0.6 0.60  CALCIUM 9.4  --   GLUCOSE 123 101*    Scheduled Meds: . amiodarone  200 mg Oral Q12H   Followed by  . [START ON 08/19/2013] amiodarone  200 mg Oral Daily  . apixaban  5 mg Oral BID  . ceFEPime (MAXIPIME) IV  1 g Intravenous 3 times per day  . feeding supplement (  ENSURE COMPLETE)  237 mL Oral BID BM  . folic acid  1 mg Oral Daily  . metoprolol tartrate  25 mg Oral BID  . multivitamin with minerals  1 tablet Oral Daily  . pantoprazole  40 mg Oral Q0600  . thiamine  100 mg Oral Daily  . vancomycin  500 mg Intravenous Q12H    Continuous Infusions: . amiodarone (NEXTERONE PREMIX) 360 mg/200 mL dextrose 30 mg/hr (08/12/13 0741)    Past Medical History  Diagnosis Date  . Depression   . Panic attacks   . Hypertension   . Smoker   . Alcohol abuse   . Cancer     Past Surgical History  Procedure Laterality Date  . Tonsillectomy    . Back surgery    . Femur im nail Right 06/22/2013    Procedure:  RIGHT HIP GAMMA NAIL FIXATION   ;  Surgeon: Renette Butters, MD;  Location: [place];  Service: Orthopedics;  Laterality: Right;  . Video bronchoscopy with endobronchial ultrasound N/A 06/22/2013    Procedure: VIDEO BRONCHOSCOPY WITH ENDOBRONCHIAL ULTRASOUND;  Surgeon: Doree Fudge, MD;  Location: [place];  Service: Pulmonary;  Laterality: N/A;    Arthur Holms, RD, LDN Pager #: [phone] After-Hours Pager #: [phone]

## 2013-08-12 NOTE — Progress Notes (Signed)
Amio gtt completely infused at this time; pt maintained SR today; will cont. To monitor.

## 2013-08-13 ENCOUNTER — Encounter: Payer: Self-pay | Admitting: Medical Oncology

## 2013-08-13 ENCOUNTER — Ambulatory Visit

## 2013-08-13 ENCOUNTER — Encounter: Payer: Self-pay | Admitting: *Deleted

## 2013-08-13 ENCOUNTER — Ambulatory Visit: Admitting: Radiation Oncology

## 2013-08-13 ENCOUNTER — Encounter: Payer: Self-pay | Admitting: Internal Medicine

## 2013-08-13 LAB — LEGIONELLA ANTIGEN, URINE: Legionella Antigen, Urine: NEGATIVE

## 2013-08-13 MED ORDER — DIGOXIN 125 MCG PO TABS
0.1250 mg | ORAL_TABLET | Freq: Every day | ORAL | Status: DC
  Administered 2013-08-13: 0.125 mg via ORAL
  Filled 2013-08-13 (×2): qty 1

## 2013-08-13 NOTE — Progress Notes (Signed)
Pt given PO Xanax this time per pt request.

## 2013-08-13 NOTE — Evaluation (Signed)
Physical Therapy Evaluation Patient Details Name: Stephanie Oliver MRN: [id number] DOB: 1950/12/06 Today's Date: 08/13/2013 Time: 1245-8099 PT Time Calculation (min): 23 min  PT Assessment / Plan / Recommendation History of Present Illness  Pt adm with PNA, a-fib. Recent L hip fx s/p gamma nail fixation 06/22/13. Recent dx NSCLC, undergoing tx.  Clinical Impression  Patient is functioning at Mod I level with mobility and gait.  No further PT indicated at this time.  May want to f/u with therapist at [facility] for any needed services.    PT Assessment  Patent does not need any further PT services (May f/u with therapist at [facility] at discharge)    Follow Up Recommendations  No PT follow up;Supervision - Intermittent    Does the patient have the potential to tolerate intense rehabilitation      Barriers to Discharge        Equipment Recommendations  None recommended by PT    Recommendations for Other Services     Frequency      Precautions / Restrictions Precautions Precautions: None Restrictions Weight Bearing Restrictions: No Other Position/Activity Restrictions: was WBAT on RLE following surgery in Jan.   Pertinent Vitals/Pain       Mobility  Bed Mobility Overal bed mobility: Modified Independent General bed mobility comments: Patient able to move supine wot sit with use of rail and no physical assist. Transfers Overall transfer level: Modified independent Equipment used: Rolling walker (2 wheeled) General transfer comment: Patient uses correct hand placement and technique. Ambulation/Gait Ambulation/Gait assistance: Modified independent (Device/Increase time) Ambulation Distance (Feet): 40 Feet Assistive device: Rolling walker (2 wheeled) Gait Pattern/deviations: Step-through pattern;Decreased stride length Gait velocity: Slow gait speed Gait velocity interpretation: Below normal speed for age/gender General Gait Details: Patient demonstrates safe use of  RW.  No assist required.        PT Goals(Current goals can be found in the care plan section)    Visit Information  Last PT Received On: 08/13/13 Assistance Needed: +1 History of Present Illness: Pt adm with PNA, a-fib. Recent L hip fx s/p gamma nail fixation 06/22/13. Recent dx NSCLC, undergoing tx.       Prior [place] expects to be discharged to:: Private residence Living Arrangements: Children Available Help at Discharge: Family;Available PRN/intermittently Type of Home: House Home Access: Stairs to enter;Ramped entrance Entrance Stairs-Number of Steps: 2  Entrance Stairs-Rails: None Home Layout: Multi-level;Bed/bath upstairs Home Equipment: Walker - 2 wheels;Bedside commode;Tub bench Additional Comments: Pt lives with dtr and infant grandson in a split foyer home. Must ascend and descend steps daily;  Prior Function Level of Independence: Independent with assistive device(s) Communication Communication: No difficulties Dominant Hand: Right    Cognition  Cognition Arousal/Alertness: Awake/alert Behavior During Therapy: WFL for tasks assessed/performed Overall Cognitive Status: Within Functional Limits for tasks assessed    Extremity/Trunk Assessment Upper Extremity Assessment Upper Extremity Assessment: Overall WFL for tasks assessed Lower Extremity Assessment Lower Extremity Assessment: Generalized weakness Cervical / Trunk Assessment Cervical / Trunk Assessment: Normal   Balance    End of Session PT - End of Session Equipment Utilized During Treatment: Gait belt Activity Tolerance: Patient limited by fatigue Patient left: in bed;with call bell/phone within reach (sitting EOB for dinner) Nurse Communication: Mobility status  GP     Despina Pole 08/13/2013, 5:16 PM Carita Pian. Sanjuana Kava, [place] Pager [phone]

## 2013-08-13 NOTE — Progress Notes (Signed)
Put [facility] forms in registration desk

## 2013-08-13 NOTE — Progress Notes (Signed)
Subjective:  Patient denies any chest pain or shortness of breath continues to have cough. Denies any palpitations. Had episode again of A. fib with RVR earlier this morning converted back into sinus rhythm  Objective:  Vital Signs in the last 24 hours: Temp:  [98.2 F (36.8 C)-99.5 F (37.5 C)] 98.2 F (36.8 C) (02/27 0538) Pulse Rate:  [93-103] 93 (02/27 0538) Resp:  [20] 20 (02/27 0538) BP: (98-120)/(65-67) 98/67 mmHg (02/27 0538) SpO2:  [96 %] 96 % (02/27 0538)  Intake/Output from previous day: 02/26 0701 - 02/27 0700 In: 1477.1 [P.O.:1200; I.V.:127.1; IV Piggyback:150] Out: 2350 [Urine:2350] Intake/Output from this shift: Total I/O In: 240 [P.O.:240] Out: -   Physical Exam: Neck: no adenopathy, no carotid bruit, no JVD and supple, symmetrical, trachea midline Lungs: Decreased breath sounds at right base with occasional rhonchi Heart: regular rate and rhythm, S1, S2 normal and Soft systolic murmur noted Abdomen: soft, non-tender; bowel sounds normal; no masses,  no organomegaly Extremities: No clubbing cyanosis 1+ edema noted  Lab Results:  Recent Labs  08/11/13 1730 08/11/13 1744  WBC 9.4  --   HGB 10.1* 11.2*  PLT 372  --     Recent Labs  08/11/13 1744  NA 131*  K 4.3  CL 90*  GLUCOSE 101*  BUN 8  CREATININE 0.60   No results found for this basename: TROPONINI, CK, MB,  in the last 72 hours Hepatic Function Panel No results found for this basename: PROT, ALBUMIN, AST, ALT, ALKPHOS, BILITOT, BILIDIR, IBILI,  in the last 72 hours No results found for this basename: CHOL,  in the last 72 hours No results found for this basename: PROTIME,  in the last 72 hours  Imaging: Imaging results have been reviewed and Ct Angio Chest Pe W/cm &/or Wo Cm  08/11/2013   CLINICAL DATA:  Known right lung mass.  Atrial fibrillation.  EXAM: CT ANGIOGRAPHY CHEST WITH CONTRAST  TECHNIQUE: Multidetector CT imaging of the chest was performed using the standard protocol during  bolus administration of intravenous contrast. Multiplanar CT image reconstructions and MIPs were obtained to evaluate the vascular anatomy.  CONTRAST:  124mL OMNIPAQUE IOHEXOL 350 MG/ML SOLN  COMPARISON:  CT chest June 21, 2013  FINDINGS: There is a heterogeneous mass measuring 7 0.1 x 5 x 6.4 cm in the immediate aspect of the right upper lobe encasing the right upper lobe pulmonary artery with mass effect on adjacent superior vena cava unchanged compared to prior exam. There are several additional nodules within the right upper lobe which are unchanged compared to prior exam. There is interval developed patchy consolidation of the inferior aspect of the right upper lobe probably postobstructive pneumonitis. There is interval developed consolidation of the posterior right lower lobe with associated small right pleural effusion. The previously noted large necrotic right hilar lymph node with mass effect on the adjacent right pulmonary artery and its branches is unchanged compared to prior exam. The left lung is clear. There is no pulmonary embolus. The visualized upper abdominal structures are unremarkable. Degenerative joint changes of the spine are identified.  Review of the MIP images confirms the above findings.  IMPRESSION: No pulmonary embolus.  Previously noted right lung mass and nodules as well as metastatic lymph nodes are unchanged.  There is new consolidation of the inferior right upper lobe probably secondary to postobstructive pneumonitis.  Posterior right lower lobe pneumonia with adjacent small right pleural effusion.   Electronically Signed   By: Mallie Darting.D.  On: 08/11/2013 20:01   Dg Chest Portable 1 View  08/11/2013   CLINICAL DATA:  ATRIAL FIBRILLATION  EXAM: PORTABLE CHEST - 1 VIEW  COMPARISON:  NM PET IMAGE INITIAL (PI) SKULL BASE TO THIGH dated 08/04/2013; DG CHEST 2 VIEW dated 06/27/2013; DG CHEST 1V PORT dated 06/26/2013; CT CHEST W/CM dated 06/21/2013  FINDINGS: There is elevation of  the right hemidiaphragm but stable. Mild increased density projects in the right lung base. A right peritracheal masses again identified as well as a right hilar mass. Calcified appearing nodule projects in the right lung apex stable from prior study likely representing a calcified granuloma. Stable small calcified densities project within the left lung apex again likely reflecting granulomata. There is prominence of the interstitial markings. There is no appreciable peribronchial cuffing. No further focal regions of consolidation or focal infiltrates appreciated. The cardiac silhouette is the upper limits of normal. Atherosclerotic calcification identified within the aorta. The osseous structures are unremarkable.  IMPRESSION: Interstitial prominence may reflect sequela of pulmonary vascular congestion.  Atelectasis versus infiltrate versus possibly vascular crowding right lung base.  Stable masses in the right paratracheal and right hilar regions.   Electronically Signed   By: Margaree Mackintosh M.D.   On: 08/11/2013 18:04    Cardiac Studies:  Assessment/Plan:  Status post Recurrent atrial fibrillation with RVR  Metastatic stage IV squamous cell CA of right lung  Possible post obstructive pneumonia right lung  Hypertension  COPD  History of tobacco abuse  History of alcohol abuse  Depression  History of panic attacks  History of hip fracture in the past  Plan As per orders Dr. Doylene Canard on-call for weekend  LOS: 2 days    Clent Demark 08/13/2013, 12:50 PM

## 2013-08-13 NOTE — Progress Notes (Signed)
Dept of [employer] form completed and given to [place].

## 2013-08-13 NOTE — Discharge Instructions (Signed)
Information on my medicine - ELIQUIS® (apixaban) °Why was Eliquis® prescribed for you? °Eliquis® was prescribed for you to reduce the risk of a blood clot forming that can cause a stroke if you have a medical condition called atrial fibrillation (a type of irregular heartbeat). ° °What do You need to know about Eliquis® ? °Take your Eliquis® TWICE DAILY - one tablet in the morning and one tablet in the evening with or without food. If you have difficulty swallowing the tablet whole please discuss with your pharmacist how to take the medication safely. ° °Take Eliquis® exactly as prescribed by your doctor and DO NOT stop taking Eliquis® without talking to the doctor who prescribed the medication.  Stopping may increase your risk of developing a stroke.  Refill your prescription before you run out. ° °After discharge, you should have regular check-up appointments with your healthcare provider that is prescribing your Eliquis®.  In the future your dose may need to be changed if your kidney function or weight changes by a significant amount or as you get older. ° °What do you do if you miss a dose? °If you miss a dose, take it as soon as you remember on the same day and resume taking twice daily.  Do not take more than one dose of ELIQUIS at the same time to make up a missed dose. ° °Important Safety Information °A possible side effect of Eliquis® is bleeding. You should call your healthcare provider right away if you experience any of the following: °  Bleeding from an injury or your nose that does not stop. °  Unusual colored urine (red or dark [place]) or unusual colored stools (red or black). °  Unusual bruising for unknown reasons. °  A serious fall or if you hit your head (even if there is no bleeding). ° °Some medicines may interact with Eliquis® and might increase your risk of bleeding or clotting while on Eliquis®. To help avoid this, consult your healthcare provider or pharmacist prior to using any new  prescription or non-prescription medications, including herbals, vitamins, non-steroidal anti-inflammatory drugs (NSAIDs) and supplements. ° °This website has more information on Eliquis® (apixaban): www.Eliquis.com. ° °

## 2013-08-13 NOTE — Progress Notes (Signed)
Discovered Stephanie Oliver was admitted to 2 [facility] - Cardiac unit on the [facility] on 08/11/13 for Atrial fibrillation and obstructive pneumonia and was placed on an Amiodarone drip. She has converted back to NSR  Presently.  She may go home today, but does not want to have any treatment today.  Dr. Tammi Klippel is aware and stated she could miss treatment today and informed Elmyra Ricks, RTT on Treatment Machine 2.

## 2013-08-13 NOTE — Progress Notes (Signed)
Occupational Therapy Evaluation Patient Details Name: Stephanie Oliver MRN: [id number] DOB: 01-18-1951 Today's Date: 08/13/2013 Time: 0347-4259 OT Time Calculation (min): 19 min  OT Assessment / Plan / Recommendation History of present illness Pt adm with PNA, a-fib. Recent L hip fx s/p gamma nail fixation 06/22/13. Recent dx NSCLC, undergoing tx.   Clinical Impression   Patient presents with no ADL deficits at this time. Will sign off.    OT Assessment  Patient does not need any further OT services    Follow Up Recommendations  No OT follow up;Supervision - Intermittent    Barriers to Discharge      Equipment Recommendations  None recommended by OT    Recommendations for Other Services    Frequency       Precautions / Restrictions Precautions Precautions: Fall   Pertinent Vitals/Pain No c/o pain    ADL  Eating/Feeding: Performed;Independent Where Assessed - Eating/Feeding: Edge of bed Grooming: Performed;Supervision/safety;Wash/dry hands;Wash/dry face;Teeth care;Brushing hair Where Assessed - Grooming: Unsupported standing Upper Body Bathing: Simulated;Independent Where Assessed - Upper Body Bathing: Unsupported sitting Lower Body Bathing: Simulated;Supervision/safety Where Assessed - Lower Body Bathing: Unsupported sit to stand Toilet Transfer: Performed;Supervision/safety Toilet Transfer Method: Sit to stand;Stand pivot Science writer: Regular height toilet;Grab bars Toileting - Clothing Manipulation and Hygiene: Performed;Independent Where Assessed - Toileting Clothing Manipulation and Hygiene: Sit to stand from 3-in-1 or toilet Equipment Used: Rolling walker Transfers/Ambulation Related to ADLs: S all mobility within room; stood at sink x 5 minutes for ADLs with S ADL Comments: S - mod I all ADLs at this time.    OT Diagnosis:    OT Problem List:   OT Treatment Interventions:     OT Goals(Current goals can be found in the care plan section)    Visit  Information  Last OT Received On: 08/13/13 Assistance Needed: +1 History of Present Illness: Pt adm with PNA, a-fib. Recent L hip fx s/p gamma nail fixation 06/22/13. Recent dx NSCLC, undergoing tx.       Prior Stephanie Oliver expects to be discharged to:: Private residence Living Arrangements: Children Available Help at Discharge: Family;Available PRN/intermittently Type of Home: House Home Access: Stairs to enter;Ramped entrance Entrance Stairs-Number of Steps: 2  Home Equipment: Independent [place] - 2 wheels;Bedside commode Prior Function Level of Independence: Independent with assistive device(s) Communication Communication: No difficulties Dominant Hand: Right         Vision/Perception Vision - History Baseline Vision: Wears glasses only for reading Patient Visual Report: No change from baseline   Cognition  Cognition Arousal/Alertness: Awake/alert Behavior During Therapy: WFL for tasks assessed/performed Overall Cognitive Status: Within Functional Limits for tasks assessed    Extremity/Trunk Assessment Upper Extremity Assessment Upper Extremity Assessment: Overall WFL for tasks assessed Lower Extremity Assessment Lower Extremity Assessment: Defer to PT evaluation     Mobility       Exercise     Balance     End of Session OT - End of Session Equipment Utilized During Treatment: Rolling walker Activity Tolerance: Patient tolerated treatment well Patient left: in bed;with call bell/phone within reach  GO     Stephanie Oliver A 08/13/2013, 11:53 AM

## 2013-08-14 ENCOUNTER — Inpatient Hospital Stay (HOSPITAL_COMMUNITY)

## 2013-08-14 MED ORDER — VANCOMYCIN HCL 500 MG IV SOLR
500.0000 mg | Freq: Two times a day (BID) | INTRAVENOUS | Status: DC
  Administered 2013-08-15: 500 mg via INTRAVENOUS
  Filled 2013-08-14 (×3): qty 500

## 2013-08-14 MED ORDER — DIGOXIN 250 MCG PO TABS
0.2500 mg | ORAL_TABLET | Freq: Every day | ORAL | Status: DC
  Administered 2013-08-14 – 2013-08-15 (×2): 0.25 mg via ORAL
  Filled 2013-08-14 (×2): qty 1

## 2013-08-14 MED ORDER — DIGOXIN 0.25 MG/ML IJ SOLN: 0.2500 mg | Freq: Once | INTRAMUSCULAR | Status: DC

## 2013-08-14 MED ORDER — DILTIAZEM HCL ER COATED BEADS 120 MG PO CP24
120.0000 mg | ORAL_CAPSULE | Freq: Every day | ORAL | Status: DC
  Administered 2013-08-14: 120 mg via ORAL
  Filled 2013-08-14 (×3): qty 1

## 2013-08-14 NOTE — Progress Notes (Signed)
Patient went into afib rhythm with rate 120 to 140s, patient was resting, asymptomatic and stated she had similar event previous night. Event occurred around 11:30 pm 2/27. Patient had already received her scheduled 200 mg Amio and 12.5 mg metoprolol. Patient converted to sinus at 1:46 am.  Will continue to monitor.

## 2013-08-14 NOTE — Progress Notes (Signed)
Subjective:  Positive cough. Feels tired. Afebrile. Back in sinus rhythm with diltiazem use.  Objective:  Vital Signs in the last 24 hours: Temp:  [98.3 F (36.8 C)-98.5 F (36.9 C)] 98.3 F (36.8 C) (02/28 1300) Pulse Rate:  [93-103] 93 (02/28 1300) Cardiac Rhythm:  [-] Normal sinus rhythm (02/28 0909) Resp:  [18] 18 (02/28 1300) BP: (106-124)/(65-73) 119/73 mmHg (02/28 1300) SpO2:  [96 %-100 %] 100 % (02/28 1300)  Physical Exam: BP Readings from Last 1 Encounters:  08/14/13 119/73     Wt Readings from Last 1 Encounters:  08/11/13 60.3 kg (132 lb 15 oz)    Weight change:   HEENT: [place]/AT, Eyes-hazel, PERL, EOMI, Conjunctiva-Pink, Sclera-Non-icteric Neck: No JVD, No bruit, Trachea midline. Lungs:  Clearing, Bilateral. Cardiac:  Regular rhythm, normal S1 and S2, no S3.  Abdomen:  Soft, non-tender. Extremities:  Trace edema present. No cyanosis. No clubbing. CNS: AxOx3, Cranial nerves grossly intact, moves all 4 extremities. Right handed. Skin: Warm and dry.   Intake/Output from previous day: 02/27 0701 - 02/28 0700 In: 600 [P.O.:600] Out: 200 [Urine:200]    Lab Results: BMET    Component Value Date/Time   NA 131* 08/11/2013 1744   NA 134* 08/09/2013 0907   K 4.3 08/11/2013 1744   K 3.9 08/09/2013 0907   CL 90* 08/11/2013 1744   CO2 27 08/09/2013 0907   CO2 32 06/30/2013 1125   GLUCOSE 101* 08/11/2013 1744   GLUCOSE 123 08/09/2013 0907   BUN 8 08/11/2013 1744   BUN 10.9 08/09/2013 0907   CREATININE 0.60 08/11/2013 1744   CREATININE 0.6 08/09/2013 0907   CALCIUM 9.4 08/09/2013 0907   CALCIUM 8.8 06/30/2013 1125   GFRNONAA >90 06/30/2013 1125   GFRAA >90 06/30/2013 1125   CBC    Component Value Date/Time   WBC 9.4 08/11/2013 1730   WBC 11.0* 08/09/2013 0906   RBC 3.17* 08/11/2013 1730   RBC 3.48* 08/09/2013 0906   HGB 11.2* 08/11/2013 1744   HGB 10.7* 08/09/2013 0906   HCT 33.0* 08/11/2013 1744   HCT 32.7* 08/09/2013 0906   PLT 372 08/11/2013 1730   PLT 399 08/09/2013 0906   MCV 93.4 08/11/2013 1730   MCV 94.0 08/09/2013 0906   MCH 31.9 08/11/2013 1730   MCH 30.7 08/09/2013 0906   MCHC 34.1 08/11/2013 1730   MCHC 32.7 08/09/2013 0906   RDW 14.1 08/11/2013 1730   RDW 14.1 08/09/2013 0906   LYMPHSABS 0.5* 08/11/2013 1730   LYMPHSABS 0.7* 08/09/2013 0906   MONOABS 0.7 08/11/2013 1730   MONOABS 1.0* 08/09/2013 0906   EOSABS 0.1 08/11/2013 1730   EOSABS 0.1 08/09/2013 0906   BASOSABS 0.0 08/11/2013 1730   BASOSABS 0.0 08/09/2013 0906   CARDIAC ENZYMES Lab Results  Component Value Date   TROPONINI <0.30 06/24/2013    Scheduled Meds: . amiodarone  200 mg Oral Q12H   Followed by  . [START ON 08/19/2013] amiodarone  200 mg Oral Daily  . apixaban  5 mg Oral BID  . ceFEPime (MAXIPIME) IV  1 g Intravenous 3 times per day  . digoxin  0.25 mg Oral Daily  . diltiazem  120 mg Oral Daily  . feeding supplement (ENSURE COMPLETE)  237 mL Oral BID BM  . folic acid  1 mg Oral Daily  . metoprolol tartrate  25 mg Oral BID  . multivitamin with minerals  1 tablet Oral Daily  . pantoprazole  40 mg Oral Q0600  . thiamine  100 mg  Oral Daily  . 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 Continue vancomycin.   LOS: 3 days    Dixie Dials  MD  08/14/2013, 4:40 PM

## 2013-08-14 NOTE — Progress Notes (Signed)
Patient converted by to Afib with rate at low 100s to 112. Patient converted to afib while being transported to xray, asymptomatic. Will continue to monitor.

## 2013-08-15 MED ORDER — LEVOFLOXACIN 500 MG PO TABS: 500.0000 mg | ORAL_TABLET | Freq: Every day | ORAL | Status: DC

## 2013-08-15 MED ORDER — AMIODARONE HCL 200 MG PO TABS: 200.0000 mg | ORAL_TABLET | Freq: Every day | ORAL | Status: AC

## 2013-08-15 MED ORDER — DIGOXIN 125 MCG PO TABS: 0.1250 mg | ORAL_TABLET | Freq: Every day | ORAL | Status: AC

## 2013-08-15 MED ORDER — FUROSEMIDE 40 MG PO TABS: 20.0000 mg | ORAL_TABLET | Freq: Every day | ORAL | Status: DC

## 2013-08-15 MED ORDER — PANTOPRAZOLE SODIUM 40 MG PO TBEC: 40.0000 mg | DELAYED_RELEASE_TABLET | Freq: Every day | ORAL | Status: DC

## 2013-08-15 MED ORDER — AMIODARONE HCL 200 MG PO TABS: 200.0000 mg | ORAL_TABLET | Freq: Two times a day (BID) | ORAL | Status: AC

## 2013-08-15 MED ORDER — POTASSIUM CHLORIDE CRYS ER 20 MEQ PO TBCR: 20.0000 meq | EXTENDED_RELEASE_TABLET | Freq: Every day | ORAL | Status: DC

## 2013-08-15 MED ORDER — DILTIAZEM HCL ER COATED BEADS 120 MG PO CP24: 120.0000 mg | ORAL_CAPSULE | Freq: Every day | ORAL | Status: DC

## 2013-08-15 NOTE — Discharge Summary (Signed)
, Physician Discharge Summary  Patient ID: Stephanie Oliver MRN: [id number] DOB/AGE: 1951-02-05 63 y.o.  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 stage IV probable squamous cell see of right lung, history of recurrent atrial fibrillation with RVR, hypertension, tobacco abuse, history of alcohol abuse, history of depression, was admitted for A. fib with RVR. She also had weakness and post obstructive pneumonia. She responded well to amiodarone, low dose lanoxin and diltiazem and remained in sinus rhythm for 24 hours. She was also treated with IV antibiotics followed by oral antibioticand remained afebrile without leukocytosis.   Consults: None  Significant Diagnostic Studies: labs: Normal WBC and platelets count. Low Hgb of 10.1. Urine was negative for strep pneumonia and Legionella antigen. HIV test was non reactive. Electrolytes and liver function test are near normal except mild hyponatremia and low albumin of 2.7 g/dL.  Treatments: antibiotics: Vancomycin and Cefepime and cardiac meds: metoprolol, diltiazem, digoxin and amiodarone  Discharge  Exam: Blood pressure 147/70, pulse 89, temperature 98.6 F (37 C), temperature source Oral, resp. rate 18, height 5\' 3"  (1.6 m), weight 60.3 kg (132 lb 15 oz), SpO2 97.00%. HEENT: [place]/AT, Eyes-hazel, PERL, EOMI, Conjunctiva-Pink, Sclera-Non-icteric  Neck: No JVD, No bruit, Trachea midline.  Lungs: Clearing, Bilateral.  Cardiac: Regular rhythm, normal S1 and S2, no S3.  Abdomen: Soft, non-tender.  Extremities: Trace edema present. No cyanosis. No clubbing.  CNS: AxOx3, Cranial nerves grossly intact, moves all 4 extremities. Right handed.  Skin: Warm and dry.   Disposition: 01-Home or Self Care   Future Appointments Provider Department Dept Phone   08/16/2013 8:15 AM [facility] [phone]   08/16/2013 8:45 AM [facility] [phone]   08/16/2013 9:45 AM [facility] [phone]   08/16/2013 10:15 AM [facility] [phone]   08/16/2013 11:15 AM Karie [facility], [facility] [phone]   08/17/2013 8:15 AM [facility] [phone]   08/18/2013 8:15 AM [facility] [phone]   08/19/2013 8:15 AM [facility] [phone]   08/23/2013 9:45 AM [facility] [phone]   08/23/2013 10:15 AM [facility] [phone]   08/23/2013 12:15 PM [facility] [phone]   08/23/2013 2:15 PM [facility] [phone]   08/24/2013 9:10 AM [facility] [phone]   08/30/2013  9:45 AM [facility] [phone]   08/30/2013 10:15 AM [facility] [phone]   09/06/2013 9:45 AM [facility] [phone]   09/06/2013 10:15 AM [facility] [phone]   09/13/2013 9:45 AM [facility] [phone]   09/13/2013 10:15 AM [facility] [phone]   09/20/2013 9:45 AM [facility] [phone]   09/20/2013 10:15 AM [facility] [phone]       Medication List    STOP taking these medications       albuterol 108 (90 BASE) MCG/ACT inhaler  Commonly known as:  PROVENTIL HFA;VENTOLIN HFA     metoprolol succinate 100 MG 24 hr tablet  Commonly known as:  TOPROL-XL     sertraline 100 MG tablet  Commonly known as:  ZOLOFT      TAKE these medications       ALPRAZolam 0.5 MG tablet  Commonly known as:  XANAX  Take 1 tablet (0.5 mg total) by mouth 2 (two) times daily as needed for anxiety.     amiodarone 200 MG tablet  Commonly known as:  PACERONE  Take 1 tablet (200 mg total) by mouth 2 (two) times daily.     amiodarone 200 MG tablet  Commonly known as:  PACERONE  Take 1 tablet (200 mg total) by mouth daily.  Start taking on:  08/20/2013     apixaban 5 MG Tabs tablet  Commonly known as:  ELIQUIS  Take 1 tablet (5 mg total) by mouth 2 (two) times daily.     diclofenac sodium 1 % Gel  Commonly known as:  VOLTAREN  Apply 4 g topically 4 (four) times daily as needed (for pain).     digoxin 0.125 MG tablet  Commonly known as:  LANOXIN  Take 1 tablet (0.125 mg total) by mouth daily.     diltiazem 120 MG 24 hr capsule  Commonly known as:  CARDIZEM CD  Take 1 capsule (120 mg total) by mouth  daily.     diphenoxylate-atropine 2.5-0.025 MG per tablet  Commonly known as:  LOMOTIL  Take 1 tablet by mouth 4 (four) times daily as needed for diarrhea or loose stools.     docusate sodium 100 MG capsule  Commonly known as:  COLACE  Take 1 capsule (100 mg total) by mouth 2 (two) times daily. Continue this while taking narcotics to help with bowel movements     feeding supplement (ENSURE COMPLETE) Liqd  Take 237 mLs by mouth 2 (two) times daily between meals.     folic acid 1 MG tablet  Commonly known as:  FOLVITE  Take 1 tablet (1 mg total) by mouth daily.     furosemide 40 MG tablet  Commonly known as:  LASIX  Take 0.5 tablets (20 mg total) by mouth daily.     HYDROcodone-acetaminophen 5-325 MG per tablet  Commonly known as:  NORCO/VICODIN  Take 1 tablet by mouth every 6 (six) hours as needed for moderate pain.     iron polysaccharides 150 MG capsule  Commonly known as:  NIFEREX  Take 1 capsule (150 mg total) by mouth daily.     levalbuterol 0.63 MG/3ML nebulizer solution  Commonly known as:  XOPENEX  Take 0.63 mg by  nebulization every 4 (four) hours as needed for wheezing or shortness of breath.     levofloxacin 500 MG tablet  Commonly known as:  LEVAQUIN  Take 1 tablet (500 mg total) by mouth daily.     methocarbamol 500 MG tablet  Commonly known as:  ROBAXIN  Take 1 tablet (500 mg total) by mouth every 6 (six) hours as needed for muscle spasms.     metoprolol tartrate 25 MG tablet  Commonly known as:  LOPRESSOR  Take 25 mg by mouth 2 (two) times daily.     multivitamin with minerals Tabs tablet  Take 1 tablet by mouth daily.     pantoprazole 40 MG tablet  Commonly known as:  PROTONIX  Take 1 tablet (40 mg total) by mouth daily.     potassium chloride SA 20 MEQ tablet  Commonly known as:  K-DUR,KLOR-CON  Take 1 tablet (20 mEq total) by mouth daily.     thiamine 100 MG tablet  Take 1 tablet (100 mg total) by mouth daily.           Follow-up  Information   Follow up with [name], MD In 2 weeks.   Specialty:  Family Medicine   Contact information:   [address], [place] [address] [phone]       Follow up with Clent Demark, MD In 1 month.   Specialty:  Cardiology   Contact information:   [facility] [address] [place] [address] [phone]       Signed: Birdie Riddle 08/15/2013, 8:01 AM

## 2013-08-16 ENCOUNTER — Ambulatory Visit
Admission: RE | Admit: 2013-08-16 | Discharge: 2013-08-16 | Disposition: A | Discharge status: Home / Self Care | Source: Ambulatory Visit | Attending: Radiation Oncology | Admitting: Radiation Oncology

## 2013-08-16 ENCOUNTER — Other Ambulatory Visit (HOSPITAL_BASED_OUTPATIENT_CLINIC_OR_DEPARTMENT_OTHER)

## 2013-08-16 ENCOUNTER — Ambulatory Visit: Payer: No Typology Code available for payment source | Admitting: Internal Medicine

## 2013-08-16 ENCOUNTER — Encounter: Payer: Self-pay | Admitting: *Deleted

## 2013-08-16 ENCOUNTER — Ambulatory Visit (HOSPITAL_BASED_OUTPATIENT_CLINIC_OR_DEPARTMENT_OTHER): Admitting: Physician Assistant

## 2013-08-16 ENCOUNTER — Telehealth: Payer: Self-pay | Admitting: Internal Medicine

## 2013-08-16 ENCOUNTER — Encounter: Payer: No Typology Code available for payment source | Admitting: Nutrition

## 2013-08-16 ENCOUNTER — Ambulatory Visit

## 2013-08-16 ENCOUNTER — Encounter: Payer: Self-pay | Admitting: Physician Assistant

## 2013-08-16 ENCOUNTER — Ambulatory Visit: Payer: No Typology Code available for payment source

## 2013-08-16 VITALS — BP 110/60 | HR 96 | Temp 98.1°F | Resp 18 | Ht 63.0 in | Wt 134.5 lb

## 2013-08-16 DIAGNOSIS — R05 Cough: Secondary | ICD-10-CM

## 2013-08-16 DIAGNOSIS — M79609 Pain in unspecified limb: Secondary | ICD-10-CM | POA: Diagnosis not present

## 2013-08-16 DIAGNOSIS — Y842 Radiological procedure and radiotherapy as the cause of abnormal reaction of the patient, or of later complication, without mention of misadventure at the time of the procedure: Secondary | ICD-10-CM | POA: Diagnosis not present

## 2013-08-16 DIAGNOSIS — R5383 Other fatigue: Secondary | ICD-10-CM

## 2013-08-16 DIAGNOSIS — C7951 Secondary malignant neoplasm of bone: Secondary | ICD-10-CM

## 2013-08-16 DIAGNOSIS — R0989 Other specified symptoms and signs involving the circulatory and respiratory systems: Secondary | ICD-10-CM

## 2013-08-16 DIAGNOSIS — I4891 Unspecified atrial fibrillation: Secondary | ICD-10-CM

## 2013-08-16 DIAGNOSIS — R059 Cough, unspecified: Secondary | ICD-10-CM

## 2013-08-16 DIAGNOSIS — C801 Malignant (primary) neoplasm, unspecified: Secondary | ICD-10-CM | POA: Diagnosis not present

## 2013-08-16 DIAGNOSIS — L589 Radiodermatitis, unspecified: Secondary | ICD-10-CM | POA: Diagnosis not present

## 2013-08-16 DIAGNOSIS — C787 Secondary malignant neoplasm of liver and intrahepatic bile duct: Secondary | ICD-10-CM

## 2013-08-16 DIAGNOSIS — Z79899 Other long term (current) drug therapy: Secondary | ICD-10-CM | POA: Diagnosis not present

## 2013-08-16 DIAGNOSIS — C3410 Malignant neoplasm of upper lobe, unspecified bronchus or lung: Secondary | ICD-10-CM

## 2013-08-16 DIAGNOSIS — R0609 Other forms of dyspnea: Secondary | ICD-10-CM

## 2013-08-16 DIAGNOSIS — C3490 Malignant neoplasm of unspecified part of unspecified bronchus or lung: Secondary | ICD-10-CM | POA: Diagnosis not present

## 2013-08-16 DIAGNOSIS — C7952 Secondary malignant neoplasm of bone marrow: Secondary | ICD-10-CM

## 2013-08-16 DIAGNOSIS — C3491 Malignant neoplasm of unspecified part of right bronchus or lung: Secondary | ICD-10-CM

## 2013-08-16 DIAGNOSIS — R5381 Other malaise: Secondary | ICD-10-CM

## 2013-08-16 DIAGNOSIS — Z510 Encounter for antineoplastic radiation therapy: Secondary | ICD-10-CM | POA: Diagnosis not present

## 2013-08-16 LAB — CBC WITH DIFFERENTIAL/PLATELET
BASO%: 0.6 % (ref 0.0–2.0)
Basophils Absolute: 0 10*3/uL (ref 0.0–0.1)
EOS%: 0.6 % (ref 0.0–7.0)
Eosinophils Absolute: 0 10*3/uL (ref 0.0–0.5)
HCT: 29.5 % — ABNORMAL LOW (ref 34.8–46.6)
HEMOGLOBIN: 9.9 g/dL — AB (ref 11.6–15.9)
LYMPH#: 0.6 10*3/uL — AB (ref 0.9–3.3)
LYMPH%: 11.8 % — AB (ref 14.0–49.7)
MCH: 30.8 pg (ref 25.1–34.0)
MCHC: 33.6 g/dL (ref 31.5–36.0)
MCV: 91.9 fL (ref 79.5–101.0)
MONO#: 1 10*3/uL — ABNORMAL HIGH (ref 0.1–0.9)
MONO%: 21.3 % — ABNORMAL HIGH (ref 0.0–14.0)
NEUT#: 3.1 10*3/uL (ref 1.5–6.5)
NEUT%: 65.7 % (ref 38.4–76.8)
PLATELETS: 337 10*3/uL (ref 145–400)
RBC: 3.21 10*6/uL — ABNORMAL LOW (ref 3.70–5.45)
RDW: 14.6 % — ABNORMAL HIGH (ref 11.2–14.5)
WBC: 4.8 10*3/uL (ref 3.9–10.3)
nRBC: 0 % (ref 0–0)

## 2013-08-16 LAB — COMPREHENSIVE METABOLIC PANEL (CC13)
ALBUMIN: 2.7 g/dL — AB (ref 3.5–5.0)
ALK PHOS: 110 U/L (ref 40–150)
ALT: 14 U/L (ref 0–55)
AST: 15 U/L (ref 5–34)
Anion Gap: 9 mEq/L (ref 3–11)
BUN: 8.3 mg/dL (ref 7.0–26.0)
CALCIUM: 9.6 mg/dL (ref 8.4–10.4)
CHLORIDE: 97 meq/L — AB (ref 98–109)
CO2: 29 mEq/L (ref 22–29)
Creatinine: 0.5 mg/dL — ABNORMAL LOW (ref 0.6–1.1)
GLUCOSE: 87 mg/dL (ref 70–140)
POTASSIUM: 3.8 meq/L (ref 3.5–5.1)
Sodium: 135 mEq/L — ABNORMAL LOW (ref 136–145)
Total Bilirubin: 0.34 mg/dL (ref 0.20–1.20)
Total Protein: 6.8 g/dL (ref 6.4–8.3)

## 2013-08-16 NOTE — Patient Instructions (Signed)
We will now proceed with her weekly chemotherapy today centered just been released from the hospital Continue with her radiation therapy as scheduled Resume your weekly chemotherapy concurrent with the radiation therapy next week Followup with Dr. Julien Nordmann in 2 weeks for a symptom management visit

## 2013-08-16 NOTE — Progress Notes (Signed)
Patient was brought to nursing,  Recently d/c from hospital for A-fib, was on Amiodarone  gtt,  Weaned to oral, converted back to NSR ,  also obstructive pneumonia,then , on leavaquin,  Patient female family member stated they were late due to the fog this am, patient was eating a sub sandwich and drinking water without dificulty, but dropped water bottle , coughs up clear phelgm stated, called [facility] after checking appt , was due to see her at 9:45am, and they haven;t even dione labs as yet, questions were directed towards chemotherapy so ,I  sent them to get labs and mediocaal oncology , A.Jonson will fit them in as she can, there wasn't a need to see Dr.moody today, they will see him Fruiday after treatment, took vitals and oxygen sats,patient wasn't weighed .mnow

## 2013-08-16 NOTE — Progress Notes (Addendum)
No images are attached to the encounter. No scans are attached to the encounter. No scans are attached to the encounter. 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   Summary: Stage IV (T3, N2, M1b)  PRIOR THERAPY: None  CURRENT THERAPY: Concurrent chemoradiation with chemotherapy the form of weekly carboplatin for AUC of 2 and paclitaxel at 45 mg per meter squared, status post 2 weekly cycles  DISEASE STAGE: Non-small cell cancer of right lung   Primary site: Lung (Right)   Staging method: AJCC 7th Edition   Clinical: Stage IV (T3, N2, 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   INTERVAL HISTORY: Misbah Hornaday 63 y.o. 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 She is currently undergoing a course of concurrent chemoradiation. She's completed 2 weeks of therapy. She is accompanied by her caregiver today. She reports that she was discharged from [facility] yesterday 08/15/2013 where she was hospitalized for atrial fibrillation with rapid ventricular response as well as pneumonia. She was discharged on amiodarone as well as a course of Levaquin. She's a  followup appointment with the cardiologist on 08/17/2013 and is to see her primary care physician Dr. Tamala Julian on 08/18/2013. She did have her radiation treatment today. She does report some fatigue. She is now scheduled for radiation therapy through 08/24/2013. She continues to have some pain in the right hip and is status post stabilization surgery after suffering a right hip fracture. She reports some fatigue as well as cough and shortness of breath with exertion but denied any other specific complaints.  MEDICAL HISTORY: Past Medical History  Diagnosis Date  . Depression   . Panic attacks   . Hypertension   . Smoker   . Alcohol abuse   . Cancer     ALLERGIES:  is allergic to paxil; lisinopril; and penicillins.  MEDICATIONS:  Current Outpatient Prescriptions  Medication Sig Dispense Refill  . ALPRAZolam (XANAX) 0.5 MG tablet Take 1 tablet (0.5 mg total) by mouth 2 (two) times daily as needed for anxiety.  30 tablet  0  . amiodarone (PACERONE) 200 MG tablet Take 1 tablet (200 mg total) by mouth 2 (two) times daily.      Marland Kitchen apixaban (ELIQUIS) 5 MG TABS tablet Take 1 tablet (5 mg total) by mouth 2 (two) times daily.  60 tablet  1  . diclofenac sodium (VOLTAREN) 1 % GEL Apply 4 g topically 4 (four) times daily as needed (for pain).      Marland Kitchen digoxin (LANOXIN) 0.125 MG tablet Take 1 tablet (0.125 mg total) by mouth daily.  30 tablet  1  . diltiazem (CARDIZEM CD) 120 MG 24 hr capsule Take 1 capsule (120 mg total) by mouth daily.  [place]  capsule  1  . diphenoxylate-atropine (LOMOTIL) 2.5-0.025 MG per tablet Take 1 tablet by mouth 4 (four) times daily as needed for diarrhea or loose stools.      . docusate sodium (COLACE) 100 MG capsule Take 1 capsule (100 mg total) by mouth 2 (two) times daily. Continue this while taking narcotics to help with bowel movements  30 capsule  1  . feeding supplement, ENSURE COMPLETE, (ENSURE COMPLETE) LIQD Take 237 mLs by mouth 2 (two) times daily between meals.  60 Bottle  6   . folic acid (FOLVITE) 1 MG tablet Take 1 tablet (1 mg total) by mouth daily.  30 tablet  1  . furosemide (LASIX) 40 MG tablet Take 0.5 tablets (20 mg total) by mouth daily.  30 tablet  1  . HYDROcodone-acetaminophen (NORCO/VICODIN) 5-325 MG per tablet Take 1 tablet by mouth every 6 (six) hours as needed for moderate pain.      . iron polysaccharides (NIFEREX) 150 MG capsule Take 1 capsule (150 mg total) by mouth daily.  30 capsule  1  . levalbuterol (XOPENEX) 0.63 MG/3ML nebulizer solution Take 0.63 mg by nebulization every 4 (four) hours as needed for wheezing or shortness of breath.      . levofloxacin (LEVAQUIN) 500 MG tablet Take 1 tablet (500 mg total) by mouth daily.  5 tablet  0  . Multiple Vitamin (MULTIVITAMIN WITH MINERALS) TABS tablet Take 1 tablet by mouth daily.      . pantoprazole (PROTONIX) 40 MG tablet Take 1 tablet (40 mg total) by mouth daily.  30 tablet  1  . potassium chloride SA (K-DUR,KLOR-CON) 20 MEQ tablet Take 1 tablet (20 mEq total) by mouth daily.  60 tablet  1  . prochlorperazine (COMPAZINE) 10 MG tablet       . thiamine 100 MG tablet Take 1 tablet (100 mg total) by mouth daily.  30 tablet  1  . [START ON 08/20/2013] amiodarone (PACERONE) 200 MG tablet Take 1 tablet (200 mg total) by mouth daily.  30 tablet  1  . methocarbamol (ROBAXIN) 500 MG tablet Take 1 tablet (500 mg total) by mouth every 6 (six) hours as needed for muscle spasms.  30 tablet  0  . metoprolol tartrate (LOPRESSOR) 25 MG tablet Take 25 mg by mouth 2 (two) times daily.       No current facility-administered medications for this visit.    SURGICAL HISTORY:  Past Surgical History  Procedure Laterality Date  . Tonsillectomy    . Back surgery    . Femur im nail Right 06/22/2013    Procedure: RIGHT HIP GAMMA NAIL FIXATION   ;  Surgeon: Renette Butters, MD;  Location: [facility];  Service: Orthopedics;  Laterality: Right;  . Video bronchoscopy with endobronchial ultrasound N/A 06/22/2013    Procedure: VIDEO  BRONCHOSCOPY WITH ENDOBRONCHIAL ULTRASOUND;  Surgeon: Doree Fudge, MD;  Location: [facility];  Service: Pulmonary;  Laterality: N/A;    REVIEW OF SYSTEMS:  Constitutional: positive for fatigue Eyes: negative Ears, nose, mouth, throat, and face: negative Respiratory: positive for cough and dyspnea on exertion Cardiovascular: negative Gastrointestinal: negative Genitourinary:negative Integument/breast: negative Hematologic/lymphatic: negative Musculoskeletal:positive for bone pain Neurological: negative Behavioral/Psych: positive for anxiety Endocrine: negative Allergic/Immunologic: negative   PHYSICAL EXAMINATION: General appearance: alert, cooperative, appears stated age and no distress Head: Normocephalic, without obvious abnormality, atraumatic Neck: no adenopathy, no carotid bruit, no JVD, supple, symmetrical, trachea midline and thyroid not enlarged, symmetric, no tenderness/mass/nodules Lymph nodes: Cervical, supraclavicular, and axillary  nodes normal. Resp: clear to auscultation bilaterally Cardio: regular rate and rhythm, S1, S2 normal, no murmur, click, rub or gallop GI: soft, non-tender; bowel sounds normal; no masses,  no organomegaly Extremities: Patient continues to have pain related to her right hip stabilization surgery on 06/22/2013 as well as some right lower extremity edema Neurologic: Grossly normal  ECOG PERFORMANCE STATUS: 1 - Symptomatic but completely ambulatory  Blood pressure 110/60, pulse 96, temperature 98.1 F (36.7 C), temperature source Oral, resp. rate 18, height 5\' 3"  (1.6 m), weight 134 lb 8 oz (61.009 kg), SpO2 98.00%.  LABORATORY DATA: Lab Results  Component Value Date   WBC 4.8 08/16/2013   HGB 9.9* 08/16/2013   HCT 29.5* 08/16/2013   MCV 91.9 08/16/2013   PLT 337 08/16/2013      Chemistry      Component Value Date/Time   NA 135* 08/16/2013 1149   NA 131* 08/11/2013 1744   K 3.8 08/16/2013 1149   K 4.3 08/11/2013 1744   CL 90* 08/11/2013  1744   CO2 29 08/16/2013 1149   CO2 32 06/30/2013 1125   BUN 8.3 08/16/2013 1149   BUN 8 08/11/2013 1744   CREATININE 0.5* 08/16/2013 1149   CREATININE 0.60 08/11/2013 1744      Component Value Date/Time   CALCIUM 9.6 08/16/2013 1149   CALCIUM 8.8 06/30/2013 1125   ALKPHOS 110 08/16/2013 1149   ALKPHOS 159* 06/30/2013 1125   AST 15 08/16/2013 1149   AST 31 06/30/2013 1125   ALT 14 08/16/2013 1149   ALT 43* 06/30/2013 1125   BILITOT 0.34 08/16/2013 1149   BILITOT 1.1 06/30/2013 1125       RADIOGRAPHIC STUDIES:  Dg Knee 1-2 Views Right  07/13/2013   CLINICAL DATA:  Knee pain.  EXAM: RIGHT KNEE - 1-2 VIEW  COMPARISON:  None.  FINDINGS: Intra medullary rod and screw noted. No acute bony abnormality. Degenerative changes noted about the right knee. Vascular calcification noted.  IMPRESSION: No acute abnormality. Intra medullary rod and screw node in the right femur. This appears well seated.   Electronically Signed   By: Marcello Moores  Register   On: 07/13/2013 09:18   Nm Pet Image Initial (pi) Skull Base To Thigh  08/04/2013   CLINICAL DATA:  Initial treatment strategy for non-small-cell lung cancer.  EXAM: NUCLEAR MEDICINE PET SKULL BASE TO THIGH  FASTING BLOOD GLUCOSE:  Value: 91 mg/dl  TECHNIQUE: 7.5 mCi F-18 FDG was injected intravenously. Full-ring PET imaging was performed from the skull base to thigh after the radiotracer. CT data was obtained and used for attenuation correction and anatomic localization.  COMPARISON:  Chest CT 06/21/2013.  FINDINGS: NECK  No hypermetabolic lymph nodes in the neck. However, there is a focus of hypermetabolism which localizes to right internal jugular vein (image 44 of series 4 and image 45 of series 605), which is of uncertain etiology and significance.  CHEST  Large mass in the medial aspect of the right upper lobe measures 7.4 x 5.8 cm and demonstrates peripheral hypermetabolism (SUVmax = 12.2)with central hypometabolism, likely to reflect central necrosis. This mass is highly  aggressive in appearance with probable mediastinal invasion along the medial margin where it extends to make contact with the right internal jugular vein and superior vena cava (superior vena cava is markedly narrowed and slit-like in appearance immediately above the superior cavoatrial junction). There is also right hilar lymphadenopathy with a large right hilar nodal mass that measures approximately 5.4 x 5.7 cm and  also demonstrates peripheral hypermetabolism (SUVmax = 9.4). There is also a hypermetabolic lower right paratracheal lymph node that measures at least 1.6 cm in short axis (SUVmax = 5.8). Extensive postobstructive changes are noted in the right upper and middle lobe, compatible with postobstructive pneumonitis. Similar findings are present to a lesser degree in the dependent portion of the right lower lobe, and there is a small right-sided pleural effusion layering dependently. No definite pleural based hypermetabolism is noted at this time. There is atherosclerosis of the thoracic aorta, the great vessels of the mediastinum and the coronary arteries, including calcified atherosclerotic plaque in the left main, left anterior descending, left circumflex and right coronary arteries.  ABDOMEN/PELVIS  There are 2 hypermetabolic liver lesions, largest of which is in the inferior aspect of segment 3 measuring 4.2 x 2.8 cm (SUVmax = 10.2), while the smaller lesion is in the central aspect of segment 8 of the liver measuring 1.9 x 1.9 cm (SUVmax = 12.2). Several tiny calcifications within the spleen are presumably calcified granulomas. The appearance of the gallbladder, pancreas, bilateral adrenal glands and right kidney is unremarkable. In the posterior aspect of the interpolar region of the left kidney there is a 7 mm high attenuation lesion which is not hypermetabolic, favored to represent a proteinaceous or hemorrhagic cyst. Atherosclerotic calcifications are noted throughout the abdominal and pelvic  vasculature, without evidence of aneurysm. No definite hypermetabolic lymphadenopathy identified within the abdomen or pelvis.  SKELETON  Ill-defined area of sclerosis in the right side of the sacrum is diffusely hypermetabolic (SUVmax = 27.2). In addition, there is a 6 mm sclerotic focus in the anterior aspect of the left ilium (image 150 of series 4) that is hypermetabolic (SUVmax = 3.4). Postoperative changes of ORIF in the right hip are noted. Old healed fracture of the posterolateral aspect of the left eighth rib.  IMPRESSION: 1. Findings are compatible with stage IV lung cancer with a 7.4 x 5.8 cm right upper lobe mass that demonstrates direct mediastinal invasion, right hilar and low right paratracheal lymphadenopathy, in addition to multiple liver and bone metastases, as detailed above. 2. Postobstructive changes in the right lung, predominately in the right upper and middle lobes, with a small right-sided pleural effusion which is simple in appearance at this time. 3. Atherosclerosis, including left main and 3 vessel coronary artery disease. 4. Additional incidental findings, as above.   Electronically Signed   By: Vinnie Langton M.D.   On: 08/04/2013 14:33     ASSESSMENT/PLAN: Patient is a pleasant 63 year old Caucasian female initially thought to have stage IIIA disease, however after review of the PET scan with multiple liver lesions as well as bone metastasis, the patient has stage IV disease. Patient was discussed with as well as seen by Dr. Julien Nordmann. As she is recently been discharged from the hospital for the atrial fibrillation and pneumonia we will give her a week to recover and not proceed with weekly chemotherapy today. She'll continue with radiation therapy as scheduled. She'll followup with Dr. Julien Nordmann in 2 weeks for a symptom management visit after completing her course of concurrent chemoradiation.   Wynetta Emery, Winnona Wargo E, PA-C  All questions were answered. The patient knows to call the  clinic with any problems, questions or concerns. We can certainly see the patient much sooner if necessary.  ADDENDUM: Hematology/Oncology Attending: I had a face to face encounter with the patient today. I recommended her care plan. This is a very pleasant 63 years old white female with recently  diagnosed with stage IV non-small cell lung cancer with recent PET scan showing liver and bone metastasis. She has a large right upper lobe lung mass with direct mediastinal invasion. The patient is currently undergoing a course of concurrent chemoradiation to the right upper lobe lung mass and mediastinum. She is status post 2 cycles. She was recently admitted to [facility] with atrial fibrillation with rapid ventricular rate and was started on treatment with amiodarone. She was discharged from the hospital yesterday. She is here today for evaluation before starting this seventh cycle of her treatment. I recommended for the patient to hold his current treatment with chemotherapy today to give her more time to recover from the recent admission and management of her atrial fibrillation.  We will resume her systemic chemotherapy next week.  After this short course of concurrent chemoradiation, I would consider the patient for systemic chemotherapy with carboplatin and paclitaxel on every 3 weeks as scheduled. She would come back for followup visit in 2 weeks for reevaluation. She was advised to call immediately if she has any concerning symptoms in the interval.  Disclaimer: This note was dictated with voice recognition software. Similar sounding words can inadvertently be transcribed and may not be corrected upon review. Eilleen Kempf., MD 08/16/2013

## 2013-08-16 NOTE — Telephone Encounter (Signed)
gv pt appt schedule for march. per AJ cx remaining lb/tx appts after 3/9 as pt's xrt ends 3/10. per AJ she will send a pof for the cancellation. pt just needs lb/fu w/MM 2wks.

## 2013-08-17 ENCOUNTER — Ambulatory Visit

## 2013-08-17 ENCOUNTER — Encounter: Payer: Self-pay | Admitting: *Deleted

## 2013-08-17 NOTE — [facility] Oncology Navigator Note (Unsigned)
Care taker, Pam called.  She has lost a folder with business cards.  I looked at [facility] and asked around.  I was unable to find.  I called and spoke with care giver.  I told her I was unable to find.  She stated pt was unable to make radiation appt today.  I informed her of the importance of radiation appt.  She verbalized understanding.

## 2013-08-18 ENCOUNTER — Ambulatory Visit

## 2013-08-18 ENCOUNTER — Ambulatory Visit
Admission: RE | Admit: 2013-08-18 | Discharge: 2013-08-18 | Disposition: A | Discharge status: Home / Self Care | Source: Ambulatory Visit | Attending: Radiation Oncology | Admitting: Radiation Oncology

## 2013-08-18 DIAGNOSIS — Z510 Encounter for antineoplastic radiation therapy: Secondary | ICD-10-CM | POA: Diagnosis not present

## 2013-08-18 LAB — CULTURE, BLOOD (ROUTINE X 2)
Culture: NO GROWTH
Culture: NO GROWTH

## 2013-08-19 ENCOUNTER — Ambulatory Visit
Admission: RE | Admit: 2013-08-19 | Discharge: 2013-08-19 | Disposition: A | Discharge status: Home / Self Care | Source: Ambulatory Visit | Attending: Radiation Oncology | Admitting: Radiation Oncology

## 2013-08-19 ENCOUNTER — Ambulatory Visit

## 2013-08-19 DIAGNOSIS — Z510 Encounter for antineoplastic radiation therapy: Secondary | ICD-10-CM | POA: Diagnosis not present

## 2013-08-20 ENCOUNTER — Ambulatory Visit
Admission: RE | Admit: 2013-08-20 | Discharge: 2013-08-20 | Disposition: A | Discharge status: Home / Self Care | Source: Ambulatory Visit | Attending: Radiation Oncology | Admitting: Radiation Oncology

## 2013-08-20 ENCOUNTER — Ambulatory Visit

## 2013-08-20 DIAGNOSIS — C3410 Malignant neoplasm of upper lobe, unspecified bronchus or lung: Secondary | ICD-10-CM | POA: Insufficient documentation

## 2013-08-20 DIAGNOSIS — Z510 Encounter for antineoplastic radiation therapy: Secondary | ICD-10-CM | POA: Diagnosis not present

## 2013-08-20 DIAGNOSIS — C3491 Malignant neoplasm of unspecified part of right bronchus or lung: Secondary | ICD-10-CM

## 2013-08-20 NOTE — Progress Notes (Signed)
Weekly rad tx lung, 12 completed , has congestive cough clear sputum, had right wrist cultured by Dr. Crista Curb  08/18/13,  Has rash on front and back of chest, not using biafine, Dr. Crista Curb gave patient a different cream for her rash, she will bring in, pitting edema right leg/foot , left foot swelling, restarted 40 mg lasix today,stated patient , appetite fair, drank ensure this am, lemon pie yesterday, egg sandwich, slowly improving, stools formed, better,  no diarrhea declined to be weighed today, hurts to stand on swollen feet , energy coming back slow 11:23 AM

## 2013-08-20 NOTE — Progress Notes (Signed)
Department of Radiation Oncology  Phone:  [phone] Fax:        [phone]  Weekly Treatment Note    Name: Stephanie Oliver Date: 08/20/2013 MRN: [id number] DOB: 1951/05/19   Current dose: 30 Gy  Current fraction: 12   MEDICATIONS: Current Outpatient Prescriptions  Medication Sig Dispense Refill  . ALPRAZolam (XANAX) 0.5 MG tablet Take 1 tablet (0.5 mg total) by mouth 2 (two) times daily as needed for anxiety.  30 tablet  0  . amiodarone (PACERONE) 200 MG tablet Take 1 tablet (200 mg total) by mouth daily.  30 tablet  1  . apixaban (ELIQUIS) 5 MG TABS tablet Take 1 tablet (5 mg total) by mouth 2 (two) times daily.  60 tablet  1  . diclofenac sodium (VOLTAREN) 1 % GEL Apply 4 g topically 4 (four) times daily as needed (for pain).      Marland Kitchen digoxin (LANOXIN) 0.125 MG tablet Take 1 tablet (0.125 mg total) by mouth daily.  30 tablet  1  . diltiazem (CARDIZEM CD) 120 MG 24 hr capsule Take 1 capsule (120 mg total) by mouth daily.  30 capsule  1  . diphenoxylate-atropine (LOMOTIL) 2.5-0.025 MG per tablet Take 1 tablet by mouth 4 (four) times daily as needed for diarrhea or loose stools.      . docusate sodium (COLACE) 100 MG capsule Take 1 capsule (100 mg total) by mouth 2 (two) times daily. Continue this while taking narcotics to help with bowel movements  30 capsule  1  . feeding supplement, ENSURE COMPLETE, (ENSURE COMPLETE) LIQD Take 237 mLs by mouth 2 (two) times daily between meals.  60 Bottle  6  . folic acid (FOLVITE) 1 MG tablet Take 1 tablet (1 mg total) by mouth daily.  30 tablet  1  . furosemide (LASIX) 40 MG tablet Take 0.5 tablets (20 mg total) by mouth daily.  30 tablet  1  . HYDROcodone-acetaminophen (NORCO/VICODIN) 5-325 MG per tablet Take 1 tablet by mouth every 6 (six) hours as needed for moderate pain.      . iron polysaccharides (NIFEREX) 150 MG capsule Take 1 capsule (150 mg total) by mouth daily.  30 capsule  1  . levalbuterol (XOPENEX) 0.63 MG/3ML nebulizer solution  Take 0.63 mg by nebulization every 4 (four) hours as needed for wheezing or shortness of breath.      . levofloxacin (LEVAQUIN) 500 MG tablet Take 1 tablet (500 mg total) by mouth daily.  5 tablet  0  . methocarbamol (ROBAXIN) 500 MG tablet Take 1 tablet (500 mg total) by mouth every 6 (six) hours as needed for muscle spasms.  30 tablet  0  . metoprolol tartrate (LOPRESSOR) 25 MG tablet Take 25 mg by mouth 2 (two) times daily.      . Multiple Vitamin (MULTIVITAMIN WITH MINERALS) TABS tablet Take 1 tablet by mouth daily.      . pantoprazole (PROTONIX) 40 MG tablet Take 1 tablet (40 mg total) by mouth daily.  30 tablet  1  . potassium chloride SA (K-DUR,KLOR-CON) 20 MEQ tablet Take 1 tablet (20 mEq total) by mouth daily.  60 tablet  1  . prochlorperazine (COMPAZINE) 10 MG tablet       . thiamine 100 MG tablet Take 1 tablet (100 mg total) by mouth daily.  30 tablet  1   No current facility-administered medications for this encounter.     ALLERGIES: Paxil; Lisinopril; and Penicillins   LABORATORY DATA:  Lab Results  Component  Value Date   WBC 4.8 08/16/2013   HGB 9.9* 08/16/2013   HCT 29.5* 08/16/2013   MCV 91.9 08/16/2013   PLT 337 08/16/2013   Lab Results  Component Value Date   NA 135* 08/16/2013   K 3.8 08/16/2013   CL 90* 08/11/2013   CO2 29 08/16/2013   Lab Results  Component Value Date   ALT 14 08/16/2013   AST 15 08/16/2013   ALKPHOS 110 08/16/2013   BILITOT 0.34 08/16/2013     NARRATIVE: Stephanie Oliver was seen today for weekly treatment management. The chart was checked and the patient's films were reviewed. The patient states that she is doing relatively well with treatment. She has begun a skin cream given to her by her primary care physician for some radiation dermatitis in the anterior and posterior chest. This is mild to moderate at this point and should heal well after she finishes treatment next week. The patient has some swelling and began Lasix today.  PHYSICAL EXAMINATION:     radiation dermatitis anteriorly and posteriorly within the chest as noted above.  ASSESSMENT: The patient is doing satisfactorily with treatment.  PLAN: We will continue with the patient's radiation treatment as planned. The patient is doing well and denies any esophagitis at this time.   we discussed her letting us know if she develops any worsening esophagitis next week. Otherwise she will followup with one month.

## 2013-08-23 ENCOUNTER — Encounter: Payer: Self-pay | Admitting: *Deleted

## 2013-08-23 ENCOUNTER — Ambulatory Visit

## 2013-08-23 ENCOUNTER — Ambulatory Visit: Admitting: Nutrition

## 2013-08-23 ENCOUNTER — Ambulatory Visit (HOSPITAL_BASED_OUTPATIENT_CLINIC_OR_DEPARTMENT_OTHER)

## 2013-08-23 ENCOUNTER — Other Ambulatory Visit (HOSPITAL_BASED_OUTPATIENT_CLINIC_OR_DEPARTMENT_OTHER)

## 2013-08-23 ENCOUNTER — Ambulatory Visit
Admission: RE | Admit: 2013-08-23 | Discharge: 2013-08-23 | Disposition: A | Discharge status: Home / Self Care | Source: Ambulatory Visit | Attending: Radiation Oncology | Admitting: Radiation Oncology

## 2013-08-23 DIAGNOSIS — C3490 Malignant neoplasm of unspecified part of unspecified bronchus or lung: Secondary | ICD-10-CM

## 2013-08-23 DIAGNOSIS — C787 Secondary malignant neoplasm of liver and intrahepatic bile duct: Secondary | ICD-10-CM

## 2013-08-23 DIAGNOSIS — C3410 Malignant neoplasm of upper lobe, unspecified bronchus or lung: Secondary | ICD-10-CM

## 2013-08-23 DIAGNOSIS — Z510 Encounter for antineoplastic radiation therapy: Secondary | ICD-10-CM | POA: Diagnosis not present

## 2013-08-23 DIAGNOSIS — Z5111 Encounter for antineoplastic chemotherapy: Secondary | ICD-10-CM

## 2013-08-23 DIAGNOSIS — C3491 Malignant neoplasm of unspecified part of right bronchus or lung: Secondary | ICD-10-CM

## 2013-08-23 LAB — CBC WITH DIFFERENTIAL/PLATELET
BASO%: 0.3 % (ref 0.0–2.0)
BASOS ABS: 0 10*3/uL (ref 0.0–0.1)
EOS ABS: 0.1 10*3/uL (ref 0.0–0.5)
EOS%: 1.5 % (ref 0.0–7.0)
HCT: 32.6 % — ABNORMAL LOW (ref 34.8–46.6)
HEMOGLOBIN: 10.7 g/dL — AB (ref 11.6–15.9)
LYMPH#: 0.7 10*3/uL — AB (ref 0.9–3.3)
LYMPH%: 8.7 % — ABNORMAL LOW (ref 14.0–49.7)
MCH: 30.4 pg (ref 25.1–34.0)
MCHC: 32.8 g/dL (ref 31.5–36.0)
MCV: 92.6 fL (ref 79.5–101.0)
MONO#: 1.9 10*3/uL — AB (ref 0.1–0.9)
MONO%: 24.8 % — ABNORMAL HIGH (ref 0.0–14.0)
NEUT#: 4.9 10*3/uL (ref 1.5–6.5)
NEUT%: 64.7 % (ref 38.4–76.8)
NRBC: 0 % (ref 0–0)
Platelets: 314 10*3/uL (ref 145–400)
RBC: 3.52 10*6/uL — AB (ref 3.70–5.45)
RDW: 15.2 % — AB (ref 11.2–14.5)
WBC: 7.6 10*3/uL (ref 3.9–10.3)

## 2013-08-23 LAB — COMPREHENSIVE METABOLIC PANEL (CC13)
ALT: 15 U/L (ref 0–55)
AST: 13 U/L (ref 5–34)
Albumin: 2.8 g/dL — ABNORMAL LOW (ref 3.5–5.0)
Alkaline Phosphatase: 124 U/L (ref 40–150)
Anion Gap: 11 meq/L (ref 3–11)
BUN: 8.4 mg/dL (ref 7.0–26.0)
CO2: 30 meq/L — ABNORMAL HIGH (ref 22–29)
Calcium: 9.6 mg/dL (ref 8.4–10.4)
Chloride: 99 meq/L (ref 98–109)
Creatinine: 0.6 mg/dL (ref 0.6–1.1)
Glucose: 107 mg/dL (ref 70–140)
Potassium: 3.9 meq/L (ref 3.5–5.1)
Sodium: 140 meq/L (ref 136–145)
Total Bilirubin: 0.24 mg/dL (ref 0.20–1.20)
Total Protein: 6.9 g/dL (ref 6.4–8.3)

## 2013-08-23 MED ORDER — SODIUM CHLORIDE 0.9 % IV SOLN
Freq: Once | INTRAVENOUS | Status: AC
  Administered 2013-08-23: 11:00:00 via INTRAVENOUS

## 2013-08-23 MED ORDER — ONDANSETRON 16 MG/50ML IVPB (CHCC)
16.0000 mg | Freq: Once | INTRAVENOUS | Status: AC
  Administered 2013-08-23: 16 mg via INTRAVENOUS

## 2013-08-23 MED ORDER — DEXAMETHASONE SODIUM PHOSPHATE 20 MG/5ML IJ SOLN
20.0000 mg | Freq: Once | INTRAMUSCULAR | Status: AC
  Administered 2013-08-23: 20 mg via INTRAVENOUS

## 2013-08-23 MED ORDER — FAMOTIDINE IN NACL 20-0.9 MG/50ML-% IV SOLN
20.0000 mg | Freq: Once | INTRAVENOUS | Status: AC
  Administered 2013-08-23: 20 mg via INTRAVENOUS

## 2013-08-23 MED ORDER — DIPHENHYDRAMINE HCL 50 MG/ML IJ SOLN
INTRAMUSCULAR | Status: AC
  Filled 2013-08-23: qty 1

## 2013-08-23 MED ORDER — CARBOPLATIN CHEMO INJECTION 450 MG/45ML
190.0000 mg | Freq: Once | INTRAVENOUS | Status: AC
  Administered 2013-08-23: 190 mg via INTRAVENOUS
  Filled 2013-08-23: qty 19

## 2013-08-23 MED ORDER — ONDANSETRON 16 MG/50ML IVPB (CHCC)
INTRAVENOUS | Status: AC
  Filled 2013-08-23: qty 16

## 2013-08-23 MED ORDER — DEXAMETHASONE SODIUM PHOSPHATE 20 MG/5ML IJ SOLN
INTRAMUSCULAR | Status: AC
  Filled 2013-08-23: qty 5

## 2013-08-23 MED ORDER — FAMOTIDINE IN NACL 20-0.9 MG/50ML-% IV SOLN
INTRAVENOUS | Status: AC
  Filled 2013-08-23: qty 50

## 2013-08-23 MED ORDER — SODIUM CHLORIDE 0.9 % IV SOLN
45.0000 mg/m2 | Freq: Once | INTRAVENOUS | Status: AC
  Administered 2013-08-23: 72 mg via INTRAVENOUS
  Filled 2013-08-23: qty 12

## 2013-08-23 MED ORDER — DIPHENHYDRAMINE HCL 50 MG/ML IJ SOLN
50.0000 mg | Freq: Once | INTRAMUSCULAR | Status: AC
  Administered 2013-08-23: 50 mg via INTRAVENOUS

## 2013-08-23 NOTE — [facility] Oncology Navigator Note (Unsigned)
Went to visit pt in chemo today.  She is sleeping and I did not bother.

## 2013-08-23 NOTE — [facility] Oncology Navigator Note (Unsigned)
Pt daughter in law called.  I listened as she discussed concerns about paying for treatment.  Also she talked about getting a plan of care for pt.  I suggested after doctor's appt next Monday that she discuss with Dr. Julien Nordmann and Ander Purpura, social work.  She stated she would like that.   Will notify both Dr. Julien Nordmann and Ander Purpura.

## 2013-08-23 NOTE — Progress Notes (Signed)
Nutrition followup completed with patient in the chemotherapy area.  She is a non-small cell lung cancer patient receiving concurrent chemoradiation therapy.  Weight has increased and documented as 134.5 pounds on March 2.  Increased from 127 pounds February 13.  Patient is positive for bilateral edema of lower legs/feet.  She is began Lasix on Friday.  Weight gain likely a result of fluid retention.  Patient also has mucositis.  She reports she is craving sweets.  She tends to drink Ensure Plus once to twice daily.  Nutrition diagnosis: Food and nutrition related knowledge deficit continues.  Intervention: Patient education on the importance of increasing protein intake.  Provided fact sheet with a list of high protein foods. Recommended patient consume small amounts protein 6 times daily. Questions were answered.  Teach back method used.  Monitoring, evaluation, goals: Patient will tolerate adequate calories and increased protein to promote maintenance of lean body mass.  Next visit: Not Scheduled at this time.  Will continue to work with patient as needed throughout treatment.

## 2013-08-24 ENCOUNTER — Ambulatory Visit
Admission: RE | Admit: 2013-08-24 | Discharge: 2013-08-24 | Disposition: A | Discharge status: Home / Self Care | Source: Ambulatory Visit | Attending: Radiation Oncology | Admitting: Radiation Oncology

## 2013-08-24 ENCOUNTER — Ambulatory Visit

## 2013-08-24 DIAGNOSIS — Z510 Encounter for antineoplastic radiation therapy: Secondary | ICD-10-CM | POA: Diagnosis not present

## 2013-08-25 ENCOUNTER — Ambulatory Visit
Admission: RE | Admit: 2013-08-25 | Discharge: 2013-08-25 | Disposition: A | Discharge status: Home / Self Care | Source: Ambulatory Visit | Attending: Radiation Oncology | Admitting: Radiation Oncology

## 2013-08-25 ENCOUNTER — Encounter: Payer: Self-pay | Admitting: Radiation Oncology

## 2013-08-25 ENCOUNTER — Ambulatory Visit

## 2013-08-25 DIAGNOSIS — Z510 Encounter for antineoplastic radiation therapy: Secondary | ICD-10-CM | POA: Diagnosis not present

## 2013-08-30 ENCOUNTER — Other Ambulatory Visit: Payer: No Typology Code available for payment source

## 2013-08-30 ENCOUNTER — Ambulatory Visit: Payer: No Typology Code available for payment source

## 2013-08-30 ENCOUNTER — Ambulatory Visit (HOSPITAL_BASED_OUTPATIENT_CLINIC_OR_DEPARTMENT_OTHER): Admitting: Internal Medicine

## 2013-08-30 ENCOUNTER — Encounter: Payer: Self-pay | Admitting: Internal Medicine

## 2013-08-30 ENCOUNTER — Other Ambulatory Visit (HOSPITAL_BASED_OUTPATIENT_CLINIC_OR_DEPARTMENT_OTHER)

## 2013-08-30 VITALS — BP 119/64 | HR 119 | Temp 97.6°F | Resp 19 | Ht 63.0 in | Wt 127.5 lb

## 2013-08-30 DIAGNOSIS — C3490 Malignant neoplasm of unspecified part of unspecified bronchus or lung: Secondary | ICD-10-CM

## 2013-08-30 DIAGNOSIS — C787 Secondary malignant neoplasm of liver and intrahepatic bile duct: Secondary | ICD-10-CM

## 2013-08-30 DIAGNOSIS — R5383 Other fatigue: Secondary | ICD-10-CM

## 2013-08-30 DIAGNOSIS — R0609 Other forms of dyspnea: Secondary | ICD-10-CM

## 2013-08-30 DIAGNOSIS — C7951 Secondary malignant neoplasm of bone: Secondary | ICD-10-CM

## 2013-08-30 DIAGNOSIS — C7952 Secondary malignant neoplasm of bone marrow: Secondary | ICD-10-CM

## 2013-08-30 DIAGNOSIS — C3410 Malignant neoplasm of upper lobe, unspecified bronchus or lung: Secondary | ICD-10-CM

## 2013-08-30 DIAGNOSIS — R0989 Other specified symptoms and signs involving the circulatory and respiratory systems: Secondary | ICD-10-CM

## 2013-08-30 DIAGNOSIS — R5381 Other malaise: Secondary | ICD-10-CM

## 2013-08-30 LAB — COMPREHENSIVE METABOLIC PANEL (CC13)
ALT: 17 U/L (ref 0–55)
AST: 17 U/L (ref 5–34)
Albumin: 3.1 g/dL — ABNORMAL LOW (ref 3.5–5.0)
Alkaline Phosphatase: 112 U/L (ref 40–150)
Anion Gap: 16 mEq/L — ABNORMAL HIGH (ref 3–11)
BUN: 12 mg/dL (ref 7.0–26.0)
CO2: 23 mEq/L (ref 22–29)
Calcium: 9.5 mg/dL (ref 8.4–10.4)
Chloride: 100 mEq/L (ref 98–109)
Creatinine: 0.7 mg/dL (ref 0.6–1.1)
Glucose: 167 mg/dl — ABNORMAL HIGH (ref 70–140)
Potassium: 3.2 mEq/L — ABNORMAL LOW (ref 3.5–5.1)
Sodium: 139 mEq/L (ref 136–145)
Total Bilirubin: 0.34 mg/dL (ref 0.20–1.20)
Total Protein: 7 g/dL (ref 6.4–8.3)

## 2013-08-30 LAB — CBC WITH DIFFERENTIAL/PLATELET
BASO%: 0.1 % (ref 0.0–2.0)
Basophils Absolute: 0 10*3/uL (ref 0.0–0.1)
EOS%: 2.3 % (ref 0.0–7.0)
Eosinophils Absolute: 0.2 10*3/uL (ref 0.0–0.5)
HEMATOCRIT: 34 % — AB (ref 34.8–46.6)
HGB: 11.2 g/dL — ABNORMAL LOW (ref 11.6–15.9)
LYMPH#: 0.8 10*3/uL — AB (ref 0.9–3.3)
LYMPH%: 9.7 % — ABNORMAL LOW (ref 14.0–49.7)
MCH: 30.1 pg (ref 25.1–34.0)
MCHC: 32.8 g/dL (ref 31.5–36.0)
MCV: 91.7 fL (ref 79.5–101.0)
MONO#: 0.8 10*3/uL (ref 0.1–0.9)
MONO%: 9 % (ref 0.0–14.0)
NEUT#: 6.9 10*3/uL — ABNORMAL HIGH (ref 1.5–6.5)
NEUT%: 78.9 % — AB (ref 38.4–76.8)
Platelets: 307 10*3/uL (ref 145–400)
RBC: 3.71 10*6/uL (ref 3.70–5.45)
RDW: 16.7 % — ABNORMAL HIGH (ref 11.2–14.5)
WBC: 8.7 10*3/uL (ref 3.9–10.3)

## 2013-08-30 NOTE — Progress Notes (Signed)
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    CURRENT THERAPY: Systemic chemotherapy with carboplatin for AUC of 5 and paclitaxel 175 mg/M2 every 3 weeks with Neulasta support. 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   INTERVAL HISTORY: Stephanie Oliver 63 y.o. female returns for a scheduled regular symptom management visit for follow up accompanied by her brother, sister-in-law and 2 local caregivers. The patient is feeling fine today with no specific complaints except for mild fatigue. She denied having any significant chest pain but continues to have shortness breath with exertion. She denied having any fever or chills. She has no significant weight loss or night sweats. She tolerated the previous course of concurrent chemoradiation fairly well. This course was discontinued  shortly after the patient was found to have stage IV lung cancer. She is here today for evaluation and discussion of her treatment options.  MEDICAL HISTORY: Past Medical History  Diagnosis Date  . Depression   . Panic attacks   . Hypertension   . Smoker   . Alcohol abuse   . Cancer     ALLERGIES:  is allergic to paxil; lisinopril; and penicillins.  MEDICATIONS:  Current Outpatient Prescriptions  Medication Sig Dispense Refill  . ALPRAZolam (XANAX) 0.5 MG tablet Take 1 tablet (0.5 mg total) by mouth 2 (two) times daily as needed for anxiety.  30 tablet  0  . amiodarone (PACERONE) 200 MG tablet Take 1 tablet (200 mg total) by mouth daily.  30 tablet  1  . apixaban (ELIQUIS) 5 MG TABS tablet Take 1 tablet (5 mg total) by mouth 2 (two) times daily.  60 tablet  1  . diclofenac sodium (VOLTAREN) 1 % GEL Apply 4 g topically 4 (four) times daily as needed (for pain).      Marland Kitchen digoxin (LANOXIN) 0.125 MG tablet Take 1 tablet (0.125 mg total) by mouth daily.  30 tablet  1  . diltiazem (CARDIZEM CD) 180 MG 24 hr capsule Take 180 mg by mouth daily.      . diphenoxylate-atropine (LOMOTIL) 2.5-0.025 MG per tablet Take 1 tablet by mouth 4 (four) times daily as needed for diarrhea or loose stools.      . docusate sodium (COLACE) 100 MG capsule Take 1 capsule (100 mg total) by mouth 2 (two) times daily. Continue this while taking narcotics to help with bowel movements  30 capsule  1  .  feeding supplement, ENSURE COMPLETE, (ENSURE COMPLETE) LIQD Take 237 mLs by mouth 2 (two) times daily between meals.  60 Bottle  6  . folic acid (FOLVITE) 1 MG tablet Take 1 tablet (1 mg total) by mouth daily.  30 tablet  1  . furosemide (LASIX) 40 MG tablet Take 40 mg by mouth daily.      Marland Kitchen HYDROcodone-acetaminophen (NORCO/VICODIN) 5-325 MG per tablet Take 1 tablet by mouth every 6 (six) hours as needed for moderate pain.      . iron polysaccharides (NIFEREX) 150 MG capsule Take 1 capsule (150 mg total) by mouth daily.  30  capsule  1  . levalbuterol (XOPENEX) 0.63 MG/3ML nebulizer solution Take 0.63 mg by nebulization every 4 (four) hours as needed for wheezing or shortness of breath.      . metoprolol tartrate (LOPRESSOR) 25 MG tablet Take 25 mg by mouth 2 (two) times daily.      . Multiple Vitamin (MULTIVITAMIN WITH MINERALS) TABS tablet Take 1 tablet by mouth daily.      . pantoprazole (PROTONIX) 40 MG tablet Take 1 tablet (40 mg total) by mouth daily.  30 tablet  1  . potassium chloride SA (K-DUR,KLOR-CON) 20 MEQ tablet Take 1 tablet (20 mEq total) by mouth daily.  60 tablet  1  . prochlorperazine (COMPAZINE) 10 MG tablet       . thiamine 100 MG tablet Take 1 tablet (100 mg total) by mouth daily.  30 tablet  1   No current facility-administered medications for this visit.    SURGICAL HISTORY:  Past Surgical History  Procedure Laterality Date  . Tonsillectomy    . Back surgery    . Femur im nail Right 06/22/2013    Procedure: RIGHT HIP GAMMA NAIL FIXATION   ;  Surgeon: Renette Butters, MD;  Location: [facility];  Service: Orthopedics;  Laterality: Right;  . Video bronchoscopy with endobronchial ultrasound N/A 06/22/2013    Procedure: VIDEO BRONCHOSCOPY WITH ENDOBRONCHIAL ULTRASOUND;  Surgeon: Doree Fudge, MD;  Location: [facility];  Service: Pulmonary;  Laterality: N/A;    REVIEW OF SYSTEMS:  Constitutional: positive for fatigue Eyes: negative Ears, nose, mouth, throat, and face: negative Respiratory: positive for cough and dyspnea on exertion Cardiovascular: negative Gastrointestinal: negative Genitourinary:negative Integument/breast: negative Hematologic/lymphatic: negative Musculoskeletal:positive for bone pain Neurological: negative Behavioral/Psych: positive for anxiety Endocrine: negative Allergic/Immunologic: negative   PHYSICAL EXAMINATION: General appearance: alert, cooperative, appears stated age and no distress Head: Normocephalic, without obvious abnormality, atraumatic Neck: no  adenopathy, no carotid bruit, no JVD, supple, symmetrical, trachea midline and thyroid not enlarged, symmetric, no tenderness/mass/nodules Lymph nodes: Cervical, supraclavicular, and axillary nodes normal. Resp: clear to auscultation bilaterally Cardio: regular rate and rhythm, S1, S2 normal, no murmur, click, rub or gallop GI: soft, non-tender; bowel sounds normal; no masses,  no organomegaly Extremities: Patient continues to have pain related to her right hip stabilization surgery on 06/22/2013 as well as some right lower extremity edema Neurologic: Grossly normal  ECOG PERFORMANCE STATUS: 1 - Symptomatic but completely ambulatory  Blood pressure 119/64, pulse 119, temperature 97.6 F (36.4 C), temperature source Oral, resp. rate 19, height 5\' 3"  (1.6 m), weight 127 lb 8 oz (57.834 kg).  LABORATORY DATA: Lab Results  Component Value Date   WBC 8.7 08/30/2013   HGB 11.2* 08/30/2013   HCT 34.0* 08/30/2013   MCV 91.7 08/30/2013   PLT 307 08/30/2013      Chemistry      Component Value Date/Time  NA 139 08/30/2013 1431   NA 131* 08/11/2013 1744   K 3.2* 08/30/2013 1431   K 4.3 08/11/2013 1744   CL 90* 08/11/2013 1744   CO2 23 08/30/2013 1431   CO2 32 06/30/2013 1125   BUN 12.0 08/30/2013 1431   BUN 8 08/11/2013 1744   CREATININE 0.7 08/30/2013 1431   CREATININE 0.60 08/11/2013 1744      Component Value Date/Time   CALCIUM 9.5 08/30/2013 1431   CALCIUM 8.8 06/30/2013 1125   ALKPHOS 112 08/30/2013 1431   ALKPHOS 159* 06/30/2013 1125   AST 17 08/30/2013 1431   AST 31 06/30/2013 1125   ALT 17 08/30/2013 1431   ALT 43* 06/30/2013 1125   BILITOT 0.34 08/30/2013 1431   BILITOT 1.1 06/30/2013 1125       RADIOGRAPHIC STUDIES:  Dg Knee 1-2 Views Right  07/13/2013   CLINICAL DATA:  Knee pain.  EXAM: RIGHT KNEE - 1-2 VIEW  COMPARISON:  None.  FINDINGS: Intra medullary rod and screw noted. No acute bony abnormality. Degenerative changes noted about the right knee. Vascular calcification noted.   IMPRESSION: No acute abnormality. Intra medullary rod and screw node in the right femur. This appears well seated.   Electronically Signed   By: Marcello Moores  Register   On: 07/13/2013 09:18   Nm Pet Image Initial (pi) Skull Base To Thigh  08/04/2013   CLINICAL DATA:  Initial treatment strategy for non-small-cell lung cancer.  EXAM: NUCLEAR MEDICINE PET SKULL BASE TO THIGH  FASTING BLOOD GLUCOSE:  Value: 91 mg/dl  TECHNIQUE: 7.5 mCi F-18 FDG was injected intravenously. Full-ring PET imaging was performed from the skull base to thigh after the radiotracer. CT data was obtained and used for attenuation correction and anatomic localization.  COMPARISON:  Chest CT 06/21/2013.  FINDINGS: NECK  No hypermetabolic lymph nodes in the neck. However, there is a focus of hypermetabolism which localizes to right internal jugular vein (image 44 of series 4 and image 45 of series 605), which is of uncertain etiology and significance.  CHEST  Large mass in the medial aspect of the right upper lobe measures 7.4 x 5.8 cm and demonstrates peripheral hypermetabolism (SUVmax = 12.2)with central hypometabolism, likely to reflect central necrosis. This mass is highly aggressive in appearance with probable mediastinal invasion along the medial margin where it extends to make contact with the right internal jugular vein and superior vena cava (superior vena cava is markedly narrowed and slit-like in appearance immediately above the superior cavoatrial junction). There is also right hilar lymphadenopathy with a large right hilar nodal mass that measures approximately 5.4 x 5.7 cm and also demonstrates peripheral hypermetabolism (SUVmax = 9.4). There is also a hypermetabolic lower right paratracheal lymph node that measures at least 1.6 cm in short axis (SUVmax = 5.8). Extensive postobstructive changes are noted in the right upper and middle lobe, compatible with postobstructive pneumonitis. Similar findings are present to a lesser degree in the  dependent portion of the right lower lobe, and there is a small right-sided pleural effusion layering dependently. No definite pleural based hypermetabolism is noted at this time. There is atherosclerosis of the thoracic aorta, the great vessels of the mediastinum and the coronary arteries, including calcified atherosclerotic plaque in the left main, left anterior descending, left circumflex and right coronary arteries.  ABDOMEN/PELVIS  There are 2 hypermetabolic liver lesions, largest of which is in the inferior aspect of segment 3 measuring 4.2 x 2.8 cm (SUVmax = 10.2), while the smaller lesion is  in the central aspect of segment 8 of the liver measuring 1.9 x 1.9 cm (SUVmax = 12.2). Several tiny calcifications within the spleen are presumably calcified granulomas. The appearance of the gallbladder, pancreas, bilateral adrenal glands and right kidney is unremarkable. In the posterior aspect of the interpolar region of the left kidney there is a 7 mm high attenuation lesion which is not hypermetabolic, favored to represent a proteinaceous or hemorrhagic cyst. Atherosclerotic calcifications are noted throughout the abdominal and pelvic vasculature, without evidence of aneurysm. No definite hypermetabolic lymphadenopathy identified within the abdomen or pelvis.  SKELETON  Ill-defined area of sclerosis in the right side of the sacrum is diffusely hypermetabolic (SUVmax = 76.5). In addition, there is a 6 mm sclerotic focus in the anterior aspect of the left ilium (image 150 of series 4) that is hypermetabolic (SUVmax = 3.4). Postoperative changes of ORIF in the right hip are noted. Old healed fracture of the posterolateral aspect of the left eighth rib.  IMPRESSION: 1. Findings are compatible with stage IV lung cancer with a 7.4 x 5.8 cm right upper lobe mass that demonstrates direct mediastinal invasion, right hilar and low right paratracheal lymphadenopathy, in addition to multiple liver and bone metastases, as  detailed above. 2. Postobstructive changes in the right lung, predominately in the right upper and middle lobes, with a small right-sided pleural effusion which is simple in appearance at this time. 3. Atherosclerosis, including left main and 3 vessel coronary artery disease. 4. Additional incidental findings, as above.   Electronically Signed   By: Vinnie Langton M.D.   On: 08/04/2013 14:33     ASSESSMENT/PLAN:  This is a very pleasant 63 years old white female with recently diagnosed with stage IV non-small cell lung cancer with recent PET scan showing liver and bone metastasis. She has a large right upper lobe lung mass with direct mediastinal invasion. The patient completed a course of concurrent chemoradiation to the right upper lobe lung mass and mediastinum for 3 weeks.  I have a lengthy discussion with the patient and her family today about her current disease status and treatment options. I gave the patient the option of palliative care and hospice referral versus consideration of systemic chemotherapy with carboplatin for AUC of 5 and paclitaxel 175 mg/M2 every 3 weeks with Neulasta support. I discussed with the patient adverse effect of the chemotherapy including but not limited to alopecia, myelosuppression, nausea and vomiting, peripheral neuropathy, liver or renal dysfunction. The patient is interested in proceeding with systemic chemotherapy. She is expected to start the first cycle of this treatment on 09/07/2013. She would come back for followup visit in 4 weeks with the start of cycle #2 of her chemotherapy  She was advised to call immediately if she has any concerning symptoms in the interval. I spent 15 minutes of face-to-face counseling with the patient and her family out of the total visit time 25 minutes.  Disclaimer: This note was dictated with voice recognition software. Similar sounding words can inadvertently be transcribed and may not be corrected upon  review. Eilleen Kempf., MD 08/30/2013

## 2013-08-30 NOTE — Progress Notes (Signed)
Quick Note:  Call patient with the result and order K Dur 20 meq po qd x 7 days. She may need to call her cardiologist for continuous K supplements being on Lasix by her. ______

## 2013-08-31 ENCOUNTER — Telehealth: Payer: Self-pay | Admitting: Internal Medicine

## 2013-08-31 ENCOUNTER — Telehealth: Payer: Self-pay | Admitting: *Deleted

## 2013-08-31 NOTE — Telephone Encounter (Signed)
Called patient with lab result and informed her she may need to call her cardiologist for continuous potassium supplement.  Per Dr. Julien Nordmann.  Patient stated she is already taking potassium twice daily and cardiologist just increased her Lasix to 40 mg.  Informed patient to continue taking potassium and to call cardiologist and inform about low potassium.  Patient verbalized understanding.

## 2013-08-31 NOTE — Telephone Encounter (Signed)
Message copied by Norma Fredrickson on Tue Aug 31, 2013  3:53 PM ------      Message from: Britt Bottom      Created: Tue Aug 31, 2013  3:36 PM                   ----- Message -----         From: Curt Bears, MD         Sent: 08/30/2013   6:51 PM           To: Carlton Adam, PA-C, #            Call patient with the result and order K Dur 20 meq po qd x 7 days. She may need to call her cardiologist for continuous K supplements being on Lasix by her. ------

## 2013-08-31 NOTE — Telephone Encounter (Signed)
s.w pt and advised on March appts....pt will get new appt sched at nxt visit

## 2013-09-02 ENCOUNTER — Other Ambulatory Visit: Payer: Self-pay | Admitting: Physical Medicine and Rehabilitation

## 2013-09-06 ENCOUNTER — Other Ambulatory Visit: Payer: No Typology Code available for payment source

## 2013-09-06 ENCOUNTER — Other Ambulatory Visit: Payer: Self-pay | Admitting: Internal Medicine

## 2013-09-06 ENCOUNTER — Ambulatory Visit: Payer: No Typology Code available for payment source

## 2013-09-07 ENCOUNTER — Other Ambulatory Visit (HOSPITAL_BASED_OUTPATIENT_CLINIC_OR_DEPARTMENT_OTHER)

## 2013-09-07 ENCOUNTER — Ambulatory Visit (HOSPITAL_BASED_OUTPATIENT_CLINIC_OR_DEPARTMENT_OTHER)

## 2013-09-07 VITALS — BP 121/81 | HR 112 | Temp 98.1°F | Resp 20

## 2013-09-07 DIAGNOSIS — C3410 Malignant neoplasm of upper lobe, unspecified bronchus or lung: Secondary | ICD-10-CM

## 2013-09-07 DIAGNOSIS — C787 Secondary malignant neoplasm of liver and intrahepatic bile duct: Secondary | ICD-10-CM

## 2013-09-07 DIAGNOSIS — C7952 Secondary malignant neoplasm of bone marrow: Secondary | ICD-10-CM

## 2013-09-07 DIAGNOSIS — Z5111 Encounter for antineoplastic chemotherapy: Secondary | ICD-10-CM

## 2013-09-07 DIAGNOSIS — C3491 Malignant neoplasm of unspecified part of right bronchus or lung: Secondary | ICD-10-CM

## 2013-09-07 DIAGNOSIS — C3490 Malignant neoplasm of unspecified part of unspecified bronchus or lung: Secondary | ICD-10-CM

## 2013-09-07 DIAGNOSIS — C7951 Secondary malignant neoplasm of bone: Secondary | ICD-10-CM

## 2013-09-07 LAB — CBC WITH DIFFERENTIAL/PLATELET
BASO%: 0.2 % (ref 0.0–2.0)
Basophils Absolute: 0 10*3/uL (ref 0.0–0.1)
EOS ABS: 0.2 10*3/uL (ref 0.0–0.5)
EOS%: 2.5 % (ref 0.0–7.0)
HCT: 34.7 % — ABNORMAL LOW (ref 34.8–46.6)
HEMOGLOBIN: 11.6 g/dL (ref 11.6–15.9)
LYMPH#: 0.9 10*3/uL (ref 0.9–3.3)
LYMPH%: 13.8 % — ABNORMAL LOW (ref 14.0–49.7)
MCH: 30.2 pg (ref 25.1–34.0)
MCHC: 33.4 g/dL (ref 31.5–36.0)
MCV: 90.4 fL (ref 79.5–101.0)
MONO#: 1.3 10*3/uL — ABNORMAL HIGH (ref 0.1–0.9)
MONO%: 19.9 % — ABNORMAL HIGH (ref 0.0–14.0)
NEUT%: 63.6 % (ref 38.4–76.8)
NEUTROS ABS: 4.2 10*3/uL (ref 1.5–6.5)
Platelets: 264 10*3/uL (ref 145–400)
RBC: 3.84 10*6/uL (ref 3.70–5.45)
RDW: 15.9 % — AB (ref 11.2–14.5)
WBC: 6.5 10*3/uL (ref 3.9–10.3)

## 2013-09-07 LAB — COMPREHENSIVE METABOLIC PANEL (CC13)
ALBUMIN: 3.2 g/dL — AB (ref 3.5–5.0)
ALT: 13 U/L (ref 0–55)
AST: 17 U/L (ref 5–34)
Alkaline Phosphatase: 118 U/L (ref 40–150)
Anion Gap: 12 mEq/L — ABNORMAL HIGH (ref 3–11)
BUN: 10.6 mg/dL (ref 7.0–26.0)
CHLORIDE: 100 meq/L (ref 98–109)
CO2: 24 mEq/L (ref 22–29)
Calcium: 9.9 mg/dL (ref 8.4–10.4)
Creatinine: 0.7 mg/dL (ref 0.6–1.1)
GLUCOSE: 121 mg/dL (ref 70–140)
POTASSIUM: 4.3 meq/L (ref 3.5–5.1)
Sodium: 136 mEq/L (ref 136–145)
TOTAL PROTEIN: 7.1 g/dL (ref 6.4–8.3)
Total Bilirubin: 0.48 mg/dL (ref 0.20–1.20)

## 2013-09-07 MED ORDER — SODIUM CHLORIDE 0.9 % IV SOLN
Freq: Once | INTRAVENOUS | Status: AC
  Administered 2013-09-07: 12:00:00 via INTRAVENOUS

## 2013-09-07 MED ORDER — ONDANSETRON 16 MG/50ML IVPB (CHCC)
16.0000 mg | Freq: Once | INTRAVENOUS | Status: AC
  Administered 2013-09-07: 16 mg via INTRAVENOUS

## 2013-09-07 MED ORDER — FAMOTIDINE IN NACL 20-0.9 MG/50ML-% IV SOLN
INTRAVENOUS | Status: AC
  Filled 2013-09-07: qty 50

## 2013-09-07 MED ORDER — SODIUM CHLORIDE 0.9 % IV SOLN
449.0000 mg | Freq: Once | INTRAVENOUS | Status: AC
  Administered 2013-09-07: 450 mg via INTRAVENOUS
  Filled 2013-09-07: qty 45

## 2013-09-07 MED ORDER — SODIUM CHLORIDE 0.9 % IV SOLN
175.0000 mg/m2 | Freq: Once | INTRAVENOUS | Status: AC
  Administered 2013-09-07: 282 mg via INTRAVENOUS
  Filled 2013-09-07: qty 47

## 2013-09-07 MED ORDER — DIPHENHYDRAMINE HCL 50 MG/ML IJ SOLN
50.0000 mg | Freq: Once | INTRAMUSCULAR | Status: AC
  Administered 2013-09-07: 50 mg via INTRAVENOUS

## 2013-09-07 MED ORDER — FAMOTIDINE IN NACL 20-0.9 MG/50ML-% IV SOLN
20.0000 mg | Freq: Once | INTRAVENOUS | Status: AC
  Administered 2013-09-07: 20 mg via INTRAVENOUS

## 2013-09-07 MED ORDER — DEXAMETHASONE SODIUM PHOSPHATE 20 MG/5ML IJ SOLN
20.0000 mg | Freq: Once | INTRAMUSCULAR | Status: AC
  Administered 2013-09-07: 20 mg via INTRAVENOUS

## 2013-09-07 MED ORDER — DIPHENHYDRAMINE HCL 50 MG/ML IJ SOLN
INTRAMUSCULAR | Status: AC
  Filled 2013-09-07: qty 1

## 2013-09-07 MED ORDER — ONDANSETRON 16 MG/50ML IVPB (CHCC)
INTRAVENOUS | Status: AC
  Filled 2013-09-07: qty 16

## 2013-09-07 MED ORDER — DEXAMETHASONE SODIUM PHOSPHATE 20 MG/5ML IJ SOLN
INTRAMUSCULAR | Status: AC
  Filled 2013-09-07: qty 5

## 2013-09-07 NOTE — Progress Notes (Addendum)
  Radiation Oncology         [phone] ________________________________  Name: Stephanie Oliver MRN: [id number]  Date: 08/25/2013  DOB: 29-Jun-1950  End of Treatment Note  Diagnosis:   Lung cancer     Indication for treatment:  Palliative       Radiation treatment dates:   07/28/2013 through 08/25/2013  Site/dose:   The patient was treated in a palliative manner to the gross disease within the lung including a large right-sided lung tumor. The patient received 37.5 gray in 15 fractions at 2.5 gray per fraction using a three-field 3-D conformal technique. The patient received concurrent chemotherapy during this treatment.  Narrative: The patient tolerated radiation treatment relatively well.   She did not have Stancil difficulties with acute toxicity. The patient did not exhibit any worsening shortness of breath during her treatment.  Plan: The patient has completed radiation treatment. The patient will return to radiation oncology clinic for routine followup in one month. I advised the patient to call or return sooner if they have any questions or concerns related to their recovery or treatment. ________________________________  Jodelle Gross, M.D., Ph.D.

## 2013-09-07 NOTE — Addendum Note (Signed)
Encounter addended by: Marye Round, MD on: 09/07/2013  7:29 AM<BR>     Documentation filed: Notes Section

## 2013-09-07 NOTE — Patient Instructions (Signed)
[facility] Discharge Instructions for Patients Receiving Chemotherapy  Today you received the following chemotherapy agents :  Taxol & carboplatin  To help prevent nausea and vomiting after your treatment, we encourage you to take your nausea medication    If you develop nausea and vomiting that is not controlled by your nausea medication, call the clinic.   BELOW ARE SYMPTOMS THAT SHOULD BE REPORTED IMMEDIATELY:  *FEVER GREATER THAN 100.5 F  *CHILLS WITH OR WITHOUT FEVER  NAUSEA AND VOMITING THAT IS NOT CONTROLLED WITH YOUR NAUSEA MEDICATION  *UNUSUAL SHORTNESS OF BREATH  *UNUSUAL BRUISING OR BLEEDING  TENDERNESS IN MOUTH AND THROAT WITH OR WITHOUT PRESENCE OF ULCERS  *URINARY PROBLEMS  *BOWEL PROBLEMS  UNUSUAL RASH Items with * indicate a potential emergency and should be followed up as soon as possible.  Feel free to call the clinic you have any questions or concerns. The clinic phone number is [phone].

## 2013-09-08 ENCOUNTER — Other Ambulatory Visit: Payer: Self-pay | Admitting: Physical Medicine and Rehabilitation

## 2013-09-08 ENCOUNTER — Telehealth: Payer: Self-pay | Admitting: *Deleted

## 2013-09-08 ENCOUNTER — Ambulatory Visit (HOSPITAL_BASED_OUTPATIENT_CLINIC_OR_DEPARTMENT_OTHER)

## 2013-09-08 VITALS — BP 132/79 | HR 110 | Temp 97.7°F

## 2013-09-08 DIAGNOSIS — C3410 Malignant neoplasm of upper lobe, unspecified bronchus or lung: Secondary | ICD-10-CM

## 2013-09-08 DIAGNOSIS — C787 Secondary malignant neoplasm of liver and intrahepatic bile duct: Secondary | ICD-10-CM

## 2013-09-08 DIAGNOSIS — C7952 Secondary malignant neoplasm of bone marrow: Secondary | ICD-10-CM

## 2013-09-08 DIAGNOSIS — C3491 Malignant neoplasm of unspecified part of right bronchus or lung: Secondary | ICD-10-CM

## 2013-09-08 DIAGNOSIS — Z5189 Encounter for other specified aftercare: Secondary | ICD-10-CM

## 2013-09-08 DIAGNOSIS — C7951 Secondary malignant neoplasm of bone: Secondary | ICD-10-CM

## 2013-09-08 MED ORDER — PEGFILGRASTIM INJECTION 6 MG/0.6ML
6.0000 mg | Freq: Once | SUBCUTANEOUS | Status: AC
  Administered 2013-09-08: 6 mg via SUBCUTANEOUS
  Filled 2013-09-08: qty 0.6

## 2013-09-08 NOTE — Telephone Encounter (Signed)
Stephanie Oliver here for Neulasta injection following 1st chemo treatment.  States that she is doing great.  No nausea, vomiting or diarrhea.  Is drinking her fluids and eating well.  All questions answered.  Knows to call if she has any problems or concerns.

## 2013-09-08 NOTE — Patient Instructions (Signed)

## 2013-09-13 ENCOUNTER — Other Ambulatory Visit: Payer: No Typology Code available for payment source

## 2013-09-13 ENCOUNTER — Ambulatory Visit: Payer: No Typology Code available for payment source

## 2013-09-14 ENCOUNTER — Other Ambulatory Visit (HOSPITAL_BASED_OUTPATIENT_CLINIC_OR_DEPARTMENT_OTHER)

## 2013-09-14 ENCOUNTER — Telehealth: Payer: Self-pay | Admitting: *Deleted

## 2013-09-14 DIAGNOSIS — C3490 Malignant neoplasm of unspecified part of unspecified bronchus or lung: Secondary | ICD-10-CM

## 2013-09-14 DIAGNOSIS — C3410 Malignant neoplasm of upper lobe, unspecified bronchus or lung: Secondary | ICD-10-CM

## 2013-09-14 LAB — COMPREHENSIVE METABOLIC PANEL (CC13)
ALT: 18 U/L (ref 0–55)
AST: 17 U/L (ref 5–34)
Albumin: 3.4 g/dL — ABNORMAL LOW (ref 3.5–5.0)
Alkaline Phosphatase: 140 U/L (ref 40–150)
Anion Gap: 12 mEq/L — ABNORMAL HIGH (ref 3–11)
BUN: 15 mg/dL (ref 7.0–26.0)
CALCIUM: 9.9 mg/dL (ref 8.4–10.4)
CHLORIDE: 98 meq/L (ref 98–109)
CO2: 26 meq/L (ref 22–29)
Creatinine: 0.7 mg/dL (ref 0.6–1.1)
Glucose: 142 mg/dl — ABNORMAL HIGH (ref 70–140)
Potassium: 4 mEq/L (ref 3.5–5.1)
SODIUM: 136 meq/L (ref 136–145)
TOTAL PROTEIN: 7.3 g/dL (ref 6.4–8.3)
Total Bilirubin: 0.51 mg/dL (ref 0.20–1.20)

## 2013-09-14 LAB — CBC WITH DIFFERENTIAL/PLATELET
BASO%: 0 % (ref 0.0–2.0)
Basophils Absolute: 0 10*3/uL (ref 0.0–0.1)
EOS ABS: 0.1 10*3/uL (ref 0.0–0.5)
EOS%: 6.8 % (ref 0.0–7.0)
HCT: 32.5 % — ABNORMAL LOW (ref 34.8–46.6)
HGB: 10.7 g/dL — ABNORMAL LOW (ref 11.6–15.9)
LYMPH#: 0.3 10*3/uL — AB (ref 0.9–3.3)
LYMPH%: 33 % (ref 14.0–49.7)
MCH: 29.2 pg (ref 25.1–34.0)
MCHC: 32.9 g/dL (ref 31.5–36.0)
MCV: 88.6 fL (ref 79.5–101.0)
MONO#: 0.2 10*3/uL (ref 0.1–0.9)
MONO%: 26.1 % — ABNORMAL HIGH (ref 0.0–14.0)
NEUT%: 34.1 % — ABNORMAL LOW (ref 38.4–76.8)
NEUTROS ABS: 0.3 10*3/uL — AB (ref 1.5–6.5)
NRBC: 0 % (ref 0–0)
Platelets: 176 10*3/uL (ref 145–400)
RBC: 3.67 10*6/uL — AB (ref 3.70–5.45)
RDW: 15.7 % — AB (ref 11.2–14.5)
WBC: 0.9 10*3/uL — AB (ref 3.9–10.3)

## 2013-09-14 NOTE — Telephone Encounter (Signed)
Pt called back, informed her regarding neutropenic pxns.  Pt verbalized understanding.  SLJ

## 2013-09-14 NOTE — Telephone Encounter (Signed)
ANC 0.3, WBC 0.9.  Per Dr Vista Mink, pt needs neutropenic pxns.  Called and left msgs on home and cell to call us back.  SLJ

## 2013-09-15 ENCOUNTER — Other Ambulatory Visit: Payer: Self-pay | Admitting: *Deleted

## 2013-09-15 DIAGNOSIS — C3491 Malignant neoplasm of unspecified part of right bronchus or lung: Secondary | ICD-10-CM

## 2013-09-15 MED ORDER — OXYCODONE HCL 5 MG PO TABS: 5.0000 mg | ORAL_TABLET | ORAL | Status: DC | PRN

## 2013-09-16 ENCOUNTER — Telehealth: Payer: Self-pay | Admitting: *Deleted

## 2013-09-16 NOTE — Telephone Encounter (Signed)
Pt's private nurse Moshe Salisbury [phone]) called stating that pt needs refills on her iron tablets, eliquis, folic acid, and eventually her inhalers.  Pt was in the room, got on the phone and said it was okay for this RN to speak to [facility].  Per Dr Vista Mink, pt needs to contact his PCP regarding any refills on these medications.  Pt also requesting a new rx for pain.  She continues to have pain in her hip.  Per Dr Vista Mink, okay to give oxy IR 5mg  1 tablet q4-6 hours prn.  SLJ

## 2013-09-20 ENCOUNTER — Other Ambulatory Visit: Payer: No Typology Code available for payment source

## 2013-09-20 ENCOUNTER — Ambulatory Visit: Payer: No Typology Code available for payment source

## 2013-09-21 ENCOUNTER — Other Ambulatory Visit (HOSPITAL_BASED_OUTPATIENT_CLINIC_OR_DEPARTMENT_OTHER)

## 2013-09-21 DIAGNOSIS — C3490 Malignant neoplasm of unspecified part of unspecified bronchus or lung: Secondary | ICD-10-CM

## 2013-09-21 DIAGNOSIS — C3410 Malignant neoplasm of upper lobe, unspecified bronchus or lung: Secondary | ICD-10-CM

## 2013-09-21 LAB — CBC WITH DIFFERENTIAL/PLATELET
BASO%: 0.2 % (ref 0.0–2.0)
BASOS ABS: 0 10*3/uL (ref 0.0–0.1)
EOS%: 0.4 % (ref 0.0–7.0)
Eosinophils Absolute: 0 10*3/uL (ref 0.0–0.5)
HCT: 30.9 % — ABNORMAL LOW (ref 34.8–46.6)
HGB: 10.2 g/dL — ABNORMAL LOW (ref 11.6–15.9)
LYMPH%: 7.3 % — ABNORMAL LOW (ref 14.0–49.7)
MCH: 29.7 pg (ref 25.1–34.0)
MCHC: 33.1 g/dL (ref 31.5–36.0)
MCV: 89.9 fL (ref 79.5–101.0)
MONO#: 1.7 10*3/uL — ABNORMAL HIGH (ref 0.1–0.9)
MONO%: 14.8 % — ABNORMAL HIGH (ref 0.0–14.0)
NEUT#: 8.8 10*3/uL — ABNORMAL HIGH (ref 1.5–6.5)
NEUT%: 77.3 % — AB (ref 38.4–76.8)
Platelets: 189 10*3/uL (ref 145–400)
RBC: 3.44 10*6/uL — ABNORMAL LOW (ref 3.70–5.45)
RDW: 16.5 % — ABNORMAL HIGH (ref 11.2–14.5)
WBC: 11.3 10*3/uL — ABNORMAL HIGH (ref 3.9–10.3)
lymph#: 0.8 10*3/uL — ABNORMAL LOW (ref 0.9–3.3)

## 2013-09-21 LAB — COMPREHENSIVE METABOLIC PANEL (CC13)
ALK PHOS: 125 U/L (ref 40–150)
ALT: 14 U/L (ref 0–55)
AST: 14 U/L (ref 5–34)
Albumin: 2.9 g/dL — ABNORMAL LOW (ref 3.5–5.0)
Anion Gap: 10 mEq/L (ref 3–11)
BUN: 8.4 mg/dL (ref 7.0–26.0)
CO2: 30 mEq/L — ABNORMAL HIGH (ref 22–29)
CREATININE: 0.6 mg/dL (ref 0.6–1.1)
Calcium: 9.3 mg/dL (ref 8.4–10.4)
Chloride: 100 mEq/L (ref 98–109)
Glucose: 162 mg/dl — ABNORMAL HIGH (ref 70–140)
Potassium: 3.4 mEq/L — ABNORMAL LOW (ref 3.5–5.1)
Sodium: 141 mEq/L (ref 136–145)
Total Bilirubin: 0.26 mg/dL (ref 0.20–1.20)
Total Protein: 6.6 g/dL (ref 6.4–8.3)

## 2013-09-23 ENCOUNTER — Telehealth: Payer: Self-pay | Admitting: *Deleted

## 2013-09-23 NOTE — Telephone Encounter (Signed)
Pt called wanting to know CBC results from this week.  Results given to pt, she verbalized understanding.  SLJ

## 2013-09-24 ENCOUNTER — Encounter: Payer: Self-pay | Admitting: Radiation Oncology

## 2013-09-27 ENCOUNTER — Ambulatory Visit (INDEPENDENT_AMBULATORY_CARE_PROVIDER_SITE_OTHER): Admitting: Podiatry

## 2013-09-27 ENCOUNTER — Encounter: Payer: Self-pay | Admitting: Podiatry

## 2013-09-27 VITALS — BP 107/65 | HR 105 | Resp 19 | Ht 63.0 in | Wt 125.0 lb

## 2013-09-27 DIAGNOSIS — B351 Tinea unguium: Secondary | ICD-10-CM

## 2013-09-27 DIAGNOSIS — M79609 Pain in unspecified limb: Secondary | ICD-10-CM

## 2013-09-27 NOTE — Progress Notes (Signed)
   Subjective:    Patient ID: Stephanie Oliver, female    DOB: Jul 11, 1950, 63 y.o.   MRN: [id number]  HPI Comments: N toenail problems, debridement L B/L 1 - 5 toenails D and O long-term C elongated, thick  A difficult to cut T hx of home pedicure     Review of Systems  Constitutional: Positive for chills, activity change, appetite change, fatigue and unexpected weight change.  HENT: Positive for sinus pressure and tinnitus.   Eyes: Negative.   Respiratory: Positive for cough, chest tightness and shortness of breath.   Cardiovascular: Positive for leg swelling.       Calf pain with walking  Gastrointestinal: Positive for nausea, constipation and abdominal distention.  Endocrine: Positive for cold intolerance.  Genitourinary: Positive for urgency and frequency.  Musculoskeletal: Positive for arthralgias, back pain, gait problem and myalgias.  Skin: Negative.   Allergic/Immunologic: Positive for environmental allergies.  Neurological: Positive for weakness, light-headedness and headaches.  Hematological: Bruises/bleeds easily.       Slow to heal  Psychiatric/Behavioral: Positive for confusion. The patient is nervous/anxious.        Objective:   Physical Exam  Orientated x55 space 51 year old white female presents with a friend  She transfers from wheelchair to treatment chair.  Vascular: DP and PT pulses 2/4 bilaterally.  Neurological: Sensation to 10 g monofilament wire intact 5/5 bilaterally. Vibratory sensation diminished right  Dermatological: Pitting edema right leg noted. All 10 toenails are elongated, brittle, discolored and tender to palpation. Dry scaling skin noted plantarly bilaterally  Musculoskeletal: The left leg appears visibly longer than the right..      Assessment & Plan:   Assessment Gait disturbance Satisfactory vascular status Symptomatic onychomycoses x10 Leg length discrepancy Dry skin bilaterally  Plan: Nails x10 are debrided without any  bleeding. Patient advised to apply all-purpose skin lotion to feet daily Reappoint at three-month intervals for toenail debridement

## 2013-09-27 NOTE — Patient Instructions (Signed)
Apply all-purpose skin lotion to feet on a daily basis.

## 2013-09-28 ENCOUNTER — Ambulatory Visit: Admitting: Nutrition

## 2013-09-28 ENCOUNTER — Ambulatory Visit (HOSPITAL_BASED_OUTPATIENT_CLINIC_OR_DEPARTMENT_OTHER): Admitting: Internal Medicine

## 2013-09-28 ENCOUNTER — Ambulatory Visit (HOSPITAL_BASED_OUTPATIENT_CLINIC_OR_DEPARTMENT_OTHER)

## 2013-09-28 ENCOUNTER — Other Ambulatory Visit (HOSPITAL_BASED_OUTPATIENT_CLINIC_OR_DEPARTMENT_OTHER)

## 2013-09-28 ENCOUNTER — Telehealth: Payer: Self-pay | Admitting: Medical Oncology

## 2013-09-28 ENCOUNTER — Encounter: Payer: Self-pay | Admitting: Internal Medicine

## 2013-09-28 ENCOUNTER — Encounter: Payer: Self-pay | Admitting: Podiatry

## 2013-09-28 ENCOUNTER — Telehealth: Payer: Self-pay | Admitting: Internal Medicine

## 2013-09-28 VITALS — BP 100/60 | HR 114 | Temp 98.3°F | Resp 17 | Ht 63.0 in | Wt 124.5 lb

## 2013-09-28 DIAGNOSIS — C787 Secondary malignant neoplasm of liver and intrahepatic bile duct: Secondary | ICD-10-CM

## 2013-09-28 DIAGNOSIS — R5383 Other fatigue: Secondary | ICD-10-CM

## 2013-09-28 DIAGNOSIS — C3410 Malignant neoplasm of upper lobe, unspecified bronchus or lung: Secondary | ICD-10-CM

## 2013-09-28 DIAGNOSIS — C3491 Malignant neoplasm of unspecified part of right bronchus or lung: Secondary | ICD-10-CM

## 2013-09-28 DIAGNOSIS — C7952 Secondary malignant neoplasm of bone marrow: Secondary | ICD-10-CM

## 2013-09-28 DIAGNOSIS — R0609 Other forms of dyspnea: Secondary | ICD-10-CM

## 2013-09-28 DIAGNOSIS — R5381 Other malaise: Secondary | ICD-10-CM

## 2013-09-28 DIAGNOSIS — R0989 Other specified symptoms and signs involving the circulatory and respiratory systems: Secondary | ICD-10-CM

## 2013-09-28 DIAGNOSIS — C7951 Secondary malignant neoplasm of bone: Secondary | ICD-10-CM

## 2013-09-28 DIAGNOSIS — C3490 Malignant neoplasm of unspecified part of unspecified bronchus or lung: Secondary | ICD-10-CM

## 2013-09-28 DIAGNOSIS — Z5111 Encounter for antineoplastic chemotherapy: Secondary | ICD-10-CM

## 2013-09-28 LAB — CBC WITH DIFFERENTIAL/PLATELET
BASO%: 0.1 % (ref 0.0–2.0)
BASOS ABS: 0 10*3/uL (ref 0.0–0.1)
EOS ABS: 0.1 10*3/uL (ref 0.0–0.5)
EOS%: 0.5 % (ref 0.0–7.0)
HCT: 30.4 % — ABNORMAL LOW (ref 34.8–46.6)
HEMOGLOBIN: 10 g/dL — AB (ref 11.6–15.9)
LYMPH%: 6.5 % — AB (ref 14.0–49.7)
MCH: 29.5 pg (ref 25.1–34.0)
MCHC: 32.9 g/dL (ref 31.5–36.0)
MCV: 89.7 fL (ref 79.5–101.0)
MONO#: 2.1 10*3/uL — ABNORMAL HIGH (ref 0.1–0.9)
MONO%: 21.3 % — AB (ref 0.0–14.0)
NEUT%: 71.6 % (ref 38.4–76.8)
NEUTROS ABS: 7.2 10*3/uL — AB (ref 1.5–6.5)
PLATELETS: 317 10*3/uL (ref 145–400)
RBC: 3.39 10*6/uL — ABNORMAL LOW (ref 3.70–5.45)
RDW: 17.1 % — ABNORMAL HIGH (ref 11.2–14.5)
WBC: 10.1 10*3/uL (ref 3.9–10.3)
lymph#: 0.7 10*3/uL — ABNORMAL LOW (ref 0.9–3.3)
nRBC: 0 % (ref 0–0)

## 2013-09-28 LAB — COMPREHENSIVE METABOLIC PANEL (CC13)
ALT: 11 U/L (ref 0–55)
ANION GAP: 15 meq/L — AB (ref 3–11)
AST: 16 U/L (ref 5–34)
Albumin: 3.1 g/dL — ABNORMAL LOW (ref 3.5–5.0)
Alkaline Phosphatase: 116 U/L (ref 40–150)
BILIRUBIN TOTAL: 0.47 mg/dL (ref 0.20–1.20)
BUN: 7.9 mg/dL (ref 7.0–26.0)
CO2: 28 meq/L (ref 22–29)
CREATININE: 0.7 mg/dL (ref 0.6–1.1)
Calcium: 9.7 mg/dL (ref 8.4–10.4)
Chloride: 97 mEq/L — ABNORMAL LOW (ref 98–109)
Glucose: 117 mg/dl (ref 70–140)
Potassium: 3.3 mEq/L — ABNORMAL LOW (ref 3.5–5.1)
SODIUM: 139 meq/L (ref 136–145)
TOTAL PROTEIN: 7.4 g/dL (ref 6.4–8.3)

## 2013-09-28 MED ORDER — DEXAMETHASONE SODIUM PHOSPHATE 20 MG/5ML IJ SOLN
20.0000 mg | Freq: Once | INTRAMUSCULAR | Status: AC
Start: 2013-09-28 — End: 2013-09-28
  Administered 2013-09-28: 20 mg via INTRAVENOUS

## 2013-09-28 MED ORDER — FAMOTIDINE IN NACL 20-0.9 MG/50ML-% IV SOLN
INTRAVENOUS | Status: AC
  Filled 2013-09-28: qty 50

## 2013-09-28 MED ORDER — FAMOTIDINE IN NACL 20-0.9 MG/50ML-% IV SOLN
20.0000 mg | Freq: Once | INTRAVENOUS | Status: AC
  Administered 2013-09-28: 20 mg via INTRAVENOUS

## 2013-09-28 MED ORDER — DIPHENHYDRAMINE HCL 50 MG/ML IJ SOLN
50.0000 mg | Freq: Once | INTRAMUSCULAR | Status: AC
  Administered 2013-09-28: 50 mg via INTRAVENOUS

## 2013-09-28 MED ORDER — SODIUM CHLORIDE 0.9 % IV SOLN
Freq: Once | INTRAVENOUS | Status: AC
  Administered 2013-09-28: 10:00:00 via INTRAVENOUS

## 2013-09-28 MED ORDER — ONDANSETRON 16 MG/50ML IVPB (CHCC)
INTRAVENOUS | Status: AC
  Filled 2013-09-28: qty 16

## 2013-09-28 MED ORDER — OXYCODONE HCL 5 MG PO TABS: 5.0000 mg | ORAL_TABLET | ORAL | Status: DC | PRN

## 2013-09-28 MED ORDER — SODIUM CHLORIDE 0.9 % IV SOLN
449.0000 mg | Freq: Once | INTRAVENOUS | Status: AC
  Administered 2013-09-28: 450 mg via INTRAVENOUS
  Filled 2013-09-28: qty 45

## 2013-09-28 MED ORDER — ONDANSETRON 16 MG/50ML IVPB (CHCC)
16.0000 mg | Freq: Once | INTRAVENOUS | Status: AC
  Administered 2013-09-28: 16 mg via INTRAVENOUS

## 2013-09-28 MED ORDER — SODIUM CHLORIDE 0.9 % IV SOLN
175.0000 mg/m2 | Freq: Once | INTRAVENOUS | Status: AC
  Administered 2013-09-28: 282 mg via INTRAVENOUS
  Filled 2013-09-28: qty 47

## 2013-09-28 MED ORDER — MORPHINE SULFATE ER 30 MG PO TBCR: 30.0000 mg | EXTENDED_RELEASE_TABLET | Freq: Two times a day (BID) | ORAL | Status: DC

## 2013-09-28 MED ORDER — DEXAMETHASONE SODIUM PHOSPHATE 20 MG/5ML IJ SOLN
INTRAMUSCULAR | Status: AC
  Filled 2013-09-28: qty 5

## 2013-09-28 MED ORDER — DIPHENHYDRAMINE HCL 50 MG/ML IJ SOLN
INTRAMUSCULAR | Status: AC
  Filled 2013-09-28: qty 1

## 2013-09-28 NOTE — Telephone Encounter (Signed)
Pharmacy has rx and filled one . They are going to order order MS contin.

## 2013-09-28 NOTE — Telephone Encounter (Signed)
gv adn printd appt sched and avs for pt for April adn May....sed added tx.

## 2013-09-28 NOTE — Progress Notes (Signed)
Patient complains of fatigue.  She is frustrated with her daughter, who lives with her.  She is followed by the Education officer, [employer].  She has difficulty eating and states her family does not understand.  Weight continues to decline and was documented as 125 pounds on April 13 decreased from 127.5 pounds March 16.  Patient does continue on Lasix.  She enjoys Ensure Plus but has difficulty affording oral nutrition supplements.  Nutrition diagnosis: Food and nutrition related knowledge deficit improved.  Intervention: Patient encouraged to continue smaller, more frequent meals and snacks throughout the day.  Recommended patient increase Ensure Plus to twice a day.  I provided one complementary case of Ensure Plus to take with her.  Questions were answered.  Teach back method used.  Monitoring, evaluation, goals: Patient will tolerate adequate calories and protein to maintain lean body mass.  Next visit: Tuesday, April 5, during chemotherapy.

## 2013-09-28 NOTE — Patient Instructions (Signed)
[facility] Discharge Instructions for Patients Receiving Chemotherapy  Today you received the following chemotherapy agents: Taxol/ Carboplatin  To help prevent nausea and vomiting after your treatment, we encourage you to take your nausea medication as needed.   If you develop nausea and vomiting that is not controlled by your nausea medication, call the clinic.   BELOW ARE SYMPTOMS THAT SHOULD BE REPORTED IMMEDIATELY:  *FEVER GREATER THAN 100.5 F  *CHILLS WITH OR WITHOUT FEVER  NAUSEA AND VOMITING THAT IS NOT CONTROLLED WITH YOUR NAUSEA MEDICATION  *UNUSUAL SHORTNESS OF BREATH  *UNUSUAL BRUISING OR BLEEDING  TENDERNESS IN MOUTH AND THROAT WITH OR WITHOUT PRESENCE OF ULCERS  *URINARY PROBLEMS  *BOWEL PROBLEMS  UNUSUAL RASH Items with * indicate a potential emergency and should be followed up as soon as possible.  Feel free to call the clinic you have any questions or concerns. The clinic phone number is [phone].    

## 2013-09-28 NOTE — Progress Notes (Signed)
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    CURRENT THERAPY: Systemic chemotherapy with carboplatin for AUC of 5 and paclitaxel 175 mg/M2 every 3 weeks with Neulasta support. Status post 1 cycle. 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   INTERVAL HISTORY: Stephanie Oliver 63 y.o. female returns for a scheduled regular symptom management visit for follow up accompanied by her caregiver. The patient is feeling fine today with no specific complaints except for mild fatigue. She denied having any significant chest pain but continues to have shortness breath with exertion. She denied having any fever or chills. She has no significant weight loss or night sweats. She tolerated the first cycle of her systemic chemotherapy with carboplatin and paclitaxel fairly well. She continues to  have low back pain and OxyIR is not controlling it well. She is here today to start cycle #2.  MEDICAL HISTORY: Past Medical History  Diagnosis Date  . Depression   . Panic attacks   . Hypertension   . Smoker   . Alcohol abuse   . Cancer     lung  . History of radiation therapy 07/28/13-08/25/13    rt lung,37.5Gy/22fx    ALLERGIES:  is allergic to paxil; lisinopril; and penicillins.  MEDICATIONS:  Current Outpatient Prescriptions  Medication Sig Dispense Refill  . ALPRAZolam (XANAX) 0.5 MG tablet Take 1 tablet (0.5 mg total) by mouth 2 (two) times daily as needed for anxiety.  30 tablet  0  . amiodarone (PACERONE) 200 MG tablet Take 1 tablet (200 mg total) by mouth daily.  30 tablet  1  . apixaban (ELIQUIS) 5 MG TABS tablet Take 1 tablet (5 mg total) by mouth 2 (two) times daily.  60 tablet  1  . diclofenac sodium (VOLTAREN) 1 % GEL Apply 4 g topically 4 (four) times daily as needed (for pain).      Marland Kitchen digoxin (LANOXIN) 0.125 MG tablet Take 1 tablet (0.125 mg total) by mouth daily.  30 tablet  1  . diltiazem (CARDIZEM CD) 180 MG 24 hr capsule Take 180 mg by mouth daily.      . diphenoxylate-atropine (LOMOTIL) 2.5-0.025 MG per tablet Take 1 tablet by mouth 4 (four) times daily as needed for diarrhea or loose stools.      . docusate sodium (COLACE) 100 MG capsule Take 1 capsule (100 mg total) by mouth 2 (two) times daily. Continue this  while taking narcotics to help with bowel movements  30 capsule  1  . feeding supplement, ENSURE COMPLETE, (ENSURE COMPLETE) LIQD Take 237 mLs by mouth 2 (two) times daily between meals.  60 Bottle  6  . folic acid (FOLVITE) 1 MG tablet Take 1 tablet (1 mg total) by mouth daily.  30 tablet  1  . furosemide (LASIX) 40 MG tablet Take 40 mg by mouth daily.      . iron polysaccharides (NIFEREX) 150 MG capsule Take 1 capsule (150 mg total) by mouth daily.  30 capsule  1  . levalbuterol (XOPENEX) 0.63 MG/3ML nebulizer solution Take 0.63 mg by nebulization every 4  (four) hours as needed for wheezing or shortness of breath.      . metoprolol tartrate (LOPRESSOR) 25 MG tablet Take 25 mg by mouth 2 (two) times daily.      . Multiple Vitamin (MULTIVITAMIN WITH MINERALS) TABS tablet Take 1 tablet by mouth daily.      Marland Kitchen oxyCODONE (OXY IR/ROXICODONE) 5 MG immediate release tablet Take 1 tablet (5 mg total) by mouth as needed for severe pain (1 tablet every 4-6 hours as needed).  30 tablet  0  . pantoprazole (PROTONIX) 40 MG tablet Take 1 tablet (40 mg total) by mouth daily.  30 tablet  1  . potassium chloride SA (K-DUR,KLOR-CON) 20 MEQ tablet Take 1 tablet (20 mEq total) by mouth daily.  60 tablet  1  . prochlorperazine (COMPAZINE) 10 MG tablet       . thiamine 100 MG tablet Take 1 tablet (100 mg total) by mouth daily.  30 tablet  1   No current facility-administered medications for this visit.    SURGICAL HISTORY:  Past Surgical History  Procedure Laterality Date  . Tonsillectomy    . Back surgery    . Femur im nail Right 06/22/2013    Procedure: RIGHT HIP GAMMA NAIL FIXATION   ;  Surgeon: Renette Butters, MD;  Location: [facility];  Service: Orthopedics;  Laterality: Right;  . Video bronchoscopy with endobronchial ultrasound N/A 06/22/2013    Procedure: VIDEO BRONCHOSCOPY WITH ENDOBRONCHIAL ULTRASOUND;  Surgeon: Doree Fudge, MD;  Location: [facility];  Service: Pulmonary;  Laterality: N/A;    REVIEW OF SYSTEMS:  Constitutional: positive for fatigue Eyes: negative Ears, nose, mouth, throat, and face: negative Respiratory: positive for cough and dyspnea on exertion Cardiovascular: negative Gastrointestinal: negative Genitourinary:negative Integument/breast: negative Hematologic/lymphatic: negative Musculoskeletal:positive for bone pain Neurological: negative Behavioral/Psych: positive for anxiety Endocrine: negative Allergic/Immunologic: negative   PHYSICAL EXAMINATION: General appearance: alert, cooperative, appears stated age and no  distress Head: Normocephalic, without obvious abnormality, atraumatic Neck: no adenopathy, no carotid bruit, no JVD, supple, symmetrical, trachea midline and thyroid not enlarged, symmetric, no tenderness/mass/nodules Lymph nodes: Cervical, supraclavicular, and axillary nodes normal. Resp: clear to auscultation bilaterally Cardio: regular rate and rhythm, S1, S2 normal, no murmur, click, rub or gallop GI: soft, non-tender; bowel sounds normal; no masses,  no organomegaly Extremities: Patient continues to have pain related to her right hip stabilization surgery on 06/22/2013 as well as some right lower extremity edema Neurologic: Grossly normal  ECOG PERFORMANCE STATUS: 1 - Symptomatic but completely ambulatory  Blood pressure 100/60, pulse 114, temperature 98.3 F (36.8 C), temperature source Oral, resp. rate 17, height 5\' 3"  (1.6 m), weight 124 lb 8 oz (56.473 kg), SpO2 97.00%.  LABORATORY DATA: Lab Results  Component Value Date   WBC 10.1 09/28/2013   HGB 10.0* 09/28/2013   HCT 30.4*  09/28/2013   MCV 89.7 09/28/2013   PLT 317 09/28/2013      Chemistry      Component Value Date/Time   NA 141 09/21/2013 1014   NA 131* 08/11/2013 1744   K 3.4* 09/21/2013 1014   K 4.3 08/11/2013 1744   CL 90* 08/11/2013 1744   CO2 30* 09/21/2013 1014   CO2 32 06/30/2013 1125   BUN 8.4 09/21/2013 1014   BUN 8 08/11/2013 1744   CREATININE 0.6 09/21/2013 1014   CREATININE 0.60 08/11/2013 1744      Component Value Date/Time   CALCIUM 9.3 09/21/2013 1014   CALCIUM 8.8 06/30/2013 1125   ALKPHOS 125 09/21/2013 1014   ALKPHOS 159* 06/30/2013 1125   AST 14 09/21/2013 1014   AST 31 06/30/2013 1125   ALT 14 09/21/2013 1014   ALT 43* 06/30/2013 1125   BILITOT 0.26 09/21/2013 1014   BILITOT 1.1 06/30/2013 1125       RADIOGRAPHIC STUDIES:  ASSESSMENT/PLAN:  This is a very pleasant 63 years old white female with recently diagnosed with stage IV non-small cell lung cancer with recent PET scan showing liver and bone metastasis.  She has a large right upper lobe lung mass with direct mediastinal invasion. The patient completed a course of concurrent chemoradiation to the right upper lobe lung mass and mediastinum for 3 weeks.  She is currently undergoing systemic chemotherapy with carboplatin and paclitaxel status post 1 cycle. She is tolerating her treatment fairly well with no significant adverse effects. For pain management I will start the patient on MS Contin 30 mg by mouth every 12 hours in addition to OxyIR for breakthrough pain. She would come back for followup visit in 3 weeks with the start of cycle #3 of her chemotherapy.  She was advised to call immediately if she has any concerning symptoms in the interval. I spent 15 minutes of face-to-face counseling with the patient and her family out of the total visit time 25 minutes.  Disclaimer: This note was dictated with voice recognition software. Similar sounding words can inadvertently be transcribed and may not be corrected upon review. Curt Bears, MD 09/28/2013

## 2013-09-29 ENCOUNTER — Ambulatory Visit (HOSPITAL_BASED_OUTPATIENT_CLINIC_OR_DEPARTMENT_OTHER)

## 2013-09-29 VITALS — BP 117/71 | HR 116 | Temp 97.8°F

## 2013-09-29 DIAGNOSIS — C3491 Malignant neoplasm of unspecified part of right bronchus or lung: Secondary | ICD-10-CM

## 2013-09-29 DIAGNOSIS — C7952 Secondary malignant neoplasm of bone marrow: Secondary | ICD-10-CM

## 2013-09-29 DIAGNOSIS — C787 Secondary malignant neoplasm of liver and intrahepatic bile duct: Secondary | ICD-10-CM

## 2013-09-29 DIAGNOSIS — Z5189 Encounter for other specified aftercare: Secondary | ICD-10-CM

## 2013-09-29 DIAGNOSIS — C7951 Secondary malignant neoplasm of bone: Secondary | ICD-10-CM

## 2013-09-29 DIAGNOSIS — C3410 Malignant neoplasm of upper lobe, unspecified bronchus or lung: Secondary | ICD-10-CM

## 2013-09-29 MED ORDER — PEGFILGRASTIM INJECTION 6 MG/0.6ML
6.0000 mg | Freq: Once | SUBCUTANEOUS | Status: AC
  Administered 2013-09-29: 6 mg via SUBCUTANEOUS
  Filled 2013-09-29: qty 0.6

## 2013-09-30 ENCOUNTER — Encounter: Payer: Self-pay | Admitting: *Deleted

## 2013-09-30 NOTE — Progress Notes (Signed)
Family called regarding pt appt.  I clarified appt.  She stated pt is living in poor living conditions. I gave her Ander Purpura and Abigail's phone number for help.

## 2013-10-05 ENCOUNTER — Other Ambulatory Visit (HOSPITAL_BASED_OUTPATIENT_CLINIC_OR_DEPARTMENT_OTHER)

## 2013-10-05 DIAGNOSIS — C3410 Malignant neoplasm of upper lobe, unspecified bronchus or lung: Secondary | ICD-10-CM

## 2013-10-05 DIAGNOSIS — C3490 Malignant neoplasm of unspecified part of unspecified bronchus or lung: Secondary | ICD-10-CM

## 2013-10-05 DIAGNOSIS — C787 Secondary malignant neoplasm of liver and intrahepatic bile duct: Secondary | ICD-10-CM

## 2013-10-05 LAB — CBC WITH DIFFERENTIAL/PLATELET
BASO%: 0.2 % (ref 0.0–2.0)
Basophils Absolute: 0 10*3/uL (ref 0.0–0.1)
EOS ABS: 0.2 10*3/uL (ref 0.0–0.5)
EOS%: 5.1 % (ref 0.0–7.0)
HCT: 28.7 % — ABNORMAL LOW (ref 34.8–46.6)
HGB: 9.4 g/dL — ABNORMAL LOW (ref 11.6–15.9)
LYMPH#: 0.8 10*3/uL — AB (ref 0.9–3.3)
LYMPH%: 19 % (ref 14.0–49.7)
MCH: 29.7 pg (ref 25.1–34.0)
MCHC: 32.8 g/dL (ref 31.5–36.0)
MCV: 90.5 fL (ref 79.5–101.0)
MONO#: 0.5 10*3/uL (ref 0.1–0.9)
MONO%: 12.1 % (ref 0.0–14.0)
NEUT%: 63.6 % (ref 38.4–76.8)
NEUTROS ABS: 2.6 10*3/uL (ref 1.5–6.5)
PLATELETS: 217 10*3/uL (ref 145–400)
RBC: 3.18 10*6/uL — AB (ref 3.70–5.45)
RDW: 18.7 % — AB (ref 11.2–14.5)
WBC: 4.1 10*3/uL (ref 3.9–10.3)

## 2013-10-05 LAB — COMPREHENSIVE METABOLIC PANEL (CC13)
ALBUMIN: 3.3 g/dL — AB (ref 3.5–5.0)
ALT: 18 U/L (ref 0–55)
ANION GAP: 13 meq/L — AB (ref 3–11)
AST: 18 U/L (ref 5–34)
Alkaline Phosphatase: 124 U/L (ref 40–150)
BUN: 9.7 mg/dL (ref 7.0–26.0)
CHLORIDE: 98 meq/L (ref 98–109)
CO2: 28 mEq/L (ref 22–29)
Calcium: 10 mg/dL (ref 8.4–10.4)
Creatinine: 0.7 mg/dL (ref 0.6–1.1)
GLUCOSE: 164 mg/dL — AB (ref 70–140)
POTASSIUM: 3.8 meq/L (ref 3.5–5.1)
SODIUM: 140 meq/L (ref 136–145)
Total Bilirubin: 0.31 mg/dL (ref 0.20–1.20)
Total Protein: 7 g/dL (ref 6.4–8.3)

## 2013-10-06 ENCOUNTER — Other Ambulatory Visit: Payer: Self-pay | Admitting: *Deleted

## 2013-10-06 DIAGNOSIS — C3491 Malignant neoplasm of unspecified part of right bronchus or lung: Secondary | ICD-10-CM

## 2013-10-06 MED ORDER — OXYCODONE HCL 5 MG PO TABS: 5.0000 mg | ORAL_TABLET | ORAL | Status: DC | PRN

## 2013-10-07 ENCOUNTER — Ambulatory Visit
Admission: RE | Admit: 2013-10-07 | Discharge: 2013-10-07 | Disposition: A | Discharge status: Home / Self Care | Payer: No Typology Code available for payment source | Source: Ambulatory Visit | Attending: Radiation Oncology | Admitting: Radiation Oncology

## 2013-10-07 VITALS — BP 114/81 | HR 108 | Temp 98.4°F | Resp 20 | Wt 124.7 lb

## 2013-10-07 DIAGNOSIS — C3410 Malignant neoplasm of upper lobe, unspecified bronchus or lung: Secondary | ICD-10-CM

## 2013-10-07 HISTORY — DX: Personal history of irradiation: Z92.3

## 2013-10-07 NOTE — Progress Notes (Signed)
Pt states she continues to have pain in her right hip since femur pinning, but it has improved. She denies other pain. She has SOB w/exertion, productive cough w/clear sputum, fatigue, loss of appetite. She drinks up to 3 cans Ensure daily.

## 2013-10-07 NOTE — Progress Notes (Signed)
Radiation Oncology         [phone] ________________________________  Name: Stephanie Oliver MRN: [id number]  Date: 10/07/2013  DOB: [birth year]  Follow-Up Visit Note  CC: [name], MD  Deatra Robinson, MD  Diagnosis:   Non cell lung cancer, stage IV  Interval Since Last Radiation:  One month   Narrative:  The patient returns today for routine follow-up.  The patient inflated with palliative chemoradiation treatment on 08/25/2013. The patient is continuing with palliative chemotherapy through Dr. Julien Nordmann. She has done relatively well with this. Her right hip has continued to bother since surgery but this is slowly improving. She relates ongoing shortness of breath, no major change in this. No significant chest pain currently.                              ALLERGIES:  is allergic to paxil; lisinopril; and penicillins.  Meds: Current Outpatient Prescriptions  Medication Sig Dispense Refill  . ALPRAZolam (XANAX) 0.5 MG tablet Take 1 tablet (0.5 mg total) by mouth 2 (two) times daily as needed for anxiety.  30 tablet  0  . amiodarone (PACERONE) 200 MG tablet Take 1 tablet (200 mg total) by mouth daily.  30 tablet  1  . apixaban (ELIQUIS) 5 MG TABS tablet Take 1 tablet (5 mg total) by mouth 2 (two) times daily.  60 tablet  1  . diclofenac sodium (VOLTAREN) 1 % GEL Apply 4 g topically 4 (four) times daily as needed (for pain).      Marland Kitchen digoxin (LANOXIN) 0.125 MG tablet Take 1 tablet (0.125 mg total) by mouth daily.  30 tablet  1  . diltiazem (CARDIZEM CD) 180 MG 24 hr capsule Take 180 mg by mouth daily.      . diphenoxylate-atropine (LOMOTIL) 2.5-0.025 MG per tablet Take 1 tablet by mouth 4 (four) times daily as needed for diarrhea or loose stools.      . docusate sodium (COLACE) 100 MG capsule Take 1 capsule (100 mg total) by mouth 2 (two) times daily. Continue this while taking narcotics to help with bowel movements  30 capsule  1  . feeding supplement, ENSURE COMPLETE, (ENSURE  COMPLETE) LIQD Take 237 mLs by mouth 2 (two) times daily between meals.  60 Bottle  6  . folic acid (FOLVITE) 1 MG tablet Take 1 tablet (1 mg total) by mouth daily.  30 tablet  1  . furosemide (LASIX) 40 MG tablet Take 40 mg by mouth daily.      . iron polysaccharides (NIFEREX) 150 MG capsule Take 1 capsule (150 mg total) by mouth daily.  30 capsule  1  . levalbuterol (XOPENEX) 0.63 MG/3ML nebulizer solution Take 0.63 mg by nebulization every 4 (four) hours as needed for wheezing or shortness of breath.      . metoprolol tartrate (LOPRESSOR) 25 MG tablet Take 25 mg by mouth 2 (two) times daily.      Marland Kitchen morphine (MS CONTIN) 30 MG 12 hr tablet Take 1 tablet (30 mg total) by mouth every 12 (twelve) hours.  60 tablet  0  . Multiple Vitamin (MULTIVITAMIN WITH MINERALS) TABS tablet Take 1 tablet by mouth daily.      Marland Kitchen oxyCODONE (OXY IR/ROXICODONE) 5 MG immediate release tablet Take 1 tablet (5 mg total) by mouth as needed for severe pain (1 tablet every 4-6 hours as needed).  40 tablet  0  . pantoprazole (PROTONIX) 40 MG tablet  Take 1 tablet (40 mg total) by mouth daily.  30 tablet  1  . potassium chloride SA (K-DUR,KLOR-CON) 20 MEQ tablet Take 1 tablet (20 mEq total) by mouth daily.  60 tablet  1  . prochlorperazine (COMPAZINE) 10 MG tablet       . thiamine 100 MG tablet Take 1 tablet (100 mg total) by mouth daily.  30 tablet  1   No current facility-administered medications for this encounter.    Physical Findings: The patient is in no acute distress. Patient is alert and oriented.  weight is 124 lb 11.2 oz (56.564 kg). Her oral temperature is 98.4 F (36.9 C). Her blood pressure is 114/81 and her pulse is 108. Her respiration is 20 and oxygen saturation is 99%. .   General: Well-developed, in no acute distress HEENT: Normocephalic, atraumatic Cardiovascular: Regular rate and rhythm Respiratory: Clear to auscultation bilaterally with decreased breath sounds in the right upper lung GI: Soft,  nontender, normal bowel sounds Extremities: No edema present   Lab Findings: Lab Results  Component Value Date   WBC 4.1 10/05/2013   HGB 9.4* 10/05/2013   HCT 28.7* 10/05/2013   MCV 90.5 10/05/2013   PLT 217 10/05/2013     Radiographic Findings: No results found.  Impression:    The patient is doing well at this time. No ongoing difficulties related to her radiation treatment. Continuing with palliative systemic treatment. The patient was accompanied by a health care worker today.  Plan:  The patient will return to clinic in 4 months for ongoing followup.   Jodelle Gross, M.D., Ph.D.

## 2013-10-12 ENCOUNTER — Other Ambulatory Visit (HOSPITAL_BASED_OUTPATIENT_CLINIC_OR_DEPARTMENT_OTHER)

## 2013-10-12 DIAGNOSIS — C3410 Malignant neoplasm of upper lobe, unspecified bronchus or lung: Secondary | ICD-10-CM

## 2013-10-12 DIAGNOSIS — C3490 Malignant neoplasm of unspecified part of unspecified bronchus or lung: Secondary | ICD-10-CM

## 2013-10-12 LAB — CBC WITH DIFFERENTIAL/PLATELET
BASO%: 0.1 % (ref 0.0–2.0)
Basophils Absolute: 0 10*3/uL (ref 0.0–0.1)
EOS%: 0.6 % (ref 0.0–7.0)
Eosinophils Absolute: 0.1 10*3/uL (ref 0.0–0.5)
HCT: 30.2 % — ABNORMAL LOW (ref 34.8–46.6)
HGB: 10 g/dL — ABNORMAL LOW (ref 11.6–15.9)
LYMPH#: 1 10*3/uL (ref 0.9–3.3)
LYMPH%: 8.1 % — AB (ref 14.0–49.7)
MCH: 30.2 pg (ref 25.1–34.0)
MCHC: 33.1 g/dL (ref 31.5–36.0)
MCV: 91.3 fL (ref 79.5–101.0)
MONO#: 1.3 10*3/uL — AB (ref 0.1–0.9)
MONO%: 10.1 % (ref 0.0–14.0)
NEUT#: 10.5 10*3/uL — ABNORMAL HIGH (ref 1.5–6.5)
NEUT%: 81.1 % — AB (ref 38.4–76.8)
Platelets: 221 10*3/uL (ref 145–400)
RBC: 3.31 10*6/uL — AB (ref 3.70–5.45)
RDW: 19.8 % — ABNORMAL HIGH (ref 11.2–14.5)
WBC: 13 10*3/uL — AB (ref 3.9–10.3)

## 2013-10-12 LAB — COMPREHENSIVE METABOLIC PANEL (CC13)
ALT: 13 U/L (ref 0–55)
AST: 17 U/L (ref 5–34)
Albumin: 3.3 g/dL — ABNORMAL LOW (ref 3.5–5.0)
Alkaline Phosphatase: 116 U/L (ref 40–150)
Anion Gap: 12 mEq/L — ABNORMAL HIGH (ref 3–11)
BILIRUBIN TOTAL: 0.36 mg/dL (ref 0.20–1.20)
BUN: 9.2 mg/dL (ref 7.0–26.0)
CO2: 28 meq/L (ref 22–29)
CREATININE: 0.7 mg/dL (ref 0.6–1.1)
Calcium: 9.9 mg/dL (ref 8.4–10.4)
Chloride: 101 mEq/L (ref 98–109)
Glucose: 118 mg/dl (ref 70–140)
Potassium: 3.4 mEq/L — ABNORMAL LOW (ref 3.5–5.1)
SODIUM: 141 meq/L (ref 136–145)
TOTAL PROTEIN: 7.2 g/dL (ref 6.4–8.3)

## 2013-10-13 ENCOUNTER — Telehealth: Payer: Self-pay | Admitting: Medical Oncology

## 2013-10-13 DIAGNOSIS — C3491 Malignant neoplasm of unspecified part of right bronchus or lung: Secondary | ICD-10-CM

## 2013-10-13 NOTE — Telephone Encounter (Signed)
[name] , staff from [facility],  called for pt stating pt needed refill. I called pt and she has 11 tablets oxycodone left and takes 2 /day. Her rx bottle she said  is dated 4/14. I todl Dolores she has a rx here to pick up dated for 10/06/13. She said she will get someone to pick up rx. She said she did not know it needed to be picked up.

## 2013-10-15 ENCOUNTER — Telehealth: Payer: Self-pay | Admitting: Medical Oncology

## 2013-10-15 NOTE — Telephone Encounter (Signed)
Alice McFarland called about procedure to pick up rx for pain med. Reviewed .

## 2013-10-19 ENCOUNTER — Other Ambulatory Visit

## 2013-10-19 ENCOUNTER — Ambulatory Visit: Admitting: Nutrition

## 2013-10-19 ENCOUNTER — Ambulatory Visit (HOSPITAL_BASED_OUTPATIENT_CLINIC_OR_DEPARTMENT_OTHER)

## 2013-10-19 ENCOUNTER — Encounter: Payer: Self-pay | Admitting: Internal Medicine

## 2013-10-19 ENCOUNTER — Ambulatory Visit (HOSPITAL_BASED_OUTPATIENT_CLINIC_OR_DEPARTMENT_OTHER): Admitting: Internal Medicine

## 2013-10-19 ENCOUNTER — Encounter: Payer: Self-pay | Admitting: *Deleted

## 2013-10-19 ENCOUNTER — Other Ambulatory Visit (HOSPITAL_BASED_OUTPATIENT_CLINIC_OR_DEPARTMENT_OTHER)

## 2013-10-19 ENCOUNTER — Telehealth: Payer: Self-pay | Admitting: Internal Medicine

## 2013-10-19 VITALS — BP 108/60 | HR 110 | Temp 98.3°F | Resp 17 | Ht 63.0 in | Wt 123.5 lb

## 2013-10-19 DIAGNOSIS — C3410 Malignant neoplasm of upper lobe, unspecified bronchus or lung: Secondary | ICD-10-CM

## 2013-10-19 DIAGNOSIS — C3490 Malignant neoplasm of unspecified part of unspecified bronchus or lung: Secondary | ICD-10-CM

## 2013-10-19 DIAGNOSIS — C7952 Secondary malignant neoplasm of bone marrow: Secondary | ICD-10-CM

## 2013-10-19 DIAGNOSIS — I10 Essential (primary) hypertension: Secondary | ICD-10-CM

## 2013-10-19 DIAGNOSIS — C7951 Secondary malignant neoplasm of bone: Secondary | ICD-10-CM

## 2013-10-19 DIAGNOSIS — C787 Secondary malignant neoplasm of liver and intrahepatic bile duct: Secondary | ICD-10-CM

## 2013-10-19 DIAGNOSIS — C3491 Malignant neoplasm of unspecified part of right bronchus or lung: Secondary | ICD-10-CM

## 2013-10-19 DIAGNOSIS — R5383 Other fatigue: Secondary | ICD-10-CM

## 2013-10-19 DIAGNOSIS — Z5111 Encounter for antineoplastic chemotherapy: Secondary | ICD-10-CM

## 2013-10-19 DIAGNOSIS — R5381 Other malaise: Secondary | ICD-10-CM

## 2013-10-19 LAB — COMPREHENSIVE METABOLIC PANEL (CC13)
ALT: 10 U/L (ref 0–55)
ANION GAP: 11 meq/L (ref 3–11)
AST: 11 U/L (ref 5–34)
Albumin: 3.1 g/dL — ABNORMAL LOW (ref 3.5–5.0)
Alkaline Phosphatase: 100 U/L (ref 40–150)
BUN: 12.2 mg/dL (ref 7.0–26.0)
CALCIUM: 9.8 mg/dL (ref 8.4–10.4)
CHLORIDE: 101 meq/L (ref 98–109)
CO2: 23 meq/L (ref 22–29)
CREATININE: 0.7 mg/dL (ref 0.6–1.1)
Glucose: 149 mg/dl — ABNORMAL HIGH (ref 70–140)
Potassium: 4 mEq/L (ref 3.5–5.1)
Sodium: 135 mEq/L — ABNORMAL LOW (ref 136–145)
Total Bilirubin: 0.62 mg/dL (ref 0.20–1.20)
Total Protein: 6.9 g/dL (ref 6.4–8.3)

## 2013-10-19 LAB — CBC WITH DIFFERENTIAL/PLATELET
BASO%: 0.1 % (ref 0.0–2.0)
BASOS ABS: 0 10*3/uL (ref 0.0–0.1)
EOS%: 0.3 % (ref 0.0–7.0)
Eosinophils Absolute: 0 10*3/uL (ref 0.0–0.5)
HEMATOCRIT: 27.7 % — AB (ref 34.8–46.6)
HGB: 9.2 g/dL — ABNORMAL LOW (ref 11.6–15.9)
LYMPH#: 0.6 10*3/uL — AB (ref 0.9–3.3)
LYMPH%: 4.3 % — ABNORMAL LOW (ref 14.0–49.7)
MCH: 30.7 pg (ref 25.1–34.0)
MCHC: 33.1 g/dL (ref 31.5–36.0)
MCV: 92.6 fL (ref 79.5–101.0)
MONO#: 2.2 10*3/uL — AB (ref 0.1–0.9)
MONO%: 15.7 % — ABNORMAL HIGH (ref 0.0–14.0)
NEUT#: 11 10*3/uL — ABNORMAL HIGH (ref 1.5–6.5)
NEUT%: 79.6 % — ABNORMAL HIGH (ref 38.4–76.8)
Platelets: 212 10*3/uL (ref 145–400)
RBC: 2.99 10*6/uL — ABNORMAL LOW (ref 3.70–5.45)
RDW: 21.7 % — ABNORMAL HIGH (ref 11.2–14.5)
WBC: 13.9 10*3/uL — AB (ref 3.9–10.3)

## 2013-10-19 MED ORDER — PACLITAXEL CHEMO INJECTION 300 MG/50ML
175.0000 mg/m2 | Freq: Once | INTRAVENOUS | Status: AC
  Administered 2013-10-19: 282 mg via INTRAVENOUS
  Filled 2013-10-19: qty 47

## 2013-10-19 MED ORDER — ONDANSETRON 16 MG/50ML IVPB (CHCC)
16.0000 mg | Freq: Once | INTRAVENOUS | Status: AC
  Administered 2013-10-19: 16 mg via INTRAVENOUS

## 2013-10-19 MED ORDER — DIPHENHYDRAMINE HCL 50 MG/ML IJ SOLN
INTRAMUSCULAR | Status: AC
  Filled 2013-10-19: qty 1

## 2013-10-19 MED ORDER — SODIUM CHLORIDE 0.9 % IV SOLN
449.0000 mg | Freq: Once | INTRAVENOUS | Status: AC
  Administered 2013-10-19: 450 mg via INTRAVENOUS
  Filled 2013-10-19: qty 45

## 2013-10-19 MED ORDER — DEXAMETHASONE SODIUM PHOSPHATE 20 MG/5ML IJ SOLN
20.0000 mg | Freq: Once | INTRAMUSCULAR | Status: AC
  Administered 2013-10-19: 20 mg via INTRAVENOUS

## 2013-10-19 MED ORDER — FAMOTIDINE IN NACL 20-0.9 MG/50ML-% IV SOLN
INTRAVENOUS | Status: AC
  Filled 2013-10-19: qty 50

## 2013-10-19 MED ORDER — FAMOTIDINE IN NACL 20-0.9 MG/50ML-% IV SOLN
20.0000 mg | Freq: Once | INTRAVENOUS | Status: AC
Start: 2013-10-19 — End: 2013-10-19
  Administered 2013-10-19: 20 mg via INTRAVENOUS

## 2013-10-19 MED ORDER — DIPHENHYDRAMINE HCL 50 MG/ML IJ SOLN
50.0000 mg | Freq: Once | INTRAMUSCULAR | Status: AC
  Administered 2013-10-19: 50 mg via INTRAVENOUS

## 2013-10-19 MED ORDER — SODIUM CHLORIDE 0.9 % IV SOLN
Freq: Once | INTRAVENOUS | Status: AC
  Administered 2013-10-19: 11:00:00 via INTRAVENOUS

## 2013-10-19 MED ORDER — DEXAMETHASONE SODIUM PHOSPHATE 20 MG/5ML IJ SOLN
INTRAMUSCULAR | Status: AC
  Filled 2013-10-19: qty 5

## 2013-10-19 MED ORDER — ONDANSETRON 16 MG/50ML IVPB (CHCC)
INTRAVENOUS | Status: AC
  Filled 2013-10-19: qty 16

## 2013-10-19 NOTE — Progress Notes (Signed)
Patient receiving systemic chemotherapy for non-small cell lung cancer.  Weight continues to decrease with current weight documented as 123.5 pounds on May 5 down from 124.7 pounds April 23.  Patient continues to have frustration with her family.  She is followed by the Education officer, [employer].  Patient describes taste alterations.  Bowels are moving okay.  She drinks 2 boost plus daily.  Nutrition diagnosis: Food and nutrition related knowledge deficit continues.  Intervention: Patient educated on strategies for improving taste alterations.  Reinforced importance of strategies for nausea.  Encouraged patient to take medications as prescribed.  Recommended patient increase boost +3 times a day between meals.  Provided coupons and fact sheets.  Teach back method used.  Monitoring, evaluation, goals: Patient is tolerating oral nutrition supplements but has had continued weight loss.  Patient will increase boost +3 times a day between meals.  Goal is weight maintenance.  Next visit: Tuesday, May 26, during chemotherapy.

## 2013-10-19 NOTE — Telephone Encounter (Signed)
Gave pt appt for lab and ML for 5/26, pt to see AJ per Dr Julien Nordmann, emailed michelle regarding chemo

## 2013-10-19 NOTE — Progress Notes (Signed)
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    CURRENT THERAPY: Systemic chemotherapy with carboplatin for AUC of 5 and paclitaxel 175 mg/M2 every 3 weeks with Neulasta support. Status post 2 cycle. 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   INTERVAL HISTORY: Stephanie Oliver 63 y.o. female returns for a scheduled regular symptom management visit for follow up accompanied by her caregiver. The patient is feeling fine today with no specific complaints except for mild fatigue and shortness breath with exertion. She denied having any significant chest pain, cough or hemoptysis. She denied having any fever or chills. She has no significant weight loss or night sweats. She tolerated the second cycle of her systemic chemotherapy with carboplatin and paclitaxel much better. She took  Claritin daily after the Neulasta injection and it did improve her symptom.  MEDICAL HISTORY: Past Medical History  Diagnosis Date  . Depression   . Panic attacks   . Hypertension   . Smoker   . Alcohol abuse   . Cancer     lung  . History of radiation therapy 07/28/13-08/25/13    rt lung,37.5Gy/28fx    ALLERGIES:  is allergic to paxil; lisinopril; and penicillins.  MEDICATIONS:  Current Outpatient Prescriptions  Medication Sig Dispense Refill  . ALPRAZolam (XANAX) 0.5 MG tablet Take 1 tablet (0.5 mg total) by mouth 2 (two) times daily as needed for anxiety.  30 tablet  0  . amiodarone (PACERONE) 200 MG tablet Take 1 tablet (200 mg total) by mouth daily.  30 tablet  1  . apixaban (ELIQUIS) 5 MG TABS tablet Take 1 tablet (5 mg total) by mouth 2 (two) times daily.  60 tablet  1  . diclofenac sodium (VOLTAREN) 1 % GEL Apply 4 g topically 4 (four) times daily as needed (for pain).      Marland Kitchen digoxin (LANOXIN) 0.125 MG tablet Take 1 tablet (0.125 mg total) by mouth daily.  30 tablet  1  . diltiazem (CARDIZEM CD) 180 MG 24 hr capsule Take 180 mg by mouth daily.      . diphenoxylate-atropine (LOMOTIL) 2.5-0.025 MG per tablet Take 1 tablet by mouth 4 (four) times daily as needed for diarrhea or loose stools.      . docusate sodium (COLACE) 100 MG capsule Take 1 capsule (100 mg total) by mouth 2 (two) times daily. Continue this while taking narcotics to help with bowel movements  30 capsule  1  . feeding supplement, ENSURE COMPLETE, (ENSURE COMPLETE) LIQD Take 237 mLs by mouth 2 (two) times daily between meals.  60 Bottle  6  . folic acid (FOLVITE) 1 MG tablet Take 1 tablet (1 mg total) by mouth daily.  30 tablet  1  . furosemide (LASIX) 40 MG tablet Take 40 mg by mouth daily.      . iron polysaccharides (NIFEREX) 150 MG capsule Take 1 capsule (150 mg total) by mouth daily.  30 capsule  1  . levalbuterol (XOPENEX) 0.63 MG/3ML nebulizer solution Take 0.63 mg by nebulization every 4 (four) hours as  needed for wheezing or shortness of breath.      . metoprolol tartrate (LOPRESSOR) 25 MG tablet Take 25 mg by mouth 2 (two) times daily.      Marland Kitchen morphine (MS CONTIN) 30 MG 12 hr tablet Take 1 tablet (30 mg total) by mouth every 12 (twelve) hours.  60 tablet  0  . Multiple Vitamin (MULTIVITAMIN WITH MINERALS) TABS tablet Take 1 tablet by mouth daily.      Marland Kitchen oxyCODONE (OXY IR/ROXICODONE) 5 MG immediate release tablet Take 1 tablet (5 mg total) by mouth as needed for severe pain (1 tablet every 4-6 hours as needed).  40 tablet  0  . pantoprazole (PROTONIX) 40 MG tablet Take 1 tablet (40 mg total) by mouth daily.  30 tablet  1  . potassium chloride SA (K-DUR,KLOR-CON) 20 MEQ tablet Take 1 tablet (20 mEq total) by mouth daily.  60 tablet  1  . prochlorperazine (COMPAZINE) 10 MG tablet       . thiamine 100 MG tablet Take 1 tablet (100 mg total) by mouth daily.  30 tablet  1   No current facility-administered medications for this visit.    SURGICAL HISTORY:  Past Surgical History  Procedure Laterality Date  . Tonsillectomy    . Back surgery    . Femur im nail Right 06/22/2013    Procedure: RIGHT HIP GAMMA NAIL FIXATION   ;  Surgeon: Renette Butters, MD;  Location: [facility];  Service: Orthopedics;  Laterality: Right;  . Video bronchoscopy with endobronchial ultrasound N/A 06/22/2013    Procedure: VIDEO BRONCHOSCOPY WITH ENDOBRONCHIAL ULTRASOUND;  Surgeon: Doree Fudge, MD;  Location: [facility];  Service: Pulmonary;  Laterality: N/A;    REVIEW OF SYSTEMS:  Constitutional: positive for fatigue Eyes: negative Ears, nose, mouth, throat, and face: negative Respiratory: positive for cough and dyspnea on exertion Cardiovascular: negative Gastrointestinal: negative Genitourinary:negative Integument/breast: negative Hematologic/lymphatic: negative Musculoskeletal:positive for bone pain Neurological: negative Behavioral/Psych: positive for anxiety Endocrine: negative Allergic/Immunologic:  negative   PHYSICAL EXAMINATION: General appearance: alert, cooperative, appears stated age and no distress Head: Normocephalic, without obvious abnormality, atraumatic Neck: no adenopathy, no carotid bruit, no JVD, supple, symmetrical, trachea midline and thyroid not enlarged, symmetric, no tenderness/mass/nodules Lymph nodes: Cervical, supraclavicular, and axillary nodes normal. Resp: clear to auscultation bilaterally Cardio: regular rate and rhythm, S1, S2 normal, no murmur, click, rub or gallop GI: soft, non-tender; bowel sounds normal; no masses,  no organomegaly Extremities: Patient continues to have pain related to her right hip stabilization surgery on 06/22/2013 as well as some right lower extremity edema Neurologic: Grossly normal  ECOG PERFORMANCE STATUS: 1 - Symptomatic but completely ambulatory  Blood pressure 108/60, pulse 110, temperature 98.3 F (36.8 C), temperature source Oral, resp. rate 17, height 5\' 3"  (1.6 m), weight 123 lb 8 oz (56.019 kg), SpO2 99.00%.  LABORATORY DATA: Lab Results  Component Value Date   WBC 13.9* 10/19/2013   HGB 9.2* 10/19/2013   HCT 27.7* 10/19/2013   MCV 92.6 10/19/2013   PLT 212 10/19/2013      Chemistry      Component Value Date/Time   NA 135* 10/19/2013 0927   NA 131* 08/11/2013 1744   K 4.0 10/19/2013 0927   K 4.3 08/11/2013 1744   CL 90* 08/11/2013 1744   CO2 23 10/19/2013 0927   CO2 32 06/30/2013 1125   BUN 12.2 10/19/2013 0927   BUN 8 08/11/2013 1744   CREATININE 0.7 10/19/2013 0927   CREATININE 0.60 08/11/2013 1744      Component Value Date/Time   CALCIUM 9.8 10/19/2013 0927   CALCIUM 8.8 06/30/2013 1125   ALKPHOS 100 10/19/2013 0927   ALKPHOS 159* 06/30/2013 1125   AST 11 10/19/2013 0927   AST 31 06/30/2013 1125   ALT 10 10/19/2013 0927   ALT 43* 06/30/2013 1125   BILITOT 0.62 10/19/2013 0927   BILITOT 1.1 06/30/2013 1125       RADIOGRAPHIC STUDIES:  ASSESSMENT/PLAN:  This is a very pleasant 63 years old white female with recently diagnosed  with stage IV non-small cell lung cancer with recent PET scan showing liver and bone metastasis. She has a large right upper lobe lung mass with direct mediastinal invasion. The patient completed a course of concurrent chemoradiation to the right upper lobe lung mass and mediastinum for 3 weeks.  She is currently undergoing systemic chemotherapy with carboplatin and paclitaxel status post 2 cycles. She is tolerating her treatment fairly well with no significant adverse effects. For pain management the patient will continue on MS Contin 30 mg by mouth every 12 hours in addition to OxyIR for breakthrough pain. She would come back for followup visit in 3 weeks with the start of cycle #4 of her chemotherapy after repeating CT scan of the chest, abdomen and pelvis for restaging of her disease. She was advised to call immediately if she has any concerning symptoms in the interval.  Disclaimer: This note was dictated with voice recognition software. Similar sounding words can inadvertently be transcribed and may not be corrected upon review. Curt Bears, MD 10/19/2013

## 2013-10-19 NOTE — Patient Instructions (Signed)
[facility] Discharge Instructions for Patients Receiving Chemotherapy  Today you received the following chemotherapy agents: Taxol and Carboplatin.  To help prevent nausea and vomiting after your treatment, we encourage you to take your nausea medication as prescribed.   If you develop nausea and vomiting that is not controlled by your nausea medication, call the clinic.   BELOW ARE SYMPTOMS THAT SHOULD BE REPORTED IMMEDIATELY:  *FEVER GREATER THAN 100.5 F  *CHILLS WITH OR WITHOUT FEVER  NAUSEA AND VOMITING THAT IS NOT CONTROLLED WITH YOUR NAUSEA MEDICATION  *UNUSUAL SHORTNESS OF BREATH  *UNUSUAL BRUISING OR BLEEDING  TENDERNESS IN MOUTH AND THROAT WITH OR WITHOUT PRESENCE OF ULCERS  *URINARY PROBLEMS  *BOWEL PROBLEMS  UNUSUAL RASH Items with * indicate a potential emergency and should be followed up as soon as possible.  Feel free to call the clinic you have any questions or concerns. The clinic phone number is [phone].    

## 2013-10-19 NOTE — Progress Notes (Signed)
Received referral from dietitian to assess/support patient in infusion room today.  Dietitian reports patient expressed family conflict and appeared tearful at visit.  CSW attempted to meet with patient- patient stated she was not interested in meeting with CSW.  Polo Riley, MSW, LCSW, OSW-C Clinical Social Worker [facility] [phone]

## 2013-10-20 ENCOUNTER — Telehealth: Payer: Self-pay | Admitting: *Deleted

## 2013-10-20 ENCOUNTER — Ambulatory Visit (HOSPITAL_BASED_OUTPATIENT_CLINIC_OR_DEPARTMENT_OTHER)

## 2013-10-20 VITALS — BP 108/65 | HR 114 | Temp 98.3°F

## 2013-10-20 DIAGNOSIS — Z5189 Encounter for other specified aftercare: Secondary | ICD-10-CM

## 2013-10-20 DIAGNOSIS — C3491 Malignant neoplasm of unspecified part of right bronchus or lung: Secondary | ICD-10-CM

## 2013-10-20 DIAGNOSIS — C787 Secondary malignant neoplasm of liver and intrahepatic bile duct: Secondary | ICD-10-CM

## 2013-10-20 DIAGNOSIS — C3410 Malignant neoplasm of upper lobe, unspecified bronchus or lung: Secondary | ICD-10-CM

## 2013-10-20 MED ORDER — PEGFILGRASTIM INJECTION 6 MG/0.6ML
6.0000 mg | Freq: Once | SUBCUTANEOUS | Status: AC
  Administered 2013-10-20: 6 mg via SUBCUTANEOUS
  Filled 2013-10-20: qty 0.6

## 2013-10-20 NOTE — Telephone Encounter (Signed)
Per staff message and POF I have scheduled appts.  JMW  

## 2013-10-25 ENCOUNTER — Telehealth: Payer: Self-pay | Admitting: Internal Medicine

## 2013-10-25 NOTE — Telephone Encounter (Signed)
Talked to ptand she is aware of all May appt

## 2013-10-26 ENCOUNTER — Other Ambulatory Visit (HOSPITAL_BASED_OUTPATIENT_CLINIC_OR_DEPARTMENT_OTHER)

## 2013-10-26 DIAGNOSIS — C3410 Malignant neoplasm of upper lobe, unspecified bronchus or lung: Secondary | ICD-10-CM

## 2013-10-26 DIAGNOSIS — C3490 Malignant neoplasm of unspecified part of unspecified bronchus or lung: Secondary | ICD-10-CM

## 2013-10-26 LAB — CBC WITH DIFFERENTIAL/PLATELET
BASO%: 0.4 % (ref 0.0–2.0)
Basophils Absolute: 0 10*3/uL (ref 0.0–0.1)
EOS%: 3 % (ref 0.0–7.0)
Eosinophils Absolute: 0.1 10*3/uL (ref 0.0–0.5)
HEMATOCRIT: 25.4 % — AB (ref 34.8–46.6)
HGB: 8.4 g/dL — ABNORMAL LOW (ref 11.6–15.9)
LYMPH#: 0.6 10*3/uL — AB (ref 0.9–3.3)
LYMPH%: 23.2 % (ref 14.0–49.7)
MCH: 31 pg (ref 25.1–34.0)
MCHC: 33.1 g/dL (ref 31.5–36.0)
MCV: 93.7 fL (ref 79.5–101.0)
MONO#: 0.1 10*3/uL (ref 0.1–0.9)
MONO%: 5.3 % (ref 0.0–14.0)
NEUT#: 1.7 10*3/uL (ref 1.5–6.5)
NEUT%: 68.1 % (ref 38.4–76.8)
Platelets: 107 10*3/uL — ABNORMAL LOW (ref 145–400)
RBC: 2.71 10*6/uL — AB (ref 3.70–5.45)
RDW: 20.7 % — AB (ref 11.2–14.5)
WBC: 2.5 10*3/uL — AB (ref 3.9–10.3)

## 2013-10-26 LAB — COMPREHENSIVE METABOLIC PANEL (CC13)
ALT: 23 U/L (ref 0–55)
AST: 16 U/L (ref 5–34)
Albumin: 3.3 g/dL — ABNORMAL LOW (ref 3.5–5.0)
Alkaline Phosphatase: 125 U/L (ref 40–150)
Anion Gap: 10 meq/L (ref 3–11)
BUN: 15.5 mg/dL (ref 7.0–26.0)
CO2: 24 meq/L (ref 22–29)
Calcium: 9.8 mg/dL (ref 8.4–10.4)
Chloride: 103 meq/L (ref 98–109)
Creatinine: 0.7 mg/dL (ref 0.6–1.1)
Glucose: 123 mg/dL (ref 70–140)
Potassium: 4.2 meq/L (ref 3.5–5.1)
Sodium: 137 meq/L (ref 136–145)
Total Bilirubin: 0.58 mg/dL (ref 0.20–1.20)
Total Protein: 6.5 g/dL (ref 6.4–8.3)

## 2013-11-02 ENCOUNTER — Encounter (HOSPITAL_COMMUNITY): Payer: Self-pay

## 2013-11-02 ENCOUNTER — Other Ambulatory Visit (HOSPITAL_BASED_OUTPATIENT_CLINIC_OR_DEPARTMENT_OTHER)

## 2013-11-02 ENCOUNTER — Ambulatory Visit (HOSPITAL_COMMUNITY)
Admission: RE | Admit: 2013-11-02 | Discharge: 2013-11-02 | Disposition: A | Discharge status: Home / Self Care | Source: Ambulatory Visit | Attending: Internal Medicine | Admitting: Internal Medicine

## 2013-11-02 DIAGNOSIS — Z923 Personal history of irradiation: Secondary | ICD-10-CM | POA: Insufficient documentation

## 2013-11-02 DIAGNOSIS — C3491 Malignant neoplasm of unspecified part of right bronchus or lung: Secondary | ICD-10-CM

## 2013-11-02 DIAGNOSIS — R222 Localized swelling, mass and lump, trunk: Secondary | ICD-10-CM | POA: Insufficient documentation

## 2013-11-02 DIAGNOSIS — C787 Secondary malignant neoplasm of liver and intrahepatic bile duct: Secondary | ICD-10-CM | POA: Insufficient documentation

## 2013-11-02 DIAGNOSIS — Z9221 Personal history of antineoplastic chemotherapy: Secondary | ICD-10-CM | POA: Insufficient documentation

## 2013-11-02 DIAGNOSIS — C3490 Malignant neoplasm of unspecified part of unspecified bronchus or lung: Secondary | ICD-10-CM | POA: Insufficient documentation

## 2013-11-02 DIAGNOSIS — R911 Solitary pulmonary nodule: Secondary | ICD-10-CM | POA: Insufficient documentation

## 2013-11-02 DIAGNOSIS — C3410 Malignant neoplasm of upper lobe, unspecified bronchus or lung: Secondary | ICD-10-CM

## 2013-11-02 LAB — COMPREHENSIVE METABOLIC PANEL (CC13)
ALT: 15 U/L (ref 0–55)
ANION GAP: 13 meq/L — AB (ref 3–11)
AST: 14 U/L (ref 5–34)
Albumin: 3.5 g/dL (ref 3.5–5.0)
Alkaline Phosphatase: 127 U/L (ref 40–150)
BILIRUBIN TOTAL: 0.42 mg/dL (ref 0.20–1.20)
BUN: 9 mg/dL (ref 7.0–26.0)
CALCIUM: 9.9 mg/dL (ref 8.4–10.4)
CO2: 26 meq/L (ref 22–29)
CREATININE: 0.7 mg/dL (ref 0.6–1.1)
Chloride: 100 mEq/L (ref 98–109)
Glucose: 105 mg/dl (ref 70–140)
Potassium: 3.9 mEq/L (ref 3.5–5.1)
Sodium: 140 mEq/L (ref 136–145)
Total Protein: 7.1 g/dL (ref 6.4–8.3)

## 2013-11-02 LAB — CBC WITH DIFFERENTIAL/PLATELET
BASO%: 0.1 % (ref 0.0–2.0)
BASOS ABS: 0 10*3/uL (ref 0.0–0.1)
EOS%: 0.7 % (ref 0.0–7.0)
Eosinophils Absolute: 0.1 10*3/uL (ref 0.0–0.5)
HEMATOCRIT: 30.5 % — AB (ref 34.8–46.6)
HEMOGLOBIN: 9.8 g/dL — AB (ref 11.6–15.9)
LYMPH%: 10.5 % — AB (ref 14.0–49.7)
MCH: 30.3 pg (ref 25.1–34.0)
MCHC: 32.1 g/dL (ref 31.5–36.0)
MCV: 94.4 fL (ref 79.5–101.0)
MONO#: 1.3 10*3/uL — AB (ref 0.1–0.9)
MONO%: 13.8 % (ref 0.0–14.0)
NEUT#: 6.8 10*3/uL — ABNORMAL HIGH (ref 1.5–6.5)
NEUT%: 74.9 % (ref 38.4–76.8)
PLATELETS: 146 10*3/uL (ref 145–400)
RBC: 3.23 10*6/uL — ABNORMAL LOW (ref 3.70–5.45)
RDW: 19.3 % — ABNORMAL HIGH (ref 11.2–14.5)
WBC: 9.1 10*3/uL (ref 3.9–10.3)
lymph#: 1 10*3/uL (ref 0.9–3.3)

## 2013-11-02 MED ORDER — IOHEXOL 300 MG/ML  SOLN
100.0000 mL | Freq: Once | INTRAMUSCULAR | Status: AC | PRN
  Administered 2013-11-02: 100 mL via INTRAVENOUS

## 2013-11-04 ENCOUNTER — Other Ambulatory Visit: Payer: Self-pay | Admitting: *Deleted

## 2013-11-04 DIAGNOSIS — C3491 Malignant neoplasm of unspecified part of right bronchus or lung: Secondary | ICD-10-CM

## 2013-11-04 MED ORDER — OXYCODONE HCL 5 MG PO TABS: 5.0000 mg | ORAL_TABLET | ORAL | Status: DC | PRN

## 2013-11-04 NOTE — Telephone Encounter (Signed)
Requesting refill for oxycodone.  SLJ

## 2013-11-09 ENCOUNTER — Ambulatory Visit (HOSPITAL_BASED_OUTPATIENT_CLINIC_OR_DEPARTMENT_OTHER): Admitting: Physician Assistant

## 2013-11-09 ENCOUNTER — Other Ambulatory Visit: Payer: Self-pay

## 2013-11-09 ENCOUNTER — Ambulatory Visit (HOSPITAL_BASED_OUTPATIENT_CLINIC_OR_DEPARTMENT_OTHER)

## 2013-11-09 ENCOUNTER — Ambulatory Visit

## 2013-11-09 ENCOUNTER — Ambulatory Visit: Admitting: Nutrition

## 2013-11-09 ENCOUNTER — Other Ambulatory Visit (HOSPITAL_BASED_OUTPATIENT_CLINIC_OR_DEPARTMENT_OTHER)

## 2013-11-09 ENCOUNTER — Encounter: Payer: Self-pay | Admitting: Physician Assistant

## 2013-11-09 VITALS — BP 87/44 | HR 102 | Temp 98.7°F | Resp 18 | Ht 63.0 in | Wt 119.8 lb

## 2013-11-09 DIAGNOSIS — C7951 Secondary malignant neoplasm of bone: Secondary | ICD-10-CM

## 2013-11-09 DIAGNOSIS — C3491 Malignant neoplasm of unspecified part of right bronchus or lung: Secondary | ICD-10-CM

## 2013-11-09 DIAGNOSIS — R0609 Other forms of dyspnea: Secondary | ICD-10-CM

## 2013-11-09 DIAGNOSIS — R5383 Other fatigue: Secondary | ICD-10-CM

## 2013-11-09 DIAGNOSIS — R5381 Other malaise: Secondary | ICD-10-CM

## 2013-11-09 DIAGNOSIS — C3410 Malignant neoplasm of upper lobe, unspecified bronchus or lung: Secondary | ICD-10-CM

## 2013-11-09 DIAGNOSIS — C7952 Secondary malignant neoplasm of bone marrow: Secondary | ICD-10-CM

## 2013-11-09 DIAGNOSIS — R0989 Other specified symptoms and signs involving the circulatory and respiratory systems: Secondary | ICD-10-CM

## 2013-11-09 DIAGNOSIS — Z5111 Encounter for antineoplastic chemotherapy: Secondary | ICD-10-CM

## 2013-11-09 DIAGNOSIS — C787 Secondary malignant neoplasm of liver and intrahepatic bile duct: Secondary | ICD-10-CM

## 2013-11-09 LAB — COMPREHENSIVE METABOLIC PANEL (CC13)
ALT: 12 U/L (ref 0–55)
ANION GAP: 14 meq/L — AB (ref 3–11)
AST: 12 U/L (ref 5–34)
Albumin: 3.3 g/dL — ABNORMAL LOW (ref 3.5–5.0)
Alkaline Phosphatase: 101 U/L (ref 40–150)
BUN: 13.1 mg/dL (ref 7.0–26.0)
CALCIUM: 10.3 mg/dL (ref 8.4–10.4)
CHLORIDE: 99 meq/L (ref 98–109)
CO2: 25 mEq/L (ref 22–29)
Creatinine: 0.7 mg/dL (ref 0.6–1.1)
Glucose: 137 mg/dl (ref 70–140)
Potassium: 3.7 mEq/L (ref 3.5–5.1)
Sodium: 137 mEq/L (ref 136–145)
TOTAL PROTEIN: 7.4 g/dL (ref 6.4–8.3)
Total Bilirubin: 0.7 mg/dL (ref 0.20–1.20)

## 2013-11-09 LAB — CBC WITH DIFFERENTIAL/PLATELET
BASO%: 0.5 % (ref 0.0–2.0)
BASOS ABS: 0.1 10*3/uL (ref 0.0–0.1)
EOS%: 0.1 % (ref 0.0–7.0)
Eosinophils Absolute: 0 10*3/uL (ref 0.0–0.5)
HEMATOCRIT: 30 % — AB (ref 34.8–46.6)
HGB: 9.9 g/dL — ABNORMAL LOW (ref 11.6–15.9)
LYMPH#: 0.7 10*3/uL — AB (ref 0.9–3.3)
LYMPH%: 5.4 % — ABNORMAL LOW (ref 14.0–49.7)
MCH: 31.9 pg (ref 25.1–34.0)
MCHC: 33.1 g/dL (ref 31.5–36.0)
MCV: 96.3 fL (ref 79.5–101.0)
MONO#: 1.6 10*3/uL — ABNORMAL HIGH (ref 0.1–0.9)
MONO%: 12.4 % (ref 0.0–14.0)
NEUT#: 10.3 10*3/uL — ABNORMAL HIGH (ref 1.5–6.5)
NEUT%: 81.6 % — ABNORMAL HIGH (ref 38.4–76.8)
Platelets: 226 10*3/uL (ref 145–400)
RBC: 3.12 10*6/uL — ABNORMAL LOW (ref 3.70–5.45)
RDW: 20.2 % — ABNORMAL HIGH (ref 11.2–14.5)
WBC: 12.6 10*3/uL — AB (ref 3.9–10.3)

## 2013-11-09 MED ORDER — ONDANSETRON 16 MG/50ML IVPB (CHCC)
16.0000 mg | Freq: Once | INTRAVENOUS | Status: AC
  Administered 2013-11-09: 16 mg via INTRAVENOUS

## 2013-11-09 MED ORDER — SODIUM CHLORIDE 0.9 % IV SOLN
Freq: Once | INTRAVENOUS | Status: AC
  Administered 2013-11-09: 11:00:00 via INTRAVENOUS

## 2013-11-09 MED ORDER — SODIUM CHLORIDE 0.9 % IV SOLN
449.0000 mg | Freq: Once | INTRAVENOUS | Status: AC
  Administered 2013-11-09: 450 mg via INTRAVENOUS
  Filled 2013-11-09: qty 45

## 2013-11-09 MED ORDER — DEXAMETHASONE SODIUM PHOSPHATE 20 MG/5ML IJ SOLN
20.0000 mg | Freq: Once | INTRAMUSCULAR | Status: AC
  Administered 2013-11-09: 20 mg via INTRAVENOUS

## 2013-11-09 MED ORDER — FAMOTIDINE IN NACL 20-0.9 MG/50ML-% IV SOLN
20.0000 mg | Freq: Once | INTRAVENOUS | Status: AC
  Administered 2013-11-09: 20 mg via INTRAVENOUS

## 2013-11-09 MED ORDER — DIPHENHYDRAMINE HCL 50 MG/ML IJ SOLN
INTRAMUSCULAR | Status: AC
  Filled 2013-11-09: qty 1

## 2013-11-09 MED ORDER — DIPHENHYDRAMINE HCL 50 MG/ML IJ SOLN
50.0000 mg | Freq: Once | INTRAMUSCULAR | Status: AC
  Administered 2013-11-09: 50 mg via INTRAVENOUS

## 2013-11-09 MED ORDER — PACLITAXEL CHEMO INJECTION 300 MG/50ML
175.0000 mg/m2 | Freq: Once | INTRAVENOUS | Status: AC
  Administered 2013-11-09: 282 mg via INTRAVENOUS
  Filled 2013-11-09: qty 47

## 2013-11-09 MED ORDER — ONDANSETRON 16 MG/50ML IVPB (CHCC)
INTRAVENOUS | Status: AC
  Filled 2013-11-09: qty 16

## 2013-11-09 MED ORDER — FAMOTIDINE IN NACL 20-0.9 MG/50ML-% IV SOLN
INTRAVENOUS | Status: AC
  Filled 2013-11-09: qty 50

## 2013-11-09 MED ORDER — DEXAMETHASONE SODIUM PHOSPHATE 20 MG/5ML IJ SOLN
INTRAMUSCULAR | Status: AC
  Filled 2013-11-09: qty 5

## 2013-11-09 NOTE — Patient Instructions (Signed)
Your restaging CT scan showed improvement in her disease Continue with labs as scheduled Followup in 3 weeks prior to the start of her next scheduled cycle of chemotherapy

## 2013-11-09 NOTE — Progress Notes (Signed)
Patient denies nausea.  Patient continues to have taste alterations.  She is drinking Ensure Plus or boost plus twice a day.  Weight decreased and was documented as 119.8 pounds May 26, down from 123.5 pounds.  May 5.  Patient continues to have bilateral edema.  Patient is receiving systemic chemotherapy for non-small cell lung cancer.  She voices no other nutrition concerns.  Nutrition diagnosis: Food and nutrition related knowledge deficit improved.  Intervention: Patient educated on strategies for improving taste alterations. Recommended patient take Ensure Plus or boost plus a minimum of 3 times a day.   Teach back method used.    Monitoring, evaluation, goals: Patient will increase Ensure Plus or boost +3 times a day daily to support weight maintenance.  Next visit: Tuesday, June 16, during chemotherapy.

## 2013-11-09 NOTE — Progress Notes (Addendum)
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    CURRENT THERAPY: Systemic chemotherapy with carboplatin for AUC of 5 and paclitaxel 175 mg/M2 every 3 weeks with Neulasta support. Status post 3 cycle. 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   INTERVAL HISTORY: Stephanie Oliver 63 y.o. female returns for a scheduled regular symptom management visit for follow up accompanied by her caregiver, Stephanie Oliver. The patient is feeling fine today with no specific complaints except for mild fatigue and shortness breath with exertion. Overall she's tolerating the chemotherapy relatively well with the exception of some fatigue and weakness. She also has some constipation that is well managed with MiraLAX. She reports some numbness and cold feeling in her feet and hands. She states that her primary  care physician that this may be related to her thyroid. This could also be related to her level of anemia and hypotension. She denied having any significant chest pain, cough or hemoptysis. She denied having any fever or chills. She has no significant weight loss or night sweats. She voiced no other specific complaints today. She recently had a restaging CT scan of her chest, abdomen and pelvis with contrast to reevaluate her disease and presents to discuss the results. She is also here to proceed with cycle #4 of her systemic chemotherapy with carboplatin and paclitaxel with Neulasta support.   MEDICAL HISTORY: Past Medical History  Diagnosis Date  . Depression   . Panic attacks   . Hypertension   . Smoker   . Alcohol abuse   . History of radiation therapy 07/28/13-08/25/13    rt lung,37.5Gy/14fx  . Cancer     lung ca    ALLERGIES:  is allergic to paxil; lisinopril; and penicillins.  MEDICATIONS:  Current Outpatient Prescriptions  Medication Sig Dispense Refill  . albuterol (PROVENTIL HFA;VENTOLIN HFA) 108 (90 BASE) MCG/ACT inhaler Inhale 2 puffs into the lungs 2 (two) times daily.      Marland Kitchen ALPRAZolam (XANAX) 0.5 MG tablet Take 1 tablet (0.5 mg total) by mouth 2 (two) times daily as needed for anxiety.  30 tablet  0  . amiodarone (PACERONE) 200 MG tablet Take 1 tablet (200 mg total) by mouth daily.  30 tablet  1  . apixaban (ELIQUIS) 5 MG TABS tablet Take 1 tablet (5 mg total) by mouth 2 (two) times daily.  60 tablet  1  . digoxin (LANOXIN) 0.125 MG tablet  Take 1 tablet (0.125 mg total) by mouth daily.  30 tablet  1  . diltiazem (CARDIZEM CD) 180 MG 24 hr capsule Take 180 mg by mouth daily.      . feeding supplement, ENSURE COMPLETE, (ENSURE COMPLETE) LIQD Take 237 mLs by mouth 2 (two) times daily between meals.  60 Bottle  6  . folic acid (FOLVITE) 1 MG tablet Take 1 tablet (1 mg total) by mouth daily.  30 tablet  1  . furosemide (LASIX) 40 MG tablet Take 40 mg by mouth daily.      .  iron polysaccharides (NIFEREX) 150 MG capsule Take 1 capsule (150 mg total) by mouth daily.  30 capsule  1  . levalbuterol (XOPENEX) 0.63 MG/3ML nebulizer solution Take 0.63 mg by nebulization every 4 (four) hours as needed for wheezing or shortness of breath.      . loratadine (CLARITIN) 10 MG tablet Take 10 mg by mouth daily.      . metoprolol tartrate (LOPRESSOR) 25 MG tablet Take 25 mg by mouth 2 (two) times daily.      . Multiple Vitamin (MULTIVITAMIN WITH MINERALS) TABS tablet Take 1 tablet by mouth daily.      Marland Kitchen oxyCODONE (OXY IR/ROXICODONE) 5 MG immediate release tablet Take 1 tablet (5 mg total) by mouth as needed for severe pain (1 tablet every 4-6 hours as needed).  40 tablet  0  . pantoprazole (PROTONIX) 40 MG tablet Take 1 tablet (40 mg total) by mouth daily.  30 tablet  1  . potassium chloride SA (K-DUR,KLOR-CON) 20 MEQ tablet Take 1 tablet (20 mEq total) by mouth daily.  60 tablet  1  . thiamine 100 MG tablet Take 1 tablet (100 mg total) by mouth daily.  30 tablet  1  . diclofenac sodium (VOLTAREN) 1 % GEL Apply 4 g topically 4 (four) times daily as needed (for pain).      Marland Kitchen diphenoxylate-atropine (LOMOTIL) 2.5-0.025 MG per tablet Take 1 tablet by mouth 4 (four) times daily as needed for diarrhea or loose stools.      . docusate sodium (COLACE) 100 MG capsule Take 1 capsule (100 mg total) by mouth 2 (two) times daily. Continue this while taking narcotics to help with bowel movements  30 capsule  1  . morphine (MS CONTIN) 30 MG 12 hr tablet Take 1 tablet (30 mg total) by mouth every 12 (twelve) hours.  60 tablet  0  . prochlorperazine (COMPAZINE) 10 MG tablet        No current facility-administered medications for this visit.   Facility-Administered Medications Ordered in Other Visits  Medication Dose Route Frequency Provider Last Rate Last Dose  . CARBOplatin (PARAPLATIN) 450 mg in sodium chloride 0.9 % 250 mL chemo infusion  450 mg Intravenous Once Curt Bears, MD      .  famotidine (PEPCID) IVPB 20 mg  20 mg Intravenous Once Curt Bears, MD   20 mg at 11/09/13 1122  . PACLitaxel (TAXOL) 282 mg in dextrose 5 % 250 mL chemo infusion (> 80mg /m2)  175 mg/m2 (Treatment Plan Actual) Intravenous Once Curt Bears, MD        SURGICAL HISTORY:  Past Surgical History  Procedure Laterality Date  . Tonsillectomy    . Back surgery    . Femur im nail Right 06/22/2013    Procedure: RIGHT HIP GAMMA NAIL FIXATION   ;  Surgeon: Renette Butters, MD;  Location: [facility];  Service: Orthopedics;  Laterality: Right;  .  Video bronchoscopy with endobronchial ultrasound N/A 06/22/2013    Procedure: VIDEO BRONCHOSCOPY WITH ENDOBRONCHIAL ULTRASOUND;  Surgeon: Doree Fudge, MD;  Location: [facility];  Service: Pulmonary;  Laterality: N/A;    REVIEW OF SYSTEMS:  Constitutional: positive for fatigue and malaise Eyes: negative Ears, nose, mouth, throat, and face: negative Respiratory: positive for cough and dyspnea on exertion Cardiovascular: negative Gastrointestinal: negative Genitourinary:negative Integument/breast: negative Hematologic/lymphatic: negative Musculoskeletal:positive for bone pain Neurological: positive for weakness Behavioral/Psych: positive for anxiety Endocrine: negative Allergic/Immunologic: negative   PHYSICAL EXAMINATION: General appearance: alert, cooperative, appears stated age and no distress Head: Normocephalic, without obvious abnormality, atraumatic Neck: no adenopathy, no carotid bruit, no JVD, supple, symmetrical, trachea midline and thyroid not enlarged, symmetric, no tenderness/mass/nodules Lymph nodes: Cervical, supraclavicular, and axillary nodes normal. Resp: clear to auscultation bilaterally Cardio: regular rate and rhythm, S1, S2 normal, no murmur, click, rub or gallop GI: soft, non-tender; bowel sounds normal; no masses,  no organomegaly Extremities: Patient continues to have pain related to her right hip stabilization surgery on  06/22/2013 as well as some right lower extremity edema Neurologic: Grossly normal  ECOG PERFORMANCE STATUS: 1 - Symptomatic but completely ambulatory  Blood pressure 87/44, pulse 102, temperature 98.7 F (37.1 C), temperature source Oral, resp. rate 18, height 5\' 3"  (1.6 m), weight 119 lb 12.8 oz (54.341 kg).  LABORATORY DATA: Lab Results  Component Value Date   WBC 12.6* 11/09/2013   HGB 9.9* 11/09/2013   HCT 30.0* 11/09/2013   MCV 96.3 11/09/2013   PLT 226 11/09/2013      Chemistry      Component Value Date/Time   NA 137 11/09/2013 0932   NA 131* 08/11/2013 1744   K 3.7 11/09/2013 0932   K 4.3 08/11/2013 1744   CL 90* 08/11/2013 1744   CO2 25 11/09/2013 0932   CO2 32 06/30/2013 1125   BUN 13.1 11/09/2013 0932   BUN 8 08/11/2013 1744   CREATININE 0.7 11/09/2013 0932   CREATININE 0.60 08/11/2013 1744      Component Value Date/Time   CALCIUM 10.3 11/09/2013 0932   CALCIUM 8.8 06/30/2013 1125   ALKPHOS 101 11/09/2013 0932   ALKPHOS 159* 06/30/2013 1125   AST 12 11/09/2013 0932   AST 31 06/30/2013 1125   ALT 12 11/09/2013 0932   ALT 43* 06/30/2013 1125   BILITOT 0.70 11/09/2013 0932   BILITOT 1.1 06/30/2013 1125       RADIOGRAPHIC STUDIES: Ct Chest W Contrast  11/02/2013   CLINICAL DATA:  Subsequent treatment strategy for lung cancer diagnosis in January 2015. Chemotherapy radiation therapy complete. Non-small cell lung cancer of the right lung.  EXAM: CT CHEST, ABDOMEN, AND PELVIS WITH CONTRAST  TECHNIQUE: Multidetector CT imaging of the chest, abdomen and pelvis was performed following the standard protocol during bolus administration of intravenous contrast.  CONTRAST:  158mL OMNIPAQUE IOHEXOL 300 MG/ML  SOLN  COMPARISON:  CT 08/11/2013, 08/04/2013  FINDINGS: CT CHEST FINDINGS  No axillary or supraclavicular lymphadenopathy. The perihilar mass in the right upper lobe decreased in volume measuring 2.0 x 2.6 cm (image 21, series 2) decreased from 6.1 x 4.9 cm on most recent CT (08/11/2013). No  discrete adenopathy. There is thickening in the right suprahilar location which is decreased from prior.  There is no central pulmonary embolism. There is decreased compression upon the right main pulmonary artery compared to prior. No pericardial fluid.  Review of the lung parenchyma is trace noted new pulmonary nodules.  CT ABDOMEN AND PELVIS FINDINGS  There is enhancing focus in the superior right hepatic lobe (image 34, series 2) which suggests a benign vascular phenomena. There is a lesion anterior aspect of the central left hepatic lobe measuring 2.6 cm (image 55, series 2) which we is decreased from comparison and demonstrated to be at metastasis on comparison PET exam. Central right hepatic metastasis measuring 15 mm decreased from 18 mm on prior.  Gallbladder, pancreas, spleen, adrenal glands, and kidneys are normal.  Stomach, small bowel, appendix, cecum normal. The colon rectosigmoid colon are normal.  Abdominal or is normal caliber. No retroperitoneal periportal lymphadenopathy. Peritoneum peritoneal disease. Post hysterectomy anatomy. The bladder is normal. No pelvic lymphadenopathy.  There is a internal fixation of the right hip. In pre sclerosis of the right C greater lesion.  IMPRESSION: 1. Marked decrease in volume of right upper lobe paramediastinal mass. 2. Decrease in volume of hepatic metastasis. 3. Increased sclerosis of the right sacral ale lesion could indicate treatment response. This lesion may be prone to pathologic fracture. 4. No new or progressive disease.   Electronically Signed   By: Suzy Bouchard M.D.   On: 11/02/2013 17:11   Ct Abdomen Pelvis W Contrast  11/02/2013   CLINICAL DATA:  Subsequent treatment strategy for lung cancer diagnosis in January 2015. Chemotherapy radiation therapy complete. Non-small cell lung cancer of the right lung.  EXAM: CT CHEST, ABDOMEN, AND PELVIS WITH CONTRAST  TECHNIQUE: Multidetector CT imaging of the chest, abdomen and pelvis was performed  following the standard protocol during bolus administration of intravenous contrast.  CONTRAST:  118mL OMNIPAQUE IOHEXOL 300 MG/ML  SOLN  COMPARISON:  CT 08/11/2013, 08/04/2013  FINDINGS: CT CHEST FINDINGS  No axillary or supraclavicular lymphadenopathy. The perihilar mass in the right upper lobe decreased in volume measuring 2.0 x 2.6 cm (image 21, series 2) decreased from 6.1 x 4.9 cm on most recent CT (08/11/2013). No discrete adenopathy. There is thickening in the right suprahilar location which is decreased from prior.  There is no central pulmonary embolism. There is decreased compression upon the right main pulmonary artery compared to prior. No pericardial fluid.  Review of the lung parenchyma is trace noted new pulmonary nodules.  CT ABDOMEN AND PELVIS FINDINGS  There is enhancing focus in the superior right hepatic lobe (image 34, series 2) which suggests a benign vascular phenomena. There is a lesion anterior aspect of the central left hepatic lobe measuring 2.6 cm (image 55, series 2) which we is decreased from comparison and demonstrated to be at metastasis on comparison PET exam. Central right hepatic metastasis measuring 15 mm decreased from 18 mm on prior.  Gallbladder, pancreas, spleen, adrenal glands, and kidneys are normal.  Stomach, small bowel, appendix, cecum normal. The colon rectosigmoid colon are normal.  Abdominal or is normal caliber. No retroperitoneal periportal lymphadenopathy. Peritoneum peritoneal disease. Post hysterectomy anatomy. The bladder is normal. No pelvic lymphadenopathy.  There is a internal fixation of the right hip. In pre sclerosis of the right C greater lesion.  IMPRESSION: 1. Marked decrease in volume of right upper lobe paramediastinal mass. 2. Decrease in volume of hepatic metastasis. 3. Increased sclerosis of the right sacral ale lesion could indicate treatment response. This lesion may be prone to pathologic fracture. 4. No new or progressive disease.    Electronically Signed   By: Suzy Bouchard M.D.   On: 11/02/2013 17:11    ASSESSMENT/PLAN:  This is a very pleasant 63 years old white female with recently diagnosed with stage IV non-small  cell lung cancer with recent PET scan showing liver and bone metastasis. She has a large right upper lobe lung mass with direct mediastinal invasion. The patient completed a course of concurrent chemoradiation to the right upper lobe lung mass and mediastinum for 3 weeks.  She is currently undergoing systemic chemotherapy with carboplatin and paclitaxel status post 3 cycles. She is tolerating her treatment fairly well with no significant adverse effects. Patient was discussed with him also seen by Dr. Julien Nordmann. Her restaging CT scan revealed marked decrease in volume of right upper lobe paramediastinal mass as well as decrease in volume of the hepatic metastasis. There is an increase sclerosis of the right sacral lesion with comment that this lesion may be prone to pathologic fracture. There is no new work aggressive disease. The results of the CT scan was discussed with the patient and her caregiver. Plan is for her to continue on her current chemotherapy. She will proceed with cycle #4 today as scheduled. She will continue with weekly labs as scheduled and followup in 3 weeks prior to the start of cycle #5.  She was advised to call immediately if she has any concerning symptoms in the interval.  Disclaimer: This note was dictated with voice recognition software. Similar sounding words can inadvertently be transcribed and may not be corrected upon review. Carlton Adam, PA-C 11/09/2013  ADDENDUM: Hematology/Oncology Attending: I had a face to face encounter with the patient today. I recommended her care plan. This is a very pleasant 63 years old white female with metastatic non-small cell lung cancer currently undergoing systemic chemotherapy with carboplatin and paclitaxel is status post 3 cycles. She is  tolerating her treatment well except for mild fatigue. Recent CT scan of the chest, abdomen and pelvis showed significant improvement in her disease with decrease in the volume of the right upper lobe paramediastinal mass as well as the hepatic metastases but there is increased there is sclerosis in the bone lesion which sometimes can be a result with the healing process. I discussed the scan results with the patient today. I recommended for her to continue her current treatment with carboplatin and paclitaxel. She will receive cycle #4 today. She would come back for followup visit in 3 weeks with the start of cycle #5. She was advised to call immediately if she has any concerning symptoms in the interval.  Disclaimer: This note was dictated with voice recognition software. Similar sounding words can inadvertently be transcribed and may not be corrected upon review. Curt Bears, MD 11/09/2013

## 2013-11-09 NOTE — Patient Instructions (Signed)
[facility] Discharge Instructions for Patients Receiving Chemotherapy  Today you received the following chemotherapy agents Taxol and Carboplatin.  To help prevent nausea and vomiting after your treatment, we encourage you to take your nausea medication Compazine 10 mg every 6 hours   If you develop nausea and vomiting that is not controlled by your nausea medication, call the clinic.   BELOW ARE SYMPTOMS THAT SHOULD BE REPORTED IMMEDIATELY:  *FEVER GREATER THAN 100.5 F  *CHILLS WITH OR WITHOUT FEVER  NAUSEA AND VOMITING THAT IS NOT CONTROLLED WITH YOUR NAUSEA MEDICATION  *UNUSUAL SHORTNESS OF BREATH  *UNUSUAL BRUISING OR BLEEDING  TENDERNESS IN MOUTH AND THROAT WITH OR WITHOUT PRESENCE OF ULCERS  *URINARY PROBLEMS  *BOWEL PROBLEMS  UNUSUAL RASH Items with * indicate a potential emergency and should be followed up as soon as possible.  Feel free to call the clinic you have any questions or concerns. The clinic phone number is [phone].

## 2013-11-10 ENCOUNTER — Ambulatory Visit (HOSPITAL_BASED_OUTPATIENT_CLINIC_OR_DEPARTMENT_OTHER)

## 2013-11-10 VITALS — BP 98/55 | HR 101 | Temp 96.9°F | Resp 16

## 2013-11-10 DIAGNOSIS — C787 Secondary malignant neoplasm of liver and intrahepatic bile duct: Secondary | ICD-10-CM

## 2013-11-10 DIAGNOSIS — C3410 Malignant neoplasm of upper lobe, unspecified bronchus or lung: Secondary | ICD-10-CM

## 2013-11-10 DIAGNOSIS — Z5189 Encounter for other specified aftercare: Secondary | ICD-10-CM

## 2013-11-10 DIAGNOSIS — C7952 Secondary malignant neoplasm of bone marrow: Secondary | ICD-10-CM

## 2013-11-10 DIAGNOSIS — C7951 Secondary malignant neoplasm of bone: Secondary | ICD-10-CM

## 2013-11-10 DIAGNOSIS — C3491 Malignant neoplasm of unspecified part of right bronchus or lung: Secondary | ICD-10-CM

## 2013-11-10 MED ORDER — PEGFILGRASTIM INJECTION 6 MG/0.6ML
6.0000 mg | Freq: Once | SUBCUTANEOUS | Status: AC
  Administered 2013-11-10: 6 mg via SUBCUTANEOUS
  Filled 2013-11-10: qty 0.6

## 2013-11-10 NOTE — Patient Instructions (Signed)

## 2013-11-12 ENCOUNTER — Other Ambulatory Visit: Payer: Self-pay | Admitting: Physician Assistant

## 2013-11-15 ENCOUNTER — Telehealth: Payer: Self-pay | Admitting: Internal Medicine

## 2013-11-15 NOTE — Telephone Encounter (Signed)
returned Juanda Chance call and advised on June appt...she will come back to get sched

## 2013-11-16 ENCOUNTER — Other Ambulatory Visit (HOSPITAL_BASED_OUTPATIENT_CLINIC_OR_DEPARTMENT_OTHER)

## 2013-11-16 ENCOUNTER — Other Ambulatory Visit: Payer: Self-pay | Admitting: Medical Oncology

## 2013-11-16 DIAGNOSIS — C3491 Malignant neoplasm of unspecified part of right bronchus or lung: Secondary | ICD-10-CM

## 2013-11-16 DIAGNOSIS — C3490 Malignant neoplasm of unspecified part of unspecified bronchus or lung: Secondary | ICD-10-CM

## 2013-11-16 DIAGNOSIS — C3410 Malignant neoplasm of upper lobe, unspecified bronchus or lung: Secondary | ICD-10-CM

## 2013-11-16 LAB — COMPREHENSIVE METABOLIC PANEL (CC13)
ALK PHOS: 115 U/L (ref 40–150)
ALT: 15 U/L (ref 0–55)
AST: 15 U/L (ref 5–34)
Albumin: 3.4 g/dL — ABNORMAL LOW (ref 3.5–5.0)
Anion Gap: 17 mEq/L — ABNORMAL HIGH (ref 3–11)
BILIRUBIN TOTAL: 0.55 mg/dL (ref 0.20–1.20)
BUN: 22.1 mg/dL (ref 7.0–26.0)
CO2: 22 mEq/L (ref 22–29)
Calcium: 9.6 mg/dL (ref 8.4–10.4)
Chloride: 99 mEq/L (ref 98–109)
Creatinine: 0.8 mg/dL (ref 0.6–1.1)
GLUCOSE: 124 mg/dL (ref 70–140)
Potassium: 4.2 mEq/L (ref 3.5–5.1)
Sodium: 138 mEq/L (ref 136–145)
Total Protein: 6.9 g/dL (ref 6.4–8.3)

## 2013-11-16 LAB — CBC WITH DIFFERENTIAL/PLATELET
BASO%: 0.5 % (ref 0.0–2.0)
Basophils Absolute: 0 10*3/uL (ref 0.0–0.1)
EOS ABS: 0.1 10*3/uL (ref 0.0–0.5)
EOS%: 1.7 % (ref 0.0–7.0)
HCT: 28.1 % — ABNORMAL LOW (ref 34.8–46.6)
HGB: 9.2 g/dL — ABNORMAL LOW (ref 11.6–15.9)
LYMPH%: 16.1 % (ref 14.0–49.7)
MCH: 31.8 pg (ref 25.1–34.0)
MCHC: 33 g/dL (ref 31.5–36.0)
MCV: 96.4 fL (ref 79.5–101.0)
MONO#: 0.2 10*3/uL (ref 0.1–0.9)
MONO%: 5.4 % (ref 0.0–14.0)
NEUT#: 2.3 10*3/uL (ref 1.5–6.5)
NEUT%: 76.3 % (ref 38.4–76.8)
PLATELETS: 135 10*3/uL — AB (ref 145–400)
RBC: 2.91 10*6/uL — ABNORMAL LOW (ref 3.70–5.45)
RDW: 17.8 % — ABNORMAL HIGH (ref 11.2–14.5)
WBC: 3 10*3/uL — ABNORMAL LOW (ref 3.9–10.3)
lymph#: 0.5 10*3/uL — ABNORMAL LOW (ref 0.9–3.3)

## 2013-11-16 MED ORDER — OXYCODONE HCL 5 MG PO TABS
5.0000 mg | ORAL_TABLET | ORAL | Status: DC | PRN
Start: 2013-11-16 — End: 2013-11-25

## 2013-11-16 NOTE — Telephone Encounter (Signed)
rx given to pt

## 2013-11-23 ENCOUNTER — Other Ambulatory Visit (HOSPITAL_BASED_OUTPATIENT_CLINIC_OR_DEPARTMENT_OTHER)

## 2013-11-23 DIAGNOSIS — C3410 Malignant neoplasm of upper lobe, unspecified bronchus or lung: Secondary | ICD-10-CM

## 2013-11-23 DIAGNOSIS — C3490 Malignant neoplasm of unspecified part of unspecified bronchus or lung: Secondary | ICD-10-CM

## 2013-11-23 LAB — CBC WITH DIFFERENTIAL/PLATELET
BASO%: 0.1 % (ref 0.0–2.0)
Basophils Absolute: 0 10*3/uL (ref 0.0–0.1)
EOS ABS: 0 10*3/uL (ref 0.0–0.5)
EOS%: 0.3 % (ref 0.0–7.0)
HCT: 28.2 % — ABNORMAL LOW (ref 34.8–46.6)
HGB: 9.4 g/dL — ABNORMAL LOW (ref 11.6–15.9)
LYMPH%: 9.4 % — AB (ref 14.0–49.7)
MCH: 32.7 pg (ref 25.1–34.0)
MCHC: 33.5 g/dL (ref 31.5–36.0)
MCV: 97.8 fL (ref 79.5–101.0)
MONO#: 1.2 10*3/uL — ABNORMAL HIGH (ref 0.1–0.9)
MONO%: 13.3 % (ref 0.0–14.0)
NEUT#: 6.9 10*3/uL — ABNORMAL HIGH (ref 1.5–6.5)
NEUT%: 76.9 % — ABNORMAL HIGH (ref 38.4–76.8)
Platelets: 159 10*3/uL (ref 145–400)
RBC: 2.89 10*6/uL — AB (ref 3.70–5.45)
RDW: 18.5 % — ABNORMAL HIGH (ref 11.2–14.5)
WBC: 9 10*3/uL (ref 3.9–10.3)
lymph#: 0.8 10*3/uL — ABNORMAL LOW (ref 0.9–3.3)

## 2013-11-23 LAB — COMPREHENSIVE METABOLIC PANEL (CC13)
ALK PHOS: 143 U/L (ref 40–150)
ALT: 20 U/L (ref 0–55)
AST: 15 U/L (ref 5–34)
Albumin: 3.4 g/dL — ABNORMAL LOW (ref 3.5–5.0)
Anion Gap: 11 mEq/L (ref 3–11)
BILIRUBIN TOTAL: 0.3 mg/dL (ref 0.20–1.20)
BUN: 10.6 mg/dL (ref 7.0–26.0)
CO2: 31 mEq/L — ABNORMAL HIGH (ref 22–29)
Calcium: 9.2 mg/dL (ref 8.4–10.4)
Chloride: 97 mEq/L — ABNORMAL LOW (ref 98–109)
Creatinine: 0.8 mg/dL (ref 0.6–1.1)
Glucose: 127 mg/dl (ref 70–140)
Potassium: 3.5 mEq/L (ref 3.5–5.1)
Sodium: 139 mEq/L (ref 136–145)
TOTAL PROTEIN: 6.9 g/dL (ref 6.4–8.3)

## 2013-11-25 ENCOUNTER — Other Ambulatory Visit: Payer: Self-pay | Admitting: *Deleted

## 2013-11-25 DIAGNOSIS — C3491 Malignant neoplasm of unspecified part of right bronchus or lung: Secondary | ICD-10-CM

## 2013-11-25 MED ORDER — OXYCODONE HCL 5 MG PO TABS: 5.0000 mg | ORAL_TABLET | ORAL | Status: DC | PRN

## 2013-11-26 ENCOUNTER — Other Ambulatory Visit: Payer: Self-pay | Admitting: Physical Medicine and Rehabilitation

## 2013-11-29 ENCOUNTER — Ambulatory Visit (HOSPITAL_BASED_OUTPATIENT_CLINIC_OR_DEPARTMENT_OTHER): Admitting: Nurse Practitioner

## 2013-11-29 ENCOUNTER — Other Ambulatory Visit (HOSPITAL_BASED_OUTPATIENT_CLINIC_OR_DEPARTMENT_OTHER)

## 2013-11-29 ENCOUNTER — Telehealth: Payer: Self-pay | Admitting: Internal Medicine

## 2013-11-29 ENCOUNTER — Encounter: Payer: Self-pay | Admitting: Nurse Practitioner

## 2013-11-29 VITALS — BP 93/49 | HR 96 | Temp 98.3°F | Resp 18 | Ht 63.0 in | Wt 118.2 lb

## 2013-11-29 DIAGNOSIS — G620 Drug-induced polyneuropathy: Secondary | ICD-10-CM

## 2013-11-29 DIAGNOSIS — C3410 Malignant neoplasm of upper lobe, unspecified bronchus or lung: Secondary | ICD-10-CM

## 2013-11-29 DIAGNOSIS — C787 Secondary malignant neoplasm of liver and intrahepatic bile duct: Secondary | ICD-10-CM

## 2013-11-29 DIAGNOSIS — C7951 Secondary malignant neoplasm of bone: Secondary | ICD-10-CM

## 2013-11-29 DIAGNOSIS — C3491 Malignant neoplasm of unspecified part of right bronchus or lung: Secondary | ICD-10-CM

## 2013-11-29 DIAGNOSIS — C7952 Secondary malignant neoplasm of bone marrow: Secondary | ICD-10-CM

## 2013-11-29 DIAGNOSIS — C3490 Malignant neoplasm of unspecified part of unspecified bronchus or lung: Secondary | ICD-10-CM

## 2013-11-29 LAB — COMPREHENSIVE METABOLIC PANEL (CC13)
ALT: 14 U/L (ref 0–55)
ANION GAP: 10 meq/L (ref 3–11)
AST: 13 U/L (ref 5–34)
Albumin: 3.1 g/dL — ABNORMAL LOW (ref 3.5–5.0)
Alkaline Phosphatase: 120 U/L (ref 40–150)
BILIRUBIN TOTAL: 0.37 mg/dL (ref 0.20–1.20)
BUN: 11.3 mg/dL (ref 7.0–26.0)
CO2: 27 mEq/L (ref 22–29)
Calcium: 9.3 mg/dL (ref 8.4–10.4)
Chloride: 102 mEq/L (ref 98–109)
Creatinine: 0.7 mg/dL (ref 0.6–1.1)
GLUCOSE: 125 mg/dL (ref 70–140)
Potassium: 4 mEq/L (ref 3.5–5.1)
Sodium: 138 mEq/L (ref 136–145)
Total Protein: 6.6 g/dL (ref 6.4–8.3)

## 2013-11-29 LAB — CBC WITH DIFFERENTIAL/PLATELET
BASO%: 0.1 % (ref 0.0–2.0)
BASOS ABS: 0 10*3/uL (ref 0.0–0.1)
EOS%: 0.3 % (ref 0.0–7.0)
Eosinophils Absolute: 0 10*3/uL (ref 0.0–0.5)
HEMATOCRIT: 27 % — AB (ref 34.8–46.6)
HGB: 8.9 g/dL — ABNORMAL LOW (ref 11.6–15.9)
LYMPH%: 8.9 % — AB (ref 14.0–49.7)
MCH: 32 pg (ref 25.1–34.0)
MCHC: 33 g/dL (ref 31.5–36.0)
MCV: 97.1 fL (ref 79.5–101.0)
MONO#: 1.3 10*3/uL — ABNORMAL HIGH (ref 0.1–0.9)
MONO%: 13.1 % (ref 0.0–14.0)
NEUT#: 7.4 10*3/uL — ABNORMAL HIGH (ref 1.5–6.5)
NEUT%: 77.6 % — AB (ref 38.4–76.8)
PLATELETS: 162 10*3/uL (ref 145–400)
RBC: 2.78 10*6/uL — ABNORMAL LOW (ref 3.70–5.45)
RDW: 17 % — ABNORMAL HIGH (ref 11.2–14.5)
WBC: 9.6 10*3/uL (ref 3.9–10.3)
lymph#: 0.9 10*3/uL (ref 0.9–3.3)

## 2013-11-29 NOTE — Progress Notes (Addendum)
[facility] OFFICE PROGRESS NOTE   Diagnosis:  Non-small cell lung cancer.  INTERVAL HISTORY:   Stephanie Oliver returns as scheduled. She completed cycle 4 carboplatin/Taxol on 11/09/2013. She received Neulasta support. She denies nausea/vomiting. No mouth sores. No diarrhea. She is intermittently constipated. She reports persistent numbness/tingling in the fingertips and toes. She is having difficulty grasping objects. Stable dyspnea on exertion. No fever. Cough is better. She intermittently expectorates clear phlegm. Appetite is "poor". She continues to have pain at the right sacrum. The pain has been present since surgery and is unchanged.  Objective:  Vital signs in last 24 hours:  Blood pressure 93/49, pulse 96, temperature 98.3 F (36.8 C), temperature source Oral, resp. rate 18, height 5\' 3"  (1.6 m), weight 118 lb 3.2 oz (53.615 kg), SpO2 98.00%.   She was examined in the wheelchair as she did not feel she could maneuver onto the exam table. HEENT: No thrush or ulcerations. Resp: Lungs clear. Distant breath sounds. Cardio: Regular cardiac rhythm. GI: Abdomen soft and nontender. No hepatomegaly. Vascular: No leg edema. Neuro: Vibratory sense moderately to severely decreased over the fingertips per tuning fork exam.     Lab Results:  Lab Results  Component Value Date   WBC 9.6 11/29/2013   HGB 8.9* 11/29/2013   HCT 27.0* 11/29/2013   MCV 97.1 11/29/2013   PLT 162 11/29/2013   NEUTROABS 7.4* 11/29/2013    Imaging:  No results found.  Medications: I have reviewed the patient's current medications.  Assessment/Plan: 1. Stage IV non-small cell lung cancer involving the liver and bones status post radiation and concurrent weekly Taxol/carboplatin; initiation of every three-week carboplatin/Taxol beginning 09/04/2013. Restaging CT evaluation after 3 cycles showed marked improvement in the right upper lobe paramediastinal mass, decrease in volume of hepatic metastasis  and increased sclerosis of the right sacral lesion possibly indicating treatment response. No new or progressive disease. Cycle 4 Taxol/carboplatin 11/09/2013. 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  3. Right hip fracture status post nail fixation 06/22/2013.   Disposition: Ms. Odden has completed 4 cycles of Taxol/carboplatin chemotherapy. She is scheduled for cycle 5 on 11/30/2013. The Taxol dose will be reduced due to peripheral neuropathy now interfering with activity.  She will return for a followup visit and cycle 6 Taxol/carboplatin in 3 weeks. She will contact the office in the interim with any problems.  Patient seen with Dr. Julien Nordmann.    Ned Card ANP/GNP-BC   11/29/2013  4:16 PM  ADDENDUM: Hematology/Oncology Attending: I had a face to face encounter with the patient today. I recommended her care plan. She is a very pleasant 63 years old white female with metastatic non-small cell lung cancer, squamous cell carcinoma currently undergoing systemic chemotherapy with carboplatin and paclitaxel status post 4 cycles. She is tolerating her treatment fairly well with no significant adverse effects except for increasing peripheral neuropathy. She also has pain on the lower back with radiation to the leg secondary to metastatic bone disease. I recommended for the patient to proceed with her current treatment was carboplatin and paclitaxel as scheduled but I would reduce the dose of paclitaxel to 150 mg/M2 starting from cycle #5 because of the peripheral neuropathy. She would come back for follow up visit in 3 weeks with the start of cycle #6. She was advised to call immediately if she has any concerning symptoms in the interval.   Disclaimer: This note was dictated with voice recognition software. Similar sounding words can inadvertently be transcribed and may  not be corrected upon review. Stephanie Oliver., MD 11/29/2013

## 2013-11-29 NOTE — Telephone Encounter (Signed)
gv and printed appt sched and avs fo rpt for July °

## 2013-11-30 ENCOUNTER — Ambulatory Visit (HOSPITAL_BASED_OUTPATIENT_CLINIC_OR_DEPARTMENT_OTHER)

## 2013-11-30 ENCOUNTER — Encounter: Payer: Self-pay | Admitting: Internal Medicine

## 2013-11-30 ENCOUNTER — Ambulatory Visit: Admitting: Nutrition

## 2013-11-30 VITALS — BP 108/58 | HR 92 | Temp 97.1°F | Resp 18

## 2013-11-30 DIAGNOSIS — Z5111 Encounter for antineoplastic chemotherapy: Secondary | ICD-10-CM

## 2013-11-30 DIAGNOSIS — C787 Secondary malignant neoplasm of liver and intrahepatic bile duct: Secondary | ICD-10-CM

## 2013-11-30 DIAGNOSIS — C3410 Malignant neoplasm of upper lobe, unspecified bronchus or lung: Secondary | ICD-10-CM

## 2013-11-30 DIAGNOSIS — C3491 Malignant neoplasm of unspecified part of right bronchus or lung: Secondary | ICD-10-CM

## 2013-11-30 MED ORDER — ONDANSETRON 16 MG/50ML IVPB (CHCC)
INTRAVENOUS | Status: AC
  Filled 2013-11-30: qty 16

## 2013-11-30 MED ORDER — FAMOTIDINE IN NACL 20-0.9 MG/50ML-% IV SOLN
INTRAVENOUS | Status: AC
  Filled 2013-11-30: qty 50

## 2013-11-30 MED ORDER — DIPHENHYDRAMINE HCL 50 MG/ML IJ SOLN
50.0000 mg | Freq: Once | INTRAMUSCULAR | Status: AC
  Administered 2013-11-30: 50 mg via INTRAVENOUS

## 2013-11-30 MED ORDER — PACLITAXEL CHEMO INJECTION 300 MG/50ML
150.0000 mg/m2 | Freq: Once | INTRAVENOUS | Status: AC
  Administered 2013-11-30: 240 mg via INTRAVENOUS
  Filled 2013-11-30: qty 40

## 2013-11-30 MED ORDER — ONDANSETRON 16 MG/50ML IVPB (CHCC)
16.0000 mg | Freq: Once | INTRAVENOUS | Status: AC
  Administered 2013-11-30: 16 mg via INTRAVENOUS

## 2013-11-30 MED ORDER — DEXAMETHASONE SODIUM PHOSPHATE 20 MG/5ML IJ SOLN
INTRAMUSCULAR | Status: AC
  Filled 2013-11-30: qty 5

## 2013-11-30 MED ORDER — DIPHENHYDRAMINE HCL 50 MG/ML IJ SOLN
INTRAMUSCULAR | Status: AC
  Filled 2013-11-30: qty 1

## 2013-11-30 MED ORDER — SODIUM CHLORIDE 0.9 % IV SOLN
Freq: Once | INTRAVENOUS | Status: AC
  Administered 2013-11-30: 15:00:00 via INTRAVENOUS

## 2013-11-30 MED ORDER — DEXAMETHASONE SODIUM PHOSPHATE 20 MG/5ML IJ SOLN
20.0000 mg | Freq: Once | INTRAMUSCULAR | Status: AC
Start: 2013-11-30 — End: 2013-11-30
  Administered 2013-11-30: 20 mg via INTRAVENOUS

## 2013-11-30 MED ORDER — FAMOTIDINE IN NACL 20-0.9 MG/50ML-% IV SOLN
20.0000 mg | Freq: Once | INTRAVENOUS | Status: AC
  Administered 2013-11-30: 20 mg via INTRAVENOUS

## 2013-11-30 MED ORDER — SODIUM CHLORIDE 0.9 % IV SOLN
449.0000 mg | Freq: Once | INTRAVENOUS | Status: AC
  Administered 2013-11-30: 450 mg via INTRAVENOUS
  Filled 2013-11-30: qty 45

## 2013-11-30 NOTE — Progress Notes (Signed)
Per Demetria B [employer] rule ([employer])termed 08/30/13.

## 2013-11-30 NOTE — Patient Instructions (Signed)
[facility] Discharge Instructions for Patients Receiving Chemotherapy  Today you received the following chemotherapy agents taxol, carboplatin.  To help prevent nausea and vomiting after your treatment, we encourage you to take your nausea medication Compazine 10 mg as ordered.   If you develop nausea and vomiting that is not controlled by your nausea medication, call the clinic.   BELOW ARE SYMPTOMS THAT SHOULD BE REPORTED IMMEDIATELY:  *FEVER GREATER THAN 100.5 F  *CHILLS WITH OR WITHOUT FEVER  NAUSEA AND VOMITING THAT IS NOT CONTROLLED WITH YOUR NAUSEA MEDICATION  *UNUSUAL SHORTNESS OF BREATH  *UNUSUAL BRUISING OR BLEEDING  TENDERNESS IN MOUTH AND THROAT WITH OR WITHOUT PRESENCE OF ULCERS  *URINARY PROBLEMS  *BOWEL PROBLEMS  UNUSUAL RASH Items with * indicate a potential emergency and should be followed up as soon as possible.  Feel free to call the clinic you have any questions or concerns. The clinic phone number is [phone].

## 2013-11-30 NOTE — Progress Notes (Signed)
Patient reports she feels tired.  Weight has decreased to 118.2 pounds June 15, from 119.8 pounds.  May 26.  Patient is receiving systemic chemotherapy for non-small cell lung cancer.  She reports she drinks Ensure Plus "all the time."  Nutrition diagnosis: Food and nutrition related knowledge deficit resolved.  Reinforced the importance of patient consuming high-calorie, high-protein foods for weight maintenance.  Encouraged continuation of Ensure Plus or boost plus as tolerated.  Patient to contact me with further questions or concerns.

## 2013-11-30 NOTE — Progress Notes (Unsigned)
0721[name] ([phone]) notifying her [name] will be ready for pick up at 7:00 pm.

## 2013-12-01 ENCOUNTER — Ambulatory Visit (HOSPITAL_BASED_OUTPATIENT_CLINIC_OR_DEPARTMENT_OTHER)

## 2013-12-01 VITALS — BP 101/62 | HR 102 | Temp 97.0°F

## 2013-12-01 DIAGNOSIS — C3410 Malignant neoplasm of upper lobe, unspecified bronchus or lung: Secondary | ICD-10-CM

## 2013-12-01 DIAGNOSIS — Z5189 Encounter for other specified aftercare: Secondary | ICD-10-CM

## 2013-12-01 DIAGNOSIS — C3491 Malignant neoplasm of unspecified part of right bronchus or lung: Secondary | ICD-10-CM

## 2013-12-01 MED ORDER — PEGFILGRASTIM INJECTION 6 MG/0.6ML
6.0000 mg | Freq: Once | SUBCUTANEOUS | Status: AC
  Administered 2013-12-01: 6 mg via SUBCUTANEOUS
  Filled 2013-12-01: qty 0.6

## 2013-12-01 NOTE — Patient Instructions (Signed)

## 2013-12-03 ENCOUNTER — Telehealth: Payer: Self-pay | Admitting: Medical Oncology

## 2013-12-03 NOTE — Telephone Encounter (Signed)
Billing question .

## 2013-12-07 ENCOUNTER — Other Ambulatory Visit (HOSPITAL_BASED_OUTPATIENT_CLINIC_OR_DEPARTMENT_OTHER)

## 2013-12-07 DIAGNOSIS — C787 Secondary malignant neoplasm of liver and intrahepatic bile duct: Secondary | ICD-10-CM

## 2013-12-07 DIAGNOSIS — C3410 Malignant neoplasm of upper lobe, unspecified bronchus or lung: Secondary | ICD-10-CM

## 2013-12-07 DIAGNOSIS — C7952 Secondary malignant neoplasm of bone marrow: Secondary | ICD-10-CM

## 2013-12-07 DIAGNOSIS — C7951 Secondary malignant neoplasm of bone: Secondary | ICD-10-CM

## 2013-12-07 DIAGNOSIS — C3490 Malignant neoplasm of unspecified part of unspecified bronchus or lung: Secondary | ICD-10-CM

## 2013-12-07 LAB — COMPREHENSIVE METABOLIC PANEL (CC13)
ALBUMIN: 3.4 g/dL — AB (ref 3.5–5.0)
ALK PHOS: 121 U/L (ref 40–150)
ALT: 17 U/L (ref 0–55)
ANION GAP: 11 meq/L (ref 3–11)
AST: 15 U/L (ref 5–34)
BILIRUBIN TOTAL: 0.55 mg/dL (ref 0.20–1.20)
BUN: 16.3 mg/dL (ref 7.0–26.0)
CO2: 29 mEq/L (ref 22–29)
Calcium: 9.7 mg/dL (ref 8.4–10.4)
Chloride: 101 mEq/L (ref 98–109)
Creatinine: 0.8 mg/dL (ref 0.6–1.1)
Glucose: 141 mg/dl — ABNORMAL HIGH (ref 70–140)
POTASSIUM: 3.7 meq/L (ref 3.5–5.1)
SODIUM: 140 meq/L (ref 136–145)
TOTAL PROTEIN: 6.7 g/dL (ref 6.4–8.3)

## 2013-12-07 LAB — CBC WITH DIFFERENTIAL/PLATELET
BASO%: 0.3 % (ref 0.0–2.0)
BASOS ABS: 0 10*3/uL (ref 0.0–0.1)
EOS%: 1.1 % (ref 0.0–7.0)
Eosinophils Absolute: 0 10*3/uL (ref 0.0–0.5)
HCT: 26.7 % — ABNORMAL LOW (ref 34.8–46.6)
HEMOGLOBIN: 8.8 g/dL — AB (ref 11.6–15.9)
LYMPH%: 17.8 % (ref 14.0–49.7)
MCH: 32.6 pg (ref 25.1–34.0)
MCHC: 33 g/dL (ref 31.5–36.0)
MCV: 98.8 fL (ref 79.5–101.0)
MONO#: 0.2 10*3/uL (ref 0.1–0.9)
MONO%: 5.1 % (ref 0.0–14.0)
NEUT%: 75.7 % (ref 38.4–76.8)
NEUTROS ABS: 2.3 10*3/uL (ref 1.5–6.5)
PLATELETS: 123 10*3/uL — AB (ref 145–400)
RBC: 2.7 10*6/uL — ABNORMAL LOW (ref 3.70–5.45)
RDW: 16.3 % — ABNORMAL HIGH (ref 11.2–14.5)
WBC: 3.1 10*3/uL — AB (ref 3.9–10.3)
lymph#: 0.5 10*3/uL — ABNORMAL LOW (ref 0.9–3.3)

## 2013-12-08 ENCOUNTER — Ambulatory Visit: Admitting: Podiatry

## 2013-12-14 ENCOUNTER — Other Ambulatory Visit (HOSPITAL_BASED_OUTPATIENT_CLINIC_OR_DEPARTMENT_OTHER)

## 2013-12-14 DIAGNOSIS — C3490 Malignant neoplasm of unspecified part of unspecified bronchus or lung: Secondary | ICD-10-CM

## 2013-12-14 LAB — COMPREHENSIVE METABOLIC PANEL (CC13)
ALT: 20 U/L (ref 0–55)
AST: 17 U/L (ref 5–34)
Albumin: 3.5 g/dL (ref 3.5–5.0)
Alkaline Phosphatase: 117 U/L (ref 40–150)
Anion Gap: 13 mEq/L — ABNORMAL HIGH (ref 3–11)
BUN: 12.8 mg/dL (ref 7.0–26.0)
CO2: 28 mEq/L (ref 22–29)
Calcium: 9.6 mg/dL (ref 8.4–10.4)
Chloride: 97 mEq/L — ABNORMAL LOW (ref 98–109)
Creatinine: 0.9 mg/dL (ref 0.6–1.1)
Glucose: 113 mg/dl (ref 70–140)
Potassium: 3.4 mEq/L — ABNORMAL LOW (ref 3.5–5.1)
Sodium: 138 mEq/L (ref 136–145)
Total Bilirubin: 0.42 mg/dL (ref 0.20–1.20)
Total Protein: 6.9 g/dL (ref 6.4–8.3)

## 2013-12-14 LAB — CBC WITH DIFFERENTIAL/PLATELET
BASO%: 0.2 % (ref 0.0–2.0)
Basophils Absolute: 0 10*3/uL (ref 0.0–0.1)
EOS%: 0.2 % (ref 0.0–7.0)
Eosinophils Absolute: 0 10*3/uL (ref 0.0–0.5)
HCT: 29.1 % — ABNORMAL LOW (ref 34.8–46.6)
HGB: 9.7 g/dL — ABNORMAL LOW (ref 11.6–15.9)
LYMPH%: 8.7 % — ABNORMAL LOW (ref 14.0–49.7)
MCH: 33 pg (ref 25.1–34.0)
MCHC: 33.3 g/dL (ref 31.5–36.0)
MCV: 99.3 fL (ref 79.5–101.0)
MONO#: 1.2 10*3/uL — ABNORMAL HIGH (ref 0.1–0.9)
MONO%: 11.9 % (ref 0.0–14.0)
NEUT#: 8.1 10*3/uL — ABNORMAL HIGH (ref 1.5–6.5)
NEUT%: 79 % — ABNORMAL HIGH (ref 38.4–76.8)
Platelets: 152 10*3/uL (ref 145–400)
RBC: 2.93 10*6/uL — ABNORMAL LOW (ref 3.70–5.45)
RDW: 17 % — ABNORMAL HIGH (ref 11.2–14.5)
WBC: 10.2 10*3/uL (ref 3.9–10.3)
lymph#: 0.9 10*3/uL (ref 0.9–3.3)

## 2013-12-15 ENCOUNTER — Other Ambulatory Visit: Payer: Self-pay | Admitting: *Deleted

## 2013-12-15 DIAGNOSIS — C3491 Malignant neoplasm of unspecified part of right bronchus or lung: Secondary | ICD-10-CM

## 2013-12-15 MED ORDER — OXYCODONE HCL 5 MG PO TABS: 5.0000 mg | ORAL_TABLET | ORAL | Status: DC | PRN

## 2013-12-21 ENCOUNTER — Other Ambulatory Visit (HOSPITAL_BASED_OUTPATIENT_CLINIC_OR_DEPARTMENT_OTHER)

## 2013-12-21 ENCOUNTER — Other Ambulatory Visit

## 2013-12-21 ENCOUNTER — Encounter: Payer: Self-pay | Admitting: Physician Assistant

## 2013-12-21 ENCOUNTER — Ambulatory Visit (HOSPITAL_BASED_OUTPATIENT_CLINIC_OR_DEPARTMENT_OTHER)

## 2013-12-21 ENCOUNTER — Other Ambulatory Visit: Payer: Self-pay | Admitting: Medical Oncology

## 2013-12-21 ENCOUNTER — Ambulatory Visit (HOSPITAL_BASED_OUTPATIENT_CLINIC_OR_DEPARTMENT_OTHER): Admitting: Physician Assistant

## 2013-12-21 ENCOUNTER — Ambulatory Visit

## 2013-12-21 VITALS — BP 91/58 | HR 95 | Temp 97.9°F | Resp 18 | Ht 63.0 in | Wt 113.0 lb

## 2013-12-21 DIAGNOSIS — C7951 Secondary malignant neoplasm of bone: Secondary | ICD-10-CM

## 2013-12-21 DIAGNOSIS — C787 Secondary malignant neoplasm of liver and intrahepatic bile duct: Secondary | ICD-10-CM

## 2013-12-21 DIAGNOSIS — C3491 Malignant neoplasm of unspecified part of right bronchus or lung: Secondary | ICD-10-CM

## 2013-12-21 DIAGNOSIS — C7952 Secondary malignant neoplasm of bone marrow: Secondary | ICD-10-CM

## 2013-12-21 DIAGNOSIS — C3410 Malignant neoplasm of upper lobe, unspecified bronchus or lung: Secondary | ICD-10-CM

## 2013-12-21 DIAGNOSIS — Z5111 Encounter for antineoplastic chemotherapy: Secondary | ICD-10-CM

## 2013-12-21 DIAGNOSIS — M949 Disorder of cartilage, unspecified: Secondary | ICD-10-CM

## 2013-12-21 DIAGNOSIS — R5381 Other malaise: Secondary | ICD-10-CM

## 2013-12-21 DIAGNOSIS — R5383 Other fatigue: Secondary | ICD-10-CM

## 2013-12-21 DIAGNOSIS — R0609 Other forms of dyspnea: Secondary | ICD-10-CM

## 2013-12-21 DIAGNOSIS — M899 Disorder of bone, unspecified: Secondary | ICD-10-CM

## 2013-12-21 DIAGNOSIS — R0989 Other specified symptoms and signs involving the circulatory and respiratory systems: Secondary | ICD-10-CM

## 2013-12-21 DIAGNOSIS — R609 Edema, unspecified: Secondary | ICD-10-CM

## 2013-12-21 LAB — COMPREHENSIVE METABOLIC PANEL (CC13)
ALT: 22 U/L (ref 0–55)
AST: 17 U/L (ref 5–34)
Albumin: 3.4 g/dL — ABNORMAL LOW (ref 3.5–5.0)
Alkaline Phosphatase: 104 U/L (ref 40–150)
Anion Gap: 13 mEq/L — ABNORMAL HIGH (ref 3–11)
BILIRUBIN TOTAL: 0.33 mg/dL (ref 0.20–1.20)
BUN: 9.3 mg/dL (ref 7.0–26.0)
CO2: 25 mEq/L (ref 22–29)
CREATININE: 0.8 mg/dL (ref 0.6–1.1)
Calcium: 9.7 mg/dL (ref 8.4–10.4)
Chloride: 101 mEq/L (ref 98–109)
Glucose: 122 mg/dl (ref 70–140)
Potassium: 3.9 mEq/L (ref 3.5–5.1)
SODIUM: 140 meq/L (ref 136–145)
TOTAL PROTEIN: 6.8 g/dL (ref 6.4–8.3)

## 2013-12-21 LAB — CBC WITH DIFFERENTIAL/PLATELET
BASO%: 0.9 % (ref 0.0–2.0)
Basophils Absolute: 0.1 10*3/uL (ref 0.0–0.1)
EOS%: 0.2 % (ref 0.0–7.0)
Eosinophils Absolute: 0 10*3/uL (ref 0.0–0.5)
HEMATOCRIT: 29.3 % — AB (ref 34.8–46.6)
HGB: 9.7 g/dL — ABNORMAL LOW (ref 11.6–15.9)
LYMPH#: 0.8 10*3/uL — AB (ref 0.9–3.3)
LYMPH%: 7.9 % — ABNORMAL LOW (ref 14.0–49.7)
MCH: 32.9 pg (ref 25.1–34.0)
MCHC: 33 g/dL (ref 31.5–36.0)
MCV: 99.7 fL (ref 79.5–101.0)
MONO#: 1.1 10*3/uL — ABNORMAL HIGH (ref 0.1–0.9)
MONO%: 11 % (ref 0.0–14.0)
NEUT#: 7.7 10*3/uL — ABNORMAL HIGH (ref 1.5–6.5)
NEUT%: 80 % — AB (ref 38.4–76.8)
Platelets: 178 10*3/uL (ref 145–400)
RBC: 2.94 10*6/uL — AB (ref 3.70–5.45)
RDW: 16.8 % — AB (ref 11.2–14.5)
WBC: 9.6 10*3/uL (ref 3.9–10.3)

## 2013-12-21 MED ORDER — DIPHENHYDRAMINE HCL 50 MG/ML IJ SOLN
50.0000 mg | Freq: Once | INTRAMUSCULAR | Status: AC
  Administered 2013-12-21: 50 mg via INTRAVENOUS

## 2013-12-21 MED ORDER — DEXAMETHASONE SODIUM PHOSPHATE 20 MG/5ML IJ SOLN
20.0000 mg | Freq: Once | INTRAMUSCULAR | Status: AC
  Administered 2013-12-21: 20 mg via INTRAVENOUS

## 2013-12-21 MED ORDER — DEXTROSE 5 % IV SOLN
150.0000 mg/m2 | Freq: Once | INTRAVENOUS | Status: AC
  Administered 2013-12-21: 240 mg via INTRAVENOUS
  Filled 2013-12-21: qty 40

## 2013-12-21 MED ORDER — DEXAMETHASONE SODIUM PHOSPHATE 20 MG/5ML IJ SOLN
INTRAMUSCULAR | Status: AC
  Filled 2013-12-21: qty 5

## 2013-12-21 MED ORDER — ONDANSETRON 16 MG/50ML IVPB (CHCC)
INTRAVENOUS | Status: AC
  Filled 2013-12-21: qty 16

## 2013-12-21 MED ORDER — ONDANSETRON 16 MG/50ML IVPB (CHCC)
16.0000 mg | Freq: Once | INTRAVENOUS | Status: AC
  Administered 2013-12-21: 16 mg via INTRAVENOUS

## 2013-12-21 MED ORDER — DIPHENHYDRAMINE HCL 50 MG/ML IJ SOLN
INTRAMUSCULAR | Status: AC
  Filled 2013-12-21: qty 1

## 2013-12-21 MED ORDER — FAMOTIDINE IN NACL 20-0.9 MG/50ML-% IV SOLN
INTRAVENOUS | Status: AC
  Filled 2013-12-21: qty 50

## 2013-12-21 MED ORDER — SODIUM CHLORIDE 0.9 % IV SOLN
Freq: Once | INTRAVENOUS | Status: AC
  Administered 2013-12-21: 12:00:00 via INTRAVENOUS

## 2013-12-21 MED ORDER — FAMOTIDINE IN NACL 20-0.9 MG/50ML-% IV SOLN
20.0000 mg | Freq: Once | INTRAVENOUS | Status: AC
  Administered 2013-12-21: 20 mg via INTRAVENOUS

## 2013-12-21 MED ORDER — CARBOPLATIN CHEMO INTRADERMAL TEST DOSE 100MCG/0.02ML
100.0000 ug | Freq: Once | INTRADERMAL | Status: AC
  Administered 2013-12-21: 100 ug via INTRADERMAL
  Filled 2013-12-21: qty 0.01

## 2013-12-21 MED ORDER — CARBOPLATIN CHEMO INJECTION 600 MG/60ML
450.0000 mg | Freq: Once | INTRAVENOUS | Status: AC
  Administered 2013-12-21: 450 mg via INTRAVENOUS
  Filled 2013-12-21: qty 45

## 2013-12-21 NOTE — Patient Instructions (Signed)
[facility] Discharge Instructions for Patients Receiving Chemotherapy  Today you received the following chemotherapy agents: Taxol/Carboplatin  To help prevent nausea and vomiting after your treatment, we encourage you to take your nausea medication. If you develop nausea and vomiting that is not controlled by your nausea medication, call the clinic.   BELOW ARE SYMPTOMS THAT SHOULD BE REPORTED IMMEDIATELY:  *FEVER GREATER THAN 100.5 F  *CHILLS WITH OR WITHOUT FEVER  NAUSEA AND VOMITING THAT IS NOT CONTROLLED WITH YOUR NAUSEA MEDICATION  *UNUSUAL SHORTNESS OF BREATH  *UNUSUAL BRUISING OR BLEEDING  TENDERNESS IN MOUTH AND THROAT WITH OR WITHOUT PRESENCE OF ULCERS  *URINARY PROBLEMS  *BOWEL PROBLEMS  UNUSUAL RASH Items with * indicate a potential emergency and should be followed up as soon as possible.  Feel free to call the clinic you have any questions or concerns. The clinic phone number is [phone].

## 2013-12-21 NOTE — Progress Notes (Signed)
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    CURRENT THERAPY: Systemic chemotherapy with carboplatin for AUC of 5 and paclitaxel 175 mg/M2 every 3 weeks with Neulasta support. Status post 5 cycle. 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   INTERVAL HISTORY: Stephanie Oliver 63 y.o. female returns for a scheduled regular symptom management visit for follow up accompanied by her caregiver, Stephanie Oliver. The patient is feeling fine today with no specific complaints except for mild fatigue, shortness breath with exertion and mild peripheral neuropathy symptoms in her hands and feet. She reports that the neuropathy symptoms are getting a little better. She complains of her left ear feeling clogged like with swimmer's ear". She denied any fever or chills. Overall she's  tolerating the chemotherapy relatively well with the exception of some fatigue and weakness. She also has some constipation that is managed with MiraLAX. She denied having any significant chest pain, cough or hemoptysis. She denied having any fever or chills. She has no significant weight loss or night sweats. She voiced no other specific complaints today. She is  here to proceed with cycle #6 of her systemic chemotherapy with carboplatin and paclitaxel with Neulasta support.   MEDICAL HISTORY: Past Medical History  Diagnosis Date  . Depression   . Panic attacks   . Hypertension   . Smoker   . Alcohol abuse   . History of radiation therapy 07/28/13-08/25/13    rt lung,37.5Gy/37fx  . Cancer     lung ca    ALLERGIES:  is allergic to paxil; lisinopril; and penicillins.  MEDICATIONS:  Current Outpatient Prescriptions  Medication Sig Dispense Refill  . albuterol (PROVENTIL HFA;VENTOLIN HFA) 108 (90 BASE) MCG/ACT inhaler Inhale 2 puffs into the lungs 2 (two) times daily.      Marland Kitchen ALPRAZolam (XANAX) 0.5 MG tablet Take 1 tablet (0.5 mg total) by mouth 2 (two) times daily as needed for anxiety.  30 tablet  0  . amiodarone (PACERONE) 200 MG tablet Take 1 tablet (200 mg total) by mouth daily.  30 tablet  1  . apixaban (ELIQUIS) 5 MG TABS tablet Take 1 tablet (5 mg total) by mouth 2 (two) times daily.  60 tablet  1  . digoxin (LANOXIN) 0.125 MG tablet Take 1 tablet (0.125 mg total) by mouth daily.  30 tablet  1  . diltiazem (CARDIZEM CD) 180 MG 24 hr capsule Take 180  mg by mouth daily.      . feeding supplement, ENSURE COMPLETE, (ENSURE COMPLETE) LIQD Take 237 mLs by mouth 2 (two) times daily between meals.  60 Bottle  6  . folic acid (FOLVITE) 1 MG tablet Take 1 tablet (1 mg total) by mouth daily.  30 tablet  1  . furosemide (LASIX) 40 MG tablet Take 40 mg by mouth daily.      . iron polysaccharides (NIFEREX) 150 MG capsule Take 1 capsule (150 mg total) by mouth daily.  30 capsule  1  . levalbuterol  (XOPENEX) 0.63 MG/3ML nebulizer solution Take 0.63 mg by nebulization every 4 (four) hours as needed for wheezing or shortness of breath.      . levothyroxine (SYNTHROID, LEVOTHROID) 25 MCG tablet Take 25 mcg by mouth daily before breakfast.      . loratadine (CLARITIN) 10 MG tablet Take 10 mg by mouth daily.      . metoprolol tartrate (LOPRESSOR) 25 MG tablet Take 25 mg by mouth 2 (two) times daily.      . Multiple Vitamin (MULTIVITAMIN WITH MINERALS) TABS tablet Take 1 tablet by mouth daily.      Marland Kitchen oxyCODONE (OXY IR/ROXICODONE) 5 MG immediate release tablet Take 1 tablet (5 mg total) by mouth as needed for severe pain (1 tablet every 4-6 hours as needed).  40 tablet  0  . pantoprazole (PROTONIX) 40 MG tablet Take 1 tablet (40 mg total) by mouth daily.  30 tablet  1  . potassium chloride SA (K-DUR,KLOR-CON) 20 MEQ tablet Take 1 tablet (20 mEq total) by mouth daily.  60 tablet  1  . thiamine 100 MG tablet Take 1 tablet (100 mg total) by mouth daily.  30 tablet  1  . diclofenac sodium (VOLTAREN) 1 % GEL Apply 4 g topically 4 (four) times daily as needed (for pain).      Marland Kitchen diphenoxylate-atropine (LOMOTIL) 2.5-0.025 MG per tablet Take 1 tablet by mouth 4 (four) times daily as needed for diarrhea or loose stools.      . docusate sodium (COLACE) 100 MG capsule Take 1 capsule (100 mg total) by mouth 2 (two) times daily. Continue this while taking narcotics to help with bowel movements  30 capsule  1  . morphine (MS CONTIN) 30 MG 12 hr tablet Take 1 tablet (30 mg total) by mouth every 12 (twelve) hours.  60 tablet  0  . mupirocin ointment (BACTROBAN) 2 % Apply 1 application topically 2 (two) times daily.      . prochlorperazine (COMPAZINE) 10 MG tablet        No current facility-administered medications for this visit.   Facility-Administered Medications Ordered in Other Visits  Medication Dose Route Frequency Provider Last Rate Last Dose  . CARBOplatin (PARAPLATIN) 450 mg in sodium chloride 0.9 % 250  mL chemo infusion  450 mg Intravenous Once Curt Bears, MD 590 mL/hr at 12/21/13 1650 450 mg at 12/21/13 1650    SURGICAL HISTORY:  Past Surgical History  Procedure Laterality Date  . Tonsillectomy    . Back surgery    . Femur im nail Right 06/22/2013    Procedure: RIGHT HIP GAMMA NAIL FIXATION   ;  Surgeon: Renette Butters, MD;  Location: [facility];  Service: Orthopedics;  Laterality: Right;  . Video bronchoscopy with endobronchial ultrasound N/A 06/22/2013    Procedure: VIDEO BRONCHOSCOPY WITH ENDOBRONCHIAL ULTRASOUND;  Surgeon: Doree Fudge, MD;  Location: [facility];  Service: Pulmonary;  Laterality: N/A;  REVIEW OF SYSTEMS:  Constitutional: positive for fatigue and malaise Eyes: negative Ears, nose, mouth, throat, and face: positive for earaches Respiratory: positive for cough and dyspnea on exertion Cardiovascular: negative Gastrointestinal: negative Genitourinary:negative Integument/breast: negative Hematologic/lymphatic: negative Musculoskeletal:positive for bone pain Neurological: positive for weakness Behavioral/Psych: positive for anxiety Endocrine: negative Allergic/Immunologic: negative   PHYSICAL EXAMINATION: General appearance: alert, cooperative, appears stated age and no distress Head: Normocephalic, without obvious abnormality, atraumatic Neck: no adenopathy, no carotid bruit, no JVD, supple, symmetrical, trachea midline and thyroid not enlarged, symmetric, no tenderness/mass/nodules Lymph nodes: Cervical, supraclavicular, and axillary nodes normal. Resp: clear to auscultation bilaterally Cardio: regular rate and rhythm, S1, S2 normal, no murmur, click, rub or gallop GI: soft, non-tender; bowel sounds normal; no masses,  no organomegaly Extremities: Patient continues to have pain related to her right hip stabilization surgery on 06/22/2013 as well as some right lower extremity edema Neurologic: Grossly normal Ears: TMs visible bilaterally without any  evidence of infection however there is a moderate amount of cerumen in the left ear canal  ECOG PERFORMANCE STATUS: 1 - Symptomatic but completely ambulatory  Blood pressure 91/58, pulse 95, temperature 97.9 F (36.6 C), temperature source Oral, resp. rate 18, height 5\' 3"  (1.6 m), weight 113 lb (51.256 kg), SpO2 98.00%.  LABORATORY DATA: Lab Results  Component Value Date   WBC 9.6 12/21/2013   HGB 9.7* 12/21/2013   HCT 29.3* 12/21/2013   MCV 99.7 12/21/2013   PLT 178 12/21/2013      Chemistry      Component Value Date/Time   NA 140 12/21/2013 1014   NA 131* 08/11/2013 1744   K 3.9 12/21/2013 1014   K 4.3 08/11/2013 1744   CL 90* 08/11/2013 1744   CO2 25 12/21/2013 1014   CO2 32 06/30/2013 1125   BUN 9.3 12/21/2013 1014   BUN 8 08/11/2013 1744   CREATININE 0.8 12/21/2013 1014   CREATININE 0.60 08/11/2013 1744      Component Value Date/Time   CALCIUM 9.7 12/21/2013 1014   CALCIUM 8.8 06/30/2013 1125   ALKPHOS 104 12/21/2013 1014   ALKPHOS 159* 06/30/2013 1125   AST 17 12/21/2013 1014   AST 31 06/30/2013 1125   ALT 22 12/21/2013 1014   ALT 43* 06/30/2013 1125   BILITOT 0.33 12/21/2013 1014   BILITOT 1.1 06/30/2013 1125       RADIOGRAPHIC STUDIES: Ct Chest W Contrast  11/02/2013   CLINICAL DATA:  Subsequent treatment strategy for lung cancer diagnosis in January 2015. Chemotherapy radiation therapy complete. Non-small cell lung cancer of the right lung.  EXAM: CT CHEST, ABDOMEN, AND PELVIS WITH CONTRAST  TECHNIQUE: Multidetector CT imaging of the chest, abdomen and pelvis was performed following the standard protocol during bolus administration of intravenous contrast.  CONTRAST:  131mL OMNIPAQUE IOHEXOL 300 MG/ML  SOLN  COMPARISON:  CT 08/11/2013, 08/04/2013  FINDINGS: CT CHEST FINDINGS  No axillary or supraclavicular lymphadenopathy. The perihilar mass in the right upper lobe decreased in volume measuring 2.0 x 2.6 cm (image 21, series 2) decreased from 6.1 x 4.9 cm on most recent CT (08/11/2013). No discrete  adenopathy. There is thickening in the right suprahilar location which is decreased from prior.  There is no central pulmonary embolism. There is decreased compression upon the right main pulmonary artery compared to prior. No pericardial fluid.  Review of the lung parenchyma is trace noted new pulmonary nodules.  CT ABDOMEN AND PELVIS FINDINGS  There is enhancing focus in the superior right hepatic  lobe (image 34, series 2) which suggests a benign vascular phenomena. There is a lesion anterior aspect of the central left hepatic lobe measuring 2.6 cm (image 55, series 2) which we is decreased from comparison and demonstrated to be at metastasis on comparison PET exam. Central right hepatic metastasis measuring 15 mm decreased from 18 mm on prior.  Gallbladder, pancreas, spleen, adrenal glands, and kidneys are normal.  Stomach, small bowel, appendix, cecum normal. The colon rectosigmoid colon are normal.  Abdominal or is normal caliber. No retroperitoneal periportal lymphadenopathy. Peritoneum peritoneal disease. Post hysterectomy anatomy. The bladder is normal. No pelvic lymphadenopathy.  There is a internal fixation of the right hip. In pre sclerosis of the right C greater lesion.  IMPRESSION: 1. Marked decrease in volume of right upper lobe paramediastinal mass. 2. Decrease in volume of hepatic metastasis. 3. Increased sclerosis of the right sacral ale lesion could indicate treatment response. This lesion may be prone to pathologic fracture. 4. No new or progressive disease.   Electronically Signed   By: Suzy Bouchard M.D.   On: 11/02/2013 17:11   Ct Abdomen Pelvis W Contrast  11/02/2013   CLINICAL DATA:  Subsequent treatment strategy for lung cancer diagnosis in January 2015. Chemotherapy radiation therapy complete. Non-small cell lung cancer of the right lung.  EXAM: CT CHEST, ABDOMEN, AND PELVIS WITH CONTRAST  TECHNIQUE: Multidetector CT imaging of the chest, abdomen and pelvis was performed following the  standard protocol during bolus administration of intravenous contrast.  CONTRAST:  12mL OMNIPAQUE IOHEXOL 300 MG/ML  SOLN  COMPARISON:  CT 08/11/2013, 08/04/2013  FINDINGS: CT CHEST FINDINGS  No axillary or supraclavicular lymphadenopathy. The perihilar mass in the right upper lobe decreased in volume measuring 2.0 x 2.6 cm (image 21, series 2) decreased from 6.1 x 4.9 cm on most recent CT (08/11/2013). No discrete adenopathy. There is thickening in the right suprahilar location which is decreased from prior.  There is no central pulmonary embolism. There is decreased compression upon the right main pulmonary artery compared to prior. No pericardial fluid.  Review of the lung parenchyma is trace noted new pulmonary nodules.  CT ABDOMEN AND PELVIS FINDINGS  There is enhancing focus in the superior right hepatic lobe (image 34, series 2) which suggests a benign vascular phenomena. There is a lesion anterior aspect of the central left hepatic lobe measuring 2.6 cm (image 55, series 2) which we is decreased from comparison and demonstrated to be at metastasis on comparison PET exam. Central right hepatic metastasis measuring 15 mm decreased from 18 mm on prior.  Gallbladder, pancreas, spleen, adrenal glands, and kidneys are normal.  Stomach, small bowel, appendix, cecum normal. The colon rectosigmoid colon are normal.  Abdominal or is normal caliber. No retroperitoneal periportal lymphadenopathy. Peritoneum peritoneal disease. Post hysterectomy anatomy. The bladder is normal. No pelvic lymphadenopathy.  There is a internal fixation of the right hip. In pre sclerosis of the right C greater lesion.  IMPRESSION: 1. Marked decrease in volume of right upper lobe paramediastinal mass. 2. Decrease in volume of hepatic metastasis. 3. Increased sclerosis of the right sacral ale lesion could indicate treatment response. This lesion may be prone to pathologic fracture. 4. No new or progressive disease.   Electronically Signed    By: Suzy Bouchard M.D.   On: 11/02/2013 17:11    ASSESSMENT/PLAN:  This is a very pleasant 63 years old white female with recently diagnosed with stage IV non-small cell lung cancer with recent PET scan showing liver  and bone metastasis. She has a large right upper lobe lung mass with direct mediastinal invasion. The patient completed a course of concurrent chemoradiation to the right upper lobe lung mass and mediastinum for 3 weeks.  She is currently undergoing systemic chemotherapy with carboplatin and paclitaxel status post 5 cycles. She is tolerating her treatment fairly well with no significant adverse effects. Patient was discussed with and also seen by Dr. Julien Nordmann. Her last restaging CT scan revealed marked decrease in volume of right upper lobe paramediastinal mass as well as decrease in volume of the hepatic metastasis. There is an increase sclerosis of the right sacral lesion with comment that this lesion may be prone to pathologic fracture. There was no new or progressive disease. She'll followup with her primary care physician regarding her cerumen issue. She will proceed with cycle #6 today as scheduled. She will continue with weekly labs as scheduled and followup in 3 weeks with a restaging CT scan of the chest, abdomen and pelvis contrast to reevaluate her disease.  Both the patient and her caregiver, Stephanie Oliver, who can be reached at [phone] will be contacted regarding appointment dates and times.  She was advised to call immediately if she has any concerning symptoms in the interval.  Disclaimer: This note was dictated with voice recognition software. Similar sounding words can inadvertently be transcribed and may not be corrected upon review. Carlton Adam, PA-C 12/21/2013 Carlton Adam, PA-C 12/21/2013  ADDENDUM:  Hematology/Oncology Attending:  I had a face to face encounter with the patient today. I recommended her care plan. This is a very pleasant 63 years  old white female with metastatic non-small cell lung cancer currently undergoing systemic chemotherapy with carboplatin and paclitaxel status post 5 cycles. She is tolerating her treatment well except for the fatigue and persistent peripheral neuropathy. She is currently on reduced dose of paclitaxel and no worsening of the peripheral neuropathy since the dose reduction. I recommended for her to proceed with cycle #6 today as scheduled. She will come back for followup visit in 3 weeks after repeating CT scan of the chest, abdomen and pelvis for restaging of her disease. She was advised to call immediately if she has any concerning symptoms in the interval. Eilleen Kempf., MD 12/22/2013

## 2013-12-22 ENCOUNTER — Telehealth: Payer: Self-pay | Admitting: Internal Medicine

## 2013-12-22 ENCOUNTER — Ambulatory Visit (HOSPITAL_BASED_OUTPATIENT_CLINIC_OR_DEPARTMENT_OTHER)

## 2013-12-22 VITALS — BP 93/57 | HR 94 | Temp 98.0°F

## 2013-12-22 DIAGNOSIS — Z5189 Encounter for other specified aftercare: Secondary | ICD-10-CM

## 2013-12-22 DIAGNOSIS — C3410 Malignant neoplasm of upper lobe, unspecified bronchus or lung: Secondary | ICD-10-CM

## 2013-12-22 DIAGNOSIS — C3491 Malignant neoplasm of unspecified part of right bronchus or lung: Secondary | ICD-10-CM

## 2013-12-22 DIAGNOSIS — C7951 Secondary malignant neoplasm of bone: Secondary | ICD-10-CM

## 2013-12-22 DIAGNOSIS — C787 Secondary malignant neoplasm of liver and intrahepatic bile duct: Secondary | ICD-10-CM

## 2013-12-22 DIAGNOSIS — C7952 Secondary malignant neoplasm of bone marrow: Secondary | ICD-10-CM

## 2013-12-22 MED ORDER — PEGFILGRASTIM INJECTION 6 MG/0.6ML
6.0000 mg | Freq: Once | SUBCUTANEOUS | Status: AC
  Administered 2013-12-22: 6 mg via SUBCUTANEOUS
  Filled 2013-12-22: qty 0.6

## 2013-12-22 NOTE — Telephone Encounter (Signed)
gv and printed appt sched adn avs for pt for July adn Aug....sed added tx....gv pt barium

## 2013-12-22 NOTE — Patient Instructions (Signed)
Continue weekly labs as scheduled Followup with Dr. Julien Nordmann in 3 weeks with a restaging CT scan of your chest, abdomen and pelvis to reevaluate your disease.

## 2013-12-28 ENCOUNTER — Other Ambulatory Visit (HOSPITAL_BASED_OUTPATIENT_CLINIC_OR_DEPARTMENT_OTHER)

## 2013-12-28 DIAGNOSIS — C3491 Malignant neoplasm of unspecified part of right bronchus or lung: Secondary | ICD-10-CM

## 2013-12-28 DIAGNOSIS — C3410 Malignant neoplasm of upper lobe, unspecified bronchus or lung: Secondary | ICD-10-CM

## 2013-12-28 LAB — CBC WITH DIFFERENTIAL/PLATELET
BASO%: 0 % (ref 0.0–2.0)
Basophils Absolute: 0 10*3/uL (ref 0.0–0.1)
EOS%: 0.6 % (ref 0.0–7.0)
Eosinophils Absolute: 0 10*3/uL (ref 0.0–0.5)
HCT: 26.5 % — ABNORMAL LOW (ref 34.8–46.6)
HEMOGLOBIN: 8.7 g/dL — AB (ref 11.6–15.9)
LYMPH%: 19.2 % (ref 14.0–49.7)
MCH: 32.2 pg (ref 25.1–34.0)
MCHC: 32.8 g/dL (ref 31.5–36.0)
MCV: 98.1 fL (ref 79.5–101.0)
MONO#: 0.2 10*3/uL (ref 0.1–0.9)
MONO%: 4.7 % (ref 0.0–14.0)
NEUT#: 2.7 10*3/uL (ref 1.5–6.5)
NEUT%: 75.5 % (ref 38.4–76.8)
Platelets: 72 10*3/uL — ABNORMAL LOW (ref 145–400)
RBC: 2.7 10*6/uL — ABNORMAL LOW (ref 3.70–5.45)
RDW: 15.7 % — AB (ref 11.2–14.5)
WBC: 3.6 10*3/uL — ABNORMAL LOW (ref 3.9–10.3)
lymph#: 0.7 10*3/uL — ABNORMAL LOW (ref 0.9–3.3)

## 2013-12-28 LAB — COMPREHENSIVE METABOLIC PANEL (CC13)
ALBUMIN: 3.4 g/dL — AB (ref 3.5–5.0)
ALK PHOS: 106 U/L (ref 40–150)
ALT: 24 U/L (ref 0–55)
AST: 18 U/L (ref 5–34)
Anion Gap: 10 mEq/L (ref 3–11)
BILIRUBIN TOTAL: 0.53 mg/dL (ref 0.20–1.20)
BUN: 16.6 mg/dL (ref 7.0–26.0)
CO2: 27 mEq/L (ref 22–29)
Calcium: 9.4 mg/dL (ref 8.4–10.4)
Chloride: 102 mEq/L (ref 98–109)
Creatinine: 0.7 mg/dL (ref 0.6–1.1)
Glucose: 135 mg/dl (ref 70–140)
POTASSIUM: 3.7 meq/L (ref 3.5–5.1)
Sodium: 139 mEq/L (ref 136–145)
Total Protein: 6.6 g/dL (ref 6.4–8.3)

## 2013-12-30 ENCOUNTER — Encounter: Payer: Self-pay | Admitting: Nutrition

## 2013-12-30 ENCOUNTER — Encounter: Admitting: Nutrition

## 2013-12-30 NOTE — Progress Notes (Signed)
Patient's caregiver called requesting information on high fiber diet.  States patient has been constipated.  They have been adding more fiber to her diet and have her on laxatives and stool softeners, but would like a list of high fiber foods.  Will mail constipation Fact sheet, which includes list of high fiber foods.  Will attempt to schedule followup with patient when she is here chemotherapy.

## 2014-01-03 ENCOUNTER — Other Ambulatory Visit: Payer: Self-pay | Admitting: Medical Oncology

## 2014-01-03 DIAGNOSIS — C3491 Malignant neoplasm of unspecified part of right bronchus or lung: Secondary | ICD-10-CM

## 2014-01-03 MED ORDER — OXYCODONE HCL 5 MG PO TABS: 5.0000 mg | ORAL_TABLET | ORAL | Status: DC | PRN

## 2014-01-03 NOTE — Telephone Encounter (Signed)
rx locked in injection for pick up later in week.

## 2014-01-04 ENCOUNTER — Other Ambulatory Visit (HOSPITAL_BASED_OUTPATIENT_CLINIC_OR_DEPARTMENT_OTHER)

## 2014-01-04 ENCOUNTER — Other Ambulatory Visit

## 2014-01-04 ENCOUNTER — Telehealth: Payer: Self-pay | Admitting: Internal Medicine

## 2014-01-04 DIAGNOSIS — C3410 Malignant neoplasm of upper lobe, unspecified bronchus or lung: Secondary | ICD-10-CM

## 2014-01-04 DIAGNOSIS — C3491 Malignant neoplasm of unspecified part of right bronchus or lung: Secondary | ICD-10-CM

## 2014-01-04 LAB — COMPREHENSIVE METABOLIC PANEL (CC13)
ALK PHOS: 102 U/L (ref 40–150)
ALT: 15 U/L (ref 0–55)
AST: 12 U/L (ref 5–34)
Albumin: 3 g/dL — ABNORMAL LOW (ref 3.5–5.0)
Anion Gap: 10 mEq/L (ref 3–11)
BILIRUBIN TOTAL: 0.37 mg/dL (ref 0.20–1.20)
BUN: 10.4 mg/dL (ref 7.0–26.0)
CO2: 24 mEq/L (ref 22–29)
CREATININE: 0.7 mg/dL (ref 0.6–1.1)
Calcium: 9 mg/dL (ref 8.4–10.4)
Chloride: 101 mEq/L (ref 98–109)
GLUCOSE: 124 mg/dL (ref 70–140)
Potassium: 3.6 mEq/L (ref 3.5–5.1)
SODIUM: 135 meq/L — AB (ref 136–145)
Total Protein: 6.3 g/dL — ABNORMAL LOW (ref 6.4–8.3)

## 2014-01-04 LAB — CBC WITH DIFFERENTIAL/PLATELET
BASO%: 0 % (ref 0.0–2.0)
Basophils Absolute: 0 10*3/uL (ref 0.0–0.1)
EOS ABS: 0 10*3/uL (ref 0.0–0.5)
EOS%: 0.3 % (ref 0.0–7.0)
HEMATOCRIT: 24.3 % — AB (ref 34.8–46.6)
HGB: 8.1 g/dL — ABNORMAL LOW (ref 11.6–15.9)
LYMPH%: 13.2 % — AB (ref 14.0–49.7)
MCH: 32.4 pg (ref 25.1–34.0)
MCHC: 33.3 g/dL (ref 31.5–36.0)
MCV: 97.2 fL (ref 79.5–101.0)
MONO#: 1.4 10*3/uL — AB (ref 0.1–0.9)
MONO%: 19.3 % — AB (ref 0.0–14.0)
NEUT#: 5 10*3/uL (ref 1.5–6.5)
NEUT%: 67.2 % (ref 38.4–76.8)
PLATELETS: 70 10*3/uL — AB (ref 145–400)
RBC: 2.5 10*6/uL — AB (ref 3.70–5.45)
RDW: 16.5 % — ABNORMAL HIGH (ref 11.2–14.5)
WBC: 7.4 10*3/uL (ref 3.9–10.3)
lymph#: 1 10*3/uL (ref 0.9–3.3)

## 2014-01-04 NOTE — Telephone Encounter (Signed)
pt called to r/s lab today...done...pt ok adn aware

## 2014-01-07 ENCOUNTER — Telehealth: Payer: Self-pay | Admitting: Medical Oncology

## 2014-01-07 ENCOUNTER — Ambulatory Visit (HOSPITAL_COMMUNITY)
Admission: RE | Admit: 2014-01-07 | Discharge: 2014-01-07 | Disposition: A | Discharge status: Home / Self Care | Source: Ambulatory Visit | Attending: Physician Assistant | Admitting: Physician Assistant

## 2014-01-07 DIAGNOSIS — C3490 Malignant neoplasm of unspecified part of unspecified bronchus or lung: Secondary | ICD-10-CM | POA: Insufficient documentation

## 2014-01-07 DIAGNOSIS — C7951 Secondary malignant neoplasm of bone: Secondary | ICD-10-CM | POA: Insufficient documentation

## 2014-01-07 DIAGNOSIS — C3491 Malignant neoplasm of unspecified part of right bronchus or lung: Secondary | ICD-10-CM

## 2014-01-07 DIAGNOSIS — C7952 Secondary malignant neoplasm of bone marrow: Secondary | ICD-10-CM

## 2014-01-07 DIAGNOSIS — J8410 Pulmonary fibrosis, unspecified: Secondary | ICD-10-CM | POA: Insufficient documentation

## 2014-01-07 DIAGNOSIS — C787 Secondary malignant neoplasm of liver and intrahepatic bile duct: Secondary | ICD-10-CM | POA: Insufficient documentation

## 2014-01-07 MED ORDER — IOHEXOL 300 MG/ML  SOLN
100.0000 mL | Freq: Once | INTRAMUSCULAR | Status: AC | PRN
  Administered 2014-01-07: 100 mL via INTRAVENOUS

## 2014-01-07 NOTE — Telephone Encounter (Signed)
requesting FL-2 for pt to get admitted to rehab for PT /OT.  She was busy getting [place] ready for Ct scan and will come to [facility] to see me later.

## 2014-01-07 NOTE — Telephone Encounter (Signed)
Lauren and I met with Danton Clap , [facility] , and she was given list of facilities to review and talk to pt insurance for skilled nursing if needed later.

## 2014-01-11 ENCOUNTER — Other Ambulatory Visit (HOSPITAL_BASED_OUTPATIENT_CLINIC_OR_DEPARTMENT_OTHER)

## 2014-01-11 ENCOUNTER — Ambulatory Visit

## 2014-01-11 DIAGNOSIS — C3410 Malignant neoplasm of upper lobe, unspecified bronchus or lung: Secondary | ICD-10-CM

## 2014-01-11 DIAGNOSIS — C3491 Malignant neoplasm of unspecified part of right bronchus or lung: Secondary | ICD-10-CM

## 2014-01-11 DIAGNOSIS — C7952 Secondary malignant neoplasm of bone marrow: Secondary | ICD-10-CM

## 2014-01-11 DIAGNOSIS — C787 Secondary malignant neoplasm of liver and intrahepatic bile duct: Secondary | ICD-10-CM

## 2014-01-11 DIAGNOSIS — C7951 Secondary malignant neoplasm of bone: Secondary | ICD-10-CM

## 2014-01-11 LAB — CBC WITH DIFFERENTIAL/PLATELET
BASO%: 0.6 % (ref 0.0–2.0)
Basophils Absolute: 0 10*3/uL (ref 0.0–0.1)
EOS ABS: 0 10*3/uL (ref 0.0–0.5)
EOS%: 0.2 % (ref 0.0–7.0)
HCT: 28.6 % — ABNORMAL LOW (ref 34.8–46.6)
HGB: 9.4 g/dL — ABNORMAL LOW (ref 11.6–15.9)
LYMPH%: 9.9 % — ABNORMAL LOW (ref 14.0–49.7)
MCH: 32.8 pg (ref 25.1–34.0)
MCHC: 33 g/dL (ref 31.5–36.0)
MCV: 99.3 fL (ref 79.5–101.0)
MONO#: 1 10*3/uL — ABNORMAL HIGH (ref 0.1–0.9)
MONO%: 13.3 % (ref 0.0–14.0)
NEUT%: 76 % (ref 38.4–76.8)
NEUTROS ABS: 5.5 10*3/uL (ref 1.5–6.5)
Platelets: 127 10*3/uL — ABNORMAL LOW (ref 145–400)
RBC: 2.87 10*6/uL — AB (ref 3.70–5.45)
RDW: 16.7 % — ABNORMAL HIGH (ref 11.2–14.5)
WBC: 7.3 10*3/uL (ref 3.9–10.3)
lymph#: 0.7 10*3/uL — ABNORMAL LOW (ref 0.9–3.3)

## 2014-01-11 LAB — COMPREHENSIVE METABOLIC PANEL (CC13)
ALBUMIN: 3.2 g/dL — AB (ref 3.5–5.0)
ALK PHOS: 105 U/L (ref 40–150)
ALT: 16 U/L (ref 0–55)
AST: 16 U/L (ref 5–34)
Anion Gap: 13 mEq/L — ABNORMAL HIGH (ref 3–11)
BUN: 11.8 mg/dL (ref 7.0–26.0)
CO2: 27 mEq/L (ref 22–29)
Calcium: 9.9 mg/dL (ref 8.4–10.4)
Chloride: 98 mEq/L (ref 98–109)
Creatinine: 0.7 mg/dL (ref 0.6–1.1)
Glucose: 122 mg/dl (ref 70–140)
Potassium: 3.5 mEq/L (ref 3.5–5.1)
SODIUM: 138 meq/L (ref 136–145)
TOTAL PROTEIN: 7.3 g/dL (ref 6.4–8.3)
Total Bilirubin: 0.41 mg/dL (ref 0.20–1.20)

## 2014-01-12 ENCOUNTER — Telehealth: Payer: Self-pay | Admitting: Internal Medicine

## 2014-01-12 ENCOUNTER — Ambulatory Visit

## 2014-01-12 ENCOUNTER — Ambulatory Visit (HOSPITAL_BASED_OUTPATIENT_CLINIC_OR_DEPARTMENT_OTHER): Admitting: Internal Medicine

## 2014-01-12 ENCOUNTER — Ambulatory Visit: Admitting: Nutrition

## 2014-01-12 ENCOUNTER — Encounter: Payer: Self-pay | Admitting: Internal Medicine

## 2014-01-12 VITALS — BP 97/53 | HR 104 | Temp 98.1°F | Resp 18 | Ht 63.0 in | Wt 110.4 lb

## 2014-01-12 DIAGNOSIS — C3491 Malignant neoplasm of unspecified part of right bronchus or lung: Secondary | ICD-10-CM

## 2014-01-12 DIAGNOSIS — C787 Secondary malignant neoplasm of liver and intrahepatic bile duct: Secondary | ICD-10-CM

## 2014-01-12 DIAGNOSIS — C7952 Secondary malignant neoplasm of bone marrow: Secondary | ICD-10-CM

## 2014-01-12 DIAGNOSIS — C3410 Malignant neoplasm of upper lobe, unspecified bronchus or lung: Secondary | ICD-10-CM

## 2014-01-12 DIAGNOSIS — C7951 Secondary malignant neoplasm of bone: Secondary | ICD-10-CM
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
## 2014-01-12 NOTE — Telephone Encounter (Signed)
gv adn rpinted appt sched and avs for pt for Sept...gv pt barium

## 2014-01-12 NOTE — Progress Notes (Signed)
Spoke briefly with patient today after M.D. appointment.  Patient's weight has declined and was documented as 110.4 pounds July 29.  Patient reports constipation continues.  She is only tolerating 2 Ensure Plus a day.  She would like to try prune juice to help with constipation.  Patient states she is going to have 2 month break from treatment.  Nutrition diagnosis: Unintended weight loss related to poor appetite and systemic chemotherapy for non-small cell lung cancer as evidenced by 8 pound weight loss in 6 weeks.  Intervention: Patient educated on strategies for improving constipation.  Encouraged patient to increase Ensure Plus to 3 times a day if possible.  Encouraged plenty of fluid intake and slow addition of fiber-containing foods.  Questions answered.  Teach back method used.  Monitoring, evaluation, goals: Patient will tolerate adequate calories and protein to minimize further weight loss for improved quality of life.  Next visit: To be scheduled as needed.  Patient and caregiver have my telephone number for questions.  **Disclaimer: This note was dictated with voice recognition software. Similar sounding words can inadvertently be transcribed and this note may contain transcription errors which may not have been corrected upon publication of note.**

## 2014-01-13 ENCOUNTER — Ambulatory Visit

## 2014-01-18 ENCOUNTER — Other Ambulatory Visit

## 2014-01-25 ENCOUNTER — Other Ambulatory Visit: Payer: Self-pay | Admitting: Medical Oncology

## 2014-01-25 DIAGNOSIS — C3491 Malignant neoplasm of unspecified part of right bronchus or lung: Secondary | ICD-10-CM

## 2014-01-25 MED ORDER — OXYCODONE HCL 5 MG PO TABS: 5.0000 mg | ORAL_TABLET | ORAL | Status: DC | PRN

## 2014-01-25 NOTE — Telephone Encounter (Signed)
Refill done and locked in injection room.

## 2014-02-03 ENCOUNTER — Telehealth: Payer: Self-pay | Admitting: Medical Oncology

## 2014-02-03 DIAGNOSIS — G629 Polyneuropathy, unspecified: Secondary | ICD-10-CM

## 2014-02-03 MED ORDER — GABAPENTIN 100 MG PO CAPS: 100.0000 mg | ORAL_CAPSULE | Freq: Three times a day (TID) | ORAL | Status: AC

## 2014-02-03 NOTE — Telephone Encounter (Signed)
Denice Paradise -sister is in town for a few days and they are considering taking Frenchie to [place] and need information about plan of care. She said pt has a lot of peripheral neuropathy pain per PT and asked for Lyrica or neurontin. She is going to ask [facility] again about treatment . I left a message for Denice Paradise that Dr Julien Nordmann ordered neurontin. Note to [place].

## 2014-02-04 ENCOUNTER — Telehealth: Payer: Self-pay | Admitting: *Deleted

## 2014-02-04 NOTE — Telephone Encounter (Signed)
Called Ambrose Mantle back, no answer, left message with information and asked for a call back with any questions.  SLJ

## 2014-02-04 NOTE — Telephone Encounter (Signed)
Message copied by Britt Bottom on Fri Feb 04, 2014  4:31 PM ------      Message from: Ardeen Garland      Created: Fri Feb 04, 2014  9:38 AM                   ----- Message -----         From: Curt Bears, MD         Sent: 02/04/2014   8:51 AM           To: Ardeen Garland, RN            Yes.      ----- Message -----         From: Ardeen Garland, RN         Sent: 02/03/2014   4:16 PM           To: Anders Grant, RN, #            Denice Paradise -sister is in town for a few days and they are considering taking [place] to [place] and need information about plan of care. I told her you offered Starkeisha to  take a 2 month break or nivol or other chemo and that Saralee wanted to take a break . Can Judithann take the chemo or immun now if she wanted?            Call Jo back.       ------

## 2014-02-07 NOTE — Progress Notes (Unsigned)
Counselor contacted client by phone to schedule intake session. Client indicated that she is unable to come to counseling. Counselor thanked client and invited her to schedule an appointment in the future, if she is able.   [facility] Counseling Intern [phone]

## 2014-02-11 ENCOUNTER — Telehealth: Payer: Self-pay | Admitting: Medical Oncology

## 2014-02-11 NOTE — Telephone Encounter (Signed)
I left a message for Stephanie Oliver to call back to answer questions about Stephanie Oliver's abilities to perform ADLs so the form for housebound  status can be completed

## 2014-02-16 ENCOUNTER — Other Ambulatory Visit: Payer: Self-pay | Admitting: Medical Oncology

## 2014-02-16 DIAGNOSIS — C3491 Malignant neoplasm of unspecified part of right bronchus or lung: Secondary | ICD-10-CM

## 2014-02-16 MED ORDER — OXYCODONE HCL 5 MG PO TABS: 5.0000 mg | ORAL_TABLET | ORAL | Status: DC | PRN

## 2014-02-16 NOTE — Telephone Encounter (Signed)
Oxycodone refill sent to [facility] and locked in injection room.

## 2014-02-22 ENCOUNTER — Encounter (HOSPITAL_COMMUNITY): Payer: Self-pay | Admitting: Emergency Medicine

## 2014-02-22 ENCOUNTER — Telehealth: Payer: Self-pay | Admitting: Medical Oncology

## 2014-02-22 ENCOUNTER — Inpatient Hospital Stay (HOSPITAL_COMMUNITY)
Admission: EM | Admit: 2014-02-22 | Discharge: 2014-03-01 | DRG: 542 | Disposition: A | Discharge status: Skilled Nursing Facility | Attending: Internal Medicine | Admitting: Internal Medicine

## 2014-02-22 ENCOUNTER — Inpatient Hospital Stay (HOSPITAL_COMMUNITY)

## 2014-02-22 ENCOUNTER — Emergency Department (HOSPITAL_COMMUNITY)

## 2014-02-22 DIAGNOSIS — C787 Secondary malignant neoplasm of liver and intrahepatic bile duct: Secondary | ICD-10-CM | POA: Diagnosis present

## 2014-02-22 DIAGNOSIS — M48061 Spinal stenosis, lumbar region without neurogenic claudication: Secondary | ICD-10-CM | POA: Diagnosis present

## 2014-02-22 DIAGNOSIS — Z888 Allergy status to other drugs, medicaments and biological substances status: Secondary | ICD-10-CM | POA: Diagnosis not present

## 2014-02-22 DIAGNOSIS — M216X9 Other acquired deformities of unspecified foot: Secondary | ICD-10-CM | POA: Diagnosis present

## 2014-02-22 DIAGNOSIS — L89609 Pressure ulcer of unspecified heel, unspecified stage: Secondary | ICD-10-CM | POA: Diagnosis present

## 2014-02-22 DIAGNOSIS — R64 Cachexia: Secondary | ICD-10-CM | POA: Diagnosis present

## 2014-02-22 DIAGNOSIS — Z8249 Family history of ischemic heart disease and other diseases of the circulatory system: Secondary | ICD-10-CM

## 2014-02-22 DIAGNOSIS — Z923 Personal history of irradiation: Secondary | ICD-10-CM | POA: Diagnosis not present

## 2014-02-22 DIAGNOSIS — L8990 Pressure ulcer of unspecified site, unspecified stage: Secondary | ICD-10-CM | POA: Diagnosis present

## 2014-02-22 DIAGNOSIS — I480 Paroxysmal atrial fibrillation: Secondary | ICD-10-CM | POA: Diagnosis present

## 2014-02-22 DIAGNOSIS — C7951 Secondary malignant neoplasm of bone: Principal | ICD-10-CM | POA: Diagnosis present

## 2014-02-22 DIAGNOSIS — F603 Borderline personality disorder: Secondary | ICD-10-CM | POA: Diagnosis present

## 2014-02-22 DIAGNOSIS — G609 Hereditary and idiopathic neuropathy, unspecified: Secondary | ICD-10-CM

## 2014-02-22 DIAGNOSIS — F319 Bipolar disorder, unspecified: Secondary | ICD-10-CM | POA: Diagnosis present

## 2014-02-22 DIAGNOSIS — R29898 Other symptoms and signs involving the musculoskeletal system: Secondary | ICD-10-CM | POA: Diagnosis present

## 2014-02-22 DIAGNOSIS — M8448XS Pathological fracture, other site, sequela: Secondary | ICD-10-CM

## 2014-02-22 DIAGNOSIS — C3491 Malignant neoplasm of unspecified part of right bronchus or lung: Secondary | ICD-10-CM | POA: Diagnosis present

## 2014-02-22 DIAGNOSIS — F410 Panic disorder [episodic paroxysmal anxiety] without agoraphobia: Secondary | ICD-10-CM | POA: Diagnosis present

## 2014-02-22 DIAGNOSIS — C7952 Secondary malignant neoplasm of bone marrow: Secondary | ICD-10-CM | POA: Diagnosis present

## 2014-02-22 DIAGNOSIS — R599 Enlarged lymph nodes, unspecified: Secondary | ICD-10-CM | POA: Diagnosis present

## 2014-02-22 DIAGNOSIS — Z79899 Other long term (current) drug therapy: Secondary | ICD-10-CM | POA: Diagnosis not present

## 2014-02-22 DIAGNOSIS — M810 Age-related osteoporosis without current pathological fracture: Secondary | ICD-10-CM | POA: Diagnosis present

## 2014-02-22 DIAGNOSIS — Z85118 Personal history of other malignant neoplasm of bronchus and lung: Secondary | ICD-10-CM

## 2014-02-22 DIAGNOSIS — M8448XD Pathological fracture, other site, subsequent encounter for fracture with routine healing: Secondary | ICD-10-CM

## 2014-02-22 DIAGNOSIS — IMO0002 Reserved for concepts with insufficient information to code with codable children: Secondary | ICD-10-CM

## 2014-02-22 DIAGNOSIS — Z639 Problem related to primary support group, unspecified: Secondary | ICD-10-CM

## 2014-02-22 DIAGNOSIS — M21372 Foot drop, left foot: Secondary | ICD-10-CM

## 2014-02-22 DIAGNOSIS — G620 Drug-induced polyneuropathy: Secondary | ICD-10-CM | POA: Diagnosis present

## 2014-02-22 DIAGNOSIS — M21379 Foot drop, unspecified foot: Secondary | ICD-10-CM | POA: Diagnosis present

## 2014-02-22 DIAGNOSIS — N390 Urinary tract infection, site not specified: Secondary | ICD-10-CM | POA: Diagnosis present

## 2014-02-22 DIAGNOSIS — Z96649 Presence of unspecified artificial hip joint: Secondary | ICD-10-CM

## 2014-02-22 DIAGNOSIS — I10 Essential (primary) hypertension: Secondary | ICD-10-CM | POA: Diagnosis present

## 2014-02-22 DIAGNOSIS — T451X5A Adverse effect of antineoplastic and immunosuppressive drugs, initial encounter: Secondary | ICD-10-CM | POA: Diagnosis present

## 2014-02-22 DIAGNOSIS — E039 Hypothyroidism, unspecified: Secondary | ICD-10-CM

## 2014-02-22 DIAGNOSIS — I959 Hypotension, unspecified: Secondary | ICD-10-CM | POA: Diagnosis not present

## 2014-02-22 DIAGNOSIS — M5137 Other intervertebral disc degeneration, lumbosacral region: Secondary | ICD-10-CM | POA: Diagnosis present

## 2014-02-22 DIAGNOSIS — G629 Polyneuropathy, unspecified: Secondary | ICD-10-CM | POA: Diagnosis present

## 2014-02-22 DIAGNOSIS — Z880 Allergy status to penicillin: Secondary | ICD-10-CM | POA: Diagnosis not present

## 2014-02-22 DIAGNOSIS — Z9221 Personal history of antineoplastic chemotherapy: Secondary | ICD-10-CM | POA: Diagnosis not present

## 2014-02-22 DIAGNOSIS — E43 Unspecified severe protein-calorie malnutrition: Secondary | ICD-10-CM | POA: Diagnosis present

## 2014-02-22 DIAGNOSIS — C3490 Malignant neoplasm of unspecified part of unspecified bronchus or lung: Secondary | ICD-10-CM | POA: Diagnosis present

## 2014-02-22 DIAGNOSIS — Z515 Encounter for palliative care: Secondary | ICD-10-CM

## 2014-02-22 DIAGNOSIS — S72141A Displaced intertrochanteric fracture of right femur, initial encounter for closed fracture: Secondary | ICD-10-CM | POA: Diagnosis present

## 2014-02-22 DIAGNOSIS — M21371 Foot drop, right foot: Secondary | ICD-10-CM

## 2014-02-22 DIAGNOSIS — E876 Hypokalemia: Secondary | ICD-10-CM | POA: Diagnosis present

## 2014-02-22 DIAGNOSIS — D649 Anemia, unspecified: Secondary | ICD-10-CM | POA: Diagnosis present

## 2014-02-22 DIAGNOSIS — I4891 Unspecified atrial fibrillation: Secondary | ICD-10-CM | POA: Diagnosis present

## 2014-02-22 DIAGNOSIS — Z8261 Family history of arthritis: Secondary | ICD-10-CM | POA: Diagnosis not present

## 2014-02-22 DIAGNOSIS — Z87891 Personal history of nicotine dependence: Secondary | ICD-10-CM | POA: Diagnosis not present

## 2014-02-22 DIAGNOSIS — M8448XA Pathological fracture, other site, initial encounter for fracture: Secondary | ICD-10-CM | POA: Diagnosis present

## 2014-02-22 DIAGNOSIS — M51379 Other intervertebral disc degeneration, lumbosacral region without mention of lumbar back pain or lower extremity pain: Secondary | ICD-10-CM | POA: Diagnosis present

## 2014-02-22 DIAGNOSIS — R590 Localized enlarged lymph nodes: Secondary | ICD-10-CM | POA: Diagnosis present

## 2014-02-22 DIAGNOSIS — M25551 Pain in right hip: Secondary | ICD-10-CM

## 2014-02-22 LAB — BASIC METABOLIC PANEL
ANION GAP: 14 (ref 5–15)
BUN: 11 mg/dL (ref 6–23)
CALCIUM: 9.9 mg/dL (ref 8.4–10.5)
CHLORIDE: 92 meq/L — AB (ref 96–112)
CO2: 32 mEq/L (ref 19–32)
CREATININE: 0.54 mg/dL (ref 0.50–1.10)
GFR calc Af Amer: >90 mL/min (ref >90)
GFR calc non Af Amer: >90 mL/min (ref >90)
Glucose, Bld: 86 mg/dL (ref 70–99)
Potassium: 3.7 mEq/L (ref 3.7–5.3)
Sodium: 138 mEq/L (ref 137–147)

## 2014-02-22 LAB — CBC
HCT: 31.5 % — ABNORMAL LOW (ref 36.0–46.0)
Hemoglobin: 10.3 g/dL — ABNORMAL LOW (ref 12.0–15.0)
MCH: 30.6 pg (ref 26.0–34.0)
MCHC: 32.7 g/dL (ref 30.0–36.0)
MCV: 93.5 fL (ref 78.0–100.0)
PLATELETS: 277 10*3/uL (ref 150–400)
RBC: 3.37 MIL/uL — ABNORMAL LOW (ref 3.87–5.11)
RDW: 15.1 % (ref 11.5–15.5)
WBC: 9.5 10*3/uL (ref 4.0–10.5)

## 2014-02-22 LAB — URINALYSIS, ROUTINE W REFLEX MICROSCOPIC
Bilirubin Urine: NEGATIVE
Glucose, UA: NEGATIVE mg/dL
Hgb urine dipstick: NEGATIVE
Ketones, ur: NEGATIVE mg/dL
Nitrite: NEGATIVE
PROTEIN: NEGATIVE mg/dL
Specific Gravity, Urine: 1.011 (ref 1.005–1.030)
UROBILINOGEN UA: 1 mg/dL (ref 0.0–1.0)
pH: 7.5 (ref 5.0–8.0)

## 2014-02-22 LAB — HEPATIC FUNCTION PANEL
ALT: 15 U/L (ref 0–35)
AST: 25 U/L (ref 0–37)
Albumin: 3.3 g/dL — ABNORMAL LOW (ref 3.5–5.2)
Alkaline Phosphatase: 117 U/L (ref 39–117)
Total Bilirubin: 0.4 mg/dL (ref 0.3–1.2)
Total Protein: 7.4 g/dL (ref 6.0–8.3)

## 2014-02-22 LAB — URINE MICROSCOPIC-ADD ON

## 2014-02-22 LAB — TSH: TSH: 10.46 u[IU]/mL — ABNORMAL HIGH (ref 0.350–4.500)

## 2014-02-22 MED ORDER — APIXABAN 5 MG PO TABS
5.0000 mg | ORAL_TABLET | Freq: Two times a day (BID) | ORAL | Status: DC
  Administered 2014-02-22 – 2014-03-01 (×14): 5 mg via ORAL
  Filled 2014-02-22 (×15): qty 1

## 2014-02-22 MED ORDER — FOLIC ACID 1 MG PO TABS
1.0000 mg | ORAL_TABLET | Freq: Every day | ORAL | Status: DC
  Administered 2014-02-22 – 2014-03-01 (×8): 1 mg via ORAL
  Filled 2014-02-22 (×9): qty 1

## 2014-02-22 MED ORDER — POTASSIUM CHLORIDE CRYS ER 20 MEQ PO TBCR
20.0000 meq | EXTENDED_RELEASE_TABLET | Freq: Every day | ORAL | Status: DC
  Administered 2014-02-22 – 2014-03-01 (×8): 20 meq via ORAL
  Filled 2014-02-22 (×9): qty 1

## 2014-02-22 MED ORDER — CIPROFLOXACIN IN D5W 400 MG/200ML IV SOLN
400.0000 mg | Freq: Two times a day (BID) | INTRAVENOUS | Status: DC
  Administered 2014-02-23: 400 mg via INTRAVENOUS
  Filled 2014-02-22: qty 200

## 2014-02-22 MED ORDER — AMIODARONE HCL 200 MG PO TABS
200.0000 mg | ORAL_TABLET | Freq: Every day | ORAL | Status: DC
  Administered 2014-02-23 – 2014-03-01 (×7): 200 mg via ORAL
  Filled 2014-02-22 (×8): qty 1

## 2014-02-22 MED ORDER — ONDANSETRON HCL 4 MG PO TABS: 4.0000 mg | ORAL_TABLET | Freq: Four times a day (QID) | ORAL | Status: DC | PRN

## 2014-02-22 MED ORDER — DILTIAZEM HCL ER COATED BEADS 180 MG PO CP24
180.0000 mg | ORAL_CAPSULE | Freq: Every day | ORAL | Status: DC
  Administered 2014-02-23 – 2014-03-01 (×7): 180 mg via ORAL
  Filled 2014-02-22 (×9): qty 1

## 2014-02-22 MED ORDER — VITAMIN B-1 100 MG PO TABS
100.0000 mg | ORAL_TABLET | Freq: Every day | ORAL | Status: DC
  Administered 2014-02-23 – 2014-03-01 (×7): 100 mg via ORAL
  Filled 2014-02-22 (×8): qty 1

## 2014-02-22 MED ORDER — CIPROFLOXACIN IN D5W 400 MG/200ML IV SOLN
400.0000 mg | Freq: Once | INTRAVENOUS | Status: AC
  Administered 2014-02-22: 400 mg via INTRAVENOUS
  Filled 2014-02-22: qty 200

## 2014-02-22 MED ORDER — IOHEXOL 300 MG/ML  SOLN
100.0000 mL | Freq: Once | INTRAMUSCULAR | Status: AC | PRN
  Administered 2014-02-22: 80 mL via INTRAVENOUS

## 2014-02-22 MED ORDER — GABAPENTIN 100 MG PO CAPS
100.0000 mg | ORAL_CAPSULE | Freq: Three times a day (TID) | ORAL | Status: DC
  Administered 2014-02-22 – 2014-02-24 (×7): 100 mg via ORAL
  Filled 2014-02-22 (×10): qty 1

## 2014-02-22 MED ORDER — DIPHENHYDRAMINE HCL 25 MG PO CAPS
25.0000 mg | ORAL_CAPSULE | Freq: Every day | ORAL | Status: DC
  Administered 2014-02-22 – 2014-02-28 (×7): 25 mg via ORAL
  Filled 2014-02-22 (×11): qty 1

## 2014-02-22 MED ORDER — POLYETHYLENE GLYCOL 3350 17 G PO PACK
17.0000 g | PACK | Freq: Every day | ORAL | Status: DC
  Administered 2014-02-25 – 2014-03-01 (×4): 17 g via ORAL
  Filled 2014-02-22 (×7): qty 1

## 2014-02-22 MED ORDER — VITAMIN D3 25 MCG (1000 UNIT) PO TABS
1000.0000 [IU] | ORAL_TABLET | Freq: Every day | ORAL | Status: DC
  Administered 2014-02-23 – 2014-03-01 (×7): 1000 [IU] via ORAL
  Filled 2014-02-22 (×7): qty 1

## 2014-02-22 MED ORDER — PANTOPRAZOLE SODIUM 40 MG PO TBEC
40.0000 mg | DELAYED_RELEASE_TABLET | Freq: Every day | ORAL | Status: DC
  Administered 2014-02-23 – 2014-03-01 (×7): 40 mg via ORAL
  Filled 2014-02-22 (×10): qty 1

## 2014-02-22 MED ORDER — ACETAMINOPHEN 650 MG RE SUPP: 650.0000 mg | Freq: Four times a day (QID) | RECTAL | Status: DC | PRN

## 2014-02-22 MED ORDER — FUROSEMIDE 40 MG PO TABS
40.0000 mg | ORAL_TABLET | Freq: Every day | ORAL | Status: DC
  Administered 2014-02-23: 40 mg via ORAL
  Filled 2014-02-22: qty 1

## 2014-02-22 MED ORDER — DIGOXIN 125 MCG PO TABS
0.1250 mg | ORAL_TABLET | Freq: Every day | ORAL | Status: DC
  Administered 2014-02-23 – 2014-03-01 (×7): 0.125 mg via ORAL
  Filled 2014-02-22 (×8): qty 1

## 2014-02-22 MED ORDER — IOHEXOL 300 MG/ML  SOLN
50.0000 mL | Freq: Once | INTRAMUSCULAR | Status: AC | PRN
  Administered 2014-02-22: 50 mL via ORAL

## 2014-02-22 MED ORDER — ALBUTEROL SULFATE HFA 108 (90 BASE) MCG/ACT IN AERS: 2.0000 | INHALATION_SPRAY | Freq: Two times a day (BID) | RESPIRATORY_TRACT | Status: DC | PRN

## 2014-02-22 MED ORDER — OXYCODONE HCL 5 MG PO TABS
5.0000 mg | ORAL_TABLET | Freq: Four times a day (QID) | ORAL | Status: DC | PRN
  Administered 2014-02-23 – 2014-02-25 (×4): 5 mg via ORAL
  Filled 2014-02-22 (×4): qty 1

## 2014-02-22 MED ORDER — POLYSACCHARIDE IRON COMPLEX 150 MG PO CAPS
150.0000 mg | ORAL_CAPSULE | Freq: Every day | ORAL | Status: DC
  Administered 2014-02-23 – 2014-03-01 (×7): 150 mg via ORAL
  Filled 2014-02-22 (×8): qty 1

## 2014-02-22 MED ORDER — HYDROCODONE-ACETAMINOPHEN 5-325 MG PO TABS
1.0000 | ORAL_TABLET | ORAL | Status: DC | PRN
  Administered 2014-02-25: 2 via ORAL
  Administered 2014-02-26: 1 via ORAL
  Administered 2014-02-26 – 2014-02-28 (×4): 2 via ORAL
  Filled 2014-02-22 (×4): qty 2
  Filled 2014-02-22: qty 1
  Filled 2014-02-22 (×2): qty 2

## 2014-02-22 MED ORDER — ENSURE COMPLETE PO LIQD
237.0000 mL | Freq: Two times a day (BID) | ORAL | Status: DC
  Administered 2014-02-22: 237 mL via ORAL
  Filled 2014-02-22: qty 237

## 2014-02-22 MED ORDER — ALPRAZOLAM 0.5 MG PO TABS
0.5000 mg | ORAL_TABLET | Freq: Two times a day (BID) | ORAL | Status: DC | PRN
  Administered 2014-02-22 – 2014-02-28 (×9): 0.5 mg via ORAL
  Filled 2014-02-22 (×9): qty 1

## 2014-02-22 MED ORDER — ACETAMINOPHEN 325 MG PO TABS: 650.0000 mg | ORAL_TABLET | Freq: Four times a day (QID) | ORAL | Status: DC | PRN

## 2014-02-22 MED ORDER — ALBUTEROL SULFATE (2.5 MG/3ML) 0.083% IN NEBU
2.5000 mg | INHALATION_SOLUTION | Freq: Two times a day (BID) | RESPIRATORY_TRACT | Status: DC | PRN
  Administered 2014-02-24 – 2014-02-26 (×2): 2.5 mg via RESPIRATORY_TRACT
  Filled 2014-02-22 (×2): qty 3

## 2014-02-22 MED ORDER — LEVOTHYROXINE SODIUM 25 MCG PO TABS
25.0000 ug | ORAL_TABLET | Freq: Every day | ORAL | Status: DC
  Administered 2014-02-23 – 2014-03-01 (×7): 25 ug via ORAL
  Filled 2014-02-22 (×8): qty 1

## 2014-02-22 MED ORDER — ONDANSETRON HCL 4 MG/2ML IJ SOLN: 4.0000 mg | Freq: Four times a day (QID) | INTRAMUSCULAR | Status: DC | PRN

## 2014-02-22 NOTE — ED Notes (Signed)
Bed: TM19 Expected date:  Expected time:  Means of arrival:  Comments: EMS - Weakness, hx of CA

## 2014-02-22 NOTE — Telephone Encounter (Signed)
I received a message from Ambrose Mantle ( sister) and she reports pt is declining rapidly and is requesting hospice . She also stated "Jerrie thinks she is in remission" . I called Denice Paradise back and left message to call me so we can discuss concerns.

## 2014-02-22 NOTE — Progress Notes (Signed)
Clinical Social Work Department BRIEF PSYCHOSOCIAL ASSESSMENT 02/22/2014  Patient:  Stephanie Oliver,Stephanie Oliver     Account Number:  [id number]     Admit date:  02/22/2014  Clinical Social Worker:  Corbett,Tressa, LCSWA  Date/Time:  02/22/2014 07:15 PM  Referred by:  CSW  Date Referred:  02/22/2014  Other Referral:   Interview type:  Patient Other interview type:   Aide at bedside from [facility]. Family was talked with over the phone.    PSYCHOSOCIAL DATA Living Status:  WITH ADULT CHILDREN Admitted from facility:   Level of care:   Primary support name:  Gary and wife Jo [phone] Primary support relationship to patient:  FAMILY Degree of support available:   High level of support.    CURRENT CONCERNS  Other Concerns:    SOCIAL WORK ASSESSMENT / PLAN CSW met with patient at bedside to complete this assessment.  Patient's nurse aide from [facility] was at bedside.  Patient is reporting that her family is suggesting she go into a rehab facility until she can get strong enought to return home.  Patient initally requested that RNCM set up services with hosp[facility] of [facility] but now changed her mind after speaking with her family.   Assessment/plan status:  Psychosocial Support/Ongoing Assessment of Needs Other assessment/ plan:   Information/referral to community resources:   SNF    PATIENT'S/FAMILY'S RESPONSE TO PLAN OF CARE: Patient expressed her appreciation for the support of the social work department.     Tressa Corbett, MSW, LCSWA, 02/22/2014 Evening Clinical Social Worker [phone]   

## 2014-02-22 NOTE — Telephone Encounter (Signed)
Denice Paradise called back and said " Neuropathy is debilitating-pt cannot turn herself, she is not making progress with PT. and has a pressure wound on foot trying to push herself up in bed. " . PT and home health RN are "pulling out of care'. Pt  is getting a [employer] fast streamer today. Home health nurse said she needs rehab because she cannot turn herself. Per Julien Nordmann pt can go to hospital to be evaluated for progressive weakness and unable to care for self.

## 2014-02-22 NOTE — Progress Notes (Signed)
CARE MANAGEMENT ED NOTE 02/22/2014  Patient:  Stephanie Oliver, Stephanie Oliver   Account Number:  [id number]  Date Initiated:  02/22/2014  Documentation initiated by:  Livia Snellen  Subjective/Objective Assessment:   Patient presents to Ed with increased weakness in legs.     Subjective/Objective Assessment Detail:   Patient with past medical history of lung caner with bone mets.     Action/Plan:   Action/Plan Detail:   Anticipated DC Date:       Status Recommendation to Physician:   Result of Recommendation:    Other ED Services  Consult Working Plan   In-house referral  Clinical Social Worker   DC Forensic scientist  CM consult  Other    Choice offered to / List presented to:           [facility].    Status of service:  Completed, signed off  ED Comments:   ED Comments Detail:  EDCM spoke to patient at bedside.  Patient reports her POA's are her brother Stephanie Oliver and his wife Stephanie Oliver.  Stephanie Oliver's phone number [phone].  Patient lives at home with her daughter Stephanie Oliver and her two year old son.  Patient reports patient's daughter doesn't help take care of her much at home.  Also at bedside is patient's aide from [facility] named Stephanie Oliver.  Stephanie Oliver reports that patient is seen by [facility] every day from 9am-3pm and from 6pm -9pm at night.  Patient's daughter works during the day and doesn't get hhome until six pm.  Patient is alone during the day from 3pm-6pm.  Patient has a [employer] fast streamer, hospital bed, bedside commode, walker wheelchair shower chair and gel pressure mattress on hospital bed at home.  Patient reports she has two pressure ulcers, one on her bottom and one on her heel.  Patient confirms she currently has services with [facility] for RN.  Patient reports PT discharged her from their services.  RN from [facility] doing wound care and helping her with her medications.  EDCM called [facility] at [facility] and spoke to [place] who confirms patient is still active with RN services, that patient has  plateued with PT services.  [facility] next scheduled visit is the 16th as per Erasmo Downer.  [facility] asked patient if she ever considered hospice?  Patient reports that she is agreeable to hospice services but asked [facility] if she could call her sister in law Stephanie Oliver and talk to her because, "She is one of my POA's."  Patient also reports she would like to go home upon discharge.  EDSW spoke to patient at bedside and reports that patient wanted to go to a SNF.  EDCM called patient's sister-in-law Stephanie Oliver at 1739pm. Per Stephanie Oliver, patient needs 24 hour care.  "she cannot turn herself in the bed or walk and she has wounds."  [facility] discussed hospice with Stephanie Oliver who reports that both she and her husband Stephanie Oliver are agreeable.  [facility] received permission from Stephanie Oliver to call the hospice and palliative care of [facility] for more information.  One of Stephanie Oliver's concerns is that patient is not tended too at night.  EDCM spoke to Deborah Chalk of [facility] at [phone] who confirms that hospice does not provide this type of service.  [facility] provided [facility] with fax number [phone] if patient and patient's family agreed to referral.  [facility] spoke to patient at bedside.  [facility] informed patient that it may be best to get together with her family and her doctor/oncologist and have a meeting regarding her goals  of care.  Patient is agreeable to this and wants "Everyone to be on the same page."  Admitting physician at bedside.  [facility] updtaed admitting physician of above and asked to call patient's sister in law Stephanie Oliver with updates. Discussed Palliative Care consult with admitting physician. No further EDCM needs at this time.

## 2014-02-22 NOTE — H&P (Signed)
[facility] History and Physical  Patient: Stephanie Oliver  HCW:[id number]  DOB: 11/01/1950  DOS: the patient was seen and examined on 02/22/2014 PCP: [name], MD  Chief Complaint: Generalized weakness  HPI: Stephanie Oliver is a 63 y.o. female with Past medical history of metastatic non-small cell lung cancer, depression, hypertension, osteoporosis,. The patient is presenting with generalized weakness and right leg pain. She has history of metastatic lung cancer and has completed her course of chemotherapy with radiation and was under observation. Since last 4 weeks she has been having progressively worsening right leg pain with tingling and weakness. She has history of right hip arthroplasty ever since which she has pain on the right leg with limitation of mobility. She has been working with physical therapy at home and had an episode of ankle sprain for which she was recommended to wear a boot one month ago. Since then she has progressively worsening pain and since last one week she has not been able to walk around due to the pain. She has also developed a pressure ulcer on the right heel which she has noticed yesterday. She has increased urinary frequency but denies any burning urination or fever complains of some chills. Recently her thyroid medication dose was increased. She denies any fall trauma injury. Since last few days she has not been able to lift her foot. 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  The patient is coming from home. And at her baseline independent for most of her ADL.  Review of Systems: as mentioned in the history of present illness.  A Comprehensive review of the other systems is negative.  Past Medical History  Diagnosis Date  . Depression   . Panic attacks   . Hypertension   . Smoker   . Alcohol abuse   . History of radiation therapy 07/28/13-08/25/13    rt lung,37.5Gy/84fx   . Cancer     lung ca   Past Surgical History  Procedure Laterality Date  . Tonsillectomy    . Back surgery    . Femur im nail Right 06/22/2013    Procedure: RIGHT HIP GAMMA NAIL FIXATION   ;  Surgeon: Renette Butters, MD;  Location: [facility];  Service: Orthopedics;  Laterality: Right;  . Video bronchoscopy with endobronchial ultrasound N/A 06/22/2013    Procedure: VIDEO BRONCHOSCOPY WITH ENDOBRONCHIAL ULTRASOUND;  Surgeon: Doree Fudge, MD;  Location: [facility];  Service: Pulmonary;  Laterality: N/A;   Social History:  reports that she quit smoking about 8 months ago. Her smoking use included Cigarettes. She smoked 1.50 packs per day. She has never used smokeless tobacco. She reports that she drinks about 2.4 ounces of alcohol per week. She reports that she does not use illicit drugs.  Allergies  Allergen Reactions  . Paxil [Paroxetine] Diarrhea, Nausea Only and Other (See Comments)    Also dizziness  . Lisinopril Diarrhea and Nausea Only  . Penicillins Hives    Family History  Problem Relation Age of Onset  . CAD Father   . Arthritis Mother     Prior to Admission medications   Medication Sig Start Date End Date Taking? Authorizing Provider  albuterol (PROVENTIL HFA;VENTOLIN HFA) 108 (90 BASE) MCG/ACT inhaler Inhale 2 puffs into the lungs 2 (two) times daily as needed.    Yes Historical Provider, MD  ALPRAZolam Duanne Moron) 0.5 MG tablet Take 0.5 mg by mouth 2 (two) times daily as needed for  anxiety.   Yes Historical Provider, MD  amiodarone (PACERONE) 200 MG tablet Take 1 tablet (200 mg total) by mouth daily. 08/20/13  Yes Birdie Riddle, MD  apixaban (ELIQUIS) 5 MG TABS tablet Take 1 tablet (5 mg total) by mouth 2 (two) times daily. 07/13/13  Yes Ivan Anchors Love, PA-C  Calcium Carbonate (CALTRATE 600 PO) Take 1 tablet by mouth daily.   Yes Historical Provider, MD  cholecalciferol (VITAMIN D) 1000 UNITS tablet Take 1,000 Units by mouth daily.   Yes Historical Provider, MD  digoxin  (LANOXIN) 0.125 MG tablet Take 1 tablet (0.125 mg total) by mouth daily. 08/15/13  Yes Birdie Riddle, MD  diltiazem (CARDIZEM CD) 180 MG 24 hr capsule Take 180 mg by mouth daily.   Yes Historical Provider, MD  diphenhydrAMINE (BENADRYL) 25 MG tablet Take 25 mg by mouth at bedtime.   Yes Historical Provider, MD  feeding supplement, ENSURE COMPLETE, (ENSURE COMPLETE) LIQD Take 237 mLs by mouth 2 (two) times daily between meals. 06/29/13  Yes Bobby Rumpf York, PA-C  folic acid (FOLVITE) 1 MG tablet Take 1 tablet (1 mg total) by mouth daily. 07/13/13  Yes Ivan Anchors Love, PA-C  furosemide (LASIX) 40 MG tablet Take 40 mg by mouth daily. 08/15/13  Yes Birdie Riddle, MD  gabapentin (NEURONTIN) 100 MG capsule Take 1 capsule (100 mg total) by mouth 3 (three) times daily. 02/03/14  Yes Curt Bears, MD  iron polysaccharides (NIFEREX) 150 MG capsule Take 1 capsule (150 mg total) by mouth daily. 07/13/13  Yes Ivan Anchors Love, PA-C  levalbuterol (XOPENEX) 0.63 MG/3ML nebulizer solution Take 0.63 mg by nebulization every 4 (four) hours as needed for wheezing or shortness of breath.   Yes Historical Provider, MD  levothyroxine (SYNTHROID, LEVOTHROID) 25 MCG tablet Take 25 mcg by mouth daily before breakfast.   Yes Historical Provider, MD  Multiple Vitamin (MULTIVITAMIN WITH MINERALS) TABS tablet Take 3 tablets by mouth daily.   Yes Historical Provider, MD  oxyCODONE (OXY IR/ROXICODONE) 5 MG immediate release tablet Take 5 mg by mouth 2 (two) times daily as needed for severe pain.   Yes Historical Provider, MD  pantoprazole (PROTONIX) 40 MG tablet Take 40 mg by mouth daily.   Yes Historical Provider, MD  polyethylene glycol (MIRALAX / GLYCOLAX) packet Take 17 g by mouth daily.   Yes Historical Provider, MD  potassium chloride SA (K-DUR,KLOR-CON) 20 MEQ tablet Take 1 tablet (20 mEq total) by mouth daily. 08/15/13  Yes Birdie Riddle, MD  thiamine 100 MG tablet Take 1 tablet (100 mg total) by mouth daily. 07/13/13  Yes Bary Leriche, PA-C    Physical Exam: Filed Vitals:   02/22/14 1323 02/22/14 1324 02/22/14 1540 02/22/14 1753  BP:  113/75 110/68 128/67  Pulse:  93 84 81  Temp:  98.2 F (36.8 C)    TempSrc:  Oral    Resp:  16 28 14   SpO2: 94% 98% 92% 92%    General: Alert, Awake and Oriented to Time, Place and Person. Appear in mild distress Eyes: PERRL ENT: Oral Mucosa clear moist. Neck: no JVD Cardiovascular: S1 and S2 Present, no Murmur, Peripheral Pulses Present Respiratory: Bilateral Air entry equal and Decreased, Clear to Auscultation, noCrackles, no wheezes Abdomen: Bowel Sound Present, Soft and Non tender Skin: no Rash Extremities: Right Pedal edema, no calf tenderness Neurologic: Grossly no focal neuro deficit. But limited sensory exam on the right foot, otherwise normal strength bilaterally equal and normal finger-nose-finger. Tenderness  of the right hip and gluteal region. Negative Babinski bilaterally  Labs on Admission:  CBC:  Recent Labs Lab 02/22/14 1440  WBC 9.5  HGB 10.3*  HCT 31.5*  MCV 93.5  PLT 277    CMP     Component Value Date/Time   NA 138 02/22/2014 1440   NA 138 01/11/2014 0952   K 3.7 02/22/2014 1440   K 3.5 01/11/2014 0952   CL 92* 02/22/2014 1440   CO2 32 02/22/2014 1440   CO2 27 01/11/2014 0952   GLUCOSE 86 02/22/2014 1440   GLUCOSE 122 01/11/2014 0952   BUN 11 02/22/2014 1440   BUN 11.8 01/11/2014 0952   CREATININE 0.54 02/22/2014 1440   CREATININE 0.7 01/11/2014 0952   CALCIUM 9.9 02/22/2014 1440   CALCIUM 9.9 01/11/2014 0952   PROT 7.4 02/22/2014 1531   PROT 7.3 01/11/2014 0952   ALBUMIN 3.3* 02/22/2014 1531   ALBUMIN 3.2* 01/11/2014 0952   AST 25 02/22/2014 1531   AST 16 01/11/2014 0952   ALT 15 02/22/2014 1531   ALT 16 01/11/2014 0952   ALKPHOS 117 02/22/2014 1531   ALKPHOS 105 01/11/2014 0952   BILITOT 0.4 02/22/2014 1531   BILITOT 0.41 01/11/2014 0952   GFRNONAA >90 02/22/2014 1440   GFRAA >90 02/22/2014 1440    No results found for this basename: LIPASE, AMYLASE,  in the  last 168 hours No results found for this basename: AMMONIA,  in the last 168 hours  No results found for this basename: CKTOTAL, CKMB, CKMBINDEX, TROPONINI,  in the last 168 hours BNP (last 3 results)  Recent Labs  06/23/13 0830 06/24/13 0309  PROBNP 680.4* 995.0*    Radiological Exams on Admission: Ct Chest W Contrast  02/22/2014   CLINICAL DATA:  Right-sided weakness radiating to the right hip and leg for 1 week. History of lung, liver and hip cancer.  EXAM: CT CHEST, ABDOMEN, AND PELVIS WITH CONTRAST  TECHNIQUE: Multidetector CT imaging of the chest, abdomen and pelvis was performed following the standard protocol during bolus administration of intravenous contrast.  CONTRAST:  11mL OMNIPAQUE IOHEXOL 300 MG/ML SOLN, 54mL OMNIPAQUE IOHEXOL 300 MG/ML SOLN  COMPARISON:  CT the chest, abdomen pelvis - 01/07/2021  FINDINGS: CT CHEST FINDINGS  Ill-defined soft tissue about the medial aspect of the right upper lobe is grossly unchanged, measuring approximately 1.4 x 3.3 cm (image 27, series 7) as is the ill-defined soft tissue about the more caudal aspect of the right hilum measuring approximately 1.6 cm (image 34, series 7). Grossly unchanged right upper lobe volume loss. There is unchanged complete collapse of the right middle lobe. Perihilar bronchiectasis and architectural distortion are unchanged compatible with sequela of prior radiation therapy.  There is minimal subsegmental atelectasis within the dependent portion of the left lower lobe. No new focal airspace opacities. No pleural effusion or pneumothorax. The central pulmonary airways are otherwise patent as described.  Unchanged punctate (approximately 7 mm) calcified granuloma within the right upper lobe (image 18, series 7) and partially calcified mediastinal and right hilar lymph nodes, the sequela of prior granulomatous infection.  No worsening mediastinal or hilar lymphadenopathy. No axillary lymphadenopathy.  Normal heart size. Coronary  artery calcifications. Atherosclerotic plaque within a normal caliber thoracic aorta.  Conventional configuration of the aortic arch. The origin and proximal aspects of the branch vessels of the aortic arch (in particular, the left subclavian artery) contain a moderate amount of calcified plaque though remain widely patent. No thoracic aortic dissection or periaortic  stranding. Although this examination was not tailored for the evaluation of the pulmonary arteries, there are no discrete filling defects within the central pulmonary arterial tree to suggest central pulmonary embolism.  CT ABDOMEN AND PELVIS FINDINGS  Normal hepatic contour. There is progressive metastatic disease within the liver. Dominant centrally necrotic mass within the caudal aspect of the lateral segment of the left lobe of the liver has increased in size, now measuring 4.6 x 3.8 cm (image 60, series 3), previously, 2.8 x 2.5 cm, it is now associated with a minimal amount of adjacent intrahepatic biliary duct dilatation (image 55, series 3). Interval increase in size of the ill-defined hypo attenuating mass with the central aspect of the right lobe of the liver, now measuring approximately 1.5 cm (image 46), previously, 1.2 cm. The ill-defined area of hyper enhancement within the dome of the right lobe of the liver (image 36, series 3) is unchanged though also remains worrisome for an additional area of metastatic disease. There is a minimal amount of focal fatty infiltration adjacent to the fissure for ligamentum teres. Normal appearance of the gallbladder. No ascites.  There is symmetric enhancement and excretion of the bilateral kidneys. No renal stones on this postcontrast examination. Left-sided hypoattenuating renal lesions too small to adequately characterize of favored to represent renal cysts. No urinary obstruction or perinephric stranding.  Unchanged mild thickening of the medial limb of the left adrenal gland (image 56, series 5, too  small to adequately characterize. Normal appearance of the right adrenal gland and spleen. The pancreas remains atrophic.  Moderate colonic stool burden without evidence of obstruction. Enteric contrast extends to the level of the hepatic flexure of the colon. The bowel is normal in course and caliber without wall thickening or evidence of obstruction. The appendix is not visualized, however there is no pericecal inflammatory change. No pneumoperitoneum, pneumatosis or portal venous gas.  Moderate amount of mixed calcified and noncalcified atherosclerotic plaque within a normal caliber abdominal aorta. The major branch vessels of the abdominal aorta appear patent on this non CTA examination. No retroperitoneal, mesenteric, pelvic or inguinal lymphadenopathy.  Post hysterectomy. No discrete adnexal lesions. No free fluid in the pelvic cul-de-sac.  Ill-defined area of linear sclerosis within the right sacral ale is unchanged (coronal images 55 and 56, series 4). Post intra medullary and dynamic screw fixation of the proximal femur, incompletely imaged. Severe degenerative change the right hip with joint space loss, articular surface irregularity and sclerosis. 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  IMPRESSION: Chest Impression  1. Stable post treatment change of the medial aspect of the right upper lobe and right hilum, similar to the 01/07/2014 examination. No evidence of progressive metastatic disease within the chest. 2. Coronary artery calcifications. Abdomen and Pelvis Impression:  1. Progressive metastatic disease within the liver with dominant lesion within the left lobe of the liver now measuring 4.6 cm in diameter, previously, 2.8 cm. 2. Post intra medullary and dynamic screw fixation of the right femur and femoral neck, incompletely imaged and evaluated - further evaluation could be performed with dedicated radiographs of the right hip and/or femur as clinically indicated. 3. Severe degenerative  change of the right hip. 4. Unchanged ill-defined area of linear sclerosis within the right sacral ala with differential considerations again including osseous metastatic disease versus is a sacral insufficiency fracture.   Electronically Signed   By: Sandi Mariscal M.D.   On: 02/22/2014 18:20   Ct Abdomen Pelvis W Contrast  02/22/2014  CLINICAL DATA:  Right-sided weakness radiating to the right hip and leg for 1 week. History of lung, liver and hip cancer.  EXAM: CT CHEST, ABDOMEN, AND PELVIS WITH CONTRAST  TECHNIQUE: Multidetector CT imaging of the chest, abdomen and pelvis was performed following the standard protocol during bolus administration of intravenous contrast.  CONTRAST:  1mL OMNIPAQUE IOHEXOL 300 MG/ML SOLN, 75mL OMNIPAQUE IOHEXOL 300 MG/ML SOLN  COMPARISON:  CT the chest, abdomen pelvis - 01/07/2021  FINDINGS: CT CHEST FINDINGS  Ill-defined soft tissue about the medial aspect of the right upper lobe is grossly unchanged, measuring approximately 1.4 x 3.3 cm (image 27, series 7) as is the ill-defined soft tissue about the more caudal aspect of the right hilum measuring approximately 1.6 cm (image 34, series 7). Grossly unchanged right upper lobe volume loss. There is unchanged complete collapse of the right middle lobe. Perihilar bronchiectasis and architectural distortion are unchanged compatible with sequela of prior radiation therapy.  There is minimal subsegmental atelectasis within the dependent portion of the left lower lobe. No new focal airspace opacities. No pleural effusion or pneumothorax. The central pulmonary airways are otherwise patent as described.  Unchanged punctate (approximately 7 mm) calcified granuloma within the right upper lobe (image 18, series 7) and partially calcified mediastinal and right hilar lymph nodes, the sequela of prior granulomatous infection.  No worsening mediastinal or hilar lymphadenopathy. No axillary lymphadenopathy.  Normal heart size. Coronary artery  calcifications. Atherosclerotic plaque within a normal caliber thoracic aorta.  Conventional configuration of the aortic arch. The origin and proximal aspects of the branch vessels of the aortic arch (in particular, the left subclavian artery) contain a moderate amount of calcified plaque though remain widely patent. No thoracic aortic dissection or periaortic stranding. Although this examination was not tailored for the evaluation of the pulmonary arteries, there are no discrete filling defects within the central pulmonary arterial tree to suggest central pulmonary embolism.  CT ABDOMEN AND PELVIS FINDINGS  Normal hepatic contour. There is progressive metastatic disease within the liver. Dominant centrally necrotic mass within the caudal aspect of the lateral segment of the left lobe of the liver has increased in size, now measuring 4.6 x 3.8 cm (image 60, series 3), previously, 2.8 x 2.5 cm, it is now associated with a minimal amount of adjacent intrahepatic biliary duct dilatation (image 55, series 3). Interval increase in size of the ill-defined hypo attenuating mass with the central aspect of the right lobe of the liver, now measuring approximately 1.5 cm (image 46), previously, 1.2 cm. The ill-defined area of hyper enhancement within the dome of the right lobe of the liver (image 36, series 3) is unchanged though also remains worrisome for an additional area of metastatic disease. There is a minimal amount of focal fatty infiltration adjacent to the fissure for ligamentum teres. Normal appearance of the gallbladder. No ascites.  There is symmetric enhancement and excretion of the bilateral kidneys. No renal stones on this postcontrast examination. Left-sided hypoattenuating renal lesions too small to adequately characterize of favored to represent renal cysts. No urinary obstruction or perinephric stranding.  Unchanged mild thickening of the medial limb of the left adrenal gland (image 56, series 5, too small  to adequately characterize. Normal appearance of the right adrenal gland and spleen. The pancreas remains atrophic.  Moderate colonic stool burden without evidence of obstruction. Enteric contrast extends to the level of the hepatic flexure of the colon. The bowel is normal in course and caliber without wall thickening or  evidence of obstruction. The appendix is not visualized, however there is no pericecal inflammatory change. No pneumoperitoneum, pneumatosis or portal venous gas.  Moderate amount of mixed calcified and noncalcified atherosclerotic plaque within a normal caliber abdominal aorta. The major branch vessels of the abdominal aorta appear patent on this non CTA examination. No retroperitoneal, mesenteric, pelvic or inguinal lymphadenopathy.  Post hysterectomy. No discrete adnexal lesions. No free fluid in the pelvic cul-de-sac.  Ill-defined area of linear sclerosis within the right sacral ale is unchanged (coronal images 55 and 56, series 4). Post intra medullary and dynamic screw fixation of the proximal femur, incompletely imaged. Severe degenerative change the right hip with joint space loss, articular surface irregularity and sclerosis. 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  IMPRESSION: Chest Impression  1. Stable post treatment change of the medial aspect of the right upper lobe and right hilum, similar to the 01/07/2014 examination. No evidence of progressive metastatic disease within the chest. 2. Coronary artery calcifications. Abdomen and Pelvis Impression:  1. Progressive metastatic disease within the liver with dominant lesion within the left lobe of the liver now measuring 4.6 cm in diameter, previously, 2.8 cm. 2. Post intra medullary and dynamic screw fixation of the right femur and femoral neck, incompletely imaged and evaluated - further evaluation could be performed with dedicated radiographs of the right hip and/or femur as clinically indicated. 3. Severe degenerative change  of the right hip. 4. Unchanged ill-defined area of linear sclerosis within the right sacral ala with differential considerations again including osseous metastatic disease versus is a sacral insufficiency fracture.   Electronically Signed   By: Sandi Mariscal M.D.   On: 02/22/2014 18:20    Assessment/Plan Principal Problem:   UTI (lower urinary tract infection) Active Problems:   Intertrochanteric fracture of right hip   Hypertension   Osteoporosis   Bipolar 1 disorder   Mediastinal lymphadenopathy   PAF (paroxysmal atrial fibrillation)   Non-small cell cancer of right lung   Sacral insufficiency fracture   Foot drop   Peripheral neuropathy   1. UTI (lower urinary tract infection) The patient is presenting with complaints of generalized weakness and progressive fatigue as well as right leg pain and a decubitus ulcer. On further workup she is found to have UTI which can cause generalized weakness and progressive. At present the patient is unable to mobilize on her own and she does not have 24-hour care at home. She has severe osteoporosis and her CT scan of the abdomen and pelvis is suggestive of possible sacral insufficiency fracture as well as loosening of the prosthetic screw and a right intertrochanteric hip fracture. With this the patient will be admitted in the hospital for further workup of her to UTI and therapy. We will give her IV ciprofloxacin and follow the cultures.  2. Possible Sacral insufficiency fracture Peripheral neuropathy Foot drop Orthopedics will be consulted for the patient,  Appreciate their input, they will follow the patient. Patient has history of peripheral neuropathy due to chemotherapy with progressively worsening foot drop. At present she will be kept on bedrest, prevalon boot.  we will check further workup for foot drop and neuropathy. Continue gabapentin  3. A. fib  Continue digoxin and amiodarone as well as Eliquis and Cardizem  4. metastatic lung  cancer  at present the patient is leaning towards hospice, therefore we will discuss the case with hospice in the morning. Patient was to remain full code at present. We will also consult oncology. We will  discuss with patient's family in the morning.  Consults:  orthopedics , oncologic    DVT Prophylaxis:on chronic anticoagulation Nutrition:  cardiac diet   Code Status:  full   Disposition: Admitted to inpatient in med-surge unit.  Author: Berle Mull, MD [facility] Pager: [phone] 02/22/2014, 8:46 PM    If 7PM-7AM, please contact night-coverage [email or web address] Password [email or web address]  **Disclaimer: This note may have been dictated with voice recognition software. Similar sounding words can inadvertently be transcribed and this note may contain transcription errors which may not have been corrected upon publication of note.**

## 2014-02-22 NOTE — ED Notes (Signed)
Patient transported to X-ray 

## 2014-02-22 NOTE — Progress Notes (Signed)
ANTIBIOTIC CONSULT NOTE - INITIAL  Pharmacy Consult for Cipro Indication: UTI  Allergies  Allergen Reactions  . Paxil [Paroxetine] Diarrhea, Nausea Only and Other (See Comments)    Also dizziness  . Lisinopril Diarrhea and Nausea Only  . Penicillins Hives    Patient Measurements: Height: 5\' 3"  (160 cm) Weight: 110 lb 7.2 oz (50.1 kg) IBW/kg (Calculated) : 52.4  Vital Signs: Temp: 98.2 F (36.8 C) (09/08 1324) Temp src: Oral (09/08 1324) BP: 128/67 mmHg (09/08 1753) Pulse Rate: 81 (09/08 1753) Intake/Output from previous day:   Intake/Output from this shift:    Labs:  Recent Labs  02/22/14 1440  WBC 9.5  HGB 10.3*  PLT 277  CREATININE 0.54   Estimated Creatinine Clearance: 56.2 ml/min (by C-G formula based on Cr of 0.54). No results found for this basename: VANCOTROUGH, VANCOPEAK, VANCORANDOM, GENTTROUGH, GENTPEAK, GENTRANDOM, TOBRATROUGH, TOBRAPEAK, TOBRARND, AMIKACINPEAK, AMIKACINTROU, AMIKACIN,  in the last 72 hours   Microbiology: No results found for this or any previous visit (from the past 720 hour(s)).  Medical History: Past Medical History  Diagnosis Date  . Depression   . Panic attacks   . Hypertension   . Smoker   . Alcohol abuse   . History of radiation therapy 07/28/13-08/25/13    rt lung,37.5Gy/12fx  . Cancer     lung ca    Medications:  Scheduled:  . amiodarone  200 mg Oral Daily  . [START ON 02/23/2014] cholecalciferol  1,000 Units Oral Daily  . digoxin  0.125 mg Oral Daily  . diltiazem  180 mg Oral Daily  . diphenhydrAMINE  25 mg Oral QHS  . feeding supplement (ENSURE COMPLETE)  237 mL Oral BID BM  . folic acid  1 mg Oral Daily  . [START ON 02/23/2014] furosemide  40 mg Oral Daily  . gabapentin  100 mg Oral TID  . [START ON 02/23/2014] iron polysaccharides  150 mg Oral Daily  . [START ON 02/23/2014] levothyroxine  25 mcg Oral QAC breakfast  . pantoprazole  40 mg Oral Daily  . [START ON 02/23/2014] polyethylene glycol  17 g Oral Daily  .  potassium chloride SA  20 mEq Oral Daily  . thiamine  100 mg Oral Daily   Infusions:  . ciprofloxacin 400 mg (02/22/14 2047)   Assessment:  63 yr female with h/o metastatic lung cancer involving liver and hip  Presented to ED with complaints of weakness to hip and leg  Pt with UTI; culture ordered  No H&P available at this time  Received Cipro 400mg  IV x 1 in ED @ 20:47  Pharmacy consulted to continue dosing of Cipro for UTI  Goal of Therapy:  Eradication of infection  Plan:  Follow up culture results Cipro 400mg  IV q12h  Everette Rank, PharmD 02/22/2014,8:54 PM

## 2014-02-22 NOTE — ED Notes (Signed)
Pt brought in by EMS from home. Pt reports R side weakness to hip and leg for one week. Pt denies SOB, fever or chills. Pt alert and oriented. Pt has hx of lung, liver and hip cancer.

## 2014-02-22 NOTE — ED Provider Notes (Signed)
CSN: [id number]     Arrival date & time 02/22/14  1322 History   First MD Initiated Contact with Patient 02/22/14 1501     Chief Complaint  Patient presents with  . Extremity Weakness     (Consider location/radiation/quality/duration/timing/severity/associated sxs/prior Treatment) Patient is a 63 y.o. female presenting with extremity weakness. The history is provided by the patient.  Extremity Weakness This is a chronic problem. Pertinent negatives include no chest pain, no abdominal pain and no shortness of breath.   patient has a history of metastatic lung cancer. Has been on chemotherapy with the last time around 3 months ago. She is doing somewhat better but then began to get worse over last couple months, particularly over the last 2 weeks. She is no longer able to get up all around. She has some help at home. She has had neuropathy due to chemotherapy. She has chronic weakness of her right leg has gotten worse. She also has some lower back pain. Patient discussed with her oncologist to see to go to the ER for evaluation the weakness and evaluation for skilled nursing placement. No dysuria. She has some new decubitus ulcers are healed and back side also.   Past Medical History  Diagnosis Date  . Depression   . Panic attacks   . Hypertension   . Smoker   . Alcohol abuse   . History of radiation therapy 07/28/13-08/25/13    rt lung,37.5Gy/72fx  . Cancer     lung ca   Past Surgical History  Procedure Laterality Date  . Tonsillectomy    . Back surgery    . Femur im nail Right 06/22/2013    Procedure: RIGHT HIP GAMMA NAIL FIXATION   ;  Surgeon: Renette Butters, MD;  Location: [facility];  Service: Orthopedics;  Laterality: Right;  . Video bronchoscopy with endobronchial ultrasound N/A 06/22/2013    Procedure: VIDEO BRONCHOSCOPY WITH ENDOBRONCHIAL ULTRASOUND;  Surgeon: Doree Fudge, MD;  Location: [facility];  Service: Pulmonary;  Laterality: N/A;   Family History  Problem Relation  Age of Onset  . CAD Father   . Arthritis Mother    History  Substance Use Topics  . Smoking status: Former Smoker -- 1.50 packs/day    Types: Cigarettes    Quit date: 06/18/2013  . Smokeless tobacco: Never Used  . Alcohol Use: 2.4 oz/week    4 Shots of liquor per week     Comment: patient quit drinking 06/18/2013   OB History   Grav Para Term Preterm Abortions TAB SAB Ect Mult Living                 Review of Systems  Constitutional: Positive for appetite change and fatigue.  Respiratory: Negative for chest tightness and shortness of breath.   Cardiovascular: Negative for chest pain.  Gastrointestinal: Negative for abdominal pain.  Genitourinary: Negative for dysuria.  Musculoskeletal: Positive for back pain and extremity weakness. Negative for neck pain.  Neurological: Positive for weakness and numbness.      Allergies  Paxil; Lisinopril; and Penicillins  Home Medications   Prior to Admission medications   Medication Sig Start Date End Date Taking? Authorizing Provider  albuterol (PROVENTIL HFA;VENTOLIN HFA) 108 (90 BASE) MCG/ACT inhaler Inhale 2 puffs into the lungs 2 (two) times daily as needed.    Yes Historical Provider, MD  ALPRAZolam Duanne Moron) 0.5 MG tablet Take 0.5 mg by mouth 2 (two) times daily as needed for anxiety.   Yes Historical Provider, MD  amiodarone (  PACERONE) 200 MG tablet Take 1 tablet (200 mg total) by mouth daily. 08/20/13  Yes Birdie Riddle, MD  apixaban (ELIQUIS) 5 MG TABS tablet Take 1 tablet (5 mg total) by mouth 2 (two) times daily. 07/13/13  Yes Ivan Anchors Love, PA-C  Calcium Carbonate (CALTRATE 600 PO) Take 1 tablet by mouth daily.   Yes Historical Provider, MD  cholecalciferol (VITAMIN D) 1000 UNITS tablet Take 1,000 Units by mouth daily.   Yes Historical Provider, MD  digoxin (LANOXIN) 0.125 MG tablet Take 1 tablet (0.125 mg total) by mouth daily. 08/15/13  Yes Birdie Riddle, MD  diltiazem (CARDIZEM CD) 180 MG 24 hr capsule Take 180 mg by mouth  daily.   Yes Historical Provider, MD  diphenhydrAMINE (BENADRYL) 25 MG tablet Take 25 mg by mouth at bedtime.   Yes Historical Provider, MD  feeding supplement, ENSURE COMPLETE, (ENSURE COMPLETE) LIQD Take 237 mLs by mouth 2 (two) times daily between meals. 06/29/13  Yes Bobby Rumpf York, PA-C  folic acid (FOLVITE) 1 MG tablet Take 1 tablet (1 mg total) by mouth daily. 07/13/13  Yes Ivan Anchors Love, PA-C  furosemide (LASIX) 40 MG tablet Take 40 mg by mouth daily. 08/15/13  Yes Birdie Riddle, MD  gabapentin (NEURONTIN) 100 MG capsule Take 1 capsule (100 mg total) by mouth 3 (three) times daily. 02/03/14  Yes Curt Bears, MD  iron polysaccharides (NIFEREX) 150 MG capsule Take 1 capsule (150 mg total) by mouth daily. 07/13/13  Yes Ivan Anchors Love, PA-C  levalbuterol (XOPENEX) 0.63 MG/3ML nebulizer solution Take 0.63 mg by nebulization every 4 (four) hours as needed for wheezing or shortness of breath.   Yes Historical Provider, MD  levothyroxine (SYNTHROID, LEVOTHROID) 25 MCG tablet Take 25 mcg by mouth daily before breakfast.   Yes Historical Provider, MD  Multiple Vitamin (MULTIVITAMIN WITH MINERALS) TABS tablet Take 3 tablets by mouth daily.   Yes Historical Provider, MD  oxyCODONE (OXY IR/ROXICODONE) 5 MG immediate release tablet Take 5 mg by mouth 2 (two) times daily as needed for severe pain.   Yes Historical Provider, MD  pantoprazole (PROTONIX) 40 MG tablet Take 40 mg by mouth daily.   Yes Historical Provider, MD  polyethylene glycol (MIRALAX / GLYCOLAX) packet Take 17 g by mouth daily.   Yes Historical Provider, MD  potassium chloride SA (K-DUR,KLOR-CON) 20 MEQ tablet Take 1 tablet (20 mEq total) by mouth daily. 08/15/13  Yes Birdie Riddle, MD  thiamine 100 MG tablet Take 1 tablet (100 mg total) by mouth daily. 07/13/13  Yes Pamela S Love, PA-C   BP 116/60  Pulse 79  Temp(Src) 97.6 F (36.4 C) (Oral)  Resp 18  Ht 5\' 3"  (1.6 m)  Wt 111 lb 8.8 oz (50.6 kg)  BMI 19.77 kg/m2  SpO2 96% Physical  Exam  Nursing note and vitals reviewed. Constitutional: She is oriented to person, place, and time. She appears well-developed.  Patient is somewhat cachectic .  HENT:  Head: Normocephalic and atraumatic.  Eyes: EOM are normal. Pupils are equal, round, and reactive to light.  Neck: Normal range of motion. Neck supple.  Cardiovascular: Normal rate, regular rhythm and normal heart sounds.   No murmur heard. Pulmonary/Chest: Effort normal and breath sounds normal. No respiratory distress. She has no wheezes. She has no rales.  Abdominal: Soft. Bowel sounds are normal. She exhibits no distension. There is no tenderness. There is no rebound and no guarding.  Musculoskeletal: Normal range of motion.  Neurological: She  is alert and oriented to person, place, and time. No cranial nerve deficit.   decreased strength in right lower extremity. Difficult to raise the leg at all. Decreased sensation over bilateral lower legs and bilateral hands. Good flexion and extension upper extremities.  Skin: Skin is warm and dry.  Psychiatric: She has a normal mood and affect. Her speech is normal.    ED Course  Procedures (including critical care time) Labs Review Labs Reviewed  CBC - Abnormal; Notable for the following:    RBC 3.37 (*)    Hemoglobin 10.3 (*)    HCT 31.5 (*)    All other components within normal limits  BASIC METABOLIC PANEL - Abnormal; Notable for the following:    Chloride 92 (*)    All other components within normal limits  HEPATIC FUNCTION PANEL - Abnormal; Notable for the following:    Albumin 3.3 (*)    All other components within normal limits  TSH - Abnormal; Notable for the following:    TSH 10.460 (*)    All other components within normal limits  URINALYSIS, ROUTINE W REFLEX MICROSCOPIC - Abnormal; Notable for the following:    APPearance TURBID (*)    Leukocytes, UA LARGE (*)    All other components within normal limits  URINE MICROSCOPIC-ADD ON - Abnormal; Notable for  the following:    Bacteria, UA MANY (*)    All other components within normal limits  URINE CULTURE  COMPREHENSIVE METABOLIC PANEL  CBC  PROTIME-INR    Imaging Review Dg Pelvis 1-2 Views  02/22/2014   CLINICAL DATA:  Right foot drop.  Pain.  EXAM: PELVIS - 1-2 VIEW  COMPARISON:  CT 02/22/2014 and 01/08/1999 50.  FINDINGS: Prominent degenerative changes noted lumbar spine and both hips. Diffuse osteopenia. Patient has had prior open reduction internal fixation of right hip fracture, this appears stable. No acute bony abnormality . Contrast noted in the colon and the bladder from prior CT.  IMPRESSION: 1. Diffuse severe osteopenia and degenerative changes lumbar spine and both hips. 2. Open reduction internal fixation right hip fracture. This appears stable. 3. No acute abnormality.   Electronically Signed   By: Marcello Moores  Register   On: 02/22/2014 21:27   Dg Femur Right  02/22/2014   CLINICAL DATA:  Post right hip arthroplasty (06/2013) now with right foot drop and pain involving the right groin.  EXAM: RIGHT FEMUR - 2 VIEW  COMPARISON:  AP pelvis radiographs - earlier same day; CT the chest, abdomen and pelvis - earlier same day; right hip radiographs - 06/21/2013; right knee radiographs - 07/13/2013  FINDINGS: Osteopenia. Post intra medullary fixation of the femur and dynamic screw fixation of the right femoral neck.  There is persistent lucency about the comminuted intertrochanteric femur fracture site without displacement. No definite acute fracture or dislocation.  Severe degenerative change the right hip with joint space loss, subchondral sclerosis and osteophytosis.  The cancellous screw within the distal diaphysis of the femur is fractured.  IMPRESSION: 1. The distal transfixing femoral intra medullary rod screw is fractured suggestive of hardware loosening. 2. Severe degenerative change of the right hip.   Electronically Signed   By: Sandi Mariscal M.D.   On: 02/22/2014 21:32   Ct Chest W  Contrast  02/22/2014   CLINICAL DATA:  Right-sided weakness radiating to the right hip and leg for 1 week. History of lung, liver and hip cancer.  EXAM: CT CHEST, ABDOMEN, AND PELVIS WITH CONTRAST  TECHNIQUE: Multidetector CT  imaging of the chest, abdomen and pelvis was performed following the standard protocol during bolus administration of intravenous contrast.  CONTRAST:  36mL OMNIPAQUE IOHEXOL 300 MG/ML SOLN, 70mL OMNIPAQUE IOHEXOL 300 MG/ML SOLN  COMPARISON:  CT the chest, abdomen pelvis - 01/07/2021  FINDINGS: CT CHEST FINDINGS  Ill-defined soft tissue about the medial aspect of the right upper lobe is grossly unchanged, measuring approximately 1.4 x 3.3 cm (image 27, series 7) as is the ill-defined soft tissue about the more caudal aspect of the right hilum measuring approximately 1.6 cm (image 34, series 7). Grossly unchanged right upper lobe volume loss. There is unchanged complete collapse of the right middle lobe. Perihilar bronchiectasis and architectural distortion are unchanged compatible with sequela of prior radiation therapy.  There is minimal subsegmental atelectasis within the dependent portion of the left lower lobe. No new focal airspace opacities. No pleural effusion or pneumothorax. The central pulmonary airways are otherwise patent as described.  Unchanged punctate (approximately 7 mm) calcified granuloma within the right upper lobe (image 18, series 7) and partially calcified mediastinal and right hilar lymph nodes, the sequela of prior granulomatous infection.  No worsening mediastinal or hilar lymphadenopathy. No axillary lymphadenopathy.  Normal heart size. Coronary artery calcifications. Atherosclerotic plaque within a normal caliber thoracic aorta.  Conventional configuration of the aortic arch. The origin and proximal aspects of the branch vessels of the aortic arch (in particular, the left subclavian artery) contain a moderate amount of calcified plaque though remain widely patent. No  thoracic aortic dissection or periaortic stranding. Although this examination was not tailored for the evaluation of the pulmonary arteries, there are no discrete filling defects within the central pulmonary arterial tree to suggest central pulmonary embolism.  CT ABDOMEN AND PELVIS FINDINGS  Normal hepatic contour. There is progressive metastatic disease within the liver. Dominant centrally necrotic mass within the caudal aspect of the lateral segment of the left lobe of the liver has increased in size, now measuring 4.6 x 3.8 cm (image 60, series 3), previously, 2.8 x 2.5 cm, it is now associated with a minimal amount of adjacent intrahepatic biliary duct dilatation (image 55, series 3). Interval increase in size of the ill-defined hypo attenuating mass with the central aspect of the right lobe of the liver, now measuring approximately 1.5 cm (image 46), previously, 1.2 cm. The ill-defined area of hyper enhancement within the dome of the right lobe of the liver (image 36, series 3) is unchanged though also remains worrisome for an additional area of metastatic disease. There is a minimal amount of focal fatty infiltration adjacent to the fissure for ligamentum teres. Normal appearance of the gallbladder. No ascites.  There is symmetric enhancement and excretion of the bilateral kidneys. No renal stones on this postcontrast examination. Left-sided hypoattenuating renal lesions too small to adequately characterize of favored to represent renal cysts. No urinary obstruction or perinephric stranding.  Unchanged mild thickening of the medial limb of the left adrenal gland (image 56, series 5, too small to adequately characterize. Normal appearance of the right adrenal gland and spleen. The pancreas remains atrophic.  Moderate colonic stool burden without evidence of obstruction. Enteric contrast extends to the level of the hepatic flexure of the colon. The bowel is normal in course and caliber without wall thickening  or evidence of obstruction. The appendix is not visualized, however there is no pericecal inflammatory change. No pneumoperitoneum, pneumatosis or portal venous gas.  Moderate amount of mixed calcified and noncalcified atherosclerotic plaque within a normal  caliber abdominal aorta. The major branch vessels of the abdominal aorta appear patent on this non CTA examination. No retroperitoneal, mesenteric, pelvic or inguinal lymphadenopathy.  Post hysterectomy. No discrete adnexal lesions. No free fluid in the pelvic cul-de-sac.  Ill-defined area of linear sclerosis within the right sacral ale is unchanged (coronal images 55 and 56, series 4). Post intra medullary and dynamic screw fixation of the proximal femur, incompletely imaged. Severe degenerative change the right hip with joint space loss, articular surface irregularity and sclerosis. 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  IMPRESSION: Chest Impression  1. Stable post treatment change of the medial aspect of the right upper lobe and right hilum, similar to the 01/07/2014 examination. No evidence of progressive metastatic disease within the chest. 2. Coronary artery calcifications. Abdomen and Pelvis Impression:  1. Progressive metastatic disease within the liver with dominant lesion within the left lobe of the liver now measuring 4.6 cm in diameter, previously, 2.8 cm. 2. Post intra medullary and dynamic screw fixation of the right femur and femoral neck, incompletely imaged and evaluated - further evaluation could be performed with dedicated radiographs of the right hip and/or femur as clinically indicated. 3. Severe degenerative change of the right hip. 4. Unchanged ill-defined area of linear sclerosis within the right sacral ala with differential considerations again including osseous metastatic disease versus is a sacral insufficiency fracture.   Electronically Signed   By: Sandi Mariscal M.D.   On: 02/22/2014 18:20   Ct Abdomen Pelvis W  Contrast  02/22/2014   CLINICAL DATA:  Right-sided weakness radiating to the right hip and leg for 1 week. History of lung, liver and hip cancer.  EXAM: CT CHEST, ABDOMEN, AND PELVIS WITH CONTRAST  TECHNIQUE: Multidetector CT imaging of the chest, abdomen and pelvis was performed following the standard protocol during bolus administration of intravenous contrast.  CONTRAST:  80mL OMNIPAQUE IOHEXOL 300 MG/ML SOLN, 34mL OMNIPAQUE IOHEXOL 300 MG/ML SOLN  COMPARISON:  CT the chest, abdomen pelvis - 01/07/2021  FINDINGS: CT CHEST FINDINGS  Ill-defined soft tissue about the medial aspect of the right upper lobe is grossly unchanged, measuring approximately 1.4 x 3.3 cm (image 27, series 7) as is the ill-defined soft tissue about the more caudal aspect of the right hilum measuring approximately 1.6 cm (image 34, series 7). Grossly unchanged right upper lobe volume loss. There is unchanged complete collapse of the right middle lobe. Perihilar bronchiectasis and architectural distortion are unchanged compatible with sequela of prior radiation therapy.  There is minimal subsegmental atelectasis within the dependent portion of the left lower lobe. No new focal airspace opacities. No pleural effusion or pneumothorax. The central pulmonary airways are otherwise patent as described.  Unchanged punctate (approximately 7 mm) calcified granuloma within the right upper lobe (image 18, series 7) and partially calcified mediastinal and right hilar lymph nodes, the sequela of prior granulomatous infection.  No worsening mediastinal or hilar lymphadenopathy. No axillary lymphadenopathy.  Normal heart size. Coronary artery calcifications. Atherosclerotic plaque within a normal caliber thoracic aorta.  Conventional configuration of the aortic arch. The origin and proximal aspects of the branch vessels of the aortic arch (in particular, the left subclavian artery) contain a moderate amount of calcified plaque though remain widely patent. No  thoracic aortic dissection or periaortic stranding. Although this examination was not tailored for the evaluation of the pulmonary arteries, there are no discrete filling defects within the central pulmonary arterial tree to suggest central pulmonary embolism.  CT ABDOMEN AND PELVIS FINDINGS  Normal hepatic contour. There is progressive metastatic disease within the liver. Dominant centrally necrotic mass within the caudal aspect of the lateral segment of the left lobe of the liver has increased in size, now measuring 4.6 x 3.8 cm (image 60, series 3), previously, 2.8 x 2.5 cm, it is now associated with a minimal amount of adjacent intrahepatic biliary duct dilatation (image 55, series 3). Interval increase in size of the ill-defined hypo attenuating mass with the central aspect of the right lobe of the liver, now measuring approximately 1.5 cm (image 46), previously, 1.2 cm. The ill-defined area of hyper enhancement within the dome of the right lobe of the liver (image 36, series 3) is unchanged though also remains worrisome for an additional area of metastatic disease. There is a minimal amount of focal fatty infiltration adjacent to the fissure for ligamentum teres. Normal appearance of the gallbladder. No ascites.  There is symmetric enhancement and excretion of the bilateral kidneys. No renal stones on this postcontrast examination. Left-sided hypoattenuating renal lesions too small to adequately characterize of favored to represent renal cysts. No urinary obstruction or perinephric stranding.  Unchanged mild thickening of the medial limb of the left adrenal gland (image 56, series 5, too small to adequately characterize. Normal appearance of the right adrenal gland and spleen. The pancreas remains atrophic.  Moderate colonic stool burden without evidence of obstruction. Enteric contrast extends to the level of the hepatic flexure of the colon. The bowel is normal in course and caliber without wall thickening  or evidence of obstruction. The appendix is not visualized, however there is no pericecal inflammatory change. No pneumoperitoneum, pneumatosis or portal venous gas.  Moderate amount of mixed calcified and noncalcified atherosclerotic plaque within a normal caliber abdominal aorta. The major branch vessels of the abdominal aorta appear patent on this non CTA examination. No retroperitoneal, mesenteric, pelvic or inguinal lymphadenopathy.  Post hysterectomy. No discrete adnexal lesions. No free fluid in the pelvic cul-de-sac.  Ill-defined area of linear sclerosis within the right sacral ale is unchanged (coronal images 55 and 56, series 4). Post intra medullary and dynamic screw fixation of the proximal femur, incompletely imaged. Severe degenerative change the right hip with joint space loss, articular surface irregularity and sclerosis. 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  IMPRESSION: Chest Impression  1. Stable post treatment change of the medial aspect of the right upper lobe and right hilum, similar to the 01/07/2014 examination. No evidence of progressive metastatic disease within the chest. 2. Coronary artery calcifications. Abdomen and Pelvis Impression:  1. Progressive metastatic disease within the liver with dominant lesion within the left lobe of the liver now measuring 4.6 cm in diameter, previously, 2.8 cm. 2. Post intra medullary and dynamic screw fixation of the right femur and femoral neck, incompletely imaged and evaluated - further evaluation could be performed with dedicated radiographs of the right hip and/or femur as clinically indicated. 3. Severe degenerative change of the right hip. 4. Unchanged ill-defined area of linear sclerosis within the right sacral ala with differential considerations again including osseous metastatic disease versus is a sacral insufficiency fracture.   Electronically Signed   By: Sandi Mariscal M.D.   On: 02/22/2014 18:20     EKG  Interpretation   Date/Time:  Tuesday February 22 2014 14:52:27 EDT Ventricular Rate:  85 PR Interval:  168 QRS Duration: 86 QT Interval:  366 QTC Calculation: 435 R Axis:   63 Text Interpretation:  Sinus rhythm Probable anterior infarct, age  indeterminate Nonspecific ST and T wave abnormality Confirmed by Alvino Chapel   MD, Ovid Curd [phone]) on 02/22/2014 9:21:10 PM      MDM   Final diagnoses:  UTI (lower urinary tract infection)  Hypothyroidism, unspecified hypothyroidism type    Patient with generalized weakness but worse in the right lower leg. History of metastatic cancer. Also has UTI. Also hypothyroid. Has been seen by case management. His been discussion of hospice. Will admit to internal medicine.    Stephanie Oliver. Alvino Chapel, MD 02/23/14 [id number]

## 2014-02-23 ENCOUNTER — Inpatient Hospital Stay (HOSPITAL_COMMUNITY)

## 2014-02-23 DIAGNOSIS — E43 Unspecified severe protein-calorie malnutrition: Secondary | ICD-10-CM

## 2014-02-23 DIAGNOSIS — R29898 Other symptoms and signs involving the musculoskeletal system: Secondary | ICD-10-CM | POA: Diagnosis present

## 2014-02-23 DIAGNOSIS — Z515 Encounter for palliative care: Secondary | ICD-10-CM

## 2014-02-23 DIAGNOSIS — I4891 Unspecified atrial fibrillation: Secondary | ICD-10-CM

## 2014-02-23 LAB — COMPREHENSIVE METABOLIC PANEL
ALT: 13 U/L (ref 0–35)
ANION GAP: 13 (ref 5–15)
AST: 19 U/L (ref 0–37)
Albumin: 2.8 g/dL — ABNORMAL LOW (ref 3.5–5.2)
Alkaline Phosphatase: 104 U/L (ref 39–117)
BUN: 9 mg/dL (ref 6–23)
CHLORIDE: 93 meq/L — AB (ref 96–112)
CO2: 30 mEq/L (ref 19–32)
CREATININE: 0.5 mg/dL (ref 0.50–1.10)
Calcium: 9.6 mg/dL (ref 8.4–10.5)
GFR calc Af Amer: >90 mL/min (ref >90)
GFR calc non Af Amer: >90 mL/min (ref >90)
GLUCOSE: 93 mg/dL (ref 70–99)
Potassium: 3.2 mEq/L — ABNORMAL LOW (ref 3.7–5.3)
Sodium: 136 mEq/L — ABNORMAL LOW (ref 137–147)
Total Bilirubin: 0.4 mg/dL (ref 0.3–1.2)
Total Protein: 6.6 g/dL (ref 6.0–8.3)

## 2014-02-23 LAB — FOLATE: Folate: >20 ng/mL

## 2014-02-23 LAB — VITAMIN B12: VITAMIN B 12: 473 pg/mL (ref 211–911)

## 2014-02-23 LAB — OCCULT BLOOD X 1 CARD TO LAB, STOOL: FECAL OCCULT BLD: NEGATIVE

## 2014-02-23 LAB — CBC
HCT: 28.8 % — ABNORMAL LOW (ref 36.0–46.0)
Hemoglobin: 9.7 g/dL — ABNORMAL LOW (ref 12.0–15.0)
MCH: 30.7 pg (ref 26.0–34.0)
MCHC: 33.7 g/dL (ref 30.0–36.0)
MCV: 91.1 fL (ref 78.0–100.0)
PLATELETS: 249 10*3/uL (ref 150–400)
RBC: 3.16 MIL/uL — ABNORMAL LOW (ref 3.87–5.11)
RDW: 14.8 % (ref 11.5–15.5)
WBC: 6.7 10*3/uL (ref 4.0–10.5)

## 2014-02-23 LAB — PROTIME-INR
INR: 1.16 (ref 0.00–1.49)
Prothrombin Time: 14.8 seconds (ref 11.6–15.2)

## 2014-02-23 LAB — T4, FREE: FREE T4: 1.02 ng/dL (ref 0.80–1.80)

## 2014-02-23 MED ORDER — POTASSIUM CHLORIDE IN NACL 20-0.9 MEQ/L-% IV SOLN
INTRAVENOUS | Status: DC
  Administered 2014-02-23 – 2014-02-27 (×6): via INTRAVENOUS
  Filled 2014-02-23 (×10): qty 1000

## 2014-02-23 MED ORDER — SODIUM CHLORIDE 0.9 % IV BOLUS (SEPSIS)
500.0000 mL | Freq: Once | INTRAVENOUS | Status: AC
  Administered 2014-02-23: 500 mL via INTRAVENOUS

## 2014-02-23 MED ORDER — IOHEXOL 300 MG/ML  SOLN
100.0000 mL | Freq: Once | INTRAMUSCULAR | Status: AC | PRN
  Administered 2014-02-23: 100 mL via INTRAVENOUS

## 2014-02-23 MED ORDER — CIPROFLOXACIN HCL 250 MG PO TABS
250.0000 mg | ORAL_TABLET | Freq: Two times a day (BID) | ORAL | Status: DC
  Administered 2014-02-23 – 2014-02-25 (×4): 250 mg via ORAL
  Filled 2014-02-23 (×6): qty 1

## 2014-02-23 MED ORDER — LIDOCAINE 5 % EX PTCH
1.0000 | MEDICATED_PATCH | CUTANEOUS | Status: DC
  Administered 2014-02-23 – 2014-02-28 (×6): 1 via TRANSDERMAL
  Filled 2014-02-23 (×7): qty 1

## 2014-02-23 NOTE — Consult Note (Signed)
Patient Stephanie Oliver      DOB: [birth year]      IRC:[id number]     Consult Note from the Palliative Medicine Team at [facility] Requested by: Dr. Rockne Menghini    PCP: [name], MD Reason for Consultation: [place]    Phone Number:[phone] Related symptom mangement Assessment of patients Current state: 63 yr old white female recently diagnosed with stage IV lung cancer status post chemo and radiation presents with worsening weakness, found to have UTI.  Long discussion with patient and sister in law regarding goals and options.  Please see my additional not for details. Long and short.  Patient feels that she needs to continue to 'fight' her cancer. She has expressed ambivalent feelings about involving hospice in her care.  She has limited support here in [place], but strong support in [place] where most of her family reside.  She is torn because she wants to spend time with her two year old grandson Kaiden who lives with her but she does not get any support from her daughter.  She is currently relying on [facility] for part of the day but does not have 24/7 care.  Sanaya agreed to think about her advanced care planning. We agreed to try to get some advice from Dr. Earlie Server in the am regarding his prognosis and plan for care and then reassess goals.     Goals of Care: 1.  Code Status: Full   2. Scope of Treatment: continue full scope of curative treatments. Will check in with Dr. Earlie Server in the am.   4. Disposition: to be determined . Patient wants to go home but has an offer to go to [place] and stay with her brother and sister in law for end of life care.   3. Symptom Management:   1. Anxiety/Agitation: prn xanax 2. Pain: Patient not permitting me to change her oral meds.  She agreed to lidocaine patch and air mattress overlay 3. Lower extremity weakness. Will discuss with Dr. Earlie Server in the am.  4. Psychosocial: widowed.  Her spouse was [place] and so she  holds on to some of his traditions and magical thinking. Daughter Bryson Ha, grand son Ulice Bold  5. Spiritual: Darrick Meigs faith with a sprinkle of mysticism related to native [place] culture in her life.        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  Being treated for UTI . Asked to assist with goals of care and symptom recommendations   ROS: pain right hip and sacrum, decreased energy, anorexia    PMH:  Past Medical History  Diagnosis Date  . Depression   . Panic attacks   . Hypertension   . Smoker   . Alcohol abuse   . History of radiation therapy 07/28/13-08/25/13    rt lung,37.5Gy/97fx  . Cancer     lung ca     PSH: Past Surgical History  Procedure Laterality Date  . Tonsillectomy    . Back surgery    . Femur im nail Right 06/22/2013    Procedure: RIGHT HIP GAMMA NAIL FIXATION   ;  Surgeon: Renette Butters, MD;  Location: [facility];  Service: Orthopedics;  Laterality: Right;  . Video bronchoscopy with endobronchial ultrasound N/A 06/22/2013    Procedure: VIDEO BRONCHOSCOPY WITH ENDOBRONCHIAL ULTRASOUND;  Surgeon: Doree Fudge, MD;  Location: [facility];  Service: Pulmonary;  Laterality: N/A;   I have reviewed the [place] and [facility] and  If appropriate update it with new information. Allergies  Allergen Reactions  . Paxil [Paroxetine] Diarrhea, Nausea Only and Other (See Comments)    Also dizziness  . Lisinopril Diarrhea and Nausea Only  . Penicillins Hives   Scheduled Meds: . amiodarone  200 mg Oral Daily  . apixaban  5 mg Oral BID  . cholecalciferol  1,000 Units Oral Daily  . ciprofloxacin  250 mg Oral BID  . digoxin  0.125 mg Oral Daily  . diltiazem  180 mg Oral Daily  . diphenhydrAMINE  25 mg Oral QHS  . feeding supplement  (ENSURE COMPLETE)  237 mL Oral BID BM  . folic acid  1 mg Oral Daily  . gabapentin  100 mg Oral TID  . iron polysaccharides  150 mg Oral Daily  . levothyroxine  25 mcg Oral QAC breakfast  . lidocaine  1 patch Transdermal Q24H  . pantoprazole  40 mg Oral Daily  . polyethylene glycol  17 g Oral Daily  . potassium chloride SA  20 mEq Oral Daily  . thiamine  100 mg Oral Daily   Continuous Infusions: . 0.9 % NaCl with KCl 20 mEq / L 100 mL/hr at 02/23/14 1739   PRN Meds:.acetaminophen, acetaminophen, albuterol, ALPRAZolam, HYDROcodone-acetaminophen, ondansetron (ZOFRAN) IV, ondansetron, oxyCODONE    BP 105/58  Pulse 81  Temp(Src) 97.6 F (36.4 C) (Oral)  Resp 18  Ht 5\' 3"  (1.6 m)  Wt 50.6 kg (111 lb 8.8 oz)  BMI 19.77 kg/m2  SpO2 93%   PPS: 20-30%   Intake/Output Summary (Last 24 hours) at 02/23/14 1744 Last data filed at 02/23/14 1059  Gross per 24 hour  Intake    120 ml  Output    500 ml  Net   -380 ml   LBM:9/9 Physical Exam:  General: no acute distress other than fatigue, smiling HEENT:  PERRL, EOMI, anicteric , mm dry , alopecia Chest:   Decreased but clear, no RRW, temporal wasting and suken eyes CVS: regular, S1,S2 Abdomen: soft, schaphoid, not tender, positive bowel sounds Ext: contractures of right lower ext, , trace edema, right limb in boot, wasting of hands  Neuro: awake , alert , oriented with capacity for decision making, some magical thinking as coping mechanism  Labs: CBC    Component Value Date/Time   WBC 6.7 02/23/2014 0415   WBC 7.3 01/11/2014 0952   RBC 3.16* 02/23/2014 0415   RBC 2.87* 01/11/2014 0952   HGB 9.7* 02/23/2014 0415   HGB 9.4* 01/11/2014 0952   HCT 28.8* 02/23/2014 0415   HCT 28.6* 01/11/2014 0952   PLT 249 02/23/2014 0415   PLT 127* 01/11/2014 0952   MCV 91.1 02/23/2014 0415   MCV 99.3 01/11/2014 0952   MCH 30.7 02/23/2014 0415   MCH 32.8 01/11/2014 0952   MCHC 33.7 02/23/2014 0415   MCHC 33.0 01/11/2014 0952   RDW 14.8 02/23/2014 0415   RDW  16.7* 01/11/2014 0952   LYMPHSABS 0.7* 01/11/2014 0952   LYMPHSABS 0.5* 08/11/2013 1730   MONOABS 1.0* 01/11/2014 0952   MONOABS 0.7 08/11/2013 1730   EOSABS 0.0 01/11/2014 0952   EOSABS 0.1 08/11/2013 1730   BASOSABS 0.0 01/11/2014 0952   BASOSABS 0.0 08/11/2013 1730  CMP     Component Value Date/Time   NA 136* 02/23/2014 0415   NA 138 01/11/2014 0952   K 3.2* 02/23/2014 0415   K 3.5 01/11/2014 0952   CL 93* 02/23/2014 0415   CO2 30 02/23/2014 0415   CO2 27 01/11/2014 0952   GLUCOSE 93 02/23/2014 0415   GLUCOSE 122 01/11/2014 0952   BUN 9 02/23/2014 0415   BUN 11.8 01/11/2014 0952   CREATININE 0.50 02/23/2014 0415   CREATININE 0.7 01/11/2014 0952   CALCIUM 9.6 02/23/2014 0415   CALCIUM 9.9 01/11/2014 0952   PROT 6.6 02/23/2014 0415   PROT 7.3 01/11/2014 0952   ALBUMIN 2.8* 02/23/2014 0415   ALBUMIN 3.2* 01/11/2014 0952   AST 19 02/23/2014 0415   AST 16 01/11/2014 0952   ALT 13 02/23/2014 0415   ALT 16 01/11/2014 0952   ALKPHOS 104 02/23/2014 0415   ALKPHOS 105 01/11/2014 0952   BILITOT 0.4 02/23/2014 0415   BILITOT 0.41 01/11/2014 0952   GFRNONAA >90 02/23/2014 0415   GFRAA >90 02/23/2014 0415     CT scan of the chest abdomen and pelvis: Reviewed/Impressions: 1. Stable post treatment change of the medial aspect of the right  upper lobe and right hilum, similar to the 01/07/2014 examination.  No evidence of progressive metastatic disease within the chest.  2. Coronary artery calcifications.  Abdomen and Pelvis Impression:  1. Progressive metastatic disease within the liver with dominant  lesion within the left lobe of the liver now measuring 4.6 cm in  diameter, previously, 2.8 cm.  2. Post intra medullary and dynamic screw fixation of the right  femur and femoral neck, incompletely imaged and evaluated - further  evaluation could be performed with dedicated radiographs of the  right hip and/or femur as clinically indicated.  3. Severe degenerative change of the right hip.  4. Unchanged ill-defined area  of linear sclerosis within the right  sacral ala with differential considerations again including osseous  metastatic disease versus is a sacral insufficiency fracture.      Time In Time Out Total Time Spent with Patient Total Overall Time  400 pm  525 pm  85  min 85 min    Greater than 50%  of this time was spent counseling and coordinating care related to the above assessment and plan.    Amal Renbarger L. Lovena Le, MD MBA The Palliative Medicine Team at [facility] Phone: [phone] Pager: [phone] ( Use team phone after hours)

## 2014-02-23 NOTE — Consult Note (Signed)
Patient Stephanie Oliver      DOB: 1951/06/12      QJJ:[id number]  Full note to follow:  Met with patient.  Stephanie Oliver is wide awake and smiling.  She relates that she is happy to be feeling better.  She digressed as we talked about her health into telling about her husband's death from cancer. She related how important if was to her to be able to take him home and have him die at home.  She emphasizes some of her beliefs that are rooted in her husband's [employer].  She was proud to have provided a [employer] and burial.  She reveals in the the thought of her grandchild Stephanie Oliver. He is the light of her life.  She reports that it is still important for her to fight this cancer and told me that she would like someone to bring this all together for her.  She specifically asked to hear from Dr. Earlie Server about his thoughts on her current state.  She is not adverse to hospice care and in fact used hospice with her husband.  I asked her permission to update her on the recent findings since she had asked and she opened the door to talk in terms of "good news"" bad news". She related a story where she asked a person to " start with the good news"".  So I told her the good news was that the cancer in her lungs was status quo and did not show signs of progression.  The bad news - was that the cancer in her liver had increased and we were concerned about how it might be impacting on her spinal cord since she was having worsening ability to walk.  We then finished up with the good news that the CT head did not show disease related to her cancer at this time.  She took this all well and is looking forward to talking with Dr. Earlie Server about what it would mean for her future.  She states her pain is marginal but she doesn't;t want to use anything stronger.  She reports a nephew overdosed on Morphine and so she sees this as a betrayal of her brother since it was his only son.  I let her know that we can do other things to  help her pain and so she is aggreeable to a lidocaine patch and an air mattress overlay.  She remains resistant to using a foley catheter .  She requires two person assist and is having trouble standing and walking.   At this time:  Recommend:  1.  Full code- she is going to think about considering a change but at this time she equates this with "fighting"  2.  Pain: add lidocaine patch and air mattress, continue oxycodone at her request she does not want to change to 'stronger" medication  3.  In ability to ambulate:  Should we image her spine or consult neuro/ Steroids?  I realize she may have a sacral insufficiency fracture but it seems that the lower extremity deficit is greater than that type injury.. Will discuss with Dr. Earlie Server.  4.  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  Patient states she would like to go home by this weekend. Please see my previous note regarding family support.   Time 400 pm- 540 pm  She has given me permission to update her brother and sister.   Stephanie Eugenio L. Lovena Le, MD MBA The Palliative Medicine Team at [facility] Phone: [phone] Pager:  [phone] ( Use team phone after hours)

## 2014-02-23 NOTE — Progress Notes (Signed)
02/23/2014 A. Sherlyn Ebbert RNCM 1535pm EDCM left voice message for Erasmo Downer of [place] to make her aware of patient's admission.

## 2014-02-23 NOTE — Progress Notes (Signed)
ANTICOAGULATION CONSULT NOTE - Initial Consult  Pharmacy Consult for apixaban Indication: atrial fibrillation  Allergies  Allergen Reactions  . Paxil [Paroxetine] Diarrhea, Nausea Only and Other (See Comments)    Also dizziness  . Lisinopril Diarrhea and Nausea Only  . Penicillins Hives    Patient Measurements: Height: 5\' 3"  (160 cm) Weight: 111 lb 8.8 oz (50.6 kg) IBW/kg (Calculated) : 52.4 Heparin Dosing Weight:   Vital Signs: Temp: 97.6 F (36.4 C) (09/08 2253) Temp src: Oral (09/08 2253) BP: 116/60 mmHg (09/08 2253) Pulse Rate: 79 (09/08 2253)  Labs:  Recent Labs  02/22/14 1440 02/23/14 0415  HGB 10.3* 9.7*  HCT 31.5* 28.8*  PLT 277 249  LABPROT  --  14.8  INR  --  1.16  CREATININE 0.54 0.50    Estimated Creatinine Clearance: 56.7 ml/min (by C-G formula based on Cr of 0.5).   Medical History: Past Medical History  Diagnosis Date  . Depression   . Panic attacks   . Hypertension   . Smoker   . Alcohol abuse   . History of radiation therapy 07/28/13-08/25/13    rt lung,37.5Gy/61fx  . Cancer     lung ca    Medications:  Scheduled:  . amiodarone  200 mg Oral Daily  . apixaban  5 mg Oral BID  . cholecalciferol  1,000 Units Oral Daily  . ciprofloxacin  400 mg Intravenous Q12H  . digoxin  0.125 mg Oral Daily  . diltiazem  180 mg Oral Daily  . diphenhydrAMINE  25 mg Oral QHS  . feeding supplement (ENSURE COMPLETE)  237 mL Oral BID BM  . folic acid  1 mg Oral Daily  . furosemide  40 mg Oral Daily  . gabapentin  100 mg Oral TID  . iron polysaccharides  150 mg Oral Daily  . levothyroxine  25 mcg Oral QAC breakfast  . pantoprazole  40 mg Oral Daily  . polyethylene glycol  17 g Oral Daily  . potassium chloride SA  20 mEq Oral Daily  . thiamine  100 mg Oral Daily    Assessment: Patient on apixaban prior to admission for afib.  Pharmacy to assist in continuing medication.    Goal of Therapy:  apixaban dosed based on patient weight and renal function      Plan:  Will continue home dose of apixaban 5mg  po bid.  Tyler Deis, Shea Stakes [place] 02/23/2014,5:58 AM

## 2014-02-23 NOTE — Progress Notes (Addendum)
Progress Note   Stephanie Oliver UTM:[id number] DOB: 1950/10/11 DOA: 02/22/2014 PCP: [name], MD   Brief Narrative:   Stephanie Oliver is an 63 y.o. female with a PMH of metastatic non-small cell lung cancer status post palliative chemotherapy and radiation therapy, depression, hypertension and osteoporosis was referred to the hospital by her oncologist for evaluation of progressive weakness. The patient clearly cannot adequately care for herself and does not have consistent 24-hour supervision in the home. She cannot bear weight secondary significant lower extremity weakness. Upon initial evaluation in the ED, the patient was noted to have significant lower extremity weakness and a CT scan of her abdomen and pelvis revealed possible sacral insufficiency fracture versus metastatic involvement of the sacrum. Urinalysis was negative for signs of infection. Orthopedics consultation is pending.  Assessment/Plan:   Principal Problem:   Lower extremity weakness CT findings of sacral insufficiency fracture versus metastasis to the sacrum  Orthopedics consultation pending, but physical exam findings not entirely explained by CT findings.  Will get CT head to R/O brain metastasis.  Known peripheral neuropathy with foot drop from history of treatment with Taxol likely contributory.  Continue gabapentin.  PT/OT evaluations requested.  Palliative care consulted for goals of care are given poor prognosis secondary to underlying metastatic lung cancer.  Active Problems:   Hypokalemia  Add potassium to IVF.    UTI  Many bacteria and leukocytes noted on urinalysis. Followup urine culture. Continue empiric Cipro, but switch to oral route.    Presyncope / hypotension / history of hypertension  Patient was presyncopal prior to being returned to the bed after being mobilized to the bedside commode.  Check Hemoccult stools given dark appearance.  Hypotension may be orthostatic, give 500 cc  bolus of normal saline now.  Hold antihypertensives with parameters. Discontinue Lasix for now.    Intertrochanteric fracture of right hip  Status post intra medullary and dynamic screw fixation of the right femur and femoral neck 06/22/13.    Severe protein calorie malnutrition   Patient appears cachectic. Dietitian consultation requested.    Osteoporosis  Consider outpatient treatment with a bisphosphonate.    Bipolar 1 disorder  Not currently on any mood stabilizing medications. Continue Xanax as needed.    PAF (paroxysmal atrial fibrillation)  On chronic Eliquis.  Heme check stools given black appearance. Hemoglobin 9.7.  Continue amiodarone, digoxin and Cardizem as blood pressure tolerates.      Non-small cell cancer of right lung with mediastinal lymphadenopathy  Status post palliative chemotherapy/radiation therapy with plans to repeat CT scans in 2 months for consideration of second line treatment.  Stable disease noted in chest, but progression of disease noted in liver. LFTs stable.    Foot drop / Peripheral neuropathy  Secondary history of therapy with Taxol. Continue Neurontin.    Hypothyroidism  Continue Synthroid. TSH elevated at 10.460. Check free T4.    Normocytic anemia  Continue iron supplement and folate.  Serum B12/folate WNL. Heme check stools.    DVT Prophylaxis  On therapeutic anticoagulation.  Code Status: Full. Family Communication: Caregiver at bedside. Disposition Plan: Home when stable.   IV Access:    Peripheral IV   Procedures and diagnostic studies:   Dg Pelvis 1-2 Views 02/22/2014: 1. Diffuse severe osteopenia and degenerative changes lumbar spine and both hips. 2. Open reduction internal fixation right hip fracture. This appears stable. 3. No acute abnormality.    Dg Femur Right 02/22/2014: 1. The distal transfixing femoral intra medullary rod  screw is fractured suggestive of hardware loosening. 2. Severe degenerative change  of the right hip.      Ct Chest W Contrast 02/22/2014: 1. Stable post treatment change of the medial aspect of the right upper lobe and right hilum, similar to the 01/07/2014 examination. No evidence of progressive metastatic disease within the chest. 2. Coronary artery calcifications.  Ct Abdomen Pelvis W Contrast 02/22/2014:  1. Progressive metastatic disease within the liver with dominant lesion within the left lobe of the liver now measuring 4.6 cm in diameter, previously, 2.8 cm. 2. Post intra medullary and dynamic screw fixation of the right femur and femoral neck, incompletely imaged and evaluated - further evaluation could be performed with dedicated radiographs of the right hip and/or femur as clinically indicated. 3. Severe degenerative change of the right hip. 4. Unchanged ill-defined area of linear sclerosis within the right sacral ala with differential considerations again including osseous metastatic disease versus is a sacral insufficiency fracture.     Medical Consultants:    Orthopedics  Palliative care   Other Consultants:    Physical therapy  Occupational therapy  Dietitian   Anti-Infectives:    Cipro 02/22/14--->   Subjective:    Stephanie Oliver had a presyncopal episode after the nurses got her up to the bedside commode. She was noted to have a small amount of dark liquid stool. She stiffened up and became transiently unresponsive, but quickly regained responsiveness with no loss of consciousness after being returned to the bed. Her blood pressure was low. Denied any complaints such as chest pain, nausea, diaphoresis.  Objective:    Filed Vitals:   02/22/14 2051 02/22/14 2054 02/22/14 2253 02/23/14 0651  BP:  128/74 116/60 105/58  Pulse:  83 79 81  Temp:  97.7 F (36.5 C) 97.6 F (36.4 C) 97.6 F (36.4 C)  TempSrc:  Oral Oral Oral  Resp:  19 18 18   Height: 5\' 3"  (1.6 m)  5\' 3"  (1.6 m)   Weight: 50.1 kg (110 lb 7.2 oz)  50.6 kg (111 lb 8.8 oz)   SpO2:  93%  96% 93%    Intake/Output Summary (Last 24 hours) at 02/23/14 1356 Last data filed at 02/23/14 1059  Gross per 24 hour  Intake    120 ml  Output    500 ml  Net   -380 ml    Exam: Gen:  NAD, cachectic-appearing Cardiovascular:  RRR, No M/R/G Respiratory:  Lungs CTAB Gastrointestinal:  Abdomen soft, NT/ND, + BS Extremities:  No C/E/C   Data Reviewed:    Labs: Basic Metabolic Panel:  Recent Labs Lab 02/22/14 1440 02/23/14 0415  NA 138 136*  K 3.7 3.2*  CL 92* 93*  CO2 32 30  GLUCOSE 86 93  BUN 11 9  CREATININE 0.54 0.50  CALCIUM 9.9 9.6   GFR Estimated Creatinine Clearance: 56.7 ml/min (by C-G formula based on Cr of 0.5). Liver Function Tests:  Recent Labs Lab 02/22/14 1531 02/23/14 0415  AST 25 19  ALT 15 13  ALKPHOS 117 104  BILITOT 0.4 0.4  PROT 7.4 6.6  ALBUMIN 3.3* 2.8*   Coagulation profile  Recent Labs Lab 02/23/14 0415  INR 1.16    CBC:  Recent Labs Lab 02/22/14 1440 02/23/14 0415  WBC 9.5 6.7  HGB 10.3* 9.7*  HCT 31.5* 28.8*  MCV 93.5 91.1  PLT 277 249   BNP (last 3 results)  Recent Labs  06/23/13 0830 06/24/13 0309  PROBNP 680.4* 995.0*   Thyroid  function studies:  Recent Labs  02/22/14 1531  TSH 10.460*   Anemia work up:  Recent Labs  02/23/14 0745  VITAMINB12 473  FOLATE >20.0   Microbiology No results found for this or any previous visit (from the past 240 hour(s)).   Medications:   . amiodarone  200 mg Oral Daily  . apixaban  5 mg Oral BID  . cholecalciferol  1,000 Units Oral Daily  . ciprofloxacin  400 mg Intravenous Q12H  . digoxin  0.125 mg Oral Daily  . diltiazem  180 mg Oral Daily  . diphenhydrAMINE  25 mg Oral QHS  . feeding supplement (ENSURE COMPLETE)  237 mL Oral BID BM  . folic acid  1 mg Oral Daily  . furosemide  40 mg Oral Daily  . gabapentin  100 mg Oral TID  . iron polysaccharides  150 mg Oral Daily  . levothyroxine  25 mcg Oral QAC breakfast  . pantoprazole  40 mg Oral Daily  .  polyethylene glycol  17 g Oral Daily  . potassium chloride SA  20 mEq Oral Daily  . sodium chloride  500 mL Intravenous Once  . thiamine  100 mg Oral Daily   Continuous Infusions:   Time spent: 35 minutes with > 50% of time discussing current diagnostic test results, clinical impression and plan of care.    LOS: 1 day   Stephanie Oliver  [facility] Pager [phone]. If unable to reach me by pager, please call my cell phone at [phone].  *Please refer to [email or web address], password [email or web address] to get updated schedule on who will round on this patient, as hospitalists switch teams weekly. If 7PM-7AM, please contact night-coverage at [email or web address], password [email or web address] for any overnight needs.  02/23/2014, 1:56 PM

## 2014-02-23 NOTE — Progress Notes (Signed)
Patient HT:[name]      DOB: 11/28/1950      SEL:[id number]  Consult for PMT for goals of care and assistance with hospice care received.  Arrived to find patient had not slept most of night now soundly asleep.  Her [employer] administrator was with her.  Reviewed chart and noted note to communicate with patient's POA who is her sister in law Stephanie Oliver and brother Stephanie Oliver.  Stephanie Bors on [phone].  Left generic message on Generic voicemail.  Noted [facility] was called yesterday at families request.  Checked in with liaison for [facility] they have not received official consult at this time, likely pending [place].  Will try to clarify for them.  Await return call and will return at end of day to try to speak with patient herself. She is so very frail.   Stephanie Coddington L. Lovena Le, MD MBA The Palliative Medicine Team at [facility] Phone: [phone] Pager: [phone] ( Use team phone after hours)

## 2014-02-23 NOTE — Progress Notes (Signed)
Patient YQ:[id number] Defrancesco      DOB: [birth year]      QVZ:[id number]  Extensive conversation with patient's Sister In Matthew Folks who shared with me that Jamar has been having significant memory defects and trouble understanding her condition.  Denice Paradise ( is a Education officer, museum and professor in [place], very knowledgeable about hospice and has talked with Alondra about it in appropriate language, which Charniece responded positively to accepting hospice option).  Denice Paradise relates that Bruna has hired paid caregivers as her daughter lives with her but will not help with her care.  Denice Paradise relates that Donya does have the option to live with Denice Paradise and Dominica Severin in [place] for end of life care.  They had planned to come down to see her in October and likely were going to take her back with them at that time.    Discussed with Dr. Rockne Menghini. Will get CT head r/o mets.  I will return to talk with Teshia, later today..   Shakim Faith L. Lovena Le, MD MBA The Palliative Medicine Team at [facility] Phone: [phone] Pager: [phone] ( Use team phone after hours)

## 2014-02-24 ENCOUNTER — Inpatient Hospital Stay (HOSPITAL_COMMUNITY)

## 2014-02-24 DIAGNOSIS — IMO0002 Reserved for concepts with insufficient information to code with codable children: Secondary | ICD-10-CM

## 2014-02-24 DIAGNOSIS — Z639 Problem related to primary support group, unspecified: Secondary | ICD-10-CM

## 2014-02-24 LAB — MAGNESIUM: MAGNESIUM: 1.5 mg/dL (ref 1.5–2.5)

## 2014-02-24 LAB — BASIC METABOLIC PANEL
Anion gap: 10 (ref 5–15)
Anion gap: 15 (ref 5–15)
BUN: 9 mg/dL (ref 6–23)
BUN: 7 mg/dL (ref 6–23)
CALCIUM: 9.9 mg/dL (ref 8.4–10.5)
CHLORIDE: 95 meq/L — AB (ref 96–112)
CO2: 29 mEq/L (ref 19–32)
CO2: 25 mEq/L (ref 19–32)
Calcium: 9.2 mg/dL (ref 8.4–10.5)
Chloride: 98 mEq/L (ref 96–112)
Creatinine, Ser: 0.58 mg/dL (ref 0.50–1.10)
Creatinine, Ser: 0.49 mg/dL — ABNORMAL LOW (ref 0.50–1.10)
GLUCOSE: 160 mg/dL — AB (ref 70–99)
Glucose, Bld: 92 mg/dL (ref 70–99)
Potassium: 3.1 mEq/L — ABNORMAL LOW (ref 3.7–5.3)
Potassium: 4.7 mEq/L (ref 3.7–5.3)
SODIUM: 134 meq/L — AB (ref 137–147)
Sodium: 138 mEq/L (ref 137–147)

## 2014-02-24 LAB — CBC
HCT: 26.7 % — ABNORMAL LOW (ref 36.0–46.0)
Hemoglobin: 8.9 g/dL — ABNORMAL LOW (ref 12.0–15.0)
MCH: 30.5 pg (ref 26.0–34.0)
MCHC: 33.3 g/dL (ref 30.0–36.0)
MCV: 91.4 fL (ref 78.0–100.0)
PLATELETS: 239 10*3/uL (ref 150–400)
RBC: 2.92 MIL/uL — ABNORMAL LOW (ref 3.87–5.11)
RDW: 15 % (ref 11.5–15.5)
WBC: 6.1 10*3/uL (ref 4.0–10.5)

## 2014-02-24 MED ORDER — PREDNISONE (PAK) 5 MG PO TABS
5.0000 mg | ORAL_TABLET | Freq: Three times a day (TID) | ORAL | Status: DC
  Administered 2014-02-25: 5 mg via ORAL

## 2014-02-24 MED ORDER — MAGNESIUM SULFATE 40 MG/ML IJ SOLN
2.0000 g | Freq: Once | INTRAMUSCULAR | Status: AC
  Administered 2014-02-24: 2 g via INTRAVENOUS
  Filled 2014-02-24: qty 50

## 2014-02-24 MED ORDER — PREDNISONE (PAK) 5 MG PO TABS
5.0000 mg | ORAL_TABLET | ORAL | Status: AC
  Administered 2014-02-24: 5 mg via ORAL

## 2014-02-24 MED ORDER — POTASSIUM CHLORIDE 10 MEQ/100ML IV SOLN
10.0000 meq | INTRAVENOUS | Status: AC
  Administered 2014-02-24 (×4): 10 meq via INTRAVENOUS
  Filled 2014-02-24 (×4): qty 100

## 2014-02-24 MED ORDER — ALBUTEROL SULFATE (2.5 MG/3ML) 0.083% IN NEBU: 2.5000 mg | INHALATION_SOLUTION | Freq: Every morning | RESPIRATORY_TRACT | Status: DC

## 2014-02-24 MED ORDER — GADOBENATE DIMEGLUMINE 529 MG/ML IV SOLN
10.0000 mL | Freq: Once | INTRAVENOUS | Status: AC | PRN
  Administered 2014-02-24: 10 mL via INTRAVENOUS

## 2014-02-24 MED ORDER — PREDNISONE (PAK) 5 MG PO TABS
10.0000 mg | ORAL_TABLET | Freq: Every evening | ORAL | Status: AC
  Administered 2014-02-24: 10 mg via ORAL

## 2014-02-24 MED ORDER — BOOST PLUS PO LIQD
237.0000 mL | Freq: Two times a day (BID) | ORAL | Status: DC
  Administered 2014-02-25 – 2014-03-01 (×9): 237 mL via ORAL
  Filled 2014-02-24 (×11): qty 237

## 2014-02-24 MED ORDER — PREDNISONE (PAK) 5 MG PO TABS
10.0000 mg | ORAL_TABLET | Freq: Every morning | ORAL | Status: AC
  Administered 2014-02-24: 10 mg via ORAL
  Filled 2014-02-24: qty 21

## 2014-02-24 MED ORDER — PREDNISONE (PAK) 5 MG PO TABS: 5.0000 mg | ORAL_TABLET | Freq: Four times a day (QID) | ORAL | Status: DC

## 2014-02-24 MED ORDER — PREDNISONE (PAK) 5 MG PO TABS: 10.0000 mg | ORAL_TABLET | Freq: Every evening | ORAL | Status: DC

## 2014-02-24 NOTE — Progress Notes (Signed)
Progress Note   Stephanie Oliver STM:[id number] DOB: December 10, 1950 DOA: 02/22/2014 PCP: [name], MD   Brief Narrative:   Stephanie Oliver is an 63 y.o. female with a PMH of metastatic non-small cell lung cancer status post palliative chemotherapy and radiation therapy, depression, hypertension and osteoporosis was referred to the hospital by her oncologist for evaluation of progressive weakness. The patient clearly cannot adequately care for herself and does not have consistent 24-hour supervision in the home. She cannot bear weight secondary significant lower extremity weakness. Upon initial evaluation in the ED, the patient was noted to have significant lower extremity weakness and a CT scan of her abdomen and pelvis revealed possible sacral insufficiency fracture versus metastatic involvement of the sacrum. Urinalysis was negative for signs of infection. Orthopedics consultation is pending.  Assessment/Plan:   Principal Problem:   Lower extremity weakness, multifactorial with CT findings of sacral insufficiency fracture versus metastasis to the sacrum / fracture of screw in distal femur / severe osteoarthritis of the right hip / peripheral neuropathy all contributory  Orthopedics consultation pending, has a intramedullary rod screw fracture suggestive of hardware loosening of the right femur, which is where she is having her pain.  CT head negative for brain metastasis.  Known peripheral neuropathy with foot drop from history of treatment with Taxol likely contributory.  Continue gabapentin.  PT/OT evaluations requested.  Palliative care meds with patient 02/23/14 to discuss goals of care are given poor prognosis secondary to underlying metastatic lung cancer. Continues to want full CODE STATUS and further treatment of her cancer if indicated.  Active Problems:   Family dynamics problem  The patient has a history of "borderline personality "according to sister-in-law who lives in  [place].  She lives with her daughter who also has a personality disorder and who, according to family members in [place] and her paid caregivers, do not provide her with any care in the home. The family in [place] he is willing for her to come and live with them, however the patient prefers to remain in her home so that she can have access to her grandchild.  At this time, the patient is unsafe to return to her home situation as the family in [place] have discontinued service of her her paid caregivers.  The patient is in a fair amount of denial regarding her diagnosis and prognosis, and gives caregivers and family diferring updates regarding what the doctors have told her.    Hypokalemia  Give 4 runs of potassium today and continue potassium in IV fluids.  Check magnesium.    UTI  Many bacteria and leukocytes noted on urinalysis. Urine culture on grew 50,000 colonies of Escherichia coli. Continue empiric Cipro for 3 days of treatment. Doubt this explains her severe weakness.    Presyncope / hypotension / history of hypertension  Patient was presyncopal prior to being returned to the bed after being mobilized to the bedside commode.  Fecal occult blood negative.  Hypotension may be orthostatic, given 500 cc bolus of normal saline.  Hold antihypertensives with parameters. Lasix D/C'd 02/23/14. BP improved.    Intertrochanteric fracture of right hip  Status post intra medullary and dynamic screw fixation of the right femur and femoral neck 06/22/13.  Radiographs show fracture of the screw with lucency around the screw suggesting hardware loosening.  Orthopedics consultation pending. PT evaluation when cleared by orthosis.    Severe protein calorie malnutrition   Patient appears cachectic. Dietitian consultation requested.    Osteoporosis  Consider outpatient treatment with a bisphosphonate.    Bipolar 1 disorder  Not currently on any mood stabilizing medications. Continue  Xanax as needed.    PAF (paroxysmal atrial fibrillation)  On chronic Eliquis.  Hemoccult negative. Hemoglobin 9.7.  Continue amiodarone, digoxin and Cardizem as blood pressure tolerates.      Non-small cell cancer of right lung with mediastinal lymphadenopathy  Status post palliative chemotherapy/radiation therapy with plans to repeat CT scans in 2 months for consideration of second line treatment.  Stable disease noted in chest, but progression of disease noted in liver. LFTs stable.    Foot drop / Peripheral neuropathy  Secondary history of therapy with Taxol. Continue Neurontin.    Hypothyroidism  Continue Synthroid. TSH elevated at 10.460. Free T4 WNL, would not adjust Synthroid further. Recheck TSH in 4 weeks.    Normocytic anemia  Continue iron supplement and folate.  Serum B12/folate WNL. Stools Hemoccult negative.    DVT Prophylaxis  On therapeutic anticoagulation.  Code Status: Full. Family Communication: Caregiver at bedside, sister-in-law Ambrose Mantle [phone]. Disposition Plan: Home when stable.   IV Access:    Peripheral IV   Procedures and diagnostic studies:   Dg Pelvis 1-2 Views 02/22/2014: 1. Diffuse severe osteopenia and degenerative changes lumbar spine and both hips. 2. Open reduction internal fixation right hip fracture. This appears stable. 3. No acute abnormality.    Dg Femur Right 02/22/2014: 1. The distal transfixing femoral intra medullary rod screw is fractured suggestive of hardware loosening. 2. Severe degenerative change of the right hip.      Ct Chest W Contrast 02/22/2014: 1. Stable post treatment change of the medial aspect of the right upper lobe and right hilum, similar to the 01/07/2014 examination. No evidence of progressive metastatic disease within the chest. 2. Coronary artery calcifications.  Ct Abdomen Pelvis W Contrast 02/22/2014:  1. Progressive metastatic disease within the liver with dominant lesion within the left lobe of the  liver now measuring 4.6 cm in diameter, previously, 2.8 cm. 2. Post intra medullary and dynamic screw fixation of the right femur and femoral neck, incompletely imaged and evaluated - further evaluation could be performed with dedicated radiographs of the right hip and/or femur as clinically indicated. 3. Severe degenerative change of the right hip. 4. Unchanged ill-defined area of linear sclerosis within the right sacral ala with differential considerations again including osseous metastatic disease versus is a sacral insufficiency fracture.     Ct Head W Wo Contrast 02/23/2014: Atrophy and chronic microvascular ischemia.  Negative for metastatic disease.      Medical Consultants:    Orthopedics  Dr. Billey Chang, Palliative care   Other Consultants:    Physical therapy  Occupational therapy  Dietitian   Anti-Infectives:    Cipro 02/22/14--->   Subjective:    Stephanie Oliver complains of right leg pain. Appetite is intact. Ate 2-1/2 pancakes for breakfast. No nausea or vomiting. Bowels moved recently.  Objective:    Filed Vitals:   02/22/14 2253 02/23/14 0651 02/23/14 2144 02/24/14 0534  BP:  105/58 102/60 112/55  Pulse:  81 88 88  Temp:  97.6 F (36.4 C) 98 F (36.7 C) 98.1 F (36.7 C)  TempSrc:  Oral Oral Oral  Resp:  18 16 18   Height: 5\' 3"  (1.6 m)     Weight: 50.6 kg (111 lb 8.8 oz)   50.667 kg (111 lb 11.2 oz)  SpO2:  93% 97% 98%    Intake/Output Summary (Last 24 hours)  at 02/24/14 0802 Last data filed at 02/24/14 0535  Gross per 24 hour  Intake    460 ml  Output   1100 ml  Net   -640 ml    Exam: Gen:  NAD, cachectic-appearing Cardiovascular:  RRR, No M/R/G Respiratory:  Lungs CTAB Gastrointestinal:  Abdomen soft, NT/ND, + BS Extremities:  No C/E/C   Data Reviewed:    Labs: Basic Metabolic Panel:  Recent Labs Lab 02/22/14 1440 02/23/14 0415 02/24/14 0400  NA 138 136* 134*  K 3.7 3.2* 3.1*  CL 92* 93* 95*  CO2 32 30 29  GLUCOSE 86 93 92   BUN 11 9 9   CREATININE 0.54 0.50 0.58  CALCIUM 9.9 9.6 9.2   GFR Estimated Creatinine Clearance: 56.9 ml/min (by C-G formula based on Cr of 0.58). Liver Function Tests:  Recent Labs Lab 02/22/14 1531 02/23/14 0415  AST 25 19  ALT 15 13  ALKPHOS 117 104  BILITOT 0.4 0.4  PROT 7.4 6.6  ALBUMIN 3.3* 2.8*   Coagulation profile  Recent Labs Lab 02/23/14 0415  INR 1.16    CBC:  Recent Labs Lab 02/22/14 1440 02/23/14 0415 02/24/14 0400  WBC 9.5 6.7 6.1  HGB 10.3* 9.7* 8.9*  HCT 31.5* 28.8* 26.7*  MCV 93.5 91.1 91.4  PLT 277 249 239   BNP (last 3 results)  Recent Labs  06/23/13 0830 06/24/13 0309  PROBNP 680.4* 995.0*   Thyroid function studies:  Recent Labs  02/22/14 1531  TSH 10.460*   Anemia work up:  Recent Labs  02/23/14 0745  VITAMINB12 473  FOLATE >20.0   Microbiology Recent Results (from the past 240 hour(s))  URINE CULTURE     Status: None   Collection Time    02/22/14  5:16 PM      Result Value Ref Range Status   Specimen Description URINE, CATHETERIZED   Final   Special Requests NONE   Final   Culture  Setup Time     Final   Value: 02/23/2014 04:49     Performed at [facility]     Final   Value: 50,000 COLONIES/ML     Performed at [facility]   Culture     Final   Value: ESCHERICHIA COLI     Performed at [facility]   Report Status PENDING   Incomplete     Medications:   . amiodarone  200 mg Oral Daily  . apixaban  5 mg Oral BID  . cholecalciferol  1,000 Units Oral Daily  . ciprofloxacin  250 mg Oral BID  . digoxin  0.125 mg Oral Daily  . diltiazem  180 mg Oral Daily  . diphenhydrAMINE  25 mg Oral QHS  . feeding supplement (ENSURE COMPLETE)  237 mL Oral BID BM  . folic acid  1 mg Oral Daily  . gabapentin  100 mg Oral TID  . iron polysaccharides  150 mg Oral Daily  . levothyroxine  25 mcg Oral QAC breakfast  . lidocaine  1 patch Transdermal Q24H  . pantoprazole  40 mg Oral  Daily  . polyethylene glycol  17 g Oral Daily  . potassium chloride SA  20 mEq Oral Daily  . thiamine  100 mg Oral Daily   Continuous Infusions: . 0.9 % NaCl with KCl 20 mEq / L 100 mL/hr at 02/24/14 0740    Time spent: 25 minutes.    LOS: 2 days   RAMA,CHRISTINA  [facility] Pager [phone]. If unable to reach me by pager, please call my cell phone at [phone].  *Please refer to [email or web address], password [email or web address] to get updated schedule on who will round on this patient, as hospitalists switch teams weekly. If 7PM-7AM, please contact night-coverage at [email or web address], password [email or web address] for any overnight needs.  02/24/2014, 8:02 AM

## 2014-02-24 NOTE — Evaluation (Addendum)
Occupational Therapy Evaluation Patient Details Name: Stephanie Oliver MRN: [id number] DOB: May 26, 1951 Today's Date: 02/24/2014    History of Present Illness 63 yo female admitted with bil LE weakness, R hip/sacral area pain. Hx of NSCLC, diffuse osteopenia, mets to liver, lung, chemo induced neuropathy, ETOH abuse, R femur IM nail 06/22/13.  Ortho has pt WBAT and MRI of back is pending   Clinical Impression   Pt was admitted for the above.  Pt was at home with daughter and caregivers, alone 9 pm - 9 am and 3 hours during the day.  Pt has had progressive weakness of bil LEs over a few weeks and has needed increased assistance with adls.  She had difficulty repositioning herself in bed.  Pt will benefit from skilled OT to increase safety and independence with adls.  Initial focus will be on transfers to drop arm commode and UB adls.      Follow Up Recommendations  CIR    Equipment Recommendations   (possibly drop arm 3:1 commode and sliding board, depending upon progress)    Recommendations for Other Services       Precautions / Restrictions Precautions Precautions: Fall Precaution Comments: high fall risk Restrictions Weight Bearing Restrictions: No      Mobility Bed Mobility Overal bed mobility: Needs Assistance Bed Mobility: Rolling;Sidelying to Sit;Sit to Sidelying Rolling: Min assist Sidelying to sit: Mod assist;+2 for physical assistance;+2 for safety/equipment     Sit to sidelying: Mod assist;+2 for physical assistance;+2 for safety/equipment General bed mobility comments: assist for trunk and bil LEs. Utilized bedpad for positioning, scooting. Increased time.   Transfers Overall transfer level: Needs assistance Equipment used: Rolling walker (2 wheeled) Transfers: Sit to/from Stand Sit to Stand: Max assist;+2 physical assistance;+2 safety/equipment;From elevated surface   Squat pivot transfers: +2 physical assistance;+2 safety/equipment;Max assist    Lateral/Scoot  Transfers: Max assist;+2 physical assistance General transfer comment: assist to rise, stabilize, control descent. Pt unable to achieve full upright trunk posture due to pain . Bil LE buckling-pt has to lock out both knees in order keep them extended. Stood ~10 seconds with significant +2 assist before knees buckled    Balance Overall balance assessment: Needs assistance Sitting-balance support: Bilateral upper extremity supported Sitting balance-Leahy Scale: Fair Sitting balance - Comments: Sat EOB and on BSC with close Min guard assist once positioned.                                    ADL Overall ADL's : Needs assistance/impaired Eating/Feeding: Set up;Bed level   Grooming: Set up;Bed level   Upper Body Bathing: Min guard;Sitting   Lower Body Bathing: +2 for physical assistance;Maximal assistance;Bed level   Upper Body Dressing : Minimal assistance;Sitting   Lower Body Dressing: Maximal assistance;+2 for physical assistance;Bed level   Toilet Transfer: Maximal assistance;+2 for physical assistance;Transfer board;BSC;Requires drop arm   Toileting- Clothing Manipulation and Hygiene: Total assistance;Sitting/lateral lean         General ADL Comments: pt has extraneous movements at times with UEs--awkward.  Unsure if she was trying to work around [place].  Provided built up foam and lidded cup to decrease efforts during self-feeding.  Tried sliding board to drop arm commode: pt hasn't used this before and she was fearful.  Lifted back to bed--attempted squat pivot but pt's feet did not stay on floor     Vision  Perception     Praxis      Pertinent Vitals/Pain Pain Assessment: 0-10 Pain Score: 5  (during movement) Pain Location: back Pain Descriptors / Indicators: Aching Pain Intervention(s): Limited activity within patient's tolerance;Repositioned     Hand Dominance Right   Extremity/Trunk Assessment Upper Extremity  Assessment Upper Extremity Assessment: Generalized weakness (grossly 3+/5 based on function; grips 4/5 has neuropathy)   Lower Extremity Assessment per PT: Lower Extremity Assessment: RLE deficits/detail;LLE deficits/detail RLE Deficits / Details: hip flex 2/5, knee ext 3/5, knee flex 2/5, DF/PF 0/5. Can range ankle to neutral. Encouraged pt to wear PRAFO LLE Deficits / Details: hip flex 3+/5, knee ext 3+/5, knee flex 3/5, DF/PF 0/5. Can range ankle to neutral. Encouraged pt to wear PRAFO   Cervical / Trunk Assessment Cervical / Trunk Assessment: Kyphotic   Communication Communication Communication: No difficulties   Cognition Arousal/Alertness: Awake/alert Behavior During Therapy: WFL for tasks assessed/performed Overall Cognitive Status: Within Functional Limits for tasks assessed                     General Comments       Exercises       Shoulder Instructions      Home Living Family/patient expects to be discharged to:: Private residence Living Arrangements: Children Available Help at Discharge: Family;Available PRN/intermittently Type of Home: House Home Access: Stairs to enter;Ramped entrance Entrance Stairs-Number of Steps: 2  Entrance Stairs-Rails: None Home Layout: Multi-level;Bed/bath upstairs     Bathroom Shower/Tub: Tub/shower unit;Curtain Shower/tub characteristics: Architectural technologist: Standard     Home Equipment: Environmental consultant - 2 wheels;Bedside commode;Tub bench   Additional Comments: lives with daughter and 41 year old.  Has caregivers:  alone 9-9 and 3 hours during the day      Prior Functioning/Environment Level of Independence: Needs assistance        Comments: recent decline in function    OT Diagnosis: Generalized weakness   OT Problem List: Decreased strength;Decreased activity tolerance;Impaired balance (sitting and/or standing);Decreased knowledge of use of DME or AE;Pain;Impaired sensation   OT Treatment/Interventions:  Self-care/ADL training;DME and/or AE instruction;Therapeutic activities;Patient/family education;Balance training    OT Goals(Current goals can be found in the care plan section) Acute Rehab OT Goals Patient Stated Goal: to get stronger OT Goal Formulation: With patient Time For Goal Achievement: 03/10/14 Potential to Achieve Goals: Good ADL Goals Pt Will Transfer to Toilet: with min assist;with +2 assist;with transfer board;bedside commode Additional ADL Goal #1: Pt will complete UB adls and grooming with set up in supported sitting position Additional ADL Goal #2: pt will perform 2 sets of 10 AROM exercises to bil shoulders to increase strength/endurance for adls  OT Frequency: Min 2X/week   Barriers to D/C:            Co-evaluation              End of Session    Activity Tolerance:   Patient left:     Time: 5974-1638 and 1520-1525 OT Time Calculation (min): 45 min Charges:  OT General Charges $OT Visit: 1 Procedure OT Evaluation $Initial OT Evaluation Tier I: 1 Procedure OT Treatments $Self Care/Home Management : 23-37 mins G-Codes:    Liese Dizdarevic 2014/03/07, 3:55 PM   Lesle Chris, OTR/L [phone] 07-Mar-2014

## 2014-02-24 NOTE — Progress Notes (Signed)
Rehab Admissions Coordinator Note:  Patient was screened by Cleatrice Burke for appropriateness for an Inpatient Acute Rehab Consult per PT recommendation.  At this time, we are recommending [place] unless Family from our of town can assist in her care long term. Patient previously at [facility] rehab 06/2013 and went home with hired caregivers for a few weeks. Her functional level has declined since 06/2013, and inpt rehab venue is only a short length of stay with pt who has long term care issues in her home. If family from out of town willing to assist long term in her physical care, then an inpt rehab consult would be recommended. Pt previously had [employer] and [employer] caregivers provided short term assistance at home.  Cleatrice Burke 02/24/2014, 3:50 PM  I can be reached at [phone].

## 2014-02-24 NOTE — Progress Notes (Signed)
PT Cancellation Note  Patient Details Name: Stephanie Oliver MRN: [id number] DOB: March 01, 1951   Cancelled Treatment:    Reason Eval/Treat Not Completed: Other (comment) (Will hold PT/OT evaluations until after ortho consult and further recommendations. Will check back another time/day. Thanks. )   Josiah Lobo, MPT 02/24/2014, 11:07 AM  And Lesle Chris, OTR/L [phone] 02/24/2014

## 2014-02-24 NOTE — Consult Note (Signed)
ORTHOPAEDIC CONSULTATION  REQUESTING PHYSICIAN: Venetia Maxon Rama, MD  Chief Complaint: RLE pain  HPI: Tarynn Garling is a 63 y.o. female who had IM nailing of a R hip fracture on 06/2013. It was slow to heal but healed after some subsidence and distal interlock autodynamization. Currently she complains of Bilateral foot drop and pain in her posterior leg.   Past Medical History  Diagnosis Date  . Depression   . Panic attacks   . Hypertension   . Smoker   . Alcohol abuse   . History of radiation therapy 07/28/13-08/25/13    rt lung,37.5Gy/60f  . Cancer     lung ca   Past Surgical History  Procedure Laterality Date  . Tonsillectomy    . Back surgery    . Femur im nail Right 06/22/2013    Procedure: RIGHT HIP GAMMA NAIL FIXATION   ;  Surgeon: TRenette Butters MD;  [facility]  Service: Orthopedics;  Laterality: Right;  . Video bronchoscopy with endobronchial ultrasound N/A 06/22/2013    Procedure: VIDEO BRONCHOSCOPY WITH ENDOBRONCHIAL ULTRASOUND;  Surgeon: KDoree Fudge MD;  [facility] [facility]  Service: Pulmonary;  Laterality: N/A;   History   Social History  . Marital Status: Widowed    Spouse Name: N/A    Number of Children: N/A  . Years of Education: N/A   Social History Main Topics  . Smoking status: Former Smoker -- 1.50 packs/day    Types: Cigarettes    Quit date: 06/18/2013  . Smokeless tobacco: Never Used  . Alcohol Use: 2.4 oz/week    4 Shots of liquor per week     Comment: patient quit drinking 06/18/2013  . Drug Use: No  . Sexual Activity: Not Currently   Other Topics Concern  . None   Social History Narrative   Heavy smoker till 06/17/13, smoked from age [id number] per day   Current moderate to heavy drinker, drinks 3-4 beers daily, if she cannot get the beer she drinks 3-4 shots of whiskey daily.                                           Lives currently with her daughter and 167-monthld granddaughter at home   Next of kin = [name] phone  number [phone] Used to work until the age of [age] then went into early retirement   Previously has had suicidal attempt   [place]  [place]  Problem Relation Age of Onset  . CAD Father   . Arthritis Mother    Allergies  Allergen Reactions  . Paxil [Paroxetine] Diarrhea, Nausea Only and Other (See Comments)    Also dizziness  . Lisinopril Diarrhea and Nausea Only  . Penicillins Hives   Prior to Admission medications   Medication Sig Start Date End Date Taking? Authorizing Provider  albuterol (PROVENTIL HFA;VENTOLIN HFA) 108 (90 BASE) MCG/ACT inhaler Inhale 2 puffs into the lungs 2 (two) times daily as needed.    Yes Historical Provider, MD  ALPRAZolam (XDuanne Moron0.5 MG tablet Take 0.5 mg by mouth 2 (two) times daily as needed for anxiety.   Yes Historical Provider, MD  amiodarone (PACERONE) 200 MG tablet Take 1 tablet (200 mg total) by mouth daily. 08/20/13  Yes AjBirdie RiddleMD  apixaban (ELIQUIS) 5 MG TABS tablet Take 1 tablet (5 mg total) by mouth 2 (two) times daily. 07/13/13  Yes Ivan Anchors Love, PA-C  Calcium Carbonate (CALTRATE 600 PO) Take 1 tablet by mouth daily.   Yes Historical Provider, MD  cholecalciferol (VITAMIN D) 1000 UNITS tablet Take 1,000 Units by mouth daily.   Yes Historical Provider, MD  digoxin (LANOXIN) 0.125 MG tablet Take 1 tablet (0.125 mg total) by mouth daily. 08/15/13  Yes Birdie Riddle, MD  diltiazem (CARDIZEM CD) 180 MG 24 hr capsule Take 180 mg by mouth daily.   Yes Historical Provider, MD  diphenhydrAMINE (BENADRYL) 25 MG tablet Take 25 mg by mouth at bedtime.   Yes Historical Provider, MD  feeding supplement, ENSURE COMPLETE, (ENSURE COMPLETE) LIQD Take 237 mLs by mouth 2 (two) times daily between meals. 06/29/13  Yes Bobby Rumpf [place], PA-C  folic acid (FOLVITE) 1 MG tablet Take 1 tablet (1 mg total) by mouth daily. 07/13/13  Yes Ivan Anchors Love, PA-C  furosemide (LASIX) 40 MG  tablet Take 40 mg by mouth daily. 08/15/13  Yes Birdie Riddle, MD  gabapentin (NEURONTIN) 100 MG capsule Take 1 capsule (100 mg total) by mouth 3 (three) times daily. 02/03/14  Yes Curt Bears, MD  iron polysaccharides (NIFEREX) 150 MG capsule Take 1 capsule (150 mg total) by mouth daily. 07/13/13  Yes Ivan Anchors Love, PA-C  levalbuterol (XOPENEX) 0.63 MG/3ML nebulizer solution Take 0.63 mg by nebulization every 4 (four) hours as needed for wheezing or shortness of breath.   Yes Historical Provider, MD  levothyroxine (SYNTHROID, LEVOTHROID) 25 MCG tablet Take 25 mcg by mouth daily before breakfast.   Yes Historical Provider, MD  Multiple Vitamin (MULTIVITAMIN WITH MINERALS) TABS tablet Take 3 tablets by mouth daily.   Yes Historical Provider, MD  oxyCODONE (OXY IR/ROXICODONE) 5 MG immediate release tablet Take 5 mg by mouth 2 (two) times daily as needed for severe pain.   Yes Historical Provider, MD  pantoprazole (PROTONIX) 40 MG tablet Take 40 mg by mouth daily.   Yes Historical Provider, MD  polyethylene glycol (MIRALAX / GLYCOLAX) packet Take 17 g by mouth daily.   Yes Historical Provider, MD  potassium chloride SA (K-DUR,KLOR-CON) 20 MEQ tablet Take 1 tablet (20 mEq total) by mouth daily. 08/15/13  Yes Birdie Riddle, MD  thiamine 100 MG tablet Take 1 tablet (100 mg total) by mouth daily. 07/13/13  Yes Bary Leriche, PA-C   Dg Pelvis 1-2 Views  02/22/2014   CLINICAL DATA:  Right foot drop.  Pain.  EXAM: PELVIS - 1-2 VIEW  COMPARISON:  CT 02/22/2014 and 01/08/1999 50.  FINDINGS: Prominent degenerative changes noted lumbar spine and both hips. Diffuse osteopenia. Patient has had prior open reduction internal fixation of right hip fracture, this appears stable. No acute bony abnormality . Contrast noted in the colon and the bladder from prior CT.  IMPRESSION: 1. Diffuse severe osteopenia and degenerative changes lumbar spine and both hips. 2. Open reduction internal fixation right hip fracture. This appears  stable. 3. No acute abnormality.   Electronically Signed   By: Marcello Moores  Register   On: 02/22/2014 21:27   Dg Femur Right  02/22/2014   CLINICAL DATA:  Post right hip arthroplasty (06/2013) now with right foot drop and pain involving the right groin.  EXAM: RIGHT FEMUR - 2 VIEW  COMPARISON:  AP pelvis radiographs - earlier same day; CT the chest, abdomen and pelvis - earlier same day; right hip  radiographs - 06/21/2013; right knee radiographs - 07/13/2013  FINDINGS: Osteopenia. Post intra medullary fixation of the femur and dynamic screw fixation of the right femoral neck.  There is persistent lucency about the comminuted intertrochanteric femur fracture site without displacement. No definite acute fracture or dislocation.  Severe degenerative change the right hip with joint space loss, subchondral sclerosis and osteophytosis.  The cancellous screw within the distal diaphysis of the femur is fractured.  IMPRESSION: 1. The distal transfixing femoral intra medullary rod screw is fractured suggestive of hardware loosening. 2. Severe degenerative change of the right hip.   Electronically Signed   By: Sandi Mariscal M.D.   On: 02/22/2014 21:32   Ct Head W Wo Contrast  02/23/2014   CLINICAL DATA:  Lung cancer.  Rule out metastatic disease  EXAM: CT HEAD WITHOUT AND WITH CONTRAST  TECHNIQUE: Contiguous axial images were obtained from the base of the skull through the vertex without and with intravenous contrast  CONTRAST:  158m OMNIPAQUE IOHEXOL 300 MG/ML  SOLN  COMPARISON:  CT head 06/23/2013  FINDINGS: Mild atrophy. Moderate chronic microvascular ischemic change in the white matter, similar to the prior study.  Negative for acute infarct. Negative for hemorrhage or mass. No enhancing lesions.  No skull lesions.  IMPRESSION: Atrophy and chronic microvascular ischemia.  Negative for metastatic disease.   Electronically Signed   By: CFranchot GalloM.D.   On: 02/23/2014 16:23   Ct Chest W Contrast  02/22/2014   CLINICAL  DATA:  Right-sided weakness radiating to the right hip and leg for 1 week. History of lung, liver and hip cancer.  EXAM: CT CHEST, ABDOMEN, AND PELVIS WITH CONTRAST  TECHNIQUE: Multidetector CT imaging of the chest, abdomen and pelvis was performed following the standard protocol during bolus administration of intravenous contrast.  CONTRAST:  579mOMNIPAQUE IOHEXOL 300 MG/ML SOLN, 8032mMNIPAQUE IOHEXOL 300 MG/ML SOLN  COMPARISON:  CT the chest, abdomen pelvis - 01/07/2021  FINDINGS: CT CHEST FINDINGS  Ill-defined soft tissue about the medial aspect of the right upper lobe is grossly unchanged, measuring approximately 1.4 x 3.3 cm (image 27, series 7) as is the ill-defined soft tissue about the more caudal aspect of the right hilum measuring approximately 1.6 cm (image 34, series 7). Grossly unchanged right upper lobe volume loss. There is unchanged complete collapse of the right middle lobe. Perihilar bronchiectasis and architectural distortion are unchanged compatible with sequela of prior radiation therapy.  There is minimal subsegmental atelectasis within the dependent portion of the left lower lobe. No new focal airspace opacities. No pleural effusion or pneumothorax. The central pulmonary airways are otherwise patent as described.  Unchanged punctate (approximately 7 mm) calcified granuloma within the right upper lobe (image 18, series 7) and partially calcified mediastinal and right hilar lymph nodes, the sequela of prior granulomatous infection.  No worsening mediastinal or hilar lymphadenopathy. No axillary lymphadenopathy.  Normal heart size. Coronary artery calcifications. Atherosclerotic plaque within a normal caliber thoracic aorta.  Conventional configuration of the aortic arch. The origin and proximal aspects of the branch vessels of the aortic arch (in particular, the left subclavian artery) contain a moderate amount of calcified plaque though remain widely patent. No thoracic aortic dissection or  periaortic stranding. Although this examination was not tailored for the evaluation of the pulmonary arteries, there are no discrete filling defects within the central pulmonary arterial tree to suggest central pulmonary embolism.  CT ABDOMEN AND PELVIS FINDINGS  Normal hepatic contour. There is progressive metastatic  disease within the liver. Dominant centrally necrotic mass within the caudal aspect of the lateral segment of the left lobe of the liver has increased in size, now measuring 4.6 x 3.8 cm (image 60, series 3), previously, 2.8 x 2.5 cm, it is now associated with a minimal amount of adjacent intrahepatic biliary duct dilatation (image 55, series 3). Interval increase in size of the ill-defined hypo attenuating mass with the central aspect of the right lobe of the liver, now measuring approximately 1.5 cm (image 46), previously, 1.2 cm. The ill-defined area of hyper enhancement within the dome of the right lobe of the liver (image 36, series 3) is unchanged though also remains worrisome for an additional area of metastatic disease. There is a minimal amount of focal fatty infiltration adjacent to the fissure for ligamentum teres. Normal appearance of the gallbladder. No ascites.  There is symmetric enhancement and excretion of the bilateral kidneys. No renal stones on this postcontrast examination. Left-sided hypoattenuating renal lesions too small to adequately characterize of favored to represent renal cysts. No urinary obstruction or perinephric stranding.  Unchanged mild thickening of the medial limb of the left adrenal gland (image 56, series 5, too small to adequately characterize. Normal appearance of the right adrenal gland and spleen. The pancreas remains atrophic.  Moderate colonic stool burden without evidence of obstruction. Enteric contrast extends to the level of the hepatic flexure of the colon. The bowel is normal in course and caliber without wall thickening or evidence of obstruction. The  appendix is not visualized, however there is no pericecal inflammatory change. No pneumoperitoneum, pneumatosis or portal venous gas.  Moderate amount of mixed calcified and noncalcified atherosclerotic plaque within a normal caliber abdominal aorta. The major branch vessels of the abdominal aorta appear patent on this non CTA examination. No retroperitoneal, mesenteric, pelvic or inguinal lymphadenopathy.  Post hysterectomy. No discrete adnexal lesions. No free fluid in the pelvic cul-de-sac.  Ill-defined area of linear sclerosis within the right sacral ale is unchanged (coronal images 55 and 56, series 4). Post intra medullary and dynamic screw fixation of the proximal femur, incompletely imaged. Severe degenerative change the right hip with joint space loss, articular surface irregularity and sclerosis. 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  IMPRESSION: Chest Impression  1. Stable post treatment change of the medial aspect of the right upper lobe and right hilum, similar to the 01/07/2014 examination. No evidence of progressive metastatic disease within the chest. 2. Coronary artery calcifications. Abdomen and Pelvis Impression:  1. Progressive metastatic disease within the liver with dominant lesion within the left lobe of the liver now measuring 4.6 cm in diameter, previously, 2.8 cm. 2. Post intra medullary and dynamic screw fixation of the right femur and femoral neck, incompletely imaged and evaluated - further evaluation could be performed with dedicated radiographs of the right hip and/or femur as clinically indicated. 3. Severe degenerative change of the right hip. 4. Unchanged ill-defined area of linear sclerosis within the right sacral ala with differential considerations again including osseous metastatic disease versus is a sacral insufficiency fracture.   Electronically Signed   By: Sandi Mariscal M.D.   On: 02/22/2014 18:20   Ct Abdomen Pelvis W Contrast  02/22/2014   CLINICAL DATA:   Right-sided weakness radiating to the right hip and leg for 1 week. History of lung, liver and hip cancer.  EXAM: CT CHEST, ABDOMEN, AND PELVIS WITH CONTRAST  TECHNIQUE: Multidetector CT imaging of the chest, abdomen and pelvis was performed following  the standard protocol during bolus administration of intravenous contrast.  CONTRAST:  67m OMNIPAQUE IOHEXOL 300 MG/ML SOLN, 8100mOMNIPAQUE IOHEXOL 300 MG/ML SOLN  COMPARISON:  CT the chest, abdomen pelvis - 01/07/2021  FINDINGS: CT CHEST FINDINGS  Ill-defined soft tissue about the medial aspect of the right upper lobe is grossly unchanged, measuring approximately 1.4 x 3.3 cm (image 27, series 7) as is the ill-defined soft tissue about the more caudal aspect of the right hilum measuring approximately 1.6 cm (image 34, series 7). Grossly unchanged right upper lobe volume loss. There is unchanged complete collapse of the right middle lobe. Perihilar bronchiectasis and architectural distortion are unchanged compatible with sequela of prior radiation therapy.  There is minimal subsegmental atelectasis within the dependent portion of the left lower lobe. No new focal airspace opacities. No pleural effusion or pneumothorax. The central pulmonary airways are otherwise patent as described.  Unchanged punctate (approximately 7 mm) calcified granuloma within the right upper lobe (image 18, series 7) and partially calcified mediastinal and right hilar lymph nodes, the sequela of prior granulomatous infection.  No worsening mediastinal or hilar lymphadenopathy. No axillary lymphadenopathy.  Normal heart size. Coronary artery calcifications. Atherosclerotic plaque within a normal caliber thoracic aorta.  Conventional configuration of the aortic arch. The origin and proximal aspects of the branch vessels of the aortic arch (in particular, the left subclavian artery) contain a moderate amount of calcified plaque though remain widely patent. No thoracic aortic dissection or  periaortic stranding. Although this examination was not tailored for the evaluation of the pulmonary arteries, there are no discrete filling defects within the central pulmonary arterial tree to suggest central pulmonary embolism.  CT ABDOMEN AND PELVIS FINDINGS  Normal hepatic contour. There is progressive metastatic disease within the liver. Dominant centrally necrotic mass within the caudal aspect of the lateral segment of the left lobe of the liver has increased in size, now measuring 4.6 x 3.8 cm (image 60, series 3), previously, 2.8 x 2.5 cm, it is now associated with a minimal amount of adjacent intrahepatic biliary duct dilatation (image 55, series 3). Interval increase in size of the ill-defined hypo attenuating mass with the central aspect of the right lobe of the liver, now measuring approximately 1.5 cm (image 46), previously, 1.2 cm. The ill-defined area of hyper enhancement within the dome of the right lobe of the liver (image 36, series 3) is unchanged though also remains worrisome for an additional area of metastatic disease. There is a minimal amount of focal fatty infiltration adjacent to the fissure for ligamentum teres. Normal appearance of the gallbladder. No ascites.  There is symmetric enhancement and excretion of the bilateral kidneys. No renal stones on this postcontrast examination. Left-sided hypoattenuating renal lesions too small to adequately characterize of favored to represent renal cysts. No urinary obstruction or perinephric stranding.  Unchanged mild thickening of the medial limb of the left adrenal gland (image 56, series 5, too small to adequately characterize. Normal appearance of the right adrenal gland and spleen. The pancreas remains atrophic.  Moderate colonic stool burden without evidence of obstruction. Enteric contrast extends to the level of the hepatic flexure of the colon. The bowel is normal in course and caliber without wall thickening or evidence of obstruction. The  appendix is not visualized, however there is no pericecal inflammatory change. No pneumoperitoneum, pneumatosis or portal venous gas.  Moderate amount of mixed calcified and noncalcified atherosclerotic plaque within a normal caliber abdominal aorta. The major branch vessels of the abdominal  aorta appear patent on this non CTA examination. No retroperitoneal, mesenteric, pelvic or inguinal lymphadenopathy.  Post hysterectomy. No discrete adnexal lesions. No free fluid in the pelvic cul-de-sac.  Ill-defined area of linear sclerosis within the right sacral ale is unchanged (coronal images 55 and 56, series 4). Post intra medullary and dynamic screw fixation of the proximal femur, incompletely imaged. Severe degenerative change the right hip with joint space loss, articular surface irregularity and sclerosis. 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  IMPRESSION: Chest Impression  1. Stable post treatment change of the medial aspect of the right upper lobe and right hilum, similar to the 01/07/2014 examination. No evidence of progressive metastatic disease within the chest. 2. Coronary artery calcifications. Abdomen and Pelvis Impression:  1. Progressive metastatic disease within the liver with dominant lesion within the left lobe of the liver now measuring 4.6 cm in diameter, previously, 2.8 cm. 2. Post intra medullary and dynamic screw fixation of the right femur and femoral neck, incompletely imaged and evaluated - further evaluation could be performed with dedicated radiographs of the right hip and/or femur as clinically indicated. 3. Severe degenerative change of the right hip. 4. Unchanged ill-defined area of linear sclerosis within the right sacral ala with differential considerations again including osseous metastatic disease versus is a sacral insufficiency fracture.   Electronically Signed   By: Sandi Mariscal M.D.   On: 02/22/2014 18:20    Positive ROS: All other systems have been reviewed and were  otherwise negative with the exception of those mentioned in the HPI and as above.  Labs cbc  Recent Labs  02/23/14 0415 02/24/14 0400  WBC 6.7 6.1  HGB 9.7* 8.9*  HCT 28.8* 26.7*  PLT 249 239    Labs inflam No results found for this basename: ESR, CRP,  in the last 72 hours  Labs coag  Recent Labs  02/23/14 0415  INR 1.16     Recent Labs  02/23/14 0415 02/24/14 0400  NA 136* 134*  K 3.2* 3.1*  CL 93* 95*  CO2 30 29  GLUCOSE 93 92  BUN 9 9  CREATININE 0.50 0.58  CALCIUM 9.6 9.2    Physical Exam: Filed Vitals:   02/24/14 0534  BP: 112/55  Pulse: 88  Temp: 98.1 F (36.7 C)  Resp: 18   General: Alert, no acute distress Cardiovascular: No pedal edema Respiratory: No cyanosis, no use of accessory musculature GI: No organomegaly, abdomen is soft and non-tender Skin: No lesions in the area of chief complaint other than those listed below in MS[facility] exam.  Neurologic: Sensation intact distally Psychiatric: Patient is competent for consent with normal mood and affect Lymphatic: No axillary or cervical lymphadenopathy  MUSCULOSKELETAL:  RLE: painless hip ROM, incisions benign. 2+DP, NO TA/EHL function, +GS. Compartments soft. Decreased sensation to global foot LLE: Foot drop with decreased sensation on this side as well.  Other extremities are atraumatic with painless ROM and NVI.  Assessment: Healed hip fracture, sacral lesion vs stable fracture, radicular symptoms and bilateral foot drop   Plan: Hip Fracture: This fracture is healed and broken interlock screw occurred prior to union. Nothing to do  Nerve compression/radicular symptoms/foot drop: This may be due to the chemo or nerve compression at her L spine from degenerative changes or a L-spine met.   -MRI +/- contrast to eval this  -if mass is present may benefit from radiation to decrease the radicular symptoms  -Will start dose pak and if MR shows nerve compression  from a disk or facet joint she may  benefit from    directed cortisone injection.  -Will consider Spine consult if significant pathology is found.    Weight Bearing Status: WBAT PT-ROM as tolerated, WBAT BLE Prafo boots    Edmonia Lynch, D, MD Cell [phone]   02/24/2014 1:07 PM

## 2014-02-24 NOTE — Progress Notes (Addendum)
INITIAL NUTRITION ASSESSMENT  DOCUMENTATION CODES Per approved criteria  -Severe malnutrition in the context of chronic illness  Pt meets criteria for severe MALNUTRITION in the context of chronic illness as evidenced by PO intake < 75% for > one month, 15% body weight loss in 6 months, severe muscle wasting and subcutaneous fat loss.   INTERVENTION: -Recommend Boost Plus BID, each supplement provides 360 kcal, and 14 gram -Recommend MagicCup BID, each supplement provides 290 kcal, and 9 gram protein -Encouraged pt to increase high protein/kcal foods/snacks -Encouraged pt trial use of [employer] Breakfast with meals -Will continue to monitor  NUTRITION DIAGNOSIS: Inadequate oral intake related to early satiety/weakness/decreased appetite as evidenced by PO intake < 75%, ongoing wt loss.   Goal: Pt to meet >/= 90% of their estimated nutrition needs    Monitor:  Total protein/energy intake, labs, weights, supplement tolerance  Reason for Assessment: Consult to Assess  63 y.o. female  Admitting Dx: Lower extremity weakness  ASSESSMENT: Stephanie Oliver is a 63 y.o. female with Past medical history of metastatic non-small cell lung cancer, depression, hypertension, osteoporosis,.  The patient is presenting with generalized weakness and right leg pain. She has history of metastatic lung cancer and has completed her course of chemotherapy with radiation and was under observation.  -Pt has been followed by outpatient oncology RD since 08/2013. Had recommended high kcal/protein foods/snacks, and 2-3 Ensure Complete daily. Was given case of Ensure Complete supplements to assist in compliance -Endorsed feelings of early satiety, decreased appetite, and weakness that has inhibited PO intake.  Diet recall indicates pt consuming oatmeal for breakfast, small bites of soft foods or sweets for lunch (mac and cheese, cheese cake, ice cream) and Jello for dinner. Will drink one Ensure Complete  every other day.  -Encouraged intake of high kcal/protein snacks. Pt agreeable to trial MagicCup supplement with meals, RD provided sample for pt. Caretaker also willing to incorporate [employer] with meals to supplement intake -Pt also prefers Boost Plus vs Ensure, will modify supplement -With improving appetite during admit, was able to consume 50% of pancakes at breakfast -Endorsed ongoing weight loss, pt's usual body weight around 130 lbs. Has lost 20 lbs in past 6 months (15% body weight, severe for time frame). Pt with also visible severe muscle wasting and subcutaneous fat loss  Height: Ht Readings from Last 1 Encounters:  02/22/14 5\' 3"  (1.6 m)    Weight: Wt Readings from Last 1 Encounters:  02/24/14 111 lb 11.2 oz (50.667 kg)    Ideal Body Weight: 115 lbs  % Ideal Body Weight: 97%  Wt Readings from Last 10 Encounters:  02/24/14 111 lb 11.2 oz (50.667 kg)  01/12/14 110 lb 6.4 oz (50.077 kg)  12/21/13 113 lb (51.256 kg)  11/29/13 118 lb 3.2 oz (53.615 kg)  11/09/13 119 lb 12.8 oz (54.341 kg)  10/19/13 123 lb 8 oz (56.019 kg)  10/07/13 124 lb 11.2 oz (56.564 kg)  09/28/13 124 lb 8 oz (56.473 kg)  09/27/13 125 lb (56.7 kg)  08/30/13 127 lb 8 oz (57.834 kg)    Usual Body Weight: 130 lbs  % Usual Body Weight: 85%  BMI:  Body mass index is 19.79 kg/(m^2).  Estimated Nutritional Needs: Kcal: 1700-1900 Protein: 75-85 gram Fluid: >/=1700 ml/daily  Skin: WDL  Diet Order: General  EDUCATION NEEDS: -Education needs addressed   Intake/Output Summary (Last 24 hours) at 02/24/14 1229 Last data filed at 02/24/14 0820  Gross per 24 hour  Intake  340 ml  Output   1000 ml  Net   -660 ml    Last BM: 9/09   Labs:   Recent Labs Lab 02/22/14 1440 02/23/14 0415 02/24/14 0400  NA 138 136* 134*  K 3.7 3.2* 3.1*  CL 92* 93* 95*  CO2 32 30 29  BUN 11 9 9   CREATININE 0.54 0.50 0.58  CALCIUM 9.9 9.6 9.2  MG  --   --  1.5  GLUCOSE 86 93 92     CBG (last 3)  No results found for this basename: GLUCAP,  in the last 72 hours  Scheduled Meds: . amiodarone  200 mg Oral Daily  . apixaban  5 mg Oral BID  . cholecalciferol  1,000 Units Oral Daily  . ciprofloxacin  250 mg Oral BID  . digoxin  0.125 mg Oral Daily  . diltiazem  180 mg Oral Daily  . diphenhydrAMINE  25 mg Oral QHS  . feeding supplement (ENSURE COMPLETE)  237 mL Oral BID BM  . folic acid  1 mg Oral Daily  . gabapentin  100 mg Oral TID  . iron polysaccharides  150 mg Oral Daily  . levothyroxine  25 mcg Oral QAC breakfast  . lidocaine  1 patch Transdermal Q24H  . magnesium sulfate 1 - 4 g bolus IVPB  2 g Intravenous Once  . pantoprazole  40 mg Oral Daily  . polyethylene glycol  17 g Oral Daily  . potassium chloride  10 mEq Intravenous Q1 Hr x 4  . potassium chloride SA  20 mEq Oral Daily  . thiamine  100 mg Oral Daily    Continuous Infusions: . 0.9 % NaCl with KCl 20 mEq / L 100 mL/hr at 02/24/14 0740    Past Medical History  Diagnosis Date  . Depression   . Panic attacks   . Hypertension   . Smoker   . Alcohol abuse   . History of radiation therapy 07/28/13-08/25/13    rt lung,37.5Gy/3fx  . Cancer     lung ca    Past Surgical History  Procedure Laterality Date  . Tonsillectomy    . Back surgery    . Femur im nail Right 06/22/2013    Procedure: RIGHT HIP GAMMA NAIL FIXATION   ;  Surgeon: Renette Butters, MD;  Location: [facility] [facility];  Service: Orthopedics;  Laterality: Right;  . Video bronchoscopy with endobronchial ultrasound N/A 06/22/2013    Procedure: VIDEO BRONCHOSCOPY WITH ENDOBRONCHIAL ULTRASOUND;  Surgeon: Doree Fudge, MD;  Location: [facility];  Service: Pulmonary;  Laterality: N/A;    Atlee Abide MS RD LDN Clinical Dietitian [phone]

## 2014-02-24 NOTE — Evaluation (Addendum)
Physical Therapy Evaluation Patient Details Name: Stephanie Oliver MRN: [id number] DOB: [birth year] Today's Date: 02/24/2014   History of Present Illness  63 yo female admitted with bil LE weakness, R hip/sacral area pain. Hx of NSCLC, diffuse osteopenia, mets to liver, lung, chemo induced neuropathy, ETOH abuse, R femur IM nail 06/22/13  Clinical Impression  On eval, pt required Mod-max assist +2 for mobility. Stood for ~10 seconds with RW before bil knees buckled. Performed lateral SB scoot bed>BSC then squat pivot BSC>bed. Pt reports R hip/sacral area pain with mobility. Feel pt will need continued rehab in CIR vs SNF setting before returning home.     Follow Up Recommendations CIR (SNF if CIR not an option)    Equipment Recommendations  Wheelchair cushion (measurements PT);Wheelchair (measurements PT)-if pt doesn't already have   Recommendations for Other Services OT consult;Rehab consult     Precautions / Restrictions Precautions Precautions: Fall Precaution Comments: high fall risk Restrictions Weight Bearing Restrictions: No-WBAT bil LE per ortho      Mobility  Bed Mobility Overal bed mobility: Needs Assistance Bed Mobility: Rolling;Sidelying to Sit;Sit to Sidelying Rolling: Mod assist Sidelying to sit: Mod assist;+2 for physical assistance;+2 for safety/equipment     Sit to sidelying: Mod assist;+2 for physical assistance;+2 for safety/equipment General bed mobility comments: assist for trunk and bil LEs. Utilized bedpad for positioning, scooting. Increased time.   Transfers Overall transfer level: Needs assistance Equipment used: Rolling walker (2 wheeled) Transfers: Sit to/from [employer];Lateral/Scoot Transfers Sit to Stand: Max assist;+2 physical assistance;+2 safety/equipment;From elevated surface   Squat pivot transfers: Mod assist;+2 physical assistance;+2 safety/equipment    Lateral/Scoot Transfers: Mod assist;+2 physical assistance;+2  safety/equipment;With slide board General transfer comment: assist to rise, stabilize, control descent. Pt unable to achieve full upright trunk posture due to pain . Bil LE buckling-pt has to lock out both knees in order keep them extended. Stood ~10 seconds with significant +2 assist before knees buckled. Assist for anterior weightshifting with blocking of both knees required during transfers.  Ambulation/Gait                Stairs            Wheelchair Mobility    Modified Rankin (Stroke Patients Only)       Balance Overall balance assessment: Needs assistance Sitting-balance support: Bilateral upper extremity supported;Feet supported Sitting balance-Leahy Scale: Fair Sitting balance - Comments: Sat EOB and on BSC with close Min guard assist once positioned.                                     Pertinent Vitals/Pain      Home Living                        Prior Function                 Hand Dominance        Extremity/Trunk Assessment   Upper Extremity Assessment: Defer to OT evaluation           Lower Extremity Assessment: RLE deficits/detail;LLE deficits/detail RLE Deficits / Details: hip flex 2/5, knee ext 3/5, knee flex 2/5, DF/PF 0/5. Can range ankle to neutral. Encouraged pt to wear PRAFO LLE Deficits / Details: hip flex 3+/5, knee ext 3+/5, knee flex 3/5, DF/PF 0/5. Can range ankle to neutral. Encouraged pt to wear PRAFO  Cervical / Trunk  Assessment: Kyphotic  Communication      Cognition Arousal/Alertness: Awake/alert Behavior During Therapy: WFL for tasks assessed/performed Overall Cognitive Status: Within Functional Limits for tasks assessed                      General Comments      Exercises        Assessment/Plan    PT Assessment Patient needs continued PT services  PT Diagnosis Generalized weakness;Acute pain;Difficulty walking   PT Problem List Decreased strength;Decreased activity  tolerance;Decreased balance;Decreased mobility;Decreased knowledge of use of DME;Decreased coordination;Impaired sensation;Impaired tone;Decreased skin integrity;Pain  PT Treatment Interventions DME instruction;Functional mobility training;Therapeutic activities;Patient/family education;Balance training;Therapeutic exercise;Neuromuscular re-education;Wheelchair mobility training   PT Goals (Current goals can be found in the Care Plan section) Acute Rehab PT Goals Patient Stated Goal: to get stronger PT Goal Formulation: With patient Time For Goal Achievement: 03/10/14 Potential to Achieve Goals: Fair    Frequency Min 3X/week   Barriers to discharge Decreased caregiver support      Co-evaluation               End of Session Equipment Utilized During Treatment: Gait belt Activity Tolerance: Patient limited by fatigue;Patient limited by pain Patient left: in bed;with call bell/phone within reach;with family/visitor present           Time: 7121-9758 PT Time Calculation (min): 30 min   Charges:   PT Evaluation $Initial PT Evaluation Tier I: 1 Procedure PT Treatments $Therapeutic Activity: 8-22 mins   PT G Codes:          Weston Anna, MPT Pager: [phone]

## 2014-02-24 NOTE — Progress Notes (Signed)
Patient BU:LAGTX [place]      DOB: 1951/04/17      MIW:[id number]   Palliative Medicine Team at [facility] Progress Note    Subjective: Treacy having existential crisis. Crying and anxious about all the information.  She states "I just don't want to die, dammit".  She does understand her prognosis but she is dealing with all the emotions that go with grappling with ones death.  She is insistent that she need to go home to her dog and grandson . She is angry that her daughter won't come and engage with her. She is very tearful.  She reports that she is afraid that she won't be able to tolerate the MRI.      Filed Vitals:   02/24/14 1542  BP: 111/59  Pulse: 90  Temp: 98.1 F (36.7 C)  Resp: 18   Physical exam: Generally: tearful PERRL, EOMI, alopecia Chest decreased, no rhonchi although she is coughing up thick mucous CVS: regular, S1, S2 ABd: soft not tender or distended Ext contracted right leg Neuro: spasticity right leg, weakness bilateral legs Assessment and plan: 63 yr old female with metastatic lung cancer involving liver and sacrum.  Dr. Earlie Server has other lines of chemo to off if patient chooses. Patient frustrated and tired just wants to go home and be with family but won't commit to no further chemo or hospice at this time.  1.  Full code.  We have talked some about advanced care planning but she is in "fight" mode  2.  Lower extremity weakness: MRI pending.  Agree with steroids to try to help pain  3.  Lower back and hip pain: neuropathic and nociceptive pain mixed.  Continue lidocain, neuorontin, oxycodone.  4.  Debility and social issues: patient unwilling to accept that she can't be cared for at home. Sister In [place] and brother could come down in the next few weeks to bring her home but we would need to have a care plan until then.  Sister in law has offered conference call meeting with Yuri.  [place], [place] did not want to do that.  Will ask my team to offer again  tomorrow.   Total time : 400- 500 pm  Neri Vieyra L. Lovena Le, MD MBA The Palliative Medicine Team at [facility] Phone: [phone] Pager: [phone] ( Use team phone after hours)

## 2014-02-25 ENCOUNTER — Ambulatory Visit: Attending: Radiation Oncology | Admitting: Radiation Oncology

## 2014-02-25 DIAGNOSIS — C3490 Malignant neoplasm of unspecified part of unspecified bronchus or lung: Secondary | ICD-10-CM

## 2014-02-25 LAB — URINE CULTURE: Colony Count: 50000

## 2014-02-25 MED ORDER — KETOROLAC TROMETHAMINE 30 MG/ML IJ SOLN
30.0000 mg | Freq: Once | INTRAMUSCULAR | Status: AC
  Filled 2014-02-25: qty 1

## 2014-02-25 MED ORDER — DEXAMETHASONE 2 MG PO TABS
2.0000 mg | ORAL_TABLET | Freq: Two times a day (BID) | ORAL | Status: DC
  Administered 2014-02-25 – 2014-03-01 (×9): 2 mg via ORAL
  Filled 2014-02-25 (×10): qty 1

## 2014-02-25 MED ORDER — KETOROLAC TROMETHAMINE 15 MG/ML IJ SOLN
15.0000 mg | Freq: Three times a day (TID) | INTRAMUSCULAR | Status: DC
  Administered 2014-02-25 – 2014-03-01 (×11): 15 mg via INTRAVENOUS
  Filled 2014-02-25 (×14): qty 1

## 2014-02-25 MED ORDER — OXYCODONE HCL 5 MG PO TABS
5.0000 mg | ORAL_TABLET | ORAL | Status: DC | PRN
  Administered 2014-02-28: 5 mg via ORAL
  Filled 2014-02-25: qty 1

## 2014-02-25 MED ORDER — SODIUM CHLORIDE 0.9 % IV SOLN
90.0000 mg | Freq: Once | INTRAVENOUS | Status: AC
  Administered 2014-02-25: 90 mg via INTRAVENOUS
  Filled 2014-02-25: qty 10

## 2014-02-25 MED ORDER — PREGABALIN 25 MG PO CAPS
25.0000 mg | ORAL_CAPSULE | Freq: Every day | ORAL | Status: DC
  Administered 2014-02-25 – 2014-02-28 (×4): 25 mg via ORAL
  Filled 2014-02-25 (×4): qty 1

## 2014-02-25 NOTE — Progress Notes (Signed)
Progress Note   Stephanie Oliver VZD:[id number] DOB: [birth year] DOA: 02/22/2014 PCP: [name], MD   Brief Narrative:   Stephanie Oliver is an 63 y.o. female with a PMH of metastatic non-small cell lung cancer status post palliative chemotherapy and radiation therapy, depression, hypertension and osteoporosis was referred to the hospital by her oncologist for evaluation of progressive weakness. The patient clearly cannot adequately care for herself and does not have consistent 24-hour supervision in the home. She cannot bear weight secondary significant lower extremity weakness. Upon initial evaluation in the ED, the patient was noted to have significant lower extremity weakness and a CT scan of her abdomen and pelvis revealed possible sacral insufficiency fracture versus metastatic involvement of the sacrum. Urinalysis was negative for signs of infection.   Assessment/Plan:   Principal Problem:   Lower extremity weakness, multifactorial with CT findings of sacral insufficiency fracture versus metastasis to the sacrum / fracture of screw in distal femur / severe osteoarthritis of the right hip / peripheral neuropathy all contributory  Seen by orthopedics 02/24/14, has a intramedullary rod screw fracture suggestive of hardware loosening of the right femur, which is where she is having her pain.  Fracture felt to be healed with broken interlock screw occurring prior to union, no further intervention needed per orthopedic M.D.  CT head negative for brain metastasis.  MRI of the lumbosacral spine consistent with sacral metastasis and severe spinal stenosis secondary to disc degeneration throughout the lumbar spine. Started on prednisone dose pack. We'll consult radiation oncologist for consideration of targeted radiotherapy to sacral metastasis.  Known peripheral neuropathy with foot drop from history of treatment with Taxol likely contributory.Continue gabapentin.  PT/OT evaluations  requested.  Palliative care meds with patient 02/23/14 to discuss goals of care are given poor prognosis secondary to underlying metastatic lung cancer. Continues to want full CODE STATUS and further treatment of her cancer if indicated.  Active Problems:   Family dynamics problem  The patient has a history of "borderline personality "according to sister-in-law who lives in [place]. She has 2 brothers that live there.  She lives with her daughter who also has a personality disorder and who, according to family members in [place] and her paid caregivers, do not provide her with any care in the home. The family in [place] he is willing for her to come and live with them, however the patient prefers to remain in her home so that she can have access to her grandchild.  The patient tells me that her daughter "is mad at her "because she is" denial "about her cancer and is going to die. The patient tells me she promised her daughter/grandson that she would take care of them and feels that the longer she lives, the more she can care for their needs.  At this time, the patient is unsafe to return to her home situation as the family in [place] have discontinued service of her her paid caregivers.  The patient is in a fair amount of denial regarding her diagnosis and prognosis, and gives caregivers and family diferring updates regarding what the doctors have told her.  Palliative care assisting with disposition issues.    Hypokalemia  Potassium WNL today after supplementation.    UTI  Many bacteria and leukocytes noted on urinalysis. Urine culture on grew 50,000 colonies of Escherichia coli. Status post empiric Cipro for 3 days of treatment. Doubt this explains her severe weakness.    Presyncope / hypotension / history of hypertension  Patient was presyncopal prior to being returned to the bed after being mobilized to the bedside commode.  Fecal occult blood negative.  Hypotension thought to  be orthostatic, given 500 cc bolus of normal saline no further episodes.  Hold antihypertensives with parameters. Lasix D/C'd 02/23/14. BP improved.    Intertrochanteric fracture of right hip  Status post intra medullary and dynamic screw fixation of the right femur and femoral neck 06/22/13.  Radiographs show fracture of the screw with lucency around the screw suggesting hardware loosening.  Orthopedics evaluation done 02/24/14, fracture is healed. PT evaluation.    Severe protein calorie malnutrition   Patient appears cachectic.   Pt meets criteria for severe MALNUTRITION in the context of chronic illness as evidenced by PO intake < 75% for > one month, 15% body weight loss in 6 months, severe muscle wasting and subcutaneous fat loss.  Continue supplements as recommended.    Osteoporosis  Consider outpatient treatment with a bisphosphonate.    Bipolar 1 disorder  Not currently on any mood stabilizing medications. Continue Xanax as needed.    PAF (paroxysmal atrial fibrillation)  On chronic Eliquis.    Continue amiodarone, digoxin and Cardizem as blood pressure tolerates.      Non-small cell cancer of right lung with mediastinal lymphadenopathy  Status post palliative chemotherapy/radiation therapy with plans to repeat CT scans in 2 months for consideration of second line treatment.  Stable disease noted in chest, but progression of disease noted in liver. LFTs stable.  Will discuss treatment plan with Dr. Julien Nordmann.    Foot drop / Peripheral neuropathy  Secondary history of therapy with Taxol. Continue Neurontin.    Hypothyroidism  Continue Synthroid. TSH elevated at 10.460. Free T4 WNL, would not adjust Synthroid further. Recheck TSH in 4 weeks.    Normocytic anemia  Continue iron supplement and folate.  Serum B12/folate WNL. Stools Hemoccult negative.    DVT Prophylaxis  On therapeutic anticoagulation.  Code Status: Full. Family Communication: Fermin Schwab [phone]. Disposition Plan: Home when stable.   IV Access:    Peripheral IV   Procedures and diagnostic studies:   Dg Pelvis 1-2 Views 02/22/2014: 1. Diffuse severe osteopenia and degenerative changes lumbar spine and both hips. 2. Open reduction internal fixation right hip fracture. This appears stable. 3. No acute abnormality.    Dg Femur Right 02/22/2014: 1. The distal transfixing femoral intra medullary rod screw is fractured suggestive of hardware loosening. 2. Severe degenerative change of the right hip.      Ct Chest W Contrast 02/22/2014: 1. Stable post treatment change of the medial aspect of the right upper lobe and right hilum, similar to the 01/07/2014 examination. No evidence of progressive metastatic disease within the chest. 2. Coronary artery calcifications.  Ct Abdomen Pelvis W Contrast 02/22/2014:  1. Progressive metastatic disease within the liver with dominant lesion within the left lobe of the liver now measuring 4.6 cm in diameter, previously, 2.8 cm. 2. Post intra medullary and dynamic screw fixation of the right femur and femoral neck, incompletely imaged and evaluated - further evaluation could be performed with dedicated radiographs of the right hip and/or femur as clinically indicated. 3. Severe degenerative change of the right hip. 4. Unchanged ill-defined area of linear sclerosis within the right sacral ala with differential considerations again including osseous metastatic disease versus is a sacral insufficiency fracture.     Ct Head W Wo Contrast 02/23/2014: Atrophy and chronic microvascular ischemia.  Negative for metastatic disease.  Mr Lumbar Spine W Wo Contrast 02/24/2014: 1. Abnormal appearance of the right sacrum consistent with metastasis. The lesion extends into the right S1-2 neural foramen, affecting the right S1 nerve. There is likely an associated pathologic fracture. 2. Severe, diffuse disc degeneration throughout the lumbar spine as above,  resulting in severe spinal stenosis at L2-3 and L4-5 and severe bilateral neural foraminal stenosis at L5-S1.    Medical Consultants:    Dr. Edmonia Lynch, Orthopedics  Dr. Billey Chang, Palliative care   Other Consultants:    Physical therapy  Occupational therapy  Dietitian   Anti-Infectives:    Cipro 02/22/14---> 02/25/14.  Subjective:   Stephanie Oliver continues to complain of right leg pain, and also complains of headache today. Appetite fair. No nausea or vomiting.  Objective:    Filed Vitals:   02/24/14 0534 02/24/14 1542 02/24/14 2150 02/25/14 0440  BP: 112/55 111/59 121/67 138/77  Pulse: 88 90 86 90  Temp: 98.1 F (36.7 C) 98.1 F (36.7 C) 98.3 F (36.8 C) 97.9 F (36.6 C)  TempSrc: Oral Oral Oral Oral  Resp: 18 18 18 16   Height:      Weight: 50.667 kg (111 lb 11.2 oz)   51.438 kg (113 lb 6.4 oz)  SpO2: 98% 98% 95% 96%    Intake/Output Summary (Last 24 hours) at 02/25/14 0806 Last data filed at 02/24/14 2200  Gross per 24 hour  Intake 3016.33 ml  Output    400 ml  Net 2616.33 ml    Exam: Gen:  NAD, cachectic-appearing Cardiovascular:  RRR, No M/R/G Respiratory:  Lungs CTAB Gastrointestinal:  Abdomen soft, NT/ND, + BS Extremities:  No C/E/C   Data Reviewed:    Labs: Basic Metabolic Panel:  Recent Labs Lab 02/22/14 1440 02/23/14 0415 02/24/14 0400 02/24/14 2202  NA 138 136* 134* 138  K 3.7 3.2* 3.1* 4.7  CL 92* 93* 95* 98  CO2 32 30 29 25   GLUCOSE 86 93 92 160*  BUN 11 9 9 7   CREATININE 0.54 0.50 0.58 0.49*  CALCIUM 9.9 9.6 9.2 9.9  MG  --   --  1.5  --    GFR Estimated Creatinine Clearance: 57.6 ml/min (by C-G formula based on Cr of 0.49). Liver Function Tests:  Recent Labs Lab 02/22/14 1531 02/23/14 0415  AST 25 19  ALT 15 13  ALKPHOS 117 104  BILITOT 0.4 0.4  PROT 7.4 6.6  ALBUMIN 3.3* 2.8*   Coagulation profile  Recent Labs Lab 02/23/14 0415  INR 1.16    CBC:  Recent Labs Lab 02/22/14 1440  02/23/14 0415 02/24/14 0400  WBC 9.5 6.7 6.1  HGB 10.3* 9.7* 8.9*  HCT 31.5* 28.8* 26.7*  MCV 93.5 91.1 91.4  PLT 277 249 239   BNP (last 3 results)  Recent Labs  06/23/13 0830 06/24/13 0309  PROBNP 680.4* 995.0*   Thyroid function studies:  Recent Labs  02/22/14 1531  TSH 10.460*   Anemia work up:  Recent Labs  02/23/14 0745  VITAMINB12 473  FOLATE >20.0   Microbiology Recent Results (from the past 240 hour(s))  URINE CULTURE     Status: None   Collection Time    02/22/14  5:16 PM      Result Value Ref Range Status   Specimen Description URINE, CATHETERIZED   Final   Special Requests NONE   Final   Culture  Setup Time     Final   Value: 02/23/2014 04:49     Performed at  [facility] Count     Final   Value: 50,000 COLONIES/ML     Performed at [facility]     Final   Value: ESCHERICHIA COLI     Performed at [facility]   Report Status 02/25/2014 FINAL   Final   Organism ID, Bacteria ESCHERICHIA COLI   Final     Medications:   . amiodarone  200 mg Oral Daily  . apixaban  5 mg Oral BID  . cholecalciferol  1,000 Units Oral Daily  . ciprofloxacin  250 mg Oral BID  . digoxin  0.125 mg Oral Daily  . diltiazem  180 mg Oral Daily  . diphenhydrAMINE  25 mg Oral QHS  . folic acid  1 mg Oral Daily  . gabapentin  100 mg Oral TID  . iron polysaccharides  150 mg Oral Daily  . lactose free nutrition  237 mL Oral BID BM  . levothyroxine  25 mcg Oral QAC breakfast  . lidocaine  1 patch Transdermal Q24H  . pantoprazole  40 mg Oral Daily  . polyethylene glycol  17 g Oral Daily  . potassium chloride SA  20 mEq Oral Daily  . predniSONE  10 mg Oral Nightly  . predniSONE  5 mg Oral 3 x daily with food  . [START ON 02/26/2014] predniSONE  5 mg Oral 4X daily taper  . thiamine  100 mg Oral Daily   Continuous Infusions: . 0.9 % NaCl with KCl 20 mEq / L 100 mL/hr at 02/25/14 0517    Time spent: 25 minutes.    LOS: 3  days   [facility] Pager [phone]. If unable to reach me by pager, please call my cell phone at [phone].  *Please refer to [email or web address], password [email or web address] to get updated schedule on who will round on this patient, as hospitalists switch teams weekly. If 7PM-7AM, please contact night-coverage at [email or web address], password [email or web address] for any overnight needs.  02/25/2014, 8:06 AM

## 2014-02-25 NOTE — Consult Note (Signed)
Radiation Oncology         [phone] ________________________________  Name: Meesha Sek MRN: [id number]  Date: 02/22/2014  DOB: [birth year]   DIAGNOSIS:  Metastatic lung cancer  HISTORY OF PRESENT ILLNESS::Stephanie Oliver is a 63 y.o. female who is seen for an initial consultation visit. The patient has a history of metastatic non-small cell lung cancer. She has received palliative thoracic radiation treatment in our clinic and she completed this proximally 6 months ago. She also has received palliative chemotherapy through Dr. Julien Nordmann. The patient was admitted for some aggressive weakness and pain in the lower back/right hip region. She complains of some radiation of pain to the right lower extremity also. The patient has been evaluated and has been found to have findings suggestive of some hardware loosening of a intramedullary ride in the right femur. CT scan of the head was negative for brain metastasis. 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 Some on the sacral nerve at this level appears present. 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  The patient has discussed possible treatment options in terms of further chemotherapy with Dr. Julien Nordmann. She also has seen palliative medicine. At this time the patient's prognosis is poor but she does wish to remain aggressive with her treatment at this time. I have been asked to see her for consideration of possible palliative radiation treatment to the sacrum.   PAST MEDICAL HISTORY:  has a past medical history of Depression; Panic attacks; Hypertension; Smoker; Alcohol abuse; History of radiation therapy (07/28/13-08/25/13); and Cancer.     PAST SURGICAL HISTORY: Past Surgical History  Procedure Laterality Date  . Tonsillectomy    . Back surgery    . Femur im nail Right 06/22/2013    Procedure: RIGHT HIP GAMMA NAIL FIXATION   ;  Surgeon: Renette Butters, MD;  Location: [facility];  Service: Orthopedics;  Laterality: Right;  . Video bronchoscopy with endobronchial ultrasound N/A 06/22/2013    Procedure: VIDEO BRONCHOSCOPY WITH ENDOBRONCHIAL ULTRASOUND;  Surgeon: Doree Fudge, MD;  Location: [facility];  Service: Pulmonary;  Laterality: N/A;     FAMILY HISTORY: family history includes Arthritis in her mother; CAD in her father.   SOCIAL HISTORY:  reports that she quit smoking about 8 months ago. Her smoking use included Cigarettes. She smoked 1.50 packs per day. She has never used smokeless tobacco. She reports that she drinks about 2.4 ounces of alcohol per week. She reports that she does not use illicit drugs.   ALLERGIES: Paxil; Lisinopril; and Penicillins   MEDICATIONS:  Current Facility-Administered Medications  Medication Dose Route Frequency Provider Last Rate Last Dose  . 0.9 % NaCl with KCl 20 mEq/ L  infusion   Intravenous Continuous Venetia Maxon Rama, MD 100 mL/hr at 02/25/14 0517    . acetaminophen (TYLENOL) tablet 650 mg  650 mg Oral Q6H PRN Berle Mull, MD       Or  . acetaminophen (TYLENOL) suppository 650 mg  650 mg Rectal Q6H PRN Berle Mull, MD      . albuterol (PROVENTIL) (2.5 MG/3ML) 0.083% nebulizer solution 2.5 mg  2.5 mg Nebulization BID PRN Berle Mull, MD   2.5 mg at 02/24/14 1542  . ALPRAZolam Duanne Moron) tablet 0.5 mg  0.5 mg Oral BID PRN Berle Mull, MD   0.5 mg at 02/25/14 0104  . amiodarone (PACERONE) tablet 200 mg  200 mg Oral Daily Pranav  Posey Pronto, MD   200 mg at 02/25/14 1100  . apixaban (ELIQUIS) tablet 5 mg  5 mg Oral BID Berle Mull, MD   5 mg at 02/25/14 1100  . cholecalciferol (VITAMIN D) tablet 1,000 Units  1,000 Units Oral Daily Berle Mull, MD   1,000 Units at 02/25/14 1100  . dexamethasone (DECADRON) tablet 2 mg  2 mg Oral Q12H Acquanetta Chain, DO   2 mg at 02/25/14 1436  . digoxin (LANOXIN) tablet 0.125 mg  0.125 mg Oral Daily Berle Mull, MD   0.125 mg at 02/25/14 1100  . diltiazem  (CARDIZEM CD) 24 hr capsule 180 mg  180 mg Oral Daily Berle Mull, MD   180 mg at 02/25/14 1100  . diphenhydrAMINE (BENADRYL) capsule 25 mg  25 mg Oral QHS Berle Mull, MD   25 mg at 02/24/14 2302  . folic acid (FOLVITE) tablet 1 mg  1 mg Oral Daily Berle Mull, MD   1 mg at 02/25/14 1100  . HYDROcodone-acetaminophen (NORCO/VICODIN) 5-325 MG per tablet 1-2 tablet  1-2 tablet Oral Q4H PRN Berle Mull, MD      . iron polysaccharides (NIFEREX) capsule 150 mg  150 mg Oral Daily Berle Mull, MD   150 mg at 02/25/14 1100  . ketorolac (TORADOL) 15 MG/ML injection 15 mg  15 mg Intravenous 3 times per day Acquanetta Chain, DO      . lactose free nutrition (BOOST PLUS) liquid 237 mL  237 mL Oral BID BM Hazle Coca, RD   237 mL at 02/25/14 1400  . levothyroxine (SYNTHROID, LEVOTHROID) tablet 25 mcg  25 mcg Oral QAC breakfast Berle Mull, MD   25 mcg at 02/25/14 0759  . lidocaine (LIDODERM) 5 % 1 patch  1 patch Transdermal Q24H Marcelle Smiling, MD   1 patch at 02/24/14 1834  . ondansetron (ZOFRAN) tablet 4 mg  4 mg Oral Q6H PRN Berle Mull, MD       Or  . ondansetron (ZOFRAN) injection 4 mg  4 mg Intravenous Q6H PRN Berle Mull, MD      . oxyCODONE (Oxy IR/ROXICODONE) immediate release tablet 5 mg  5 mg Oral Q3H PRN Acquanetta Chain, DO      . pantoprazole (PROTONIX) EC tablet 40 mg  40 mg Oral Daily Berle Mull, MD   40 mg at 02/25/14 1100  . polyethylene glycol (MIRALAX / GLYCOLAX) packet 17 g  17 g Oral Daily Berle Mull, MD   17 g at 02/25/14 1100  . potassium chloride SA (K-DUR,KLOR-CON) CR tablet 20 mEq  20 mEq Oral Daily Berle Mull, MD   20 mEq at 02/25/14 1100  . pregabalin (LYRICA) capsule 25 mg  25 mg Oral QHS Acquanetta Chain, DO      . thiamine (VITAMIN B-1) tablet 100 mg  100 mg Oral Daily Berle Mull, MD   100 mg at 02/25/14 1100       PHYSICAL EXAM:  height is 5\' 3"  (1.6 m) and weight is 113 lb 6.4 oz (51.438 kg). Her oral temperature is 98.2 F (36.8 C). Her  blood pressure is 112/72 and her pulse is 86. Her respiration is 18 and oxygen saturation is 97%.   ECOG = 3  0 - Asymptomatic (Fully active, able to carry on all predisease activities without restriction)  1 - Symptomatic but completely ambulatory (Restricted in physically strenuous activity but ambulatory and able to carry out work of a light or sedentary nature. For example, light  housework, office work)  2 - Symptomatic, <50% in bed during the day (Ambulatory and capable of all self care but unable to carry out any work activities. Up and about more than 50% of waking hours)  3 - Symptomatic, >50% in bed, but not bedbound (Capable of only limited self-care, confined to bed or chair 50% or more of waking hours)  4 - Bedbound (Completely disabled. Cannot carry on any self-care. Totally confined to bed or chair)  5 - Death   Eustace Pen MM, Creech RH, Tormey DC, et al. [phone]). "Toxicity and response criteria of the [facility]". [place] Oncol. 5 (6): 649-55  General: Well-developed, in no acute distress HEENT: Normocephalic, atraumatic Cardiovascular: Regular rate and rhythm Respiratory: Clear to auscultation bilaterally GI: Soft, nontender, normal bowel sounds Extremities: No edema present    LABORATORY DATA:  Lab Results  Component Value Date   WBC 6.1 02/24/2014   HGB 8.9* 02/24/2014   HCT 26.7* 02/24/2014   MCV 91.4 02/24/2014   PLT 239 02/24/2014   Lab Results  Component Value Date   NA 138 02/24/2014   K 4.7 02/24/2014   CL 98 02/24/2014   CO2 25 02/24/2014   Lab Results  Component Value Date   ALT 13 02/23/2014   AST 19 02/23/2014   ALKPHOS 104 02/23/2014   BILITOT 0.4 02/23/2014      RADIOGRAPHY: Dg Pelvis 1-2 Views  02/22/2014   CLINICAL DATA:  Right foot drop.  Pain.  EXAM: PELVIS - 1-2 VIEW  COMPARISON:  CT 02/22/2014 and 01/08/1999 50.  FINDINGS: Prominent degenerative changes noted lumbar spine and both hips. Diffuse osteopenia. Patient has had  prior open reduction internal fixation of right hip fracture, this appears stable. No acute bony abnormality . Contrast noted in the colon and the bladder from prior CT.  IMPRESSION: 1. Diffuse severe osteopenia and degenerative changes lumbar spine and both hips. 2. Open reduction internal fixation right hip fracture. This appears stable. 3. No acute abnormality.   Electronically Signed   By: Marcello Moores  Register   On: 02/22/2014 21:27   Dg Femur Right  02/22/2014   CLINICAL DATA:  Post right hip arthroplasty (06/2013) now with right foot drop and pain involving the right groin.  EXAM: RIGHT FEMUR - 2 VIEW  COMPARISON:  AP pelvis radiographs - earlier same day; CT the chest, abdomen and pelvis - earlier same day; right hip radiographs - 06/21/2013; right knee radiographs - 07/13/2013  FINDINGS: Osteopenia. Post intra medullary fixation of the femur and dynamic screw fixation of the right femoral neck.  There is persistent lucency about the comminuted intertrochanteric femur fracture site without displacement. No definite acute fracture or dislocation.  Severe degenerative change the right hip with joint space loss, subchondral sclerosis and osteophytosis.  The cancellous screw within the distal diaphysis of the femur is fractured.  IMPRESSION: 1. The distal transfixing femoral intra medullary rod screw is fractured suggestive of hardware loosening. 2. Severe degenerative change of the right hip.   Electronically Signed   By: Sandi Mariscal M.D.   On: 02/22/2014 21:32   Ct Head W Wo Contrast  02/23/2014   CLINICAL DATA:  Lung cancer.  Rule out metastatic disease  EXAM: CT HEAD WITHOUT AND WITH CONTRAST  TECHNIQUE: Contiguous axial images were obtained from the base of the skull through the vertex without and with intravenous contrast  CONTRAST:  145mL OMNIPAQUE IOHEXOL 300 MG/ML  SOLN  COMPARISON:  CT head 06/23/2013  FINDINGS: Mild atrophy. Moderate chronic microvascular ischemic change in the white matter, similar to  the prior study.  Negative for acute infarct. Negative for hemorrhage or mass. No enhancing lesions.  No skull lesions.  IMPRESSION: Atrophy and chronic microvascular ischemia.  Negative for metastatic disease.   Electronically Signed   By: Franchot Gallo M.D.   On: 02/23/2014 16:23   Ct Chest W Contrast  02/22/2014   CLINICAL DATA:  Right-sided weakness radiating to the right hip and leg for 1 week. History of lung, liver and hip cancer.  EXAM: CT CHEST, ABDOMEN, AND PELVIS WITH CONTRAST  TECHNIQUE: Multidetector CT imaging of the chest, abdomen and pelvis was performed following the standard protocol during bolus administration of intravenous contrast.  CONTRAST:  69mL OMNIPAQUE IOHEXOL 300 MG/ML SOLN, 20mL OMNIPAQUE IOHEXOL 300 MG/ML SOLN  COMPARISON:  CT the chest, abdomen pelvis - 01/07/2021  FINDINGS: CT CHEST FINDINGS  Ill-defined soft tissue about the medial aspect of the right upper lobe is grossly unchanged, measuring approximately 1.4 x 3.3 cm (image 27, series 7) as is the ill-defined soft tissue about the more caudal aspect of the right hilum measuring approximately 1.6 cm (image 34, series 7). Grossly unchanged right upper lobe volume loss. There is unchanged complete collapse of the right middle lobe. Perihilar bronchiectasis and architectural distortion are unchanged compatible with sequela of prior radiation therapy.  There is minimal subsegmental atelectasis within the dependent portion of the left lower lobe. No new focal airspace opacities. No pleural effusion or pneumothorax. The central pulmonary airways are otherwise patent as described.  Unchanged punctate (approximately 7 mm) calcified granuloma within the right upper lobe (image 18, series 7) and partially calcified mediastinal and right hilar lymph nodes, the sequela of prior granulomatous infection.  No worsening mediastinal or hilar lymphadenopathy. No axillary lymphadenopathy.  Normal heart size. Coronary artery calcifications.  Atherosclerotic plaque within a normal caliber thoracic aorta.  Conventional configuration of the aortic arch. The origin and proximal aspects of the branch vessels of the aortic arch (in particular, the left subclavian artery) contain a moderate amount of calcified plaque though remain widely patent. No thoracic aortic dissection or periaortic stranding. Although this examination was not tailored for the evaluation of the pulmonary arteries, there are no discrete filling defects within the central pulmonary arterial tree to suggest central pulmonary embolism.  CT ABDOMEN AND PELVIS FINDINGS  Normal hepatic contour. There is progressive metastatic disease within the liver. Dominant centrally necrotic mass within the caudal aspect of the lateral segment of the left lobe of the liver has increased in size, now measuring 4.6 x 3.8 cm (image 60, series 3), previously, 2.8 x 2.5 cm, it is now associated with a minimal amount of adjacent intrahepatic biliary duct dilatation (image 55, series 3). Interval increase in size of the ill-defined hypo attenuating mass with the central aspect of the right lobe of the liver, now measuring approximately 1.5 cm (image 46), previously, 1.2 cm. The ill-defined area of hyper enhancement within the dome of the right lobe of the liver (image 36, series 3) is unchanged though also remains worrisome for an additional area of metastatic disease. There is a minimal amount of focal fatty infiltration adjacent to the fissure for ligamentum teres. Normal appearance of the gallbladder. No ascites.  There is symmetric enhancement and excretion of the bilateral kidneys. No renal stones on this postcontrast examination. Left-sided hypoattenuating renal lesions too small to adequately characterize of favored to represent renal cysts. No urinary obstruction  or perinephric stranding.  Unchanged mild thickening of the medial limb of the left adrenal gland (image 56, series 5, too small to adequately  characterize. Normal appearance of the right adrenal gland and spleen. The pancreas remains atrophic.  Moderate colonic stool burden without evidence of obstruction. Enteric contrast extends to the level of the hepatic flexure of the colon. The bowel is normal in course and caliber without wall thickening or evidence of obstruction. The appendix is not visualized, however there is no pericecal inflammatory change. No pneumoperitoneum, pneumatosis or portal venous gas.  Moderate amount of mixed calcified and noncalcified atherosclerotic plaque within a normal caliber abdominal aorta. The major branch vessels of the abdominal aorta appear patent on this non CTA examination. No retroperitoneal, mesenteric, pelvic or inguinal lymphadenopathy.  Post hysterectomy. No discrete adnexal lesions. No free fluid in the pelvic cul-de-sac.  Ill-defined area of linear sclerosis within the right sacral ale is unchanged (coronal images 55 and 56, series 4). Post intra medullary and dynamic screw fixation of the proximal femur, incompletely imaged. Severe degenerative change the right hip with joint space loss, articular surface irregularity and sclerosis. 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  IMPRESSION: Chest Impression  1. Stable post treatment change of the medial aspect of the right upper lobe and right hilum, similar to the 01/07/2014 examination. No evidence of progressive metastatic disease within the chest. 2. Coronary artery calcifications. Abdomen and Pelvis Impression:  1. Progressive metastatic disease within the liver with dominant lesion within the left lobe of the liver now measuring 4.6 cm in diameter, previously, 2.8 cm. 2. Post intra medullary and dynamic screw fixation of the right femur and femoral neck, incompletely imaged and evaluated - further evaluation could be performed with dedicated radiographs of the right hip and/or femur as clinically indicated. 3. Severe degenerative change of the right  hip. 4. Unchanged ill-defined area of linear sclerosis within the right sacral ala with differential considerations again including osseous metastatic disease versus is a sacral insufficiency fracture.   Electronically Signed   By: Sandi Mariscal M.D.   On: 02/22/2014 18:20   Mr Lumbar Spine W Wo Contrast  02/24/2014   CLINICAL DATA:  Bilateral lower extremity weakness and right hip/sacral area pain. History of metastatic lung cancer. Evaluate for mass or degenerative nerve compression.  EXAM: MRI LUMBAR SPINE WITHOUT AND WITH CONTRAST  TECHNIQUE: Multiplanar and multiecho pulse sequences of the lumbar spine were obtained without and with intravenous contrast.  CONTRAST:  72mL MULTIHANCE GADOBENATE DIMEGLUMINE 529 MG/ML IV SOLN  COMPARISON:  CT abdomen and pelvis 02/22/2014  FINDINGS: The lowest fully formed intervertebral disc space is labeled L5-S1.  There is no significant listhesis. There is no evidence of compression fracture. There is diffuse, severe disc space narrowing throughout the lumbar spine with degenerative endplate changes. Multiple small Schmorl's nodes are noted. Subcentimeter foci of enhancement abutting the endplates at multiple levels in the lumbar spine are favored to be degenerative, although small metastases would be difficult to completely exclude.  There is masslike marrow replacement and enhancement in the right sacral ala extending into the right posterolateral S1 vertebral body measuring approximately 4.6 x 3.5 cm with surrounding edema. This corresponds to the sclerosis and hypermetabolism previously described on CT and PET. Curvilinear low signal within this region of abnormality may reflect a superimposed pathologic fracture corresponding to more linear abnormality on CT. Enhancing tissue extends medially into the S1-2 neural foramen and contacts and mildly displaces the right S1 nerve root.  The lesion may also extend into the inferior aspect of the right L5-S1 neural foramen and  contribute to severe foraminal stenosis. Conus medullaris is normal in signal and terminates at T12-L1.  T10-11: Only imaged sagittally. Mild disc bulge without evidence of high-grade stenosis.  T11-12: Only imaged sagittally. Minimal disc bulging without stenosis.  T12-L1: Mild disc bulge results in mild bilateral lateral recess stenosis without spinal canal or neural foraminal stenosis.  L1-2: Moderate circumferential disc bulging and mild facet and ligamentum flavum hypertrophy result in mild to moderate lateral recess stenosis, mild spinal stenosis, and mild bilateral neural foraminal stenosis.  L2-3: Prominent circumferential disc bulging and moderate facet and ligamentum flavum hypertrophy result in severe spinal stenosis and mild bilateral neural foraminal stenosis.  L3-4: Moderate circumferential disc bulging and mild-to-moderate facet and ligamentum flavum hypertrophy result in bilateral lateral recess stenosis, moderate spinal stenosis, and mild bilateral neural foraminal stenosis.  L4-5: Evidence of prior left laminotomy. Circumferential disc bulging, superimposed central disc protrusion, and facet hypertrophy result in severe spinal stenosis and moderate right and mild left neural foraminal stenosis.  L5-S1: Disc bulging and moderate to severe facet hypertrophy resulting moderate spinal stenosis and severe bilateral neural foraminal stenosis.  IMPRESSION: 1. Abnormal appearance of the right sacrum consistent with metastasis. The lesion extends into the right S1-2 neural foramen, affecting the right S1 nerve. There is likely an associated pathologic fracture. 2. Severe, diffuse disc degeneration throughout the lumbar spine as above, resulting in severe spinal stenosis at L2-3 and L4-5 and severe bilateral neural foraminal stenosis at L5-S1.   Electronically Signed   By: Logan Bores   On: 02/24/2014 18:41   Ct Abdomen Pelvis W Contrast  02/22/2014   CLINICAL DATA:  Right-sided weakness radiating to the  right hip and leg for 1 week. History of lung, liver and hip cancer.  EXAM: CT CHEST, ABDOMEN, AND PELVIS WITH CONTRAST  TECHNIQUE: Multidetector CT imaging of the chest, abdomen and pelvis was performed following the standard protocol during bolus administration of intravenous contrast.  CONTRAST:  79mL OMNIPAQUE IOHEXOL 300 MG/ML SOLN, 2mL OMNIPAQUE IOHEXOL 300 MG/ML SOLN  COMPARISON:  CT the chest, abdomen pelvis - 01/07/2021  FINDINGS: CT CHEST FINDINGS  Ill-defined soft tissue about the medial aspect of the right upper lobe is grossly unchanged, measuring approximately 1.4 x 3.3 cm (image 27, series 7) as is the ill-defined soft tissue about the more caudal aspect of the right hilum measuring approximately 1.6 cm (image 34, series 7). Grossly unchanged right upper lobe volume loss. There is unchanged complete collapse of the right middle lobe. Perihilar bronchiectasis and architectural distortion are unchanged compatible with sequela of prior radiation therapy.  There is minimal subsegmental atelectasis within the dependent portion of the left lower lobe. No new focal airspace opacities. No pleural effusion or pneumothorax. The central pulmonary airways are otherwise patent as described.  Unchanged punctate (approximately 7 mm) calcified granuloma within the right upper lobe (image 18, series 7) and partially calcified mediastinal and right hilar lymph nodes, the sequela of prior granulomatous infection.  No worsening mediastinal or hilar lymphadenopathy. No axillary lymphadenopathy.  Normal heart size. Coronary artery calcifications. Atherosclerotic plaque within a normal caliber thoracic aorta.  Conventional configuration of the aortic arch. The origin and proximal aspects of the branch vessels of the aortic arch (in particular, the left subclavian artery) contain a moderate amount of calcified plaque though remain widely patent. No thoracic aortic dissection or periaortic stranding. Although this  examination was not  tailored for the evaluation of the pulmonary arteries, there are no discrete filling defects within the central pulmonary arterial tree to suggest central pulmonary embolism.  CT ABDOMEN AND PELVIS FINDINGS  Normal hepatic contour. There is progressive metastatic disease within the liver. Dominant centrally necrotic mass within the caudal aspect of the lateral segment of the left lobe of the liver has increased in size, now measuring 4.6 x 3.8 cm (image 60, series 3), previously, 2.8 x 2.5 cm, it is now associated with a minimal amount of adjacent intrahepatic biliary duct dilatation (image 55, series 3). Interval increase in size of the ill-defined hypo attenuating mass with the central aspect of the right lobe of the liver, now measuring approximately 1.5 cm (image 46), previously, 1.2 cm. The ill-defined area of hyper enhancement within the dome of the right lobe of the liver (image 36, series 3) is unchanged though also remains worrisome for an additional area of metastatic disease. There is a minimal amount of focal fatty infiltration adjacent to the fissure for ligamentum teres. Normal appearance of the gallbladder. No ascites.  There is symmetric enhancement and excretion of the bilateral kidneys. No renal stones on this postcontrast examination. Left-sided hypoattenuating renal lesions too small to adequately characterize of favored to represent renal cysts. No urinary obstruction or perinephric stranding.  Unchanged mild thickening of the medial limb of the left adrenal gland (image 56, series 5, too small to adequately characterize. Normal appearance of the right adrenal gland and spleen. The pancreas remains atrophic.  Moderate colonic stool burden without evidence of obstruction. Enteric contrast extends to the level of the hepatic flexure of the colon. The bowel is normal in course and caliber without wall thickening or evidence of obstruction. The appendix is not visualized, however  there is no pericecal inflammatory change. No pneumoperitoneum, pneumatosis or portal venous gas.  Moderate amount of mixed calcified and noncalcified atherosclerotic plaque within a normal caliber abdominal aorta. The major branch vessels of the abdominal aorta appear patent on this non CTA examination. No retroperitoneal, mesenteric, pelvic or inguinal lymphadenopathy.  Post hysterectomy. No discrete adnexal lesions. No free fluid in the pelvic cul-de-sac.  Ill-defined area of linear sclerosis within the right sacral ale is unchanged (coronal images 55 and 56, series 4). Post intra medullary and dynamic screw fixation of the proximal femur, incompletely imaged. Severe degenerative change the right hip with joint space loss, articular surface irregularity and sclerosis. 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  IMPRESSION: Chest Impression  1. Stable post treatment change of the medial aspect of the right upper lobe and right hilum, similar to the 01/07/2014 examination. No evidence of progressive metastatic disease within the chest. 2. Coronary artery calcifications. Abdomen and Pelvis Impression:  1. Progressive metastatic disease within the liver with dominant lesion within the left lobe of the liver now measuring 4.6 cm in diameter, previously, 2.8 cm. 2. Post intra medullary and dynamic screw fixation of the right femur and femoral neck, incompletely imaged and evaluated - further evaluation could be performed with dedicated radiographs of the right hip and/or femur as clinically indicated. 3. Severe degenerative change of the right hip. 4. Unchanged ill-defined area of linear sclerosis within the right sacral ala with differential considerations again including osseous metastatic disease versus is a sacral insufficiency fracture.   Electronically Signed   By: Sandi Mariscal M.D.   On: 02/22/2014 18:20       IMPRESSION: The patient has metastatic non-small cell lung cancer. She had some  weakness in the  setting of right lower back/pelvic pain. The patient weakness is likely multifactorial but she does have a sacral metastasis which likely contributes to her dominate complaint which his pain and weakness in this region. I believe it would be reasonable to consider palliative radiation treatment to this area if she wishes to continue with further management. She feels strongly that she does wish to proceed with this.  I discussed with the patient therefore a potential 5 fraction course of radiation treatment to the right sacrum. The patient could undergo CT simulation on Monday and potentially we could start later that day. Hopefully therefore we would be able to finish her treatment next Friday. I discussed with the patient the rationale of treatment as well as for possible side effects and risks. All of her questions were answered.   PLAN: The patient will undergo CT simulation on Monday such that we can proceed with treatment planning. Again I anticipate treating the patient to 20 gray in 5 fractions and hopefully we can begin treatment on Monday as well.    ________________________________   Jodelle Gross, MD, PhD   **Disclaimer: This note was dictated with voice recognition software. Similar sounding words can inadvertently be transcribed and this note may contain transcription errors which may not have been corrected upon publication of note.**

## 2014-02-25 NOTE — Progress Notes (Addendum)
Met with Kortney this AM. She continues to have severe pain. I reviewed the results of her MRI with her-she has cancer in her sacral ala and pathological fracture. Her prognosis is overwhelmingly poor. I provided very direct information. We talked about chemotherapy-she is waiting on Dr. Earlie Server to give her information of some "different chemo". I talked with her again about Hospice Care, QOL, risks benefits of additional aggressive treatment and also about preserving dignity-pain relief and trying to secure a good environment for her EOL care. Received call from Saralyn Pilar with [facility]- Reda has given me permission to speak with her.  1. Pathologic Sacral Insufficiency Fracture (MRI 9/10)/Moderate to severe pain  RT alone may provide symptomatic relief  Could consider vertebroplasty of the sacrum-stabilization and pain relief-may or may not be helpful  Maximize bone pain regimen- I reviewed the record, does not appear that she has had a bisphosphonate-renal function is normal, no major GIB recently.  Continue Steroids- will change her to a low dose of decadron oral BID- I suspect she will need these to control her bone pain from metastatic disease-possibly even long term.  Add on NSAID +PPI for adjuvant pain control  She is not on a basal dose of opiate-she tells me pain limits her getting out of bed-she says she is fine until she moves-she has gotten 2-3 doses of PRN oxycodone a day. She tells me she doesn't want to be "knocked out".  2. Advanced Lung Cancer  Very poor prognosis- she is essentially bed bound now- <4 weeks likely- I do not think she would be able to even make an appointment for chemo as an outpatient-but we will try to get her pain under better control.  I recommended hospice strongly with 24/7 in home caregivers- difficult situation at home  3. Palliative Psychosocial Issues:  Chaplain consult- for additional support  Provided reassurance  She is getting  ready to call her daughter, I will check back in to see how that conversation went.  [facility] was in to see her this AM- Saralyn Pilar.  She was very open to discussing Palliative options- and even Hospice Care. I asked her what she expected from chemo..she told me cure. I told her this was not possible. I presented options and discussed possible disease trajectories-she is considering next steps.   Will follow closely.  Lane Hacker, DO Palliative Medicine

## 2014-02-25 NOTE — Progress Notes (Signed)
PT Cancellation Note  ___Treatment cancelled today due to medical issues with patient which prohibited   therapy  ___ Treatment cancelled today due to patient receiving procedure or test   _X_ Treatment cancelled today due to patient's request.  Had a "bad morning with pain and just now got comfortable".  ___ Treatment cancelled today due to  Rica Koyanagi  PTA [facility]  Acute  Rehab Pager      [phone]

## 2014-02-25 NOTE — Progress Notes (Signed)
Lengthy visit. Patient has a very interesting life. She was doing a lot of reminiscing today. Her narratives give her meaning, connectedness to others, and hope.  We explored emotional and spiritual resources for coping and strength. She asked for prayer and specifically asked for prayer for strength. Later when we talked, I helped her notice places in her past where she was strong and to use these remembrances to help her understand who she is and what she needs. She has a daughter, grandson, and dog that she loves dearly.   Epifania Gore, PhD, [place]

## 2014-02-25 NOTE — Progress Notes (Signed)
Clinical Social Work  CSW and CM met with patient at bedside. Patient reports she lives at home with her dtr and grandson (who is 65 years old) and had caregivers at home. Patient reports that she was doing well at home and enjoyed being at home so that she could play with her grandson and dog. Patient reports she is comfortable at home and would like to return home if possible but that family was requesting SNF placement. CSW provided patient insurance information and spoke about SNF placement. Patient agreeable to [facility] search but reports she has not made a final decision re: placement. CSW suggested that search be completed and then patient could have options if needed. Patient thanked CSW and CM for visit and agreeable for continued follow up to assist as needed.  CSW will continue to follow to provide bed offers and to assist with DC planning.  Sindy Messing, LCSW (Coverage for [employer])

## 2014-02-25 NOTE — Progress Notes (Addendum)
Clinical Social Work Department CLINICAL SOCIAL WORK PLACEMENT NOTE 02/25/2014  Patient:  VERNICA, WACHTEL  Account Number:  [id number] Admit date:  02/22/2014  Clinical Social Worker:  Sindy Messing, LCSW  Date/time:  02/25/2014 10:00 AM  Clinical Social Work is seeking post-discharge placement for this patient at the following level of care:   [place]   (*CSW will update this form in Epic as items are completed)   02/25/2014  Patient/family provided with [place] Department of Clinical Social Work's list of facilities offering this level of care within the geographic area requested by the patient (or if unable, by the patient's family).  02/25/2014  Patient/family informed of their freedom to choose among providers that offer the needed level of care, that participate in Medicare, Medicaid or managed care program needed by the patient, have an available bed and are willing to accept the patient.  02/25/2014  Patient/family informed of [facility]' ownership interest in [facility], as well as of the fact that they are under no obligation to receive care at this facility.  PASARR submitted to EDS on 02/25/2014 PASARR number received on 02/25/2014  FL2 transmitted to all facilities in geographic area requested by pt/family on  02/25/2014 FL2 transmitted to all facilities within larger geographic area on   Patient informed that his/her managed care company has contracts with or will negotiate with  certain facilities, including the following:     Patient/family informed of bed offers received:  02/27/2014 Patient chooses bed at [facility] Physician recommends and patient chooses bed at    Patient to be transferred to  on  [place] on 03/01/2014 Patient to be transferred to facility by ambulance ([facility]) Patient and family notified of transfer on 03/01/2014 Name of family member notified:  Pt notified at bedside, pt sister-in-law,  Ambrose Mantle notified via telephone  The following physician request were entered in Epic:   Additional Comments:  Alison Murray, MSW, [place] Work [phone]

## 2014-02-26 LAB — BASIC METABOLIC PANEL
Anion gap: 12 (ref 5–15)
BUN: 12 mg/dL (ref 6–23)
CALCIUM: 9.3 mg/dL (ref 8.4–10.5)
CO2: 25 mEq/L (ref 19–32)
Chloride: 98 mEq/L (ref 96–112)
Creatinine, Ser: 0.47 mg/dL — ABNORMAL LOW (ref 0.50–1.10)
GLUCOSE: 147 mg/dL — AB (ref 70–99)
POTASSIUM: 4.8 meq/L (ref 3.7–5.3)
Sodium: 135 mEq/L — ABNORMAL LOW (ref 137–147)

## 2014-02-26 MED ORDER — ALBUTEROL SULFATE 2 MG/5ML PO SYRP: 2.0000 mg | ORAL_SOLUTION | Freq: Every day | ORAL | Status: DC

## 2014-02-26 MED ORDER — ALBUTEROL SULFATE (2.5 MG/3ML) 0.083% IN NEBU
2.5000 mg | INHALATION_SOLUTION | Freq: Every day | RESPIRATORY_TRACT | Status: DC
  Administered 2014-02-27 – 2014-03-01 (×3): 2.5 mg via RESPIRATORY_TRACT
  Filled 2014-02-26 (×4): qty 3

## 2014-02-26 NOTE — Progress Notes (Signed)
Progress Note   Stephanie Oliver SPQ:[id number] DOB: May 16, 1951 DOA: 02/22/2014 PCP: [name], MD   Brief Narrative:   Stephanie Oliver is an 63 y.o. female with a PMH of metastatic non-small cell lung cancer status post palliative chemotherapy and radiation therapy, depression, hypertension and osteoporosis was referred to the hospital by her oncologist for evaluation of progressive weakness. The patient clearly cannot adequately care for herself and does not have consistent 24-hour supervision in the home. She cannot bear weight secondary significant lower extremity weakness. Upon initial evaluation in the ED, the patient was noted to have significant lower extremity weakness and a CT scan of her abdomen and pelvis revealed possible sacral insufficiency fracture versus metastatic involvement of the sacrum. Urinalysis was negative for signs of infection.   Assessment/Plan:   Principal Problem:   Lower extremity weakness, multifactorial with CT findings of sacral insufficiency fracture versus metastasis to the sacrum / fracture of screw in distal femur / severe osteoarthritis of the right hip / peripheral neuropathy all contributory  Seen by orthopedics 02/24/14, has a intramedullary rod screw fracture suggestive of hardware loosening of the right femur, which is where she is having her pain.  Fracture felt to be healed with broken interlock screw occurring prior to union, no further intervention needed per orthopedic M.D.  CT head negative for brain metastasis.  MRI of the lumbosacral spine consistent with sacral metastasis and severe spinal stenosis secondary to disc degeneration throughout the lumbar spine. Started on prednisone dose pack.  Seen by radiation oncologist 02/25/14 with plans to proceed with 20 grays of radiation therapy delivered in 5 fractions to the right sacrum.  Known peripheral neuropathy with foot drop from history of treatment with Taxol likely contributory.Continue  gabapentin.  PT/OT evaluations requested.  Palliative care meds with patient 02/23/14 to discuss goals of care are given poor prognosis secondary to underlying metastatic lung cancer. Continues to want full CODE STATUS and further treatment of her cancer if indicated.  Active Problems:   Family dynamics problem  The patient has a history of "borderline personality "according to sister-in-law who lives in [place]. She has 2 brothers that live there.  She lives with her daughter who also has a personality disorder and who, according to family members in [place] and her paid caregivers, do not provide her with any care in the home. The family in [place] he is willing for her to come and live with them, however the patient prefers to remain in her home so that she can have access to her grandchild.  The patient tells me that her daughter "is mad at her "because she is" denial "about her cancer and is going to die. The patient tells me she promised her daughter/grandson that she would take care of them and feels that the longer she lives, the more she can care for their needs.  At this time, the patient is unsafe to return to her home situation as the family in [place] have discontinued service of her her paid caregivers.  The patient is in a fair amount of denial regarding her diagnosis and prognosis, and gives caregivers and family diferring updates regarding what the doctors have told her.  Palliative care assisting with disposition issues.    Hypokalemia  Supplemented.    UTI  Many bacteria and leukocytes noted on urinalysis. Urine culture on grew 50,000 colonies of Escherichia coli. Status post empiric Cipro for 3 days of treatment. Doubt this explains her severe weakness.  Presyncope / hypotension / history of hypertension  Patient was presyncopal prior to being returned to the bed after being mobilized to the bedside commode.  Fecal occult blood negative.  Hypotension thought  to be orthostatic, given 500 cc bolus of normal saline no further episodes.  Hold antihypertensives with parameters. Lasix D/C'd 02/23/14. BP improved.    Intertrochanteric fracture of right hip  Status post intra medullary and dynamic screw fixation of the right femur and femoral neck 06/22/13.  Radiographs show fracture of the screw with lucency around the screw suggesting hardware loosening.  Orthopedics evaluation done 02/24/14, fracture is healed. PT evaluation.    Severe protein calorie malnutrition   Patient appears cachectic.   Pt meets criteria for severe MALNUTRITION in the context of chronic illness as evidenced by PO intake < 75% for > one month, 15% body weight loss in 6 months, severe muscle wasting and subcutaneous fat loss.  Continue supplements as recommended.    Osteoporosis  Consider outpatient treatment with a bisphosphonate.    Bipolar 1 disorder  Not currently on any mood stabilizing medications. Continue Xanax as needed.    PAF (paroxysmal atrial fibrillation)  On chronic Eliquis.    Continue amiodarone, digoxin and Cardizem as blood pressure tolerates.      Non-small cell cancer of right lung with mediastinal lymphadenopathy  Status post palliative chemotherapy/radiation therapy with plans to repeat CT scans in 2 months for consideration of second line treatment.  Stable disease noted in chest, but progression of disease noted in liver. LFTs stable.  Will discuss treatment plan with Dr. Julien Nordmann.    Foot drop / Peripheral neuropathy  Secondary history of therapy with Taxol. Continue Neurontin.    Hypothyroidism  Continue Synthroid. TSH elevated at 10.460. Free T4 WNL, would not adjust Synthroid further. Recheck TSH in 4 weeks.    Normocytic anemia  Continue iron supplement and folate.  Serum B12/folate WNL. Stools Hemoccult negative.    DVT Prophylaxis  On therapeutic anticoagulation.  Code Status: Full. Family Communication:  Fermin Schwab [phone] 02/25/14, message left today. Disposition Plan: Home when stable.   IV Access:    Peripheral IV   Procedures and diagnostic studies:   Dg Pelvis 1-2 Views 02/22/2014: 1. Diffuse severe osteopenia and degenerative changes lumbar spine and both hips. 2. Open reduction internal fixation right hip fracture. This appears stable. 3. No acute abnormality.    Dg Femur Right 02/22/2014: 1. The distal transfixing femoral intra medullary rod screw is fractured suggestive of hardware loosening. 2. Severe degenerative change of the right hip.      Ct Chest W Contrast 02/22/2014: 1. Stable post treatment change of the medial aspect of the right upper lobe and right hilum, similar to the 01/07/2014 examination. No evidence of progressive metastatic disease within the chest. 2. Coronary artery calcifications.  Ct Abdomen Pelvis W Contrast 02/22/2014:  1. Progressive metastatic disease within the liver with dominant lesion within the left lobe of the liver now measuring 4.6 cm in diameter, previously, 2.8 cm. 2. Post intra medullary and dynamic screw fixation of the right femur and femoral neck, incompletely imaged and evaluated - further evaluation could be performed with dedicated radiographs of the right hip and/or femur as clinically indicated. 3. Severe degenerative change of the right hip. 4. Unchanged ill-defined area of linear sclerosis within the right sacral ala with differential considerations again including osseous metastatic disease versus is a sacral insufficiency fracture.     Ct Head W Wo Contrast  02/23/2014: Atrophy and chronic microvascular ischemia.  Negative for metastatic disease.     Mr Lumbar Spine W Wo Contrast 02/24/2014: 1. Abnormal appearance of the right sacrum consistent with metastasis. The lesion extends into the right S1-2 neural foramen, affecting the right S1 nerve. There is likely an associated pathologic fracture. 2. Severe, diffuse disc  degeneration throughout the lumbar spine as above, resulting in severe spinal stenosis at L2-3 and L4-5 and severe bilateral neural foraminal stenosis at L5-S1.    Medical Consultants:    Dr. Edmonia Lynch, Orthopedics  Dr. Billey Chang, Palliative care   Other Consultants:    Physical therapy  Occupational therapy  Dietitian   Anti-Infectives:    Cipro 02/22/14---> 02/25/14.  Subjective:   Stephanie Oliver continues to complain of right leg pain. Appetite fair. No nausea or vomiting. No BM in 2 days.  Objective:    Filed Vitals:   02/25/14 0440 02/25/14 1500 02/25/14 2102 02/26/14 0558  BP: 138/77 112/72 136/73 144/79  Pulse: 90 86 85 91  Temp: 97.9 F (36.6 C) 98.2 F (36.8 C) 98.3 F (36.8 C) 98.4 F (36.9 C)  TempSrc: Oral Oral Oral Oral  Resp: 16 18 16 15   Height:      Weight: 51.438 kg (113 lb 6.4 oz)     SpO2: 96% 97% 95% 96%    Intake/Output Summary (Last 24 hours) at 02/26/14 0833 Last data filed at 02/25/14 1600  Gross per 24 hour  Intake      0 ml  Output    300 ml  Net   -300 ml    Exam: Gen:  NAD, cachectic-appearing Cardiovascular:  RRR, No M/R/G Respiratory:  Lungs CTAB Gastrointestinal:  Abdomen soft, NT/ND, + BS Extremities:  No C/E/C   Data Reviewed:    Labs: Basic Metabolic Panel:  Recent Labs Lab 02/22/14 1440 02/23/14 0415 02/24/14 0400 02/24/14 2202 02/26/14 0426  NA 138 136* 134* 138 135*  K 3.7 3.2* 3.1* 4.7 4.8  CL 92* 93* 95* 98 98  CO2 32 30 29 25 25   GLUCOSE 86 93 92 160* 147*  BUN 11 9 9 7 12   CREATININE 0.54 0.50 0.58 0.49* 0.47*  CALCIUM 9.9 9.6 9.2 9.9 9.3  MG  --   --  1.5  --   --    GFR Estimated Creatinine Clearance: 57.6 ml/min (by C-G formula based on Cr of 0.47). Liver Function Tests:  Recent Labs Lab 02/22/14 1531 02/23/14 0415  AST 25 19  ALT 15 13  ALKPHOS 117 104  BILITOT 0.4 0.4  PROT 7.4 6.6  ALBUMIN 3.3* 2.8*   Coagulation profile  Recent Labs Lab 02/23/14 0415  INR  1.16    CBC:  Recent Labs Lab 02/22/14 1440 02/23/14 0415 02/24/14 0400  WBC 9.5 6.7 6.1  HGB 10.3* 9.7* 8.9*  HCT 31.5* 28.8* 26.7*  MCV 93.5 91.1 91.4  PLT 277 249 239   BNP (last 3 results)  Recent Labs  06/23/13 0830 06/24/13 0309  PROBNP 680.4* 995.0*   Microbiology Recent Results (from the past 240 hour(s))  URINE CULTURE     Status: None   Collection Time    02/22/14  5:16 PM      Result Value Ref Range Status   Specimen Description URINE, CATHETERIZED   Final   Special Requests NONE   Final   Culture  Setup Time     Final   Value: 02/23/2014 04:49     Performed at [facility]   Colony Count     Final   Value: 50,000 COLONIES/ML     Performed at [facility]     Final   Value: ESCHERICHIA COLI     Performed at [facility]   Report Status 02/25/2014 FINAL   Final   Organism ID, Bacteria ESCHERICHIA COLI   Final     Medications:   . amiodarone  200 mg Oral Daily  . apixaban  5 mg Oral BID  . cholecalciferol  1,000 Units Oral Daily  . dexamethasone  2 mg Oral Q12H  . digoxin  0.125 mg Oral Daily  . diltiazem  180 mg Oral Daily  . diphenhydrAMINE  25 mg Oral QHS  . folic acid  1 mg Oral Daily  . iron polysaccharides  150 mg Oral Daily  . ketorolac  15 mg Intravenous 3 times per day  . lactose free nutrition  237 mL Oral BID BM  . levothyroxine  25 mcg Oral QAC breakfast  . lidocaine  1 patch Transdermal Q24H  . pantoprazole  40 mg Oral Daily  . polyethylene glycol  17 g Oral Daily  . potassium chloride SA  20 mEq Oral Daily  . pregabalin  25 mg Oral QHS  . thiamine  100 mg Oral Daily   Continuous Infusions: . 0.9 % NaCl with KCl 20 mEq / L 100 mL/hr at 02/26/14 0323    Time spent: 25 minutes.    LOS: 4 days   [facility] Pager [phone]. If unable to reach me by pager, please call my cell phone at [phone].  *Please refer to [email or web address], password [email or web address] to get updated schedule on  who will round on this patient, as hospitalists switch teams weekly. If 7PM-7AM, please contact night-coverage at [email or web address], password [email or web address] for any overnight needs.  02/26/2014, 8:33 AM

## 2014-02-26 NOTE — Progress Notes (Signed)
ANTICOAGULATION CONSULT NOTE - Follow up  Pharmacy Consult for apixaban Indication: atrial fibrillation  Allergies  Allergen Reactions  . Paxil [Paroxetine] Diarrhea, Nausea Only and Other (See Comments)    Also dizziness  . Lisinopril Diarrhea and Nausea Only  . Penicillins Hives    Patient Measurements: Height: 5\' 3"  (160 cm) Weight: 113 lb 6.4 oz (51.438 kg) IBW/kg (Calculated) : 52.4  Vital Signs: Temp: 98.4 F (36.9 C) (09/12 0558) Temp src: Oral (09/12 0558) BP: 144/79 mmHg (09/12 0558) Pulse Rate: 91 (09/12 0558)  Labs:  Recent Labs  02/24/14 0400 02/24/14 2202 02/26/14 0426  HGB 8.9*  --   --   HCT 26.7*  --   --   PLT 239  --   --   CREATININE 0.58 0.49* 0.47*    Estimated Creatinine Clearance: 57.6 ml/min (by C-G formula based on Cr of 0.47).   Medical History: Past Medical History  Diagnosis Date  . Depression   . Panic attacks   . Hypertension   . Smoker   . Alcohol abuse   . History of radiation therapy 07/28/13-08/25/13    rt lung,37.5Gy/39fx  . Cancer     lung ca    Medications:  Scheduled:  . amiodarone  200 mg Oral Daily  . apixaban  5 mg Oral BID  . cholecalciferol  1,000 Units Oral Daily  . dexamethasone  2 mg Oral Q12H  . digoxin  0.125 mg Oral Daily  . diltiazem  180 mg Oral Daily  . diphenhydrAMINE  25 mg Oral QHS  . folic acid  1 mg Oral Daily  . iron polysaccharides  150 mg Oral Daily  . ketorolac  15 mg Intravenous 3 times per day  . lactose free nutrition  237 mL Oral BID BM  . levothyroxine  25 mcg Oral QAC breakfast  . lidocaine  1 patch Transdermal Q24H  . pantoprazole  40 mg Oral Daily  . polyethylene glycol  17 g Oral Daily  . potassium chloride SA  20 mEq Oral Daily  . pregabalin  25 mg Oral QHS  . thiamine  100 mg Oral Daily    Assessment: 63 yoF admitted 9/8 with LE weakness, this is a chronic problem worsened by peripheral neuropathy secondary to chemotherapy for metastatic NSCLC. Pt on apixaban 5mg  PO BID  for paroxysmal atrial fibrillation. Pharmacy has been consulted to continue this on admission  Wt=51kg (<60kg) Age=65 (<80 yo) SCr=0.5, baseline 0.7 (<1.5) 5mg  BID dose remains appropriate (only meets 1/3 requirements [weight] to decrease dose) Admit Hgb 10.3  9/12: No new CBC today (Hgb 8.9 on 9/10, plts ok). SCr stable. All doses charted. No bleeding issues noted.   Goal of Therapy:  apixaban dosed based on patient weight and renal function     Plan:  Will continue home dose of apixaban 5mg  po bid. bmet and CBC in AM  Thank you for the consult.  Currie Paris, PharmD, BCPS Pager: [phone] Pharmacy: [phone] 02/26/2014 1:49 PM

## 2014-02-27 DIAGNOSIS — M25559 Pain in unspecified hip: Secondary | ICD-10-CM

## 2014-02-27 LAB — CBC
HCT: 31.7 % — ABNORMAL LOW (ref 36.0–46.0)
Hemoglobin: 9.8 g/dL — ABNORMAL LOW (ref 12.0–15.0)
MCH: 29 pg (ref 26.0–34.0)
MCHC: 30.9 g/dL (ref 30.0–36.0)
MCV: 93.8 fL (ref 78.0–100.0)
Platelets: 293 10*3/uL (ref 150–400)
RBC: 3.38 MIL/uL — ABNORMAL LOW (ref 3.87–5.11)
RDW: 14.9 % (ref 11.5–15.5)
WBC: 9.4 10*3/uL (ref 4.0–10.5)

## 2014-02-27 LAB — BASIC METABOLIC PANEL
Anion gap: 12 (ref 5–15)
BUN: 21 mg/dL (ref 6–23)
CO2: 25 mEq/L (ref 19–32)
Calcium: 8.9 mg/dL (ref 8.4–10.5)
Chloride: 104 mEq/L (ref 96–112)
Creatinine, Ser: 0.48 mg/dL — ABNORMAL LOW (ref 0.50–1.10)
GFR calc Af Amer: >90 mL/min (ref >90)
GFR calc non Af Amer: >90 mL/min (ref >90)
GLUCOSE: 124 mg/dL — AB (ref 70–99)
POTASSIUM: 4.9 meq/L (ref 3.7–5.3)
Sodium: 141 mEq/L (ref 137–147)

## 2014-02-27 NOTE — Progress Notes (Signed)
CSW met with patient today to provide current SNF bed offers. She is still trying to decide if she wants to go home vs SNF but states that she would prefer [facility] if she decides on placement. This facility has not yet responded to referral and the Admissions Director is not working today. CSW can follow up tomorrow with [facility]- Admissions.  Patient states taht she lives at home with her daughter and grandson but daughter works during the day.  She is also concerned about her insurance Nurse, children's) which will require prior authorization.  Patient received a phone call during the visit and continued to talk for almost 10 minutes.  CSW indicated need to leave and she continued to talk on the phone as CSW left the room.  Will need to revisit with patient after determining possible bed offer from [facility].  Lorie Phenix. Pauline Good, [place]  (weekend coverage)

## 2014-02-27 NOTE — Progress Notes (Signed)
Progress Note   Stephanie Oliver ZSM:[id number] DOB: [birth year] DOA: 02/22/2014 PCP: [name], MD   Brief Narrative:   Stephanie Oliver is an 63 y.o. female with a PMH of metastatic non-small cell lung cancer status post palliative chemotherapy and radiation therapy, depression, hypertension and osteoporosis was referred to the hospital by her oncologist for evaluation of progressive weakness. The patient clearly cannot adequately care for herself and does not have consistent 24-hour supervision in the home. She cannot bear weight secondary significant lower extremity weakness. Upon initial evaluation in the ED, the patient was noted to have significant lower extremity weakness and a CT scan of her abdomen and pelvis revealed possible sacral insufficiency fracture versus metastatic involvement of the sacrum. Urinalysis was negative for signs of infection.   Assessment/Plan:   Principal Problem:   Lower extremity weakness, multifactorial with CT findings of sacral insufficiency fracture versus metastasis to the sacrum / fracture of screw in distal femur / severe osteoarthritis of the right hip / peripheral neuropathy all contributory  Seen by orthopedics 02/24/14, has a intramedullary rod screw fracture suggestive of hardware loosening of the right femur, which is where she is having her pain.  Fracture felt to be healed with broken interlock screw occurring prior to union, no further intervention needed per orthopedic M.D.  CT head negative for brain metastasis.  MRI of the lumbosacral spine consistent with sacral metastasis and severe spinal stenosis secondary to disc degeneration throughout the lumbar spine. Started on prednisone dose pack.  Seen by radiation oncologist 02/25/14 with plans to proceed with 20 grays of radiation therapy delivered in 5 fractions to the right sacrum.  Known peripheral neuropathy with foot drop from history of treatment with Taxol likely contributory.Continue  gabapentin.  PT/OT evaluations pending (declined evaluation 02/25/14).  Palliative care meds with patient 02/23/14 to discuss goals of care are given poor prognosis secondary to underlying metastatic lung cancer. Continues to want full CODE STATUS and further treatment of her cancer if indicated.  Active Problems:   Family dynamics problem  The patient has a history of "borderline personality "according to sister-in-law who lives in [place]. She has 2 brothers that live there.  She lives with her daughter who also has a personality disorder and who, according to family members in [place] and her paid caregivers, do not provide her with any care in the home. The family in [place] he is willing for her to come and live with them, however the patient prefers to remain in her home so that she can have access to her grandchild.  The patient tells me that her daughter "is mad at her "because she is" denial "about her cancer and is going to die. The patient tells me she promised her daughter/grandson that she would take care of them and feels that the longer she lives, the more she can care for their needs.  At this time, the patient is unsafe to return to her home situation as the family in [place] have discontinued service of her her paid caregivers.  The patient is in a fair amount of denial regarding her diagnosis and prognosis, and gives caregivers and family diferring updates regarding what the doctors have told her.  Palliative care assisting with disposition issues.    Hypokalemia  Supplemented.    UTI  Many bacteria and leukocytes noted on urinalysis. Urine culture on grew 50,000 colonies of Escherichia coli. Status post empiric Cipro for 3 days of treatment. Doubt this explains her severe  weakness.    Presyncope / hypotension / history of hypertension  Patient was presyncopal prior to being returned to the bed after being mobilized to the bedside commode.  Fecal occult blood  negative.  Hypotension thought to be orthostatic, given 500 cc bolus of normal saline no further episodes.  Hold antihypertensives with parameters. Lasix D/C'd 02/23/14. BP improved.     Intertrochanteric fracture of right hip  Status post intra medullary and dynamic screw fixation of the right femur and femoral neck 06/22/13.  Radiographs show fracture of the screw with lucency around the screw suggesting hardware loosening.  Orthopedics evaluation done 02/24/14, fracture is healed. PT evaluation still pending.    Severe protein calorie malnutrition   Patient appears cachectic.   Pt meets criteria for severe MALNUTRITION in the context of chronic illness as evidenced by PO intake < 75% for > one month, 15% body weight loss in 6 months, severe muscle wasting and subcutaneous fat loss.  Continue supplements as recommended.    Osteoporosis  Consider outpatient treatment with a bisphosphonate.    Bipolar 1 disorder  Not currently on any mood stabilizing medications. Continue Xanax as needed.    PAF (paroxysmal atrial fibrillation)  On chronic Eliquis.    Continue amiodarone, digoxin and Cardizem as blood pressure tolerates.      Non-small cell cancer of right lung with mediastinal lymphadenopathy  Status post palliative chemotherapy/radiation therapy with plans to repeat CT scans in 2 months for consideration of second line treatment.  Stable disease noted in chest, but progression of disease noted in liver. LFTs stable.  Will discuss treatment plan with Dr. Julien Nordmann.    Foot drop / Peripheral neuropathy  Secondary history of therapy with Taxol. Continue Neurontin.    Hypothyroidism  Continue Synthroid. TSH elevated at 10.460. Free T4 WNL, would not adjust Synthroid further. Recheck TSH in 4 weeks.    Normocytic anemia  Continue iron supplement and folate.  Serum B12/folate WNL. Stools Hemoccult negative.  Hemoglobin stable.    DVT Prophylaxis  On therapeutic  anticoagulation.  Code Status: Full. Family Communication: Fermin Schwab [phone] 02/25/14, message left again today. Disposition Plan: Home when stable.   IV Access:    Peripheral IV   Procedures and diagnostic studies:   Dg Pelvis 1-2 Views 02/22/2014: 1. Diffuse severe osteopenia and degenerative changes lumbar spine and both hips. 2. Open reduction internal fixation right hip fracture. This appears stable. 3. No acute abnormality.    Dg Femur Right 02/22/2014: 1. The distal transfixing femoral intra medullary rod screw is fractured suggestive of hardware loosening. 2. Severe degenerative change of the right hip.      Ct Chest W Contrast 02/22/2014: 1. Stable post treatment change of the medial aspect of the right upper lobe and right hilum, similar to the 01/07/2014 examination. No evidence of progressive metastatic disease within the chest. 2. Coronary artery calcifications.  Ct Abdomen Pelvis W Contrast 02/22/2014:  1. Progressive metastatic disease within the liver with dominant lesion within the left lobe of the liver now measuring 4.6 cm in diameter, previously, 2.8 cm. 2. Post intra medullary and dynamic screw fixation of the right femur and femoral neck, incompletely imaged and evaluated - further evaluation could be performed with dedicated radiographs of the right hip and/or femur as clinically indicated. 3. Severe degenerative change of the right hip. 4. Unchanged ill-defined area of linear sclerosis within the right sacral ala with differential considerations again including osseous metastatic disease versus is a sacral  insufficiency fracture.     Ct Head W Wo Contrast 02/23/2014: Atrophy and chronic microvascular ischemia.  Negative for metastatic disease.     Mr Lumbar Spine W Wo Contrast 02/24/2014: 1. Abnormal appearance of the right sacrum consistent with metastasis. The lesion extends into the right S1-2 neural foramen, affecting the right S1 nerve. There is likely an  associated pathologic fracture. 2. Severe, diffuse disc degeneration throughout the lumbar spine as above, resulting in severe spinal stenosis at L2-3 and L4-5 and severe bilateral neural foraminal stenosis at L5-S1.    Medical Consultants:    Dr. Edmonia Lynch, Orthopedics  Dr. Billey Chang, Palliative care   Other Consultants:    Physical therapy  Occupational therapy  Dietitian   Anti-Infectives:    Cipro 02/22/14---> 02/25/14.  Subjective:   Stephanie Oliver continues to complain of right leg pain. Appetite fair. No nausea or vomiting. No BM in 3 days, but is afraid to soil the bed and has trouble defecating on a bedpan.  Objective:    Filed Vitals:   02/26/14 1609 02/26/14 2040 02/26/14 2239 02/27/14 0644  BP:   141/75 144/82  Pulse:  88 87 86  Temp:   98.4 F (36.9 C) 98.5 F (36.9 C)  TempSrc:   Oral Oral  Resp:   16 16  Height:      Weight:      SpO2: 99%  94% 96%    Intake/Output Summary (Last 24 hours) at 02/27/14 0810 Last data filed at 02/26/14 2040  Gross per 24 hour  Intake 1827.34 ml  Output   1100 ml  Net 727.34 ml    Exam: Gen:  NAD, cachectic-appearing Cardiovascular:  RRR, No M/R/G Respiratory:  Lungs CTAB Gastrointestinal:  Abdomen soft, NT/ND, + BS Extremities:  No C/E/C   Data Reviewed:    Labs: Basic Metabolic Panel:  Recent Labs Lab 02/23/14 0415 02/24/14 0400 02/24/14 2202 02/26/14 0426 02/27/14 0521  NA 136* 134* 138 135* 141  K 3.2* 3.1* 4.7 4.8 4.9  CL 93* 95* 98 98 104  CO2 30 29 25 25 25   GLUCOSE 93 92 160* 147* 124*  BUN 9 9 7 12 21   CREATININE 0.50 0.58 0.49* 0.47* 0.48*  CALCIUM 9.6 9.2 9.9 9.3 8.9  MG  --  1.5  --   --   --    GFR Estimated Creatinine Clearance: 57.6 ml/min (by C-G formula based on Cr of 0.48). Liver Function Tests:  Recent Labs Lab 02/22/14 1531 02/23/14 0415  AST 25 19  ALT 15 13  ALKPHOS 117 104  BILITOT 0.4 0.4  PROT 7.4 6.6  ALBUMIN 3.3* 2.8*   Coagulation  profile  Recent Labs Lab 02/23/14 0415  INR 1.16    CBC:  Recent Labs Lab 02/22/14 1440 02/23/14 0415 02/24/14 0400 02/27/14 0521  WBC 9.5 6.7 6.1 9.4  HGB 10.3* 9.7* 8.9* 9.8*  HCT 31.5* 28.8* 26.7* 31.7*  MCV 93.5 91.1 91.4 93.8  PLT 277 249 239 293   BNP (last 3 results)  Recent Labs  06/23/13 0830 06/24/13 0309  PROBNP 680.4* 995.0*   Microbiology Recent Results (from the past 240 hour(s))  URINE CULTURE     Status: None   Collection Time    02/22/14  5:16 PM      Result Value Ref Range Status   Specimen Description URINE, CATHETERIZED   Final   Special Requests NONE   Final   Culture  Setup Time     Final  Value: 02/23/2014 04:49     Performed at [facility]     Final   Value: 50,000 COLONIES/ML     Performed at [facility]   Culture     Final   Value: ESCHERICHIA COLI     Performed at [facility]   Report Status 02/25/2014 FINAL   Final   Organism ID, Bacteria ESCHERICHIA COLI   Final     Medications:   . albuterol  2.5 mg Nebulization Daily  . amiodarone  200 mg Oral Daily  . apixaban  5 mg Oral BID  . cholecalciferol  1,000 Units Oral Daily  . dexamethasone  2 mg Oral Q12H  . digoxin  0.125 mg Oral Daily  . diltiazem  180 mg Oral Daily  . diphenhydrAMINE  25 mg Oral QHS  . folic acid  1 mg Oral Daily  . iron polysaccharides  150 mg Oral Daily  . ketorolac  15 mg Intravenous 3 times per day  . lactose free nutrition  237 mL Oral BID BM  . levothyroxine  25 mcg Oral QAC breakfast  . lidocaine  1 patch Transdermal Q24H  . pantoprazole  40 mg Oral Daily  . polyethylene glycol  17 g Oral Daily  . potassium chloride SA  20 mEq Oral Daily  . pregabalin  25 mg Oral QHS  . thiamine  100 mg Oral Daily   Continuous Infusions: . 0.9 % NaCl with KCl 20 mEq / L 20 mL/hr at 02/26/14 2040    Time spent: 25 minutes.    LOS: 5 days   [facility] Pager [phone]. If unable to  reach me by pager, please call my cell phone at [phone].  *Please refer to [email or web address], password [email or web address] to get updated schedule on who will round on this patient, as hospitalists switch teams weekly. If 7PM-7AM, please contact night-coverage at [email or web address], password [email or web address] for any overnight needs.  02/27/2014, 8:10 AM

## 2014-02-28 ENCOUNTER — Ambulatory Visit
Admit: 2014-02-28 | Discharge: 2014-02-28 | Disposition: A | Discharge status: Home / Self Care | Attending: Radiation Oncology | Admitting: Radiation Oncology

## 2014-02-28 ENCOUNTER — Telehealth: Payer: Self-pay | Admitting: *Deleted

## 2014-02-28 ENCOUNTER — Telehealth: Payer: Self-pay | Admitting: Internal Medicine

## 2014-02-28 ENCOUNTER — Other Ambulatory Visit: Payer: Self-pay | Admitting: *Deleted

## 2014-02-28 DIAGNOSIS — C7951 Secondary malignant neoplasm of bone: Secondary | ICD-10-CM

## 2014-02-28 DIAGNOSIS — Z79899 Other long term (current) drug therapy: Secondary | ICD-10-CM | POA: Insufficient documentation

## 2014-02-28 DIAGNOSIS — Z510 Encounter for antineoplastic radiation therapy: Secondary | ICD-10-CM | POA: Insufficient documentation

## 2014-02-28 DIAGNOSIS — C801 Malignant (primary) neoplasm, unspecified: Secondary | ICD-10-CM | POA: Insufficient documentation

## 2014-02-28 DIAGNOSIS — C3490 Malignant neoplasm of unspecified part of unspecified bronchus or lung: Secondary | ICD-10-CM | POA: Insufficient documentation

## 2014-02-28 MED ORDER — OXYCODONE HCL 5 MG PO TABS
5.0000 mg | ORAL_TABLET | ORAL | Status: DC | PRN
Start: 2014-02-28 — End: 2014-03-01

## 2014-02-28 NOTE — Telephone Encounter (Signed)
lvm for pt regarding to Sept appt cx on on 9.29.....mailed pt appt sched and letter

## 2014-02-28 NOTE — Progress Notes (Signed)
Patient [id number] Sarli      DOB: [birth year]      RNH:[id number]  Patient out of room for radiation . Will attempt to visit again later.  Dillard Pascal L. Lovena Le, MD MBA The Palliative Medicine Team at [facility] Phone: [phone] Pager: [phone] ( Use team phone after hours)

## 2014-02-28 NOTE — Progress Notes (Signed)
Progress Note   Stephanie Oliver HGD:[id number] DOB: [birth year] DOA: 02/22/2014 PCP: Stephanie Naas, MD   Brief Narrative:   Stephanie Oliver is an 63 y.o. female with a PMH of metastatic non-small cell lung cancer status post palliative chemotherapy and radiation therapy, depression, hypertension and osteoporosis was referred to the hospital by her oncologist for evaluation of progressive weakness. The patient clearly cannot adequately care for herself and does not have consistent 24-hour supervision in the home. She cannot bear weight secondary significant lower extremity weakness. Upon initial evaluation in the ED, the patient was noted to have significant lower extremity weakness and a CT scan of her abdomen and pelvis revealed possible sacral insufficiency fracture versus metastatic involvement of the sacrum. Urinalysis was negative for signs of infection.   Assessment/Plan:   Principal Problem:   Lower extremity weakness, multifactorial with CT findings of sacral insufficiency fracture versus metastasis to the sacrum / fracture of screw in distal femur / severe osteoarthritis of the right hip / peripheral neuropathy all contributory  Seen by orthopedics 02/24/14, has a intramedullary rod screw fracture suggestive of hardware loosening of the right femur, which is where she is having her pain.  Fracture felt to be healed with broken interlock screw occurring prior to union, no further intervention needed per orthopedic M.D.  CT head negative for brain metastasis.  MRI of the lumbosacral spine consistent with sacral metastasis and severe spinal stenosis secondary to disc degeneration throughout the lumbar spine. Started on prednisone dose pack.  Seen by radiation oncologist 02/25/14 with plans to proceed with 20 grays of radiation therapy delivered in 5 fractions to the right sacrum. First treatment today.  Known peripheral neuropathy with foot drop from history of treatment with Taxol likely  contributory.Continue gabapentin.  PT/OT evaluations pending (declined evaluation 02/25/14).  Palliative care meds with patient 02/23/14 to discuss goals of care are given poor prognosis secondary to underlying metastatic lung cancer. Continues to want full CODE STATUS and further treatment of her cancer if indicated.  Active Problems:   Family dynamics problem  The patient has a history of "borderline personality "according to sister-in-law who lives in [place]. She has 2 brothers that live there.  She lives with her daughter who also has a personality disorder and who, according to family members in [place] and her paid caregivers, do not provide her with any care in the home. The family in [place] he is willing for her to come and live with them, however the patient prefers to remain in her home so that she can have access to her grandchild.  The patient tells me that her daughter "is mad at her "because she is" denial "about her cancer and is going to die. The patient tells me she promised her daughter/grandson that she would take care of them and feels that the longer she lives, the more she can care for their needs.  At this time, the patient is unsafe to return to her home situation as the family in [place] have discontinued service of her her paid caregivers.  The patient is in a fair amount of denial regarding her diagnosis and prognosis, and gives caregivers and family diferring updates regarding what the doctors have told her.  Palliative care assisting with disposition issues.    Hypokalemia  Supplemented.    UTI  Many bacteria and leukocytes noted on urinalysis. Urine culture on grew 50,000 colonies of Escherichia coli. Status post empiric Cipro for 3 days of treatment. Doubt this  explains her severe weakness.    Presyncope / hypotension / history of hypertension  Patient was presyncopal prior to being returned to the bed after being mobilized to the bedside  commode.  Fecal occult blood negative.  Hypotension thought to be orthostatic, given 500 cc bolus of normal saline no further episodes.  Hold antihypertensives with parameters. Lasix D/C'd 02/23/14. BP improved.     Intertrochanteric fracture of right hip  Status post intra medullary and dynamic screw fixation of the right femur and femoral neck 06/22/13.  Radiographs show fracture of the screw with lucency around the screw suggesting hardware loosening.  Orthopedics evaluation done 02/24/14, fracture is healed. PT evaluation still pending.    Severe protein calorie malnutrition   Patient appears cachectic.   Pt meets criteria for severe MALNUTRITION in the context of chronic illness as evidenced by PO intake < 75% for > one month, 15% body weight loss in 6 months, severe muscle wasting and subcutaneous fat loss.  Continue supplements as recommended.    Osteoporosis  Consider outpatient treatment with a bisphosphonate.    Bipolar 1 disorder  Not currently on any mood stabilizing medications. Continue Xanax as needed.    PAF (paroxysmal atrial fibrillation)  On chronic Eliquis.    Continue amiodarone, digoxin and Cardizem as blood pressure tolerates.      Non-small cell cancer of right lung with mediastinal lymphadenopathy  Status post palliative chemotherapy/radiation therapy with plans to repeat CT scans in 2 months for consideration of second line treatment.  Stable disease noted in chest, but progression of disease noted in liver. LFTs stable.  Dr. Julien Nordmann will followup with her once she decides on whether or not she wants to proceed with palliative chemotherapy, the patient wants to see how she does with radiation before she makes a decision.    Foot drop / Peripheral neuropathy  Secondary history of therapy with Taxol. Continue Neurontin.    Hypothyroidism  Continue Synthroid. TSH elevated at 10.460. Free T4 WNL, would not adjust Synthroid further. Recheck TSH in 4  weeks.    Normocytic anemia  Continue iron supplement and folate.  Serum B12/folate WNL. Stools Hemoccult negative.  Hemoglobin stable.    DVT Prophylaxis  On therapeutic anticoagulation.  Code Status: Full. Family Communication: Fermin Schwab [phone] 02/25/14 updated by telephone today. Disposition Plan: SNF when stable.   IV Access:    Peripheral IV   Procedures and diagnostic studies:   Dg Pelvis 1-2 Views 02/22/2014: 1. Diffuse severe osteopenia and degenerative changes lumbar spine and both hips. 2. Open reduction internal fixation right hip fracture. This appears stable. 3. No acute abnormality.    Dg Femur Right 02/22/2014: 1. The distal transfixing femoral intra medullary rod screw is fractured suggestive of hardware loosening. 2. Severe degenerative change of the right hip.      Ct Chest W Contrast 02/22/2014: 1. Stable post treatment change of the medial aspect of the right upper lobe and right hilum, similar to the 01/07/2014 examination. No evidence of progressive metastatic disease within the chest. 2. Coronary artery calcifications.  Ct Abdomen Pelvis W Contrast 02/22/2014:  1. Progressive metastatic disease within the liver with dominant lesion within the left lobe of the liver now measuring 4.6 cm in diameter, previously, 2.8 cm. 2. Post intra medullary and dynamic screw fixation of the right femur and femoral neck, incompletely imaged and evaluated - further evaluation could be performed with dedicated radiographs of the right hip and/or femur as clinically indicated. 3.  Severe degenerative change of the right hip. 4. Unchanged ill-defined area of linear sclerosis within the right sacral ala with differential considerations again including osseous metastatic disease versus is a sacral insufficiency fracture.     Ct Head W Wo Contrast 02/23/2014: Atrophy and chronic microvascular ischemia.  Negative for metastatic disease.     Mr Lumbar Spine W Wo Contrast  02/24/2014: 1. Abnormal appearance of the right sacrum consistent with metastasis. The lesion extends into the right S1-2 neural foramen, affecting the right S1 nerve. There is likely an associated pathologic fracture. 2. Severe, diffuse disc degeneration throughout the lumbar spine as above, resulting in severe spinal stenosis at L2-3 and L4-5 and severe bilateral neural foraminal stenosis at L5-S1.    Medical Consultants:    Dr. Edmonia Lynch, Orthopedics  Dr. Billey Chang, Palliative care   Other Consultants:    Physical therapy  Occupational therapy  Dietitian   Anti-Infectives:    Cipro 02/22/14---> 02/25/14.  Subjective:   Lasharn Bufkin continues to complain of right leg pain. Appetite fair, patient says she is "trying to eat ". No nausea or vomiting. Bowels moved yesterday.  Objective:    Filed Vitals:   02/27/14 1415 02/27/14 2150 02/27/14 2210 02/28/14 0554  BP: 138/77  121/74 135/82  Pulse: 95 88 88 84  Temp: 98.7 F (37.1 C)  98.2 F (36.8 C) 98 F (36.7 C)  TempSrc: Oral  Oral Oral  Resp: 16  16 20   Height:      Weight:    51.8 kg (114 lb 3.2 oz)  SpO2: 97%  98% 96%    Intake/Output Summary (Last 24 hours) at 02/28/14 0824 Last data filed at 02/28/14 6629  Gross per 24 hour  Intake   1475 ml  Output   1800 ml  Net   -325 ml    Exam: Gen:  NAD, cachectic-appearing Cardiovascular:  RRR, No M/R/G Respiratory:  Lungs CTAB Gastrointestinal:  Abdomen soft, NT/ND, + BS Extremities:  No C/E/C   Data Reviewed:    Labs: Basic Metabolic Panel:  Recent Labs Lab 02/23/14 0415 02/24/14 0400 02/24/14 2202 02/26/14 0426 02/27/14 0521  NA 136* 134* 138 135* 141  K 3.2* 3.1* 4.7 4.8 4.9  CL 93* 95* 98 98 104  CO2 30 29 25 25 25   GLUCOSE 93 92 160* 147* 124*  BUN 9 9 7 12 21   CREATININE 0.50 0.58 0.49* 0.47* 0.48*  CALCIUM 9.6 9.2 9.9 9.3 8.9  MG  --  1.5  --   --   --    GFR Estimated Creatinine Clearance: 58.1 ml/min (by C-G formula  based on Cr of 0.48). Liver Function Tests:  Recent Labs Lab 02/22/14 1531 02/23/14 0415  AST 25 19  ALT 15 13  ALKPHOS 117 104  BILITOT 0.4 0.4  PROT 7.4 6.6  ALBUMIN 3.3* 2.8*   Coagulation profile  Recent Labs Lab 02/23/14 0415  INR 1.16    CBC:  Recent Labs Lab 02/22/14 1440 02/23/14 0415 02/24/14 0400 02/27/14 0521  WBC 9.5 6.7 6.1 9.4  HGB 10.3* 9.7* 8.9* 9.8*  HCT 31.5* 28.8* 26.7* 31.7*  MCV 93.5 91.1 91.4 93.8  PLT 277 249 239 293   BNP (last 3 results)  Recent Labs  06/23/13 0830 06/24/13 0309  PROBNP 680.4* 995.0*   Microbiology Recent Results (from the past 240 hour(s))  URINE CULTURE     Status: None   Collection Time    02/22/14  5:16 PM  Result Value Ref Range Status   Specimen Description URINE, CATHETERIZED   Final   Special Requests NONE   Final   Culture  Setup Time     Final   Value: 02/23/2014 04:49     Performed at [facility]     Final   Value: 50,000 COLONIES/ML     Performed at [facility]   Culture     Final   Value: ESCHERICHIA COLI     Performed at [facility]   Report Status 02/25/2014 FINAL   Final   Organism ID, Bacteria ESCHERICHIA COLI   Final     Medications:   . albuterol  2.5 mg Nebulization Daily  . amiodarone  200 mg Oral Daily  . apixaban  5 mg Oral BID  . cholecalciferol  1,000 Units Oral Daily  . dexamethasone  2 mg Oral Q12H  . digoxin  0.125 mg Oral Daily  . diltiazem  180 mg Oral Daily  . diphenhydrAMINE  25 mg Oral QHS  . folic acid  1 mg Oral Daily  . iron polysaccharides  150 mg Oral Daily  . ketorolac  15 mg Intravenous 3 times per day  . lactose free nutrition  237 mL Oral BID BM  . levothyroxine  25 mcg Oral QAC breakfast  . lidocaine  1 patch Transdermal Q24H  . pantoprazole  40 mg Oral Daily  . polyethylene glycol  17 g Oral Daily  . potassium chloride SA  20 mEq Oral Daily  . pregabalin  25 mg Oral QHS  . thiamine  100 mg Oral Daily    Continuous Infusions: . 0.9 % NaCl with KCl 20 mEq / L 20 mL/hr at 02/27/14 1900    Time spent: 25 minutes.    LOS: 6 days   [facility] Pager [phone]. If unable to reach me by pager, please call my cell phone at [phone].  *Please refer to [email or web address], password [email or web address] to get updated schedule on who will round on this patient, as hospitalists switch teams weekly. If 7PM-7AM, please contact night-coverage at [email or web address], password [email or web address] for any overnight needs.  02/28/2014, 8:24 AM

## 2014-02-28 NOTE — Progress Notes (Signed)
Clinical Social Work  Per weekend handoff, patient was interested in [facility] for SNF placement. CSW spoke with [facility] who reports they are unable to accept due to not being in-network with patient's insurance. CSW met with patient and explained that [facility] accept but gave other offers of [facility], [facility], [facility], and [facility]. Patient reports she will talk with family about offers but is going to radiation soon. Patient requested that CSW speak with her sister Stephanie Oliver) re: DC plans as well. CSW called sister and explained bed offers. Sister reports she will review SNFs and assist patient with making a decision re: SNF choice. CSW will continue to follow.  Stephanie Messing, LCSW (Coverage for [employer])

## 2014-02-28 NOTE — Progress Notes (Signed)
OT Cancellation Note  Patient Details Name: Marnee Sherrard MRN: [id number] DOB: 1951/03/19   Cancelled Treatment:    Reason Eval/Treat Not Completed: Pt states they are coming to get her for radiation any minute. Pt declines OT this day but wants to work with therapy tomorrow. We will try to work around her radiation Thanks,   Betsy Pries 02/28/2014, 1:29 PM

## 2014-02-28 NOTE — Progress Notes (Signed)
PT Cancellation Note  ___Treatment cancelled today due to medical issues with patient which prohibited therapy  _X_ Treatment cancelled today due to patient receiving procedure or test (Radiation)  ___ Treatment cancelled today due to patient's refusal to participate   ___ Treatment cancelled today due to   Rica Koyanagi  PTA [facility]  Acute  Rehab Pager      [phone]

## 2014-02-28 NOTE — Telephone Encounter (Addendum)
Pt's sister in law Ambrose Mantle called.  She wanted to know if patient is going to know about chemo she will be getting while hospitalized or outpatient.  Per Dr Vista Mink, he will f/u when pt is out patient.  Also advised that she is not on most up to date release of info form and in order to be able to get any info regarding pt the form needs to be updated.  She is going to work on getting a form for patient to fill out and bring it to the cancer center.  SLJ

## 2014-02-28 NOTE — Telephone Encounter (Signed)
Called the floor for room 1344,[facility], spoke with RN Joelene Millin, patient to be in rad dept at 0930 am for mark and start radiation to the sacrum, will be lyn on hard,flat able ct machine, please medicate for pain management prior to coming for the patient, per Joelene Millin patient is doing okay this am, ,thanked RN,called [employer] infomred patient to come down in bed and has IVF:s ongoing 8:12 AM

## 2014-02-28 NOTE — Progress Notes (Signed)
[facility] Therapy Treatment Record Phone [phone]   Radiation Therapy was administered to Stephanie Oliver on: 02/28/2014  2:06 PM and was treatment # 1 out of a planned course of 5 treatments.

## 2014-03-01 ENCOUNTER — Ambulatory Visit
Admit: 2014-03-01 | Discharge: 2014-03-01 | Disposition: A | Discharge status: Home / Self Care | Attending: Radiation Oncology | Admitting: Radiation Oncology

## 2014-03-01 DIAGNOSIS — M810 Age-related osteoporosis without current pathological fracture: Secondary | ICD-10-CM

## 2014-03-01 MED ORDER — PREGABALIN 25 MG PO CAPS: 25.0000 mg | ORAL_CAPSULE | Freq: Every day | ORAL | Status: DC

## 2014-03-01 MED ORDER — ACETAMINOPHEN 325 MG PO TABS: 650.0000 mg | ORAL_TABLET | Freq: Four times a day (QID) | ORAL | Status: AC | PRN

## 2014-03-01 MED ORDER — OXYCODONE HCL 5 MG PO TABS: 5.0000 mg | ORAL_TABLET | Freq: Two times a day (BID) | ORAL | Status: DC | PRN

## 2014-03-01 MED ORDER — ALPRAZOLAM 0.5 MG PO TABS: 0.5000 mg | ORAL_TABLET | Freq: Two times a day (BID) | ORAL | Status: AC | PRN

## 2014-03-01 MED ORDER — LIDOCAINE 5 % EX PTCH: 1.0000 | MEDICATED_PATCH | CUTANEOUS | Status: AC

## 2014-03-01 MED ORDER — ONDANSETRON HCL 4 MG PO TABS: 4.0000 mg | ORAL_TABLET | Freq: Four times a day (QID) | ORAL | Status: DC | PRN

## 2014-03-01 MED ORDER — DEXAMETHASONE 2 MG PO TABS: 2.0000 mg | ORAL_TABLET | Freq: Two times a day (BID) | ORAL | Status: AC

## 2014-03-01 NOTE — Progress Notes (Signed)
Pt. Has been discharged to [facility]. Report was called to facility. Pt. Is to be transported to facility by ambulance services.

## 2014-03-01 NOTE — Progress Notes (Signed)
CSW continuing to follow for disposition planning.  Per MD, pt medically ready for discharge today.  CSW met with pt at bedside. CSW introduced self. CSW discussed with pt regarding decision for SNF rehab. Pt stated that pt sister-in-law, Jo Yates was going to research the bed offers to make a decision. Pt provided CSW with permission to contact pt sister-in-law. CSW discussed with pt that MD anticipate that pt will discharge today after radiation treatment and provided support for pt surrounding this.  CSW contacted pt sister-in-law, Jo Yates via telephone. Pt sister-in-law discussed that she had researched the bed offers and agreeable to [facility]. Pt sister-in-law discussed that she is hopeful that facility will have a private room available for pt.   CSW contacted [facility]. Facility confirmed that bed available and private room available. Per [facility] the facility can accept pt today.  CSW discussed with pt at bedside. Pt friend and pt private caregiver at bedside and pt provided permission for CSW to speak in front of them. CSW discussed with pt about [facility] and that facility has private room available today. CSW shared with pt that MD feels that pt medically ready for discharge following radiation today and that facility will provide transportation to remainder of radiation treatments. Pt agreeable to plan to transition to [facility] today.   CSW notified MD.  CSW to facilitate pt discharge needs this afternoon.  Wyona Neils, MSW, LCSW Clinical Social Work [phone] 

## 2014-03-01 NOTE — Progress Notes (Signed)
Physical Therapy Treatment Patient Details Name: Stephanie Oliver MRN: [id number] DOB: June 04, 1951 Today's Date: 03/01/2014    History of Present Illness 63 yo female admitted with bil LE weakness, R hip/sacral area pain. Hx of NSCLC, diffuse osteopenia, mets to liver, lung, chemo induced neuropathy, ETOH abuse, R femur IM nail 06/22/13    PT Comments    Patient practiced standing at the [facility] which helped block her knees. Patient plans rehab at DC.  Follow Up Recommendations  SNF     Equipment Recommendations  Wheelchair cushion (measurements PT);Wheelchair (measurements PT)    Recommendations for Other Services       Precautions / Restrictions Precautions Precautions: Fall Precaution Comments: severe PN    Mobility  Bed Mobility Overal bed mobility: Needs Assistance Bed Mobility: Supine to Sit;Sit to Supine   Sidelying to sit: Mod assist;+2 for physical assistance;+2 for safety/equipment     Sit to sidelying: Mod assist;+2 for physical assistance;+2 for safety/equipment General bed mobility comments: assist for trunk and bil LEs. Utilized bedpad for positioning, scooting. Increased time.   Transfers Overall transfer level: Needs assistance Equipment used:  (STEDY) Transfers: Sit to/from Stand Sit to Stand: Max assist;+2 physical assistance;+2 safety/equipment;From elevated surface         General transfer comment: bed raised , lfeet plaaced in optimum position, decreased weight on L leg. Stood x 5 at stedy with 2 persons, last trial, patient had more difficulty.  Ambulation/Gait                 Stairs            Wheelchair Mobility    Modified Rankin (Stroke Patients Only)       Balance   Sitting-balance support: Bilateral upper extremity supported;Feet supported Sitting balance-Leahy Scale: Fair                              Cognition Arousal/Alertness: Awake/alert                          Exercises      General  Comments        Pertinent Vitals/Pain Pain Score: 5  Pain Location: back Pain Descriptors / Indicators: Aching;Constant Pain Intervention(s): Monitored during session;Limited activity within patient's tolerance    Home Living                      Prior Function            PT Goals (current goals can now be found in the care plan section) Progress towards PT goals: Progressing toward goals    Frequency  Min 3X/week    PT Plan Discharge plan needs to be updated    Co-evaluation             End of Session Equipment Utilized During Treatment: Gait belt Activity Tolerance: Patient tolerated treatment well Patient left: in bed;with call bell/phone within reach     Time: 1134-1205 PT Time Calculation (min): 31 min  Charges:  $Therapeutic Activity: 23-37 mins                    G Codes:      Isamar, Nazir 03/01/2014, 12:47 PM Tresa Endo PT [phone]

## 2014-03-01 NOTE — Progress Notes (Signed)
Pt for discharge to [facility].  CSW facilitated pt discharge needs including contacting facility, faxing pt discharge information via [facility], discussing with pt at bedside and pt sister-in-law, Denice Paradise via telephone, providing RN phone number to call report, and arranging ambulance transport for pt to [facility].   Pt coping appropriately with transition to [facility]. Pt made pt daughter aware via telephone and was appreciative of CSW making pt sister-in-law aware via telephone. Pt eager to transition to rehab to work with therapy in hopes of being able to move around more.   No further social work needs identified at this time.  CSW signing off.   Alison Murray, MSW, [place] Work [phone]

## 2014-03-01 NOTE — Progress Notes (Signed)
Patient BD:[id number] Schoenfeld      DOB: [birth year]      MEQ:[id number]   Palliative Medicine Team at [facility] Progress Note    Subjective: Vanice remains hopeful to have chemo despite verbalizing that this will not be curative.  She tolerated radiation and feels that her pain is doing some better. She was noted to be able to straighten her right leg today.  She continues to express her wish to live for her daughter and grandson despite the knowledge that she can't take care of herself at home.     Filed Vitals:   02/28/14 2119  BP: 119/65  Pulse: 84  Temp: 97.7 F (36.5 C)  Resp: 16   Physical exam:  Emotional support only     Assessment and plan: Likely DC to SNF today to continue radiation treatments.  Not sure about further chemo as she doesn't this that the neuropathy is worth the limited outcome but she vacillates between hopeful and realistic.  Emotional support offered.  Continue current pain regime.   Keiandre Cygan L. Lovena Le, MD MBA The Palliative Medicine Team at [facility] Phone: [phone] Pager: [phone] ( Use team phone after hours)

## 2014-03-01 NOTE — Progress Notes (Signed)
ANTICOAGULATION CONSULT NOTE - Follow up  Pharmacy Consult for apixaban Indication: atrial fibrillation  Allergies  Allergen Reactions  . Paxil [Paroxetine] Diarrhea, Nausea Only and Other (See Comments)    Also dizziness  . Lisinopril Diarrhea and Nausea Only  . Penicillins Hives    Patient Measurements: Height: 5\' 3"  (160 cm) Weight: 114 lb 3.2 oz (51.8 kg) IBW/kg (Calculated) : 52.4  Vital Signs: Temp: 97.7 F (36.5 C) (09/14 2119) Temp src: Oral (09/14 2119) BP: 119/65 mmHg (09/14 2119) Pulse Rate: 84 (09/14 2119)  Labs:  Recent Labs  02/27/14 0521  HGB 9.8*  HCT 31.7*  PLT 293  CREATININE 0.48*    Estimated Creatinine Clearance: 58.1 ml/min (by C-G formula based on Cr of 0.48).   Medical History: Past Medical History  Diagnosis Date  . Depression   . Panic attacks   . Hypertension   . Smoker   . Alcohol abuse   . History of radiation therapy 07/28/13-08/25/13    rt lung,37.5Gy/83fx  . Cancer     lung ca    Medications:  Scheduled:  . albuterol  2.5 mg Nebulization Daily  . amiodarone  200 mg Oral Daily  . apixaban  5 mg Oral BID  . cholecalciferol  1,000 Units Oral Daily  . dexamethasone  2 mg Oral Q12H  . digoxin  0.125 mg Oral Daily  . diltiazem  180 mg Oral Daily  . diphenhydrAMINE  25 mg Oral QHS  . folic acid  1 mg Oral Daily  . iron polysaccharides  150 mg Oral Daily  . ketorolac  15 mg Intravenous 3 times per day  . lactose free nutrition  237 mL Oral BID BM  . levothyroxine  25 mcg Oral QAC breakfast  . lidocaine  1 patch Transdermal Q24H  . pantoprazole  40 mg Oral Daily  . polyethylene glycol  17 g Oral Daily  . potassium chloride SA  20 mEq Oral Daily  . pregabalin  25 mg Oral QHS  . thiamine  100 mg Oral Daily    Assessment: 73 yoF admitted 9/8 with LE weakness, this is a chronic problem worsened by peripheral neuropathy secondary to chemotherapy for metastatic NSCLC. Pt on apixaban 5mg  PO BID for paroxysmal atrial  fibrillation. Pharmacy has been consulted to continue this on admission  Wt=51kg (<60kg) Age=64 (<80 yo) SCr=0.5, baseline 0.7 (<1.5) 5mg  BID dose remains appropriate (only meets 1/3 requirements [weight] to decrease dose) Admit Hgb 10.3  9/15: Last CBC and SCr were stable on 9/13. All doses charted. No bleeding issues noted. Discharge planning. Palliative care involved.   Goal of Therapy:  apixaban dosed based on patient weight and renal function.   Plan:   Continue home dose of apixaban 5mg  po bid.  Consider checking Bmet and CBC again soon.  Romeo Rabon, PharmD, pager [phone]. 03/01/2014,8:29 AM.

## 2014-03-01 NOTE — Discharge Instructions (Signed)
Bone Metastases Cancerous growths can begin in any part of the body. The original site of cancer is called the primary tumor or primary cancer (for example, breast cancer). After cancer has developed in one area of the body, cancerous cells from that area can break away and travel through the body's bloodstream. If these cancerous cells begin growing in another place in the body, they are called metastases. Bone metastases are cancer cells that have spread to the bone (which is different from a cancer that starts in the bone). These secondary growths are like the original tumor. For example, if a prostate cancer spreads to bone it is called metastatic prostate cancer, or prostate cancer metastatic to bone, but not bone cancer. Cancers can spread to almost any bone; the spine and pelvis are often involved.  Any type of cancer can spread to the bone, but the most common are breast, lung, kidney, thyroid and prostate cancers. Sometimes the primary tumor is not discovered until there are bone problems. If the primary cancer location cannot be discovered, the cancer is called cancer of unknown primary location. SYMPTOMS  Pain in the bones is the main symptom of bone metastases. Some other problems may occur first including:  Decreased appetite.  Nausea.  Muscle weakness.  Confusion.  Unusual sleep patterns due to discomfort.  Overly tired (fatigue).  Restlessness. Frail or brittle bones may lead to broken bones (fractures) that lead to learning what is wrong (diagnosis). A tumor often weakens the bones.  DIAGNOSIS  Metastatic cancers may be found months or years after or at the same time as the primary tumor. When a second tumor is found in a patient who has been treated for cancer, it is more often a metastasis than another primary tumor.  The patient's symptoms, physical examination, X-rays and blood tests may suggest a bone metastases. In addition, an examination of tissue or a cell sample  (biopsy) is usually done to find the cancer. This sample is removed with a needle. This tissue sample must be looked at under a microscope to confirm a diagnosis. TREATMENT  Options generally include treatments that give relief from symptoms (palliative) or curative. Those with advanced, metastasized cancer may receive treatment focused on pain relief and prolonging life. These treatments depend on the type of cancer and its location.  Treatment for cancer depends on its type and location. Some of these treatments are:  Surgery to remove the original tumor and/or to remove parts of the body that produce hormones and other chemicals that make cancer worse.  Treatment with drugs (chemotherapy).  Bone marrow transplantations on rare occasions.  Radiation therapy (radiotherapy).  Hormonal therapy.  Pain relieving medications. Your caregiver will help you understand the likelihood that any particular treatment will be helpful for you. While some treatments aim to cure or control the cancer, others give relief from symptoms only. If you have bone metastases, radiation therapy may be recommended to treat pain (if it is in one main location). Pain medications are available. These include strong medicines like morphine. You may be instructed to take a long-acting pain medication (to control most of your pain) and a short-acting medication to control occasional flares of pain. Pain medication is sometimes also given continuously through a pump. HOME CARE INSTRUCTIONS   Take medications exactly as prescribed.  Keep any follow-up appointments.  Pain medications can make you sleepy or confused. Do not drive, climb ladders, or do other dangerous activities while on pain medication.  Pain medications often  cause constipation. Ask your caregiver for information on stool softeners.  Do not share your pain medication with others. SEEK MEDICAL CARE IF:   Your bone pain is not controlled.  You are having  problems or side effects from your medication.  You have excessive sleepiness or confusion. SEEK IMMEDIATE MEDICAL CARE IF:   You fall and have any injury or pain from the fall.  You have trouble walking.  You have numbness or tingling in your legs.  You develop a sudden significant worsening of your pain. Document Released: 05/24/2002 Document Revised: 08/26/2011 Document Reviewed: 01/15/2008 [facility] Patient Information 2015 [place], [place]. This information is not intended to replace advice given to you by your health care provider. Make sure you discuss any questions you have with your health care provider.

## 2014-03-01 NOTE — Discharge Summary (Addendum)
Physician Discharge Summary  Stephanie Oliver OBS:[id number] DOB: Dec 01, 1950 DOA: 02/22/2014  PCP: [name], MD  Admit date: 02/22/2014 Discharge date: 03/01/2014   Recommendations for Outpatient Follow-Up:   1. The patient will followup with Dr. Julien Nordmann after she completes her radiation therapy for further treatment considerations. 2. Consider outpatient bisphosphonate therapy for osteoporosis. 3. Repeat TSH in 4 weeks to ensure WNL.   Discharge Diagnosis:   Principal Problem:    Lower extremity weakness secondary to sacral insufficiency fracture from metastatic cancer and severe spinal stenosis Active Problems:    Intertrochanteric fracture of right hip    Hypertension    Osteoporosis    Bipolar 1 disorder    Mediastinal lymphadenopathy    PAF (paroxysmal atrial fibrillation)    Non-small cell cancer of right lung    Sacral insufficiency fracture    Foot drop    Peripheral neuropathy    Protein-calorie malnutrition, severe    Family dynamics problem   Discharge Condition: Improved.  Diet recommendation: Regular.   History of Present Illness:   Stephanie Oliver is an 63 y.o. female with a PMH of metastatic non-small cell lung cancer status post palliative chemotherapy and radiation therapy, depression, hypertension and osteoporosis was referred to the hospital by her oncologist for evaluation of progressive weakness. The patient clearly cannot adequately care for herself and does not have consistent 24-hour supervision in the home. She cannot bear weight secondary significant lower extremity weakness. Upon initial evaluation in the ED, the patient was noted to have significant lower extremity weakness and a CT scan of her abdomen and pelvis revealed possible sacral insufficiency fracture versus metastatic involvement of the sacrum. Urinalysis was negative for signs of infection.   Hospital Course by Problem:   Principal Problem:  Lower extremity weakness,  multifactorial with CT findings of sacral insufficiency fracture versus metastasis to the sacrum / fracture of screw in distal femur / severe osteoarthritis of the right hip / peripheral neuropathy all contributory   Seen by orthopedics 02/24/14, has a intramedullary rod screw fracture suggestive of hardware loosening of the right femur, which is where she is having her pain. Fracture felt to be healed with broken interlock screw occurring prior to union, no further intervention needed per orthopedic M.D.   CT head negative for brain metastasis.   MRI of the lumbosacral spine consistent with sacral metastasis and severe spinal stenosis secondary to disc degeneration throughout the lumbar spine. Completed a prednisone dose pack. Seen by radiation oncologist 02/25/14 with plans to proceed with 20 grays of radiation therapy delivered in 5 fractions to the right sacrum. S/P 2 treatments.   Known peripheral neuropathy with foot drop from history of treatment with Taxol likely contributory.Continue Lyrica.   PT/OT evaluations performed, for tx to rehabilitation.   Palliative care meds with patient 02/23/14 to discuss goals of care are given poor prognosis secondary to underlying metastatic lung cancer. Continues to want full CODE STATUS and further treatment of her cancer if indicated. Palliative care assisted with pain management, with improvement of patient's complaints of pain at discharge.  Active Problems: Paroxysmal atrial fibrillation  Patient is maintained on therapeutic anticoagulation. Heart rate control.   Family dynamics problem   Addressed. Safe discharge plan in place.   Hypokalemia   Supplemented.  UTI   Many bacteria and leukocytes noted on urinalysis. Urine culture on grew 50,000 colonies of Escherichia coli. Status post empiric Cipro for 3 days of treatment.   Presyncope / hypotension / history of hypertension  Patient was presyncopal prior to being returned to the bed after  being mobilized to the bedside commode.   Fecal occult blood negative.   Hypotension thought to be orthostatic, given 500 cc bolus of normal saline no further episodes.   Hold antihypertensives with parameters. Lasix D/C'd 02/23/14. BP improved.   Intertrochanteric fracture of right hip   Status post intra medullary and dynamic screw fixation of the right femur and femoral neck 06/22/13.   Radiographs show fracture of the screw with lucency around the screw suggesting hardware loosening.   Orthopedics evaluation done 02/24/14, fracture is healed.   Severe protein calorie malnutrition   Patient appears cachectic.   Pt meets criteria for severe MALNUTRITION in the context of chronic illness as evidenced by PO intake < 75% for > one month, 15% body weight loss in 6 months, severe muscle wasting and subcutaneous fat loss.   Continue supplements as recommended.  Osteoporosis   Consider outpatient treatment with a bisphosphonate.  Bipolar 1 disorder   Not currently on any mood stabilizing medications. Continue Xanax as needed.  PAF (paroxysmal atrial fibrillation)   On chronic Eliquis.   Continue amiodarone, digoxin and Cardizem as blood pressure tolerates.  Non-small cell cancer of right lung with mediastinal lymphadenopathy   Status post palliative chemotherapy/radiation therapy with plans to repeat CT scans in 2 months for consideration of second line treatment.   Stable disease noted in chest, but progression of disease noted in liver. LFTs stable.   Dr. Julien Nordmann will followup with her once she decides on whether or not she wants to proceed with palliative chemotherapy, the patient wants to see how she does with radiation before she makes a decision.  Foot drop / Peripheral neuropathy   Secondary history of therapy with Taxol. Continue Lyrica..  Hypothyroidism   Continue Synthroid. TSH elevated at 10.460. Free T4 WNL, would not adjust Synthroid further. Recheck TSH in 4  weeks.  Normocytic anemia   Continue iron supplement and folate.   Serum B12/folate WNL. Stools Hemoccult negative.   Hemoglobin stable.  Medical Consultants:    Dr. Edmonia Lynch, Orthopedics   Dr. Billey Chang, Palliative care  Dr. Marye Round, Radiation Oncology  Discharge Exam:   Filed Vitals:   02/28/14 2119  BP: 119/65  Pulse: 84  Temp: 97.7 F (36.5 C)  Resp: 16   Filed Vitals:   02/28/14 1031 02/28/14 1444 02/28/14 2119 03/01/14 0848  BP:  112/67 119/65   Pulse:  80 84   Temp:  97.8 F (36.6 C) 97.7 F (36.5 C)   TempSrc:  Oral Oral   Resp:  16 16   Height:      Weight:      SpO2: 97% 98% 97% 96%    Gen:  NAD, cachectic appearing Cardiovascular:  RRR, No M/R/G Respiratory: Lungs CTAB Gastrointestinal: Abdomen soft, NT/ND with normal active bowel sounds. Extremities: No C/E/C Neuro: BLE weakness w/ foot drop   The results of significant diagnostics from this hospitalization (including imaging, microbiology, ancillary and laboratory) are listed below for reference.     Procedures and Diagnostic Studies:   Dg Pelvis 1-2 Views 02/22/2014: 1. Diffuse severe osteopenia and degenerative changes lumbar spine and both hips. 2. Open reduction internal fixation right hip fracture. This appears stable. 3. No acute abnormality.   Dg Femur Right 02/22/2014: 1. The distal transfixing femoral intra medullary rod screw is fractured suggestive of hardware loosening. 2. Severe degenerative change of the right hip.   Ct  Chest W Contrast 02/22/2014: 1. Stable post treatment change of the medial aspect of the right upper lobe and right hilum, similar to the 01/07/2014 examination. No evidence of progressive metastatic disease within the chest. 2. Coronary artery calcifications.   Ct Abdomen Pelvis W Contrast 02/22/2014: 1. Progressive metastatic disease within the liver with dominant lesion within the left lobe of the liver now measuring 4.6 cm in diameter, previously,  2.8 cm. 2. Post intra medullary and dynamic screw fixation of the right femur and femoral neck, incompletely imaged and evaluated - further evaluation could be performed with dedicated radiographs of the right hip and/or femur as clinically indicated. 3. Severe degenerative change of the right hip. 4. Unchanged ill-defined area of linear sclerosis within the right sacral ala with differential considerations again including osseous metastatic disease versus is a sacral insufficiency fracture.   Ct Head W Wo Contrast 02/23/2014: Atrophy and chronic microvascular ischemia. Negative for metastatic disease.   Mr Lumbar Spine W Wo Contrast 02/24/2014: 1. Abnormal appearance of the right sacrum consistent with metastasis. The lesion extends into the right S1-2 neural foramen, affecting the right S1 nerve. There is likely an associated pathologic fracture. 2. Severe, diffuse disc degeneration throughout the lumbar spine as above, resulting in severe spinal stenosis at L2-3 and L4-5 and severe bilateral neural foraminal stenosis at L5-S1.    Labs:   Basic Metabolic Panel:  Recent Labs Lab 02/23/14 0415 02/24/14 0400 02/24/14 2202 02/26/14 0426 02/27/14 0521  NA 136* 134* 138 135* 141  K 3.2* 3.1* 4.7 4.8 4.9  CL 93* 95* 98 98 104  CO2 30 29 25 25 25   GLUCOSE 93 92 160* 147* 124*  BUN 9 9 7 12 21   CREATININE 0.50 0.58 0.49* 0.47* 0.48*  CALCIUM 9.6 9.2 9.9 9.3 8.9  MG  --  1.5  --   --   --    GFR Estimated Creatinine Clearance: 58.1 ml/min (by C-G formula based on Cr of 0.48). Liver Function Tests:  Recent Labs Lab 02/22/14 1531 02/23/14 0415  AST 25 19  ALT 15 13  ALKPHOS 117 104  BILITOT 0.4 0.4  PROT 7.4 6.6  ALBUMIN 3.3* 2.8*   Coagulation profile  Recent Labs Lab 02/23/14 0415  INR 1.16    CBC:  Recent Labs Lab 02/22/14 1440 02/23/14 0415 02/24/14 0400 02/27/14 0521  WBC 9.5 6.7 6.1 9.4  HGB 10.3* 9.7* 8.9* 9.8*  HCT 31.5* 28.8* 26.7* 31.7*  MCV 93.5 91.1 91.4  93.8  PLT 277 249 239 293   Microbiology Recent Results (from the past 240 hour(s))  URINE CULTURE     Status: None   Collection Time    02/22/14  5:16 PM      Result Value Ref Range Status   Specimen Description URINE, CATHETERIZED   Final   Special Requests NONE   Final   Culture  Setup Time     Final   Value: 02/23/2014 04:49     Performed at [facility]     Final   Value: 50,000 COLONIES/ML     Performed at [facility]   Culture     Final   Value: ESCHERICHIA COLI     Performed at [facility]   Report Status 02/25/2014 FINAL   Final   Organism ID, Bacteria ESCHERICHIA COLI   Final     Discharge Instructions:   Discharge Instructions   Call MD for:  persistant nausea and  vomiting    Complete by:  As directed      Call MD for:  severe uncontrolled pain    Complete by:  As directed      Call MD for:  temperature >100.4    Complete by:  As directed      Diet general    Complete by:  As directed      Discharge instructions    Complete by:  As directed   You were cared for by Dr. Jacquelynn Cree  (a hospitalist) during your hospital stay. If you have any questions about your discharge medications or the care you received while you were in the hospital after you are discharged, you can call the unit and ask to speak with the hospitalist on call if the hospitalist that took care of you is not available. Once you are discharged, your primary care physician will handle any further medical issues. Please note that NO REFILLS for any discharge medications will be authorized once you are discharged, as it is imperative that you return to your primary care physician (or establish a relationship with a primary care physician if you do not have one) for your aftercare needs so that they can reassess your need for medications and monitor your lab values.  Any outstanding tests can be reviewed by your PCP at your follow up visit.  It is also important  to review any medicine changes with your PCP.  Please bring these d/c instructions with you to your next visit so your physician can review these changes with you.     Increase activity slowly    Complete by:  As directed             Medication List    STOP taking these medications       furosemide 40 MG tablet  Commonly known as:  LASIX     potassium chloride SA 20 MEQ tablet  Commonly known as:  K-DUR,KLOR-CON      TAKE these medications       acetaminophen 325 MG tablet  Commonly known as:  TYLENOL  Take 2 tablets (650 mg total) by mouth every 6 (six) hours as needed for mild pain (or Fever >/= 101).     albuterol 108 (90 BASE) MCG/ACT inhaler  Commonly known as:  PROVENTIL HFA;VENTOLIN HFA  Inhale 2 puffs into the lungs 2 (two) times daily as needed.     ALPRAZolam 0.5 MG tablet  Commonly known as:  XANAX  Take 1 tablet (0.5 mg total) by mouth 2 (two) times daily as needed for anxiety.     amiodarone 200 MG tablet  Commonly known as:  PACERONE  Take 1 tablet (200 mg total) by mouth daily.     apixaban 5 MG Tabs tablet  Commonly known as:  ELIQUIS  Take 1 tablet (5 mg total) by mouth 2 (two) times daily.     CALTRATE 600 PO  Take 1 tablet by mouth daily.     cholecalciferol 1000 UNITS tablet  Commonly known as:  VITAMIN D  Take 1,000 Units by mouth daily.     dexamethasone 2 MG tablet  Commonly known as:  DECADRON  Take 1 tablet (2 mg total) by mouth every 12 (twelve) hours.     digoxin 0.125 MG tablet  Commonly known as:  LANOXIN  Take 1 tablet (0.125 mg total) by mouth daily.     diltiazem 180 MG 24 hr capsule  Commonly known as:  CARDIZEM  CD  Take 180 mg by mouth daily.     diphenhydrAMINE 25 MG tablet  Commonly known as:  BENADRYL  Take 25 mg by mouth at bedtime.     feeding supplement (ENSURE COMPLETE) Liqd  Take 237 mLs by mouth 2 (two) times daily between meals.     folic acid 1 MG tablet  Commonly known as:  FOLVITE  Take 1 tablet (1 mg  total) by mouth daily.     gabapentin 100 MG capsule  Commonly known as:  NEURONTIN  Take 1 capsule (100 mg total) by mouth 3 (three) times daily.     iron polysaccharides 150 MG capsule  Commonly known as:  NIFEREX  Take 1 capsule (150 mg total) by mouth daily.     levalbuterol 0.63 MG/3ML nebulizer solution  Commonly known as:  XOPENEX  Take 0.63 mg by nebulization every 4 (four) hours as needed for wheezing or shortness of breath.     levothyroxine 25 MCG tablet  Commonly known as:  SYNTHROID, LEVOTHROID  Take 25 mcg by mouth daily before breakfast.     lidocaine 5 %  Commonly known as:  LIDODERM  Place 1 patch onto the skin daily. Remove & Discard patch within 12 hours or as directed by MD     multivitamin with minerals Tabs tablet  Take 3 tablets by mouth daily.     ondansetron 4 MG tablet  Commonly known as:  ZOFRAN  Take 1 tablet (4 mg total) by mouth every 6 (six) hours as needed for nausea.     oxyCODONE 5 MG immediate release tablet  Commonly known as:  Oxy IR/ROXICODONE  Take 1 tablet (5 mg total) by mouth 2 (two) times daily as needed for severe pain.     pantoprazole 40 MG tablet  Commonly known as:  PROTONIX  Take 40 mg by mouth daily.     polyethylene glycol packet  Commonly known as:  MIRALAX / GLYCOLAX  Take 17 g by mouth daily.     pregabalin 25 MG capsule  Commonly known as:  LYRICA  Take 1 capsule (25 mg total) by mouth at bedtime.     thiamine 100 MG tablet  Take 1 tablet (100 mg total) by mouth daily.           Follow-up Information   Schedule an appointment as soon as possible for a visit with [name], MD. (If symptoms worsen)    Specialty:  Family Medicine   Contact information:   [address] [place] [address] [phone]       Follow up with Eilleen Kempf., MD. Schedule an appointment as soon as possible for a visit in 2 weeks. (For further treatment recommendations)    Specialty:  Oncology     Contact information:   [place] [address] [phone]        Time coordinating discharge: 40 minutes.  Signed:  RAMA,CHRISTINA  Pager [phone] [facility] 03/01/2014, 11:50 AM

## 2014-03-02 ENCOUNTER — Telehealth: Payer: Self-pay | Admitting: *Deleted

## 2014-03-02 ENCOUNTER — Non-Acute Institutional Stay (SKILLED_NURSING_FACILITY): Admitting: Internal Medicine

## 2014-03-02 ENCOUNTER — Ambulatory Visit
Admit: 2014-03-02 | Discharge: 2014-03-02 | Disposition: A | Discharge status: Home / Self Care | Attending: Radiation Oncology | Admitting: Radiation Oncology

## 2014-03-02 ENCOUNTER — Encounter: Payer: Self-pay | Admitting: Internal Medicine

## 2014-03-02 DIAGNOSIS — G609 Hereditary and idiopathic neuropathy, unspecified: Secondary | ICD-10-CM

## 2014-03-02 DIAGNOSIS — J438 Other emphysema: Secondary | ICD-10-CM | POA: Diagnosis not present

## 2014-03-02 DIAGNOSIS — M810 Age-related osteoporosis without current pathological fracture: Secondary | ICD-10-CM | POA: Diagnosis not present

## 2014-03-02 DIAGNOSIS — F319 Bipolar disorder, unspecified: Secondary | ICD-10-CM | POA: Diagnosis not present

## 2014-03-02 DIAGNOSIS — C3490 Malignant neoplasm of unspecified part of unspecified bronchus or lung: Secondary | ICD-10-CM | POA: Diagnosis not present

## 2014-03-02 DIAGNOSIS — G629 Polyneuropathy, unspecified: Secondary | ICD-10-CM

## 2014-03-02 DIAGNOSIS — Z510 Encounter for antineoplastic radiation therapy: Secondary | ICD-10-CM | POA: Diagnosis present

## 2014-03-02 DIAGNOSIS — E43 Unspecified severe protein-calorie malnutrition: Secondary | ICD-10-CM

## 2014-03-02 DIAGNOSIS — C3491 Malignant neoplasm of unspecified part of right bronchus or lung: Secondary | ICD-10-CM

## 2014-03-02 DIAGNOSIS — M8448XG Pathological fracture, other site, subsequent encounter for fracture with delayed healing: Secondary | ICD-10-CM

## 2014-03-02 DIAGNOSIS — C801 Malignant (primary) neoplasm, unspecified: Secondary | ICD-10-CM | POA: Diagnosis not present

## 2014-03-02 DIAGNOSIS — J439 Emphysema, unspecified: Secondary | ICD-10-CM

## 2014-03-02 DIAGNOSIS — M8448XD Pathological fracture, other site, subsequent encounter for fracture with routine healing: Secondary | ICD-10-CM | POA: Diagnosis not present

## 2014-03-02 DIAGNOSIS — Z79899 Other long term (current) drug therapy: Secondary | ICD-10-CM | POA: Diagnosis not present

## 2014-03-02 NOTE — Telephone Encounter (Signed)
Called [facility],[place] [phone] ,spoke with Joy, and asked that patient be brought to cancer center for her radiation treatment at 2pm, saw that she was d/c from the hospital yesterday, and I will make sure they have a calender for the rest of this week as the patient completes this Friday, per Joy:"Our [facility] is down and we should  Receive 24 hour notice when a patient  needs transportation but I will see what we can do,", "the staff comes in at 0830am, and I will see if we got instructions from the hospital"< did inform her that the social worker at inpatient status should have arranged this, Caryl Asp will call me back after 9 to see if they can bring the patient today"thanked her will inform Dr.Moody and The radiation therapists 8:03 AM

## 2014-03-02 NOTE — Progress Notes (Signed)
Patient ID: Stephanie Oliver, female   DOB: 08-09-1950, 63 y.o.   MRN: [id number]  Provider:  Rexene Edison. Mariea Clonts, D.O., C.M.D. Location:  [facility]  PCP: [name], MD  Code Status: full code  Allergies  Allergen Reactions  . Paxil [Paroxetine] Diarrhea, Nausea Only and Other (See Comments)    Also dizziness  . Lisinopril Diarrhea and Nausea Only  . Penicillins Hives    Chief Complaint  Patient presents with  . New Admit To SNF    for "rehab" s/p hospitalization for lower extremity weakness secondary to sacral insufficiency fracture from metastatic cancer and severe spinal stenosis and neuropathy from taxol chemo    HPI: 63 y.o. 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  She is now getting palliative XRT.  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  She already had additional palliative chemo and xRT.  During her stay, she had hypotension so lasix was stopped.  She did not have consistent caregivers at home 24x7 and needs adl help so that is why she is here and for therapy to get stronger.  She has had two XRT treatments to her sacrum at this point.  She had fx in the screw of the distal femur also.  She is to have a f/u CT in 2 mos to consider more palliative chemo with Dr. Julien Nordmann.     ROS: Review of Systems  Constitutional: Positive for weight loss and malaise/fatigue.       Poor appetite and intake  HENT: Negative for congestion.   Respiratory: Negative for shortness of breath.   Cardiovascular: Negative for chest pain.  Gastrointestinal: Negative for abdominal pain and constipation.  Genitourinary:  Negative for dysuria.  Musculoskeletal: Positive for back pain and joint pain.       Buttock pain  Neurological: Positive for weakness. Negative for dizziness.  Psychiatric/Behavioral: Positive for depression. Negative for memory loss. The patient is nervous/anxious and has insomnia.      Past Medical History  Diagnosis Date  . Depression   . Panic attacks   . Hypertension   . Smoker   . Alcohol abuse   . History of radiation therapy 07/28/13-08/25/13    rt lung,37.5Gy/35fx  . Primary cancer of right lung   . History of fall 03/15/2014    [place] restroom   Past Surgical History  Procedure Laterality Date  . Tonsillectomy    . Back surgery    . Femur im nail Right 06/22/2013    Procedure: RIGHT HIP GAMMA NAIL FIXATION   ;  Surgeon: Renette Butters, MD;  Location: [place] [facility];  Service: Orthopedics;  Laterality: Right;  . Video bronchoscopy with endobronchial ultrasound N/A 06/22/2013    Procedure: VIDEO BRONCHOSCOPY WITH ENDOBRONCHIAL ULTRASOUND;  Surgeon: Doree Fudge, MD;  Location: [place];  Service: Pulmonary;  Laterality: N/A;   Social History:   reports that she quit smoking about 12 months ago. Her smoking use included Cigarettes. She smoked 1.50 packs per day. She has never used smokeless tobacco. She reports that she drinks about 2.4 oz of alcohol per week. She reports that she does not use illicit drugs.  Family History  Problem Relation Age  of Onset  . CAD Father   . Arthritis Mother     Medications: Patient's Medications  New Prescriptions   No medications on file  Previous Medications   ACETAMINOPHEN (TYLENOL) 325 MG TABLET    Take 2 tablets (650 mg total) by mouth every 6 (six) hours as needed for mild pain (or Fever >/= 101).   ALBUTEROL (PROVENTIL HFA;VENTOLIN HFA) 108 (90 BASE) MCG/ACT INHALER    Inhale 2 puffs into the lungs 2 (two) times daily as needed.    ALBUTEROL (PROVENTIL) (2.5 MG/3ML) 0.083% NEBULIZER SOLUTION    Take 2.5 mg by nebulization daily  after breakfast.   ALPRAZOLAM (XANAX) 0.5 MG TABLET    Take 1 tablet (0.5 mg total) by mouth 2 (two) times daily as needed for anxiety.   AMIODARONE (PACERONE) 200 MG TABLET    Take 1 tablet (200 mg total) by mouth daily.   APIXABAN (ELIQUIS) 5 MG TABS TABLET    Take 1 tablet (5 mg total) by mouth 2 (two) times daily.   CALCIUM CARBONATE (CALTRATE 600 PO)    Take 1 tablet by mouth daily.   CHOLECALCIFEROL (VITAMIN D) 1000 UNITS TABLET    Take 1,000 Units by mouth daily.   DEXAMETHASONE (DECADRON) 2 MG TABLET    Take 1 tablet (2 mg total) by mouth every 12 (twelve) hours.   DIGOXIN (LANOXIN) 0.125 MG TABLET    Take 1 tablet (0.125 mg total) by mouth daily.   DILTIAZEM (DILACOR XR) 180 MG 24 HR CAPSULE    Take 180 mg by mouth daily.   DIPHENHYDRAMINE (BENADRYL) 25 MG TABLET    Take 25 mg by mouth at bedtime.   FEEDING SUPPLEMENT, ENSURE COMPLETE, (ENSURE COMPLETE) LIQD    Take 237 mLs by mouth 2 (two) times daily between meals.   FOLIC ACID (FOLVITE) 1 MG TABLET    Take 1 tablet (1 mg total) by mouth daily.   GABAPENTIN (NEURONTIN) 100 MG CAPSULE    Take 1 capsule (100 mg total) by mouth 3 (three) times daily.   IRON POLYSACCHARIDES (NIFEREX) 150 MG CAPSULE    Take 1 capsule (150 mg total) by mouth daily.   LEVOTHYROXINE (SYNTHROID, LEVOTHROID) 25 MCG TABLET    Take 25 mcg by mouth daily before breakfast.   LIDOCAINE (LIDODERM) 5 %    Place 1 patch onto the skin daily. Remove & Discard patch within 12 hours or as directed by MD   MULTIPLE VITAMIN (MULTIVITAMIN WITH MINERALS) TABS TABLET    Take 3 tablets by mouth daily.   PANTOPRAZOLE (PROTONIX) 40 MG TABLET    Take 40 mg by mouth daily.   POLYETHYLENE GLYCOL (MIRALAX / GLYCOLAX) PACKET    Take 17 g by mouth daily.   THIAMINE 100 MG TABLET    Take 1 tablet (100 mg total) by mouth daily.  Modified Medications   Modified Medication Previous Medication   OXYCODONE (OXY IR/ROXICODONE) 5 MG IMMEDIATE RELEASE TABLET oxyCODONE (OXY IR/ROXICODONE) 5 MG  immediate release tablet      Take one tablet by mouth every 6 hours as needed for pain    Take 1 tablet (5 mg total) by mouth 2 (two) times daily as needed for severe pain.   PREGABALIN (LYRICA) 25 MG CAPSULE pregabalin (LYRICA) 25 MG capsule      Take 1 capsule (25 mg total) by mouth at bedtime.    Take 1 capsule (25 mg total) by mouth at bedtime.  Discontinued Medications   DILTIAZEM (CARDIZEM  CD) 180 MG 24 HR CAPSULE    Take 180 mg by mouth daily.   LEVALBUTEROL (XOPENEX) 0.63 MG/3ML NEBULIZER SOLUTION    Take 0.63 mg by nebulization every 4 (four) hours as needed for wheezing or shortness of breath.   ONDANSETRON (ZOFRAN) 4 MG TABLET    Take 1 tablet (4 mg total) by mouth every 6 (six) hours as needed for nausea.   PREGABALIN (LYRICA) 25 MG CAPSULE    Take 1 capsule (25 mg total) by mouth at bedtime.     Physical Exam: Filed Vitals:   03/02/14 1245  Height: 5\' 3"  (1.6 m)  Weight: 114 lb (51.71 kg)  rest of vitals not in snf computer system  Physical Exam  Constitutional: She is oriented to person, place, and time.  Cachectic female resting in bed  HENT:  Head: Normocephalic and atraumatic.  Eyes: EOM are normal. Pupils are equal, round, and reactive to light.  Neck: Neck supple. No JVD present.  Cardiovascular: Normal rate, regular rhythm, normal heart sounds and intact distal pulses.   Pulmonary/Chest: Effort normal.  Some rhonchi present right greater than left  Abdominal: There is tenderness.  RUQ  Musculoskeletal: She exhibits no edema.  Sacral and hip tenderness  Neurological: She is alert and oriented to person, place, and time.  Skin: Skin is warm and dry.  Psychiatric:  Angry and frustrated     Labs reviewed: Basic Metabolic Panel:  Recent Labs  02/24/14 0400  02/26/14 0426 02/27/14 0521 03/15/14 1100 05-Apr-2014 0655  NA 134*  < > 135* 141 131* 138  K 3.1*  < > 4.8 4.9 4.4 5.0  CL 95*  < > 98 104  --  95*  CO2 29  < > 25 25 33* 37*  GLUCOSE 92  < > 147*  124* 121 123*  BUN 9  < > 12 21 24.2 33*  CREATININE 0.58  < > 0.47* 0.48* 0.5* 0.21*  CALCIUM 9.2  < > 9.3 8.9 8.5 8.0*  MG 1.5  --   --   --   --   --   < > = values in this interval not displayed. Liver Function Tests:  Recent Labs  02/23/14 0415 03/15/14 1100 04/05/14 0655  AST 19 23 22   ALT 13 57* 50*  ALKPHOS 104 79 68  BILITOT 0.4 0.23 0.2*  PROT 6.6 5.7* 5.9*  ALBUMIN 2.8* 2.5* 2.5*  CBC:  Recent Labs  01/11/14 0952  02/27/14 0521 03/15/14 1100 04-05-14 0655  WBC 7.3  < > 9.4 10.8* 6.9  NEUTROABS 5.5  --   --  9.7* 6.0  HGB 9.4*  < > 9.8* 10.9* 10.9*  HCT 28.6*  < > 31.7* 33.7* 35.5*  MCV 99.3  < > 93.8 95.1 100.3*  PLT 127*  < > 293 172 115*  < > = values in this interval not displayed. Cardiac Enzymes:  Recent Labs  04/05/2014 0655  TROPONINI <0.30  CBG:  Recent Labs  08/04/13 1056  GLUCAP 91    Imaging and Procedures: Dg Pelvis 1-2 Views 02/22/2014: 1. Diffuse severe osteopenia and degenerative changes lumbar spine and both hips. 2. Open reduction internal fixation right hip fracture. This appears stable. 3. No acute abnormality.   Dg Femur Right 02/22/2014: 1. The distal transfixing femoral intra medullary rod screw is fractured suggestive of hardware loosening. 2. Severe degenerative change of the right hip.   Ct Chest W Contrast 02/22/2014: 1. Stable post treatment change of the medial  aspect of the right upper lobe and right hilum, similar to the 01/07/2014 examination. No evidence of progressive metastatic disease within the chest. 2. Coronary artery calcifications.   Ct Abdomen Pelvis W Contrast 02/22/2014: 1. Progressive metastatic disease within the liver with dominant lesion within the left lobe of the liver now measuring 4.6 cm in diameter, previously, 2.8 cm. 2. Post intra medullary and dynamic screw fixation of the right femur and femoral neck, incompletely imaged and evaluated - further evaluation could be performed with dedicated radiographs of  the right hip and/or femur as clinically indicated. 3. Severe degenerative change of the right hip. 4. Unchanged ill-defined area of linear sclerosis within the right sacral ala with differential considerations again including osseous metastatic disease versus is a sacral insufficiency fracture.   Ct Head W Wo Contrast 02/23/2014: Atrophy and chronic microvascular ischemia. Negative for metastatic disease.   Mr Lumbar Spine W Wo Contrast 02/24/2014: 1. Abnormal appearance of the right sacrum consistent with metastasis. The lesion extends into the right S1-2 neural foramen, affecting the right S1 nerve. There is likely an associated pathologic fracture. 2. Severe, diffuse disc degeneration throughout the lumbar spine as above, resulting in severe spinal stenosis at L2-3 and L4-5 and severe bilateral neural foraminal stenosis at L5-S1.   Assessment/Plan 1. Non-small cell cancer of right lung -with mets to bone and liver -receiving palliative XRT -already had two of the treatments for the sacral fx -previously had other palliative chemo and xrt and has f/u CT and appt with Dr. Julien Nordmann -she is not ready for hospice care, but only palliative treatments can be offered at this point due to advanced stage of disease -was also drinking and smoking prior to admission  2. Pulmonary emphysema, unspecified emphysema type -cont current therapy as per pulmonary  3. Peripheral neuropathy -due to her chemo and alcohol abuse -cont neurontin and folic acid and thiamine  4. Protein-calorie malnutrition, severe -cont supplements, encouraging po intake, is on decadron which should help appetite also along with pain   5. Bipolar 1 disorder -continues on xanax only for her mood at this time -would benefit from [place] seeing her  6. Sacral insufficiency fracture, with delayed healing, subsequent encounter -cont pain mgt with oxycodone, palliative xrt, decadron -here for therapy, but appears too ill to make great  progress -my recommendation is palliative care services and d/c therapy, but she wants to continue   7. Osteoporosis -cont ca with vitamin D, start bisphosphonate--may help pain even at this point--sometimes reclast can help pain in bone mets  Functional status: dependent in adls except feeding and needs encouragement  Family/ staff Communication: discussed with nursing and therapy

## 2014-03-03 ENCOUNTER — Other Ambulatory Visit: Payer: Self-pay | Admitting: *Deleted

## 2014-03-03 ENCOUNTER — Telehealth: Payer: Self-pay | Admitting: Internal Medicine

## 2014-03-03 ENCOUNTER — Ambulatory Visit
Admit: 2014-03-03 | Discharge: 2014-03-03 | Disposition: A | Discharge status: Home / Self Care | Attending: Radiation Oncology | Admitting: Radiation Oncology

## 2014-03-03 DIAGNOSIS — Z510 Encounter for antineoplastic radiation therapy: Secondary | ICD-10-CM | POA: Diagnosis not present

## 2014-03-03 MED ORDER — PREGABALIN 25 MG PO CAPS: 25.0000 mg | ORAL_CAPSULE | Freq: Every day | ORAL | Status: DC

## 2014-03-03 MED ORDER — OXYCODONE HCL 5 MG PO TABS: ORAL_TABLET | ORAL | Status: AC

## 2014-03-03 MED ORDER — PREGABALIN 25 MG PO CAPS: 25.0000 mg | ORAL_CAPSULE | Freq: Every day | ORAL | Status: AC

## 2014-03-03 NOTE — Telephone Encounter (Signed)
returned pt call and sw. pt to confirm appt....pt ok and aware

## 2014-03-03 NOTE — Telephone Encounter (Signed)
[facility] 

## 2014-03-04 ENCOUNTER — Encounter: Payer: Self-pay | Admitting: Radiation Oncology

## 2014-03-04 ENCOUNTER — Ambulatory Visit
Admit: 2014-03-04 | Discharge: 2014-03-04 | Disposition: A | Discharge status: Home / Self Care | Attending: Radiation Oncology | Admitting: Radiation Oncology

## 2014-03-04 VITALS — BP 135/75 | HR 96 | Temp 97.8°F | Resp 20

## 2014-03-04 DIAGNOSIS — Z510 Encounter for antineoplastic radiation therapy: Secondary | ICD-10-CM | POA: Diagnosis not present

## 2014-03-04 DIAGNOSIS — C3410 Malignant neoplasm of upper lobe, unspecified bronchus or lung: Secondary | ICD-10-CM

## 2014-03-04 NOTE — Progress Notes (Signed)
Department of Radiation Oncology  Phone:  [phone] Fax:        [phone]  Weekly Treatment Note    Name: [name] Date: 03/04/2014 MRN: [id number] DOB: [birth year]   Current dose: 20 Gy  Current fraction: 5   MEDICATIONS: Current Outpatient Prescriptions  Medication Sig Dispense Refill  . acetaminophen (TYLENOL) 325 MG tablet Take 2 tablets (650 mg total) by mouth every 6 (six) hours as needed for mild pain (or Fever >/= 101).      Marland Kitchen albuterol (PROVENTIL HFA;VENTOLIN HFA) 108 (90 BASE) MCG/ACT inhaler Inhale 2 puffs into the lungs 2 (two) times daily as needed.       . ALPRAZolam (XANAX) 0.5 MG tablet Take 1 tablet (0.5 mg total) by mouth 2 (two) times daily as needed for anxiety.  30 tablet  0  . amiodarone (PACERONE) 200 MG tablet Take 1 tablet (200 mg total) by mouth daily.  30 tablet  1  . apixaban (ELIQUIS) 5 MG TABS tablet Take 1 tablet (5 mg total) by mouth 2 (two) times daily.  60 tablet  1  . Calcium Carbonate (CALTRATE 600 PO) Take 1 tablet by mouth daily.      . cholecalciferol (VITAMIN D) 1000 UNITS tablet Take 1,000 Units by mouth daily.      Marland Kitchen dexamethasone (DECADRON) 2 MG tablet Take 1 tablet (2 mg total) by mouth every 12 (twelve) hours.      . digoxin (LANOXIN) 0.125 MG tablet Take 1 tablet (0.125 mg total) by mouth daily.  30 tablet  1  . diltiazem (CARDIZEM CD) 180 MG 24 hr capsule Take 180 mg by mouth daily.      . diphenhydrAMINE (BENADRYL) 25 MG tablet Take 25 mg by mouth at bedtime.      . feeding supplement, ENSURE COMPLETE, (ENSURE COMPLETE) LIQD Take 237 mLs by mouth 2 (two) times daily between meals.  60 Bottle  6  . folic acid (FOLVITE) 1 MG tablet Take 1 tablet (1 mg total) by mouth daily.  30 tablet  1  . gabapentin (NEURONTIN) 100 MG capsule Take 1 capsule (100 mg total) by mouth 3 (three) times daily.  90 capsule  0  . iron polysaccharides (NIFEREX) 150 MG capsule Take 1 capsule (150 mg total) by mouth daily.  30 capsule  1  . levalbuterol  (XOPENEX) 0.63 MG/3ML nebulizer solution Take 0.63 mg by nebulization every 4 (four) hours as needed for wheezing or shortness of breath.      . levothyroxine (SYNTHROID, LEVOTHROID) 25 MCG tablet Take 25 mcg by mouth daily before breakfast.      . lidocaine (LIDODERM) 5 % Place 1 patch onto the skin daily. Remove & Discard patch within 12 hours or as directed by MD  30 patch  0  . Multiple Vitamin (MULTIVITAMIN WITH MINERALS) TABS tablet Take 3 tablets by mouth daily.      . ondansetron (ZOFRAN) 4 MG tablet Take 1 tablet (4 mg total) by mouth every 6 (six) hours as needed for nausea.  20 tablet  0  . oxyCODONE (OXY IR/ROXICODONE) 5 MG immediate release tablet Take one tablet by mouth every 6 hours as needed for pain  120 tablet  0  . pantoprazole (PROTONIX) 40 MG tablet Take 40 mg by mouth daily.      . polyethylene glycol (MIRALAX / GLYCOLAX) packet Take 17 g by mouth daily.      . pregabalin (LYRICA) 25 MG capsule Take 1 capsule (25 mg total)  by mouth at bedtime.  30 capsule  5  . thiamine 100 MG tablet Take 1 tablet (100 mg total) by mouth daily.  30 tablet  1   No current facility-administered medications for this encounter.     ALLERGIES: Paxil; Lisinopril; and Penicillins   LABORATORY DATA:  Lab Results  Component Value Date   WBC 9.4 02/27/2014   HGB 9.8* 02/27/2014   HCT 31.7* 02/27/2014   MCV 93.8 02/27/2014   PLT 293 02/27/2014   Lab Results  Component Value Date   NA 141 02/27/2014   K 4.9 02/27/2014   CL 104 02/27/2014   CO2 25 02/27/2014   Lab Results  Component Value Date   ALT 13 02/23/2014   AST 19 02/23/2014   ALKPHOS 104 02/23/2014   BILITOT 0.4 02/23/2014     NARRATIVE: Stephanie Oliver was seen today for weekly treatment management. The chart was checked and the patient's films were reviewed. The patient completed her radiation treatment today. She received a short course of palliative radiation treatment to the sacrum. She states she has done fine with treatment. No  significant nausea or diarrhea.  PHYSICAL EXAMINATION: oral temperature is 97.8 F (36.6 C). Her blood pressure is 135/75 and her pulse is 96. Her respiration is 20 and oxygen saturation is 100%.        ASSESSMENT: The patient did satisfactorily with treatment.  PLAN: The patient will follow-up in our clinic in 1 month.

## 2014-03-08 ENCOUNTER — Other Ambulatory Visit

## 2014-03-08 ENCOUNTER — Ambulatory Visit (HOSPITAL_COMMUNITY)

## 2014-03-10 ENCOUNTER — Ambulatory Visit (HOSPITAL_COMMUNITY)

## 2014-03-14 ENCOUNTER — Telehealth: Payer: Self-pay | Admitting: Medical Oncology

## 2014-03-14 NOTE — Telephone Encounter (Signed)
err

## 2014-03-15 ENCOUNTER — Encounter: Payer: Self-pay | Admitting: Internal Medicine

## 2014-03-15 ENCOUNTER — Ambulatory Visit (HOSPITAL_BASED_OUTPATIENT_CLINIC_OR_DEPARTMENT_OTHER): Admitting: Internal Medicine

## 2014-03-15 ENCOUNTER — Other Ambulatory Visit (HOSPITAL_BASED_OUTPATIENT_CLINIC_OR_DEPARTMENT_OTHER)

## 2014-03-15 ENCOUNTER — Encounter: Payer: Self-pay | Admitting: *Deleted

## 2014-03-15 VITALS — BP 113/56 | HR 92 | Temp 98.3°F | Resp 20

## 2014-03-15 DIAGNOSIS — C3410 Malignant neoplasm of upper lobe, unspecified bronchus or lung: Secondary | ICD-10-CM

## 2014-03-15 DIAGNOSIS — G609 Hereditary and idiopathic neuropathy, unspecified: Secondary | ICD-10-CM

## 2014-03-15 DIAGNOSIS — C7952 Secondary malignant neoplasm of bone marrow: Secondary | ICD-10-CM

## 2014-03-15 DIAGNOSIS — C3491 Malignant neoplasm of unspecified part of right bronchus or lung: Secondary | ICD-10-CM

## 2014-03-15 DIAGNOSIS — C787 Secondary malignant neoplasm of liver and intrahepatic bile duct: Secondary | ICD-10-CM

## 2014-03-15 DIAGNOSIS — Z9181 History of falling: Secondary | ICD-10-CM

## 2014-03-15 DIAGNOSIS — C7951 Secondary malignant neoplasm of bone: Secondary | ICD-10-CM

## 2014-03-15 DIAGNOSIS — R5381 Other malaise: Secondary | ICD-10-CM

## 2014-03-15 DIAGNOSIS — R5383 Other fatigue: Secondary | ICD-10-CM

## 2014-03-15 HISTORY — DX: History of falling: Z91.81

## 2014-03-15 LAB — CBC WITH DIFFERENTIAL/PLATELET
BASO%: 0.2 % (ref 0.0–2.0)
Basophils Absolute: 0 10*3/uL (ref 0.0–0.1)
EOS%: 0.4 % (ref 0.0–7.0)
Eosinophils Absolute: 0 10*3/uL (ref 0.0–0.5)
HCT: 33.7 % — ABNORMAL LOW (ref 34.8–46.6)
HGB: 10.9 g/dL — ABNORMAL LOW (ref 11.6–15.9)
LYMPH%: 3.5 % — ABNORMAL LOW (ref 14.0–49.7)
MCH: 30.6 pg (ref 25.1–34.0)
MCHC: 32.2 g/dL (ref 31.5–36.0)
MCV: 95.1 fL (ref 79.5–101.0)
MONO#: 0.6 10*3/uL (ref 0.1–0.9)
MONO%: 6 % (ref 0.0–14.0)
NEUT#: 9.7 10*3/uL — ABNORMAL HIGH (ref 1.5–6.5)
NEUT%: 89.9 % — ABNORMAL HIGH (ref 38.4–76.8)
Platelets: 172 10*3/uL (ref 145–400)
RBC: 3.55 10*6/uL — AB (ref 3.70–5.45)
RDW: 18.7 % — AB (ref 11.2–14.5)
WBC: 10.8 10*3/uL — ABNORMAL HIGH (ref 3.9–10.3)
lymph#: 0.4 10*3/uL — ABNORMAL LOW (ref 0.9–3.3)

## 2014-03-15 LAB — COMPREHENSIVE METABOLIC PANEL (CC13)
ALBUMIN: 2.5 g/dL — AB (ref 3.5–5.0)
ALT: 57 U/L — AB (ref 0–55)
ANION GAP: 6 meq/L (ref 3–11)
AST: 23 U/L (ref 5–34)
Alkaline Phosphatase: 79 U/L (ref 40–150)
BUN: 24.2 mg/dL (ref 7.0–26.0)
CO2: 33 meq/L — AB (ref 22–29)
CREATININE: 0.5 mg/dL — AB (ref 0.6–1.1)
Calcium: 8.5 mg/dL (ref 8.4–10.4)
Chloride: 93 mEq/L — ABNORMAL LOW (ref 98–109)
Glucose: 121 mg/dl (ref 70–140)
Potassium: 4.4 mEq/L (ref 3.5–5.1)
SODIUM: 131 meq/L — AB (ref 136–145)
TOTAL PROTEIN: 5.7 g/dL — AB (ref 6.4–8.3)
Total Bilirubin: 0.23 mg/dL (ref 0.20–1.20)

## 2014-03-15 NOTE — Progress Notes (Signed)
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  2) Systemic chemotherapy with carboplatin for AUC of 5 and paclitaxel 175 mg/M2 every 3 weeks with Neulasta support. Status post 6 cycle. First cycle on 09/04/2013    CURRENT THERAPY: palliative care and hospice.  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   INTERVAL HISTORY: Stephanie Oliver 63 y.o. female returns for follow up visit accompanied by her caregiver. The patient is currently a resident of the skilled nursing facility. She completed a course of palliative radiotherapy to the sacrum under the care of Dr. Lisbeth Renshaw last week. She has some improvement of her pain. She continues to have significant fatigue and weakness. Her recent imaging studies during hospitalization to [facility] showed evidence for disease progression especially in the  liver. 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 She denied having any significant chest pain, cough or hemoptysis. She denied having any fever or chills. She has no significant weight loss or night sweats. She has no nausea or vomiting. She is here today for evaluation and discussion of her treatment options.  MEDICAL HISTORY: Past Medical History  Diagnosis Date  . Depression   . Panic attacks   . Hypertension   . Smoker   . Alcohol abuse   . History of radiation therapy 07/28/13-08/25/13    rt lung,37.5Gy/13fx  . Primary cancer of right lung     ALLERGIES:  is allergic to paxil; lisinopril; and penicillins.  MEDICATIONS:  Current Outpatient Prescriptions  Medication Sig Dispense Refill  . acetaminophen (TYLENOL) 325 MG tablet Take 2 tablets (650 mg total) by mouth every 6 (six) hours as needed for mild pain (or Fever >/= 101).      Marland Kitchen albuterol (PROVENTIL HFA;VENTOLIN HFA) 108 (90 BASE) MCG/ACT inhaler Inhale 2 puffs into the lungs 2 (two) times daily as needed.       . ALPRAZolam (XANAX) 0.5 MG tablet Take 1 tablet (0.5 mg total) by mouth 2 (two) times daily as needed for anxiety.  30 tablet  0  . amiodarone (PACERONE) 200 MG tablet Take 1 tablet (200 mg total) by mouth daily.  30 tablet  1  . apixaban (ELIQUIS) 5 MG TABS tablet Take 1 tablet (5 mg total) by mouth 2 (two) times daily.  60 tablet  1  . Calcium Carbonate (CALTRATE 600 PO) Take 1 tablet by mouth daily.      Marland Kitchen  cholecalciferol (VITAMIN D) 1000 UNITS tablet Take 1,000 Units by mouth daily.      Marland Kitchen dexamethasone (DECADRON) 2 MG tablet Take 1 tablet (2 mg total) by mouth every 12 (twelve) hours.      . digoxin (LANOXIN) 0.125 MG tablet Take 1 tablet (0.125 mg total) by mouth daily.  30 tablet  1  . diltiazem (CARDIZEM CD) 180 MG 24 hr capsule Take 180 mg by mouth daily.      . diphenhydrAMINE (BENADRYL) 25 MG tablet Take 25 mg by mouth at bedtime.      . feeding supplement, ENSURE  COMPLETE, (ENSURE COMPLETE) LIQD Take 237 mLs by mouth 2 (two) times daily between meals.  60 Bottle  6  . folic acid (FOLVITE) 1 MG tablet Take 1 tablet (1 mg total) by mouth daily.  30 tablet  1  . gabapentin (NEURONTIN) 100 MG capsule Take 1 capsule (100 mg total) by mouth 3 (three) times daily.  90 capsule  0  . iron polysaccharides (NIFEREX) 150 MG capsule Take 1 capsule (150 mg total) by mouth daily.  30 capsule  1  . levalbuterol (XOPENEX) 0.63 MG/3ML nebulizer solution Take 0.63 mg by nebulization every 4 (four) hours as needed for wheezing or shortness of breath.      . levothyroxine (SYNTHROID, LEVOTHROID) 25 MCG tablet Take 25 mcg by mouth daily before breakfast.      . lidocaine (LIDODERM) 5 % Place 1 patch onto the skin daily. Remove & Discard patch within 12 hours or as directed by MD  30 patch  0  . Multiple Vitamin (MULTIVITAMIN WITH MINERALS) TABS tablet Take 3 tablets by mouth daily.      Marland Kitchen oxyCODONE (OXY IR/ROXICODONE) 5 MG immediate release tablet Take one tablet by mouth every 6 hours as needed for pain  120 tablet  0  . pantoprazole (PROTONIX) 40 MG tablet Take 40 mg by mouth daily.      . polyethylene glycol (MIRALAX / GLYCOLAX) packet Take 17 g by mouth daily.      . pregabalin (LYRICA) 25 MG capsule Take 1 capsule (25 mg total) by mouth at bedtime.  30 capsule  5  . thiamine 100 MG tablet Take 1 tablet (100 mg total) by mouth daily.  30 tablet  1  . ondansetron (ZOFRAN) 4 MG tablet Take 1 tablet (4 mg total) by mouth every 6 (six) hours as needed for nausea.  20 tablet  0   No current facility-administered medications for this visit.    SURGICAL HISTORY:  Past Surgical History  Procedure Laterality Date  . Tonsillectomy    . Back surgery    . Femur im nail Right 06/22/2013    Procedure: RIGHT HIP GAMMA NAIL FIXATION   ;  Surgeon: Renette Butters, MD;  Location: [facility];  Service: Orthopedics;  Laterality: Right;  . Video bronchoscopy with endobronchial ultrasound N/A  06/22/2013    Procedure: VIDEO BRONCHOSCOPY WITH ENDOBRONCHIAL ULTRASOUND;  Surgeon: Doree Fudge, MD;  Location: [facility];  Service: Pulmonary;  Laterality: N/A;    REVIEW OF SYSTEMS:  Constitutional: positive for fatigue Eyes: negative Ears, nose, mouth, throat, and face: negative Respiratory: positive for cough and dyspnea on exertion Cardiovascular: negative Gastrointestinal: negative Genitourinary:negative Integument/breast: negative Hematologic/lymphatic: negative Musculoskeletal:positive for bone pain Neurological: negative Behavioral/Psych: positive for anxiety Endocrine: negative Allergic/Immunologic: negative   PHYSICAL EXAMINATION: General appearance: alert, cooperative, appears stated age and no distress Head: Normocephalic, without obvious abnormality, atraumatic Neck: no adenopathy, no carotid  bruit, no JVD, supple, symmetrical, trachea midline and thyroid not enlarged, symmetric, no tenderness/mass/nodules Lymph nodes: Cervical, supraclavicular, and axillary nodes normal. Resp: clear to auscultation bilaterally Cardio: regular rate and rhythm, S1, S2 normal, no murmur, click, rub or gallop GI: soft, non-tender; bowel sounds normal; no masses,  no organomegaly Extremities: Patient continues to have pain related to her right hip stabilization surgery on 06/22/2013 as well as some right lower extremity edema Neurologic: Grossly normal  ECOG PERFORMANCE STATUS: 2 - Symptomatic, <50% confined to bed  Blood pressure 113/56, pulse 92, temperature 98.3 F (36.8 C), temperature source Oral, resp. rate 20, weight 0 lb (0 kg).  LABORATORY DATA: Lab Results  Component Value Date   WBC 10.8* 03/15/2014   HGB 10.9* 03/15/2014   HCT 33.7* 03/15/2014   MCV 95.1 03/15/2014   PLT 172 03/15/2014      Chemistry      Component Value Date/Time   NA 131* 03/15/2014 1100   NA 141 02/27/2014 0521   K 4.4 03/15/2014 1100   K 4.9 02/27/2014 0521   CL 104 02/27/2014 0521   CO2 33*  03/15/2014 1100   CO2 25 02/27/2014 0521   BUN 24.2 03/15/2014 1100   BUN 21 02/27/2014 0521   CREATININE 0.5* 03/15/2014 1100   CREATININE 0.48* 02/27/2014 0521      Component Value Date/Time   CALCIUM 8.5 03/15/2014 1100   CALCIUM 8.9 02/27/2014 0521   ALKPHOS 79 03/15/2014 1100   ALKPHOS 104 02/23/2014 0415   AST 23 03/15/2014 1100   AST 19 02/23/2014 0415   ALT 57* 03/15/2014 1100   ALT 13 02/23/2014 0415   BILITOT 0.23 03/15/2014 1100   BILITOT 0.4 02/23/2014 0415       RADIOGRAPHIC STUDIES: Dg Pelvis 1-2 Views  02/22/2014   CLINICAL DATA:  Right foot drop.  Pain.  EXAM: PELVIS - 1-2 VIEW  COMPARISON:  CT 02/22/2014 and 01/08/1999 50.  FINDINGS: Prominent degenerative changes noted lumbar spine and both hips. Diffuse osteopenia. Patient has had prior open reduction internal fixation of right hip fracture, this appears stable. No acute bony abnormality . Contrast noted in the colon and the bladder from prior CT.  IMPRESSION: 1. Diffuse severe osteopenia and degenerative changes lumbar spine and both hips. 2. Open reduction internal fixation right hip fracture. This appears stable. 3. No acute abnormality.   Electronically Signed   By: Marcello Moores  Register   On: 02/22/2014 21:27   Dg Femur Right  02/22/2014   CLINICAL DATA:  Post right hip arthroplasty (06/2013) now with right foot drop and pain involving the right groin.  EXAM: RIGHT FEMUR - 2 VIEW  COMPARISON:  AP pelvis radiographs - earlier same day; CT the chest, abdomen and pelvis - earlier same day; right hip radiographs - 06/21/2013; right knee radiographs - 07/13/2013  FINDINGS: Osteopenia. Post intra medullary fixation of the femur and dynamic screw fixation of the right femoral neck.  There is persistent lucency about the comminuted intertrochanteric femur fracture site without displacement. No definite acute fracture or dislocation.  Severe degenerative change the right hip with joint space loss, subchondral sclerosis and osteophytosis.  The  cancellous screw within the distal diaphysis of the femur is fractured.  IMPRESSION: 1. The distal transfixing femoral intra medullary rod screw is fractured suggestive of hardware loosening. 2. Severe degenerative change of the right hip.   Electronically Signed   By: Sandi Mariscal M.D.   On: 02/22/2014 21:32   Ct Head W Wo  Contrast  02/23/2014   CLINICAL DATA:  Lung cancer.  Rule out metastatic disease  EXAM: CT HEAD WITHOUT AND WITH CONTRAST  TECHNIQUE: Contiguous axial images were obtained from the base of the skull through the vertex without and with intravenous contrast  CONTRAST:  161mL OMNIPAQUE IOHEXOL 300 MG/ML  SOLN  COMPARISON:  CT head 06/23/2013  FINDINGS: Mild atrophy. Moderate chronic microvascular ischemic change in the white matter, similar to the prior study.  Negative for acute infarct. Negative for hemorrhage or mass. No enhancing lesions.  No skull lesions.  IMPRESSION: Atrophy and chronic microvascular ischemia.  Negative for metastatic disease.   Electronically Signed   By: Franchot Gallo M.D.   On: 02/23/2014 16:23   Ct Chest W Contrast  02/22/2014   CLINICAL DATA:  Right-sided weakness radiating to the right hip and leg for 1 week. History of lung, liver and hip cancer.  EXAM: CT CHEST, ABDOMEN, AND PELVIS WITH CONTRAST  TECHNIQUE: Multidetector CT imaging of the chest, abdomen and pelvis was performed following the standard protocol during bolus administration of intravenous contrast.  CONTRAST:  31mL OMNIPAQUE IOHEXOL 300 MG/ML SOLN, 30mL OMNIPAQUE IOHEXOL 300 MG/ML SOLN  COMPARISON:  CT the chest, abdomen pelvis - 01/07/2021  FINDINGS: CT CHEST FINDINGS  Ill-defined soft tissue about the medial aspect of the right upper lobe is grossly unchanged, measuring approximately 1.4 x 3.3 cm (image 27, series 7) as is the ill-defined soft tissue about the more caudal aspect of the right hilum measuring approximately 1.6 cm (image 34, series 7). Grossly unchanged right upper lobe volume loss.  There is unchanged complete collapse of the right middle lobe. Perihilar bronchiectasis and architectural distortion are unchanged compatible with sequela of prior radiation therapy.  There is minimal subsegmental atelectasis within the dependent portion of the left lower lobe. No new focal airspace opacities. No pleural effusion or pneumothorax. The central pulmonary airways are otherwise patent as described.  Unchanged punctate (approximately 7 mm) calcified granuloma within the right upper lobe (image 18, series 7) and partially calcified mediastinal and right hilar lymph nodes, the sequela of prior granulomatous infection.  No worsening mediastinal or hilar lymphadenopathy. No axillary lymphadenopathy.  Normal heart size. Coronary artery calcifications. Atherosclerotic plaque within a normal caliber thoracic aorta.  Conventional configuration of the aortic arch. The origin and proximal aspects of the branch vessels of the aortic arch (in particular, the left subclavian artery) contain a moderate amount of calcified plaque though remain widely patent. No thoracic aortic dissection or periaortic stranding. Although this examination was not tailored for the evaluation of the pulmonary arteries, there are no discrete filling defects within the central pulmonary arterial tree to suggest central pulmonary embolism.  CT ABDOMEN AND PELVIS FINDINGS  Normal hepatic contour. There is progressive metastatic disease within the liver. Dominant centrally necrotic mass within the caudal aspect of the lateral segment of the left lobe of the liver has increased in size, now measuring 4.6 x 3.8 cm (image 60, series 3), previously, 2.8 x 2.5 cm, it is now associated with a minimal amount of adjacent intrahepatic biliary duct dilatation (image 55, series 3). Interval increase in size of the ill-defined hypo attenuating mass with the central aspect of the right lobe of the liver, now measuring approximately 1.5 cm (image 46),  previously, 1.2 cm. The ill-defined area of hyper enhancement within the dome of the right lobe of the liver (image 36, series 3) is unchanged though also remains worrisome for an additional area of  metastatic disease. There is a minimal amount of focal fatty infiltration adjacent to the fissure for ligamentum teres. Normal appearance of the gallbladder. No ascites.  There is symmetric enhancement and excretion of the bilateral kidneys. No renal stones on this postcontrast examination. Left-sided hypoattenuating renal lesions too small to adequately characterize of favored to represent renal cysts. No urinary obstruction or perinephric stranding.  Unchanged mild thickening of the medial limb of the left adrenal gland (image 56, series 5, too small to adequately characterize. Normal appearance of the right adrenal gland and spleen. The pancreas remains atrophic.  Moderate colonic stool burden without evidence of obstruction. Enteric contrast extends to the level of the hepatic flexure of the colon. The bowel is normal in course and caliber without wall thickening or evidence of obstruction. The appendix is not visualized, however there is no pericecal inflammatory change. No pneumoperitoneum, pneumatosis or portal venous gas.  Moderate amount of mixed calcified and noncalcified atherosclerotic plaque within a normal caliber abdominal aorta. The major branch vessels of the abdominal aorta appear patent on this non CTA examination. No retroperitoneal, mesenteric, pelvic or inguinal lymphadenopathy.  Post hysterectomy. No discrete adnexal lesions. No free fluid in the pelvic cul-de-sac.  Ill-defined area of linear sclerosis within the right sacral ale is unchanged (coronal images 55 and 56, series 4). Post intra medullary and dynamic screw fixation of the proximal femur, incompletely imaged. Severe degenerative change the right hip with joint space loss, articular surface irregularity and sclerosis. Moderate to severe  multilevel lumbar spine DDD.  IMPRESSION: Chest Impression  1. Stable post treatment change of the medial aspect of the right upper lobe and right hilum, similar to the 01/07/2014 examination. No evidence of progressive metastatic disease within the chest. 2. Coronary artery calcifications. Abdomen and Pelvis Impression:  1. Progressive metastatic disease within the liver with dominant lesion within the left lobe of the liver now measuring 4.6 cm in diameter, previously, 2.8 cm. 2. Post intra medullary and dynamic screw fixation of the right femur and femoral neck, incompletely imaged and evaluated - further evaluation could be performed with dedicated radiographs of the right hip and/or femur as clinically indicated. 3. Severe degenerative change of the right hip. 4. Unchanged ill-defined area of linear sclerosis within the right sacral ala with differential considerations again including osseous metastatic disease versus is a sacral insufficiency fracture.   Electronically Signed   By: Sandi Mariscal M.D.   On: 02/22/2014 18:20   Mr Lumbar Spine W Wo Contrast  02/24/2014   CLINICAL DATA:  Bilateral lower extremity weakness and right hip/sacral area pain. History of metastatic lung cancer. Evaluate for mass or degenerative nerve compression.  EXAM: MRI LUMBAR SPINE WITHOUT AND WITH CONTRAST  TECHNIQUE: Multiplanar and multiecho pulse sequences of the lumbar spine were obtained without and with intravenous contrast.  CONTRAST:  27mL MULTIHANCE GADOBENATE DIMEGLUMINE 529 MG/ML IV SOLN  COMPARISON:  CT abdomen and pelvis 02/22/2014  FINDINGS: The lowest fully formed intervertebral disc space is labeled L5-S1.  There is no significant listhesis. There is no evidence of compression fracture. There is diffuse, severe disc space narrowing throughout the lumbar spine with degenerative endplate changes. Multiple small Schmorl's nodes are noted. Subcentimeter foci of enhancement abutting the endplates at multiple levels in  the lumbar spine are favored to be degenerative, although small metastases would be difficult to completely exclude.  There is masslike marrow replacement and enhancement in the right sacral ala extending into the right posterolateral S1 vertebral body measuring  approximately 4.6 x 3.5 cm with surrounding edema. This corresponds to the sclerosis and hypermetabolism previously described on CT and PET. Curvilinear low signal within this region of abnormality may reflect a superimposed pathologic fracture corresponding to more linear abnormality on CT. Enhancing tissue extends medially into the S1-2 neural foramen and contacts and mildly displaces the right S1 nerve root. The lesion may also extend into the inferior aspect of the right L5-S1 neural foramen and contribute to severe foraminal stenosis. Conus medullaris is normal in signal and terminates at T12-L1.  T10-11: Only imaged sagittally. Mild disc bulge without evidence of high-grade stenosis.  T11-12: Only imaged sagittally. Minimal disc bulging without stenosis.  T12-L1: Mild disc bulge results in mild bilateral lateral recess stenosis without spinal canal or neural foraminal stenosis.  L1-2: Moderate circumferential disc bulging and mild facet and ligamentum flavum hypertrophy result in mild to moderate lateral recess stenosis, mild spinal stenosis, and mild bilateral neural foraminal stenosis.  L2-3: Prominent circumferential disc bulging and moderate facet and ligamentum flavum hypertrophy result in severe spinal stenosis and mild bilateral neural foraminal stenosis.  L3-4: Moderate circumferential disc bulging and mild-to-moderate facet and ligamentum flavum hypertrophy result in bilateral lateral recess stenosis, moderate spinal stenosis, and mild bilateral neural foraminal stenosis.  L4-5: Evidence of prior left laminotomy. Circumferential disc bulging, superimposed central disc protrusion, and facet hypertrophy result in severe spinal stenosis and  moderate right and mild left neural foraminal stenosis.  L5-S1: Disc bulging and moderate to severe facet hypertrophy resulting moderate spinal stenosis and severe bilateral neural foraminal stenosis.  IMPRESSION: 1. Abnormal appearance of the right sacrum consistent with metastasis. The lesion extends into the right S1-2 neural foramen, affecting the right S1 nerve. There is likely an associated pathologic fracture. 2. Severe, diffuse disc degeneration throughout the lumbar spine as above, resulting in severe spinal stenosis at L2-3 and L4-5 and severe bilateral neural foraminal stenosis at L5-S1.   Electronically Signed   By: Logan Bores   On: 02/24/2014 18:41   Ct Abdomen Pelvis W Contrast  02/22/2014   CLINICAL DATA:  Right-sided weakness radiating to the right hip and leg for 1 week. History of lung, liver and hip cancer.  EXAM: CT CHEST, ABDOMEN, AND PELVIS WITH CONTRAST  TECHNIQUE: Multidetector CT imaging of the chest, abdomen and pelvis was performed following the standard protocol during bolus administration of intravenous contrast.  CONTRAST:  8mL OMNIPAQUE IOHEXOL 300 MG/ML SOLN, 25mL OMNIPAQUE IOHEXOL 300 MG/ML SOLN  COMPARISON:  CT the chest, abdomen pelvis - 01/07/2021  FINDINGS: CT CHEST FINDINGS  Ill-defined soft tissue about the medial aspect of the right upper lobe is grossly unchanged, measuring approximately 1.4 x 3.3 cm (image 27, series 7) as is the ill-defined soft tissue about the more caudal aspect of the right hilum measuring approximately 1.6 cm (image 34, series 7). Grossly unchanged right upper lobe volume loss. There is unchanged complete collapse of the right middle lobe. Perihilar bronchiectasis and architectural distortion are unchanged compatible with sequela of prior radiation therapy.  There is minimal subsegmental atelectasis within the dependent portion of the left lower lobe. No new focal airspace opacities. No pleural effusion or pneumothorax. The central pulmonary  airways are otherwise patent as described.  Unchanged punctate (approximately 7 mm) calcified granuloma within the right upper lobe (image 18, series 7) and partially calcified mediastinal and right hilar lymph nodes, the sequela of prior granulomatous infection.  No worsening mediastinal or hilar lymphadenopathy. No axillary lymphadenopathy.  Normal heart size.  Coronary artery calcifications. Atherosclerotic plaque within a normal caliber thoracic aorta.  Conventional configuration of the aortic arch. The origin and proximal aspects of the branch vessels of the aortic arch (in particular, the left subclavian artery) contain a moderate amount of calcified plaque though remain widely patent. No thoracic aortic dissection or periaortic stranding. Although this examination was not tailored for the evaluation of the pulmonary arteries, there are no discrete filling defects within the central pulmonary arterial tree to suggest central pulmonary embolism.  CT ABDOMEN AND PELVIS FINDINGS  Normal hepatic contour. There is progressive metastatic disease within the liver. Dominant centrally necrotic mass within the caudal aspect of the lateral segment of the left lobe of the liver has increased in size, now measuring 4.6 x 3.8 cm (image 60, series 3), previously, 2.8 x 2.5 cm, it is now associated with a minimal amount of adjacent intrahepatic biliary duct dilatation (image 55, series 3). Interval increase in size of the ill-defined hypo attenuating mass with the central aspect of the right lobe of the liver, now measuring approximately 1.5 cm (image 46), previously, 1.2 cm. The ill-defined area of hyper enhancement within the dome of the right lobe of the liver (image 36, series 3) is unchanged though also remains worrisome for an additional area of metastatic disease. There is a minimal amount of focal fatty infiltration adjacent to the fissure for ligamentum teres. Normal appearance of the gallbladder. No ascites.  There  is symmetric enhancement and excretion of the bilateral kidneys. No renal stones on this postcontrast examination. Left-sided hypoattenuating renal lesions too small to adequately characterize of favored to represent renal cysts. No urinary obstruction or perinephric stranding.  Unchanged mild thickening of the medial limb of the left adrenal gland (image 56, series 5, too small to adequately characterize. Normal appearance of the right adrenal gland and spleen. The pancreas remains atrophic.  Moderate colonic stool burden without evidence of obstruction. Enteric contrast extends to the level of the hepatic flexure of the colon. The bowel is normal in course and caliber without wall thickening or evidence of obstruction. The appendix is not visualized, however there is no pericecal inflammatory change. No pneumoperitoneum, pneumatosis or portal venous gas.  Moderate amount of mixed calcified and noncalcified atherosclerotic plaque within a normal caliber abdominal aorta. The major branch vessels of the abdominal aorta appear patent on this non CTA examination. No retroperitoneal, mesenteric, pelvic or inguinal lymphadenopathy.  Post hysterectomy. No discrete adnexal lesions. No free fluid in the pelvic cul-de-sac.  Ill-defined area of linear sclerosis within the right sacral ale is unchanged (coronal images 55 and 56, series 4). Post intra medullary and dynamic screw fixation of the proximal femur, incompletely imaged. Severe degenerative change the right hip with joint space loss, articular surface irregularity and sclerosis. 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  IMPRESSION: Chest Impression  1. Stable post treatment change of the medial aspect of the right upper lobe and right hilum, similar to the 01/07/2014 examination. No evidence of progressive metastatic disease within the chest. 2. Coronary artery calcifications. Abdomen and Pelvis Impression:  1. Progressive metastatic disease within the liver  with dominant lesion within the left lobe of the liver now measuring 4.6 cm in diameter, previously, 2.8 cm. 2. Post intra medullary and dynamic screw fixation of the right femur and femoral neck, incompletely imaged and evaluated - further evaluation could be performed with dedicated radiographs of the right hip and/or femur as clinically indicated. 3. Severe degenerative change of the right  hip. 4. Unchanged ill-defined area of linear sclerosis within the right sacral ala with differential considerations again including osseous metastatic disease versus is a sacral insufficiency fracture.   Electronically Signed   By: Sandi Mariscal M.D.   On: 02/22/2014 18:20   ASSESSMENT/PLAN:  This is a very pleasant 63 years old white female with recently diagnosed with stage IV non-small cell lung cancer with recent PET scan showing liver and bone metastasis. The patient completed a course of concurrent chemoradiation to the right upper lobe lung mass and mediastinum for 3 weeks.  She also completed a course of systemic chemotherapy with carboplatin and paclitaxel status post 6 cycles. She tolerated her treatment fairly well with no significant adverse effects except for the fatigue and peripheral neuropathy. She also recently completed a course of palliative radiotherapy to the sacral area. Her recent CT scan of the abdomen showed evidence for disease progression especially in the liver. I had a lengthy discussion with the patient and her care giver today about her current disease status and treatment options. I gave the patient the option of continuing with the palliative care and hospice versus consideration of treatment with immunotherapy with single agent Nivolumab versus systemic chemotherapy with single agent like gemcitabine. I discussed with the patient adverse effects of these treatments. She was unable to make a decision today regarding her treatment options. She requested some time to think about her  options. She will continue with the palliative care and hospice for now but I would be happy to consider her for treatment if she changes her mind. She was advised to call immediately if she has any concerning symptoms in the interval.  Disclaimer: This note was dictated with voice recognition software. Similar sounding words can inadvertently be transcribed and may not be corrected upon review. Eilleen Kempf., MD 03/15/2014

## 2014-03-16 NOTE — Progress Notes (Signed)
Late entry for 03-15-2014 at 12:20 pm.   This nurse was asked by Ms/ Wilma at [facility] to "assist patient in bathroom that [place] out her wheelchair".  To lobby restroom near [place] entry area.  Patient seated in her wheelchair.  No injury to patient.  A&O.  Denies pain and denies falling.  Skin tear noted to posterior left wrist approximately 0.25 cm, shiny and red center with no bleeding.  Caretaker reports the armbands to left wrist caused this.  Patient asked for legs to be placed back on the wheelchair.  Denies need to go to bathroom stating she will go when she returns to the [facility].  With furthet assessment questions states "I don't want to talk about this".  No injury to patient.  Dr. Julien Nordmann notified.  Patient left building to [place] area via wheelchair with caretaker.

## 2014-03-18 ENCOUNTER — Encounter: Payer: Self-pay | Admitting: Radiation Oncology

## 2014-03-21 ENCOUNTER — Inpatient Hospital Stay (HOSPITAL_COMMUNITY)
Admission: EM | Admit: 2014-03-21 | Discharge: 2014-04-17 | DRG: 871 | Disposition: E | Payer: Medicare Other | Attending: Internal Medicine | Admitting: Internal Medicine

## 2014-03-21 ENCOUNTER — Emergency Department (HOSPITAL_COMMUNITY): Payer: Medicare Other

## 2014-03-21 ENCOUNTER — Encounter (HOSPITAL_COMMUNITY): Payer: Self-pay | Admitting: Emergency Medicine

## 2014-03-21 DIAGNOSIS — R4182 Altered mental status, unspecified: Secondary | ICD-10-CM | POA: Diagnosis not present

## 2014-03-21 DIAGNOSIS — C787 Secondary malignant neoplasm of liver and intrahepatic bile duct: Secondary | ICD-10-CM | POA: Diagnosis present

## 2014-03-21 DIAGNOSIS — J440 Chronic obstructive pulmonary disease with acute lower respiratory infection: Secondary | ICD-10-CM | POA: Diagnosis present

## 2014-03-21 DIAGNOSIS — G9341 Metabolic encephalopathy: Secondary | ICD-10-CM | POA: Diagnosis present

## 2014-03-21 DIAGNOSIS — J9600 Acute respiratory failure, unspecified whether with hypoxia or hypercapnia: Secondary | ICD-10-CM | POA: Diagnosis present

## 2014-03-21 DIAGNOSIS — Z923 Personal history of irradiation: Secondary | ICD-10-CM

## 2014-03-21 DIAGNOSIS — Z87891 Personal history of nicotine dependence: Secondary | ICD-10-CM | POA: Diagnosis not present

## 2014-03-21 DIAGNOSIS — Z66 Do not resuscitate: Secondary | ICD-10-CM | POA: Diagnosis present

## 2014-03-21 DIAGNOSIS — A419 Sepsis, unspecified organism: Principal | ICD-10-CM | POA: Diagnosis present

## 2014-03-21 DIAGNOSIS — C3491 Malignant neoplasm of unspecified part of right bronchus or lung: Secondary | ICD-10-CM | POA: Diagnosis present

## 2014-03-21 DIAGNOSIS — T68XXXA Hypothermia, initial encounter: Secondary | ICD-10-CM

## 2014-03-21 DIAGNOSIS — R6520 Severe sepsis without septic shock: Secondary | ICD-10-CM | POA: Diagnosis present

## 2014-03-21 DIAGNOSIS — Z79899 Other long term (current) drug therapy: Secondary | ICD-10-CM

## 2014-03-21 DIAGNOSIS — F329 Major depressive disorder, single episode, unspecified: Secondary | ICD-10-CM | POA: Diagnosis present

## 2014-03-21 DIAGNOSIS — I10 Essential (primary) hypertension: Secondary | ICD-10-CM | POA: Diagnosis present

## 2014-03-21 DIAGNOSIS — J189 Pneumonia, unspecified organism: Secondary | ICD-10-CM | POA: Diagnosis present

## 2014-03-21 DIAGNOSIS — J449 Chronic obstructive pulmonary disease, unspecified: Secondary | ICD-10-CM | POA: Diagnosis present

## 2014-03-21 DIAGNOSIS — Z8249 Family history of ischemic heart disease and other diseases of the circulatory system: Secondary | ICD-10-CM | POA: Diagnosis not present

## 2014-03-21 DIAGNOSIS — G9340 Encephalopathy, unspecified: Secondary | ICD-10-CM

## 2014-03-21 DIAGNOSIS — Z515 Encounter for palliative care: Secondary | ICD-10-CM | POA: Diagnosis not present

## 2014-03-21 HISTORY — DX: History of falling: Z91.81

## 2014-03-21 LAB — COMPREHENSIVE METABOLIC PANEL
ALT: 50 U/L — AB (ref 0–35)
AST: 22 U/L (ref 0–37)
Albumin: 2.5 g/dL — ABNORMAL LOW (ref 3.5–5.2)
Alkaline Phosphatase: 68 U/L (ref 39–117)
Anion gap: 6 (ref 5–15)
BUN: 33 mg/dL — ABNORMAL HIGH (ref 6–23)
CALCIUM: 8 mg/dL — AB (ref 8.4–10.5)
CO2: 37 meq/L — AB (ref 19–32)
CREATININE: 0.21 mg/dL — AB (ref 0.50–1.10)
Chloride: 95 mEq/L — ABNORMAL LOW (ref 96–112)
GFR calc Af Amer: >90 mL/min (ref >90)
Glucose, Bld: 123 mg/dL — ABNORMAL HIGH (ref 70–99)
Potassium: 5 mEq/L (ref 3.7–5.3)
Sodium: 138 mEq/L (ref 137–147)
Total Bilirubin: 0.2 mg/dL — ABNORMAL LOW (ref 0.3–1.2)
Total Protein: 5.9 g/dL — ABNORMAL LOW (ref 6.0–8.3)

## 2014-03-21 LAB — CBC WITH DIFFERENTIAL/PLATELET
Basophils Absolute: 0 10*3/uL (ref 0.0–0.1)
Basophils Relative: 0 % (ref 0–1)
EOS PCT: 0 % (ref 0–5)
Eosinophils Absolute: 0 10*3/uL (ref 0.0–0.7)
HEMATOCRIT: 35.5 % — AB (ref 36.0–46.0)
HEMOGLOBIN: 10.9 g/dL — AB (ref 12.0–15.0)
LYMPHS ABS: 0.3 10*3/uL — AB (ref 0.7–4.0)
Lymphocytes Relative: 4 % — ABNORMAL LOW (ref 12–46)
MCH: 30.8 pg (ref 26.0–34.0)
MCHC: 30.7 g/dL (ref 30.0–36.0)
MCV: 100.3 fL — AB (ref 78.0–100.0)
MONO ABS: 0.7 10*3/uL (ref 0.1–1.0)
Monocytes Relative: 10 % (ref 3–12)
Neutro Abs: 6 10*3/uL (ref 1.7–7.7)
Neutrophils Relative %: 86 % — ABNORMAL HIGH (ref 43–77)
Platelets: 115 10*3/uL — ABNORMAL LOW (ref 150–400)
RBC: 3.54 MIL/uL — AB (ref 3.87–5.11)
RDW: 18.2 % — ABNORMAL HIGH (ref 11.5–15.5)
WBC: 6.9 10*3/uL (ref 4.0–10.5)

## 2014-03-21 LAB — LACTIC ACID, PLASMA: LACTIC ACID, VENOUS: 0.8 mmol/L (ref 0.5–2.2)

## 2014-03-21 LAB — TROPONIN I: Troponin I: <0.3 ng/mL (ref <0.30)

## 2014-03-21 MED ORDER — ALBUTEROL SULFATE (2.5 MG/3ML) 0.083% IN NEBU
5.0000 mg | INHALATION_SOLUTION | Freq: Once | RESPIRATORY_TRACT | Status: AC
  Administered 2014-03-21: 5 mg via RESPIRATORY_TRACT
  Filled 2014-03-21: qty 6

## 2014-03-21 MED ORDER — ATROPINE SULFATE 1 % OP SOLN
4.0000 [drp] | OPHTHALMIC | Status: DC | PRN
  Filled 2014-03-21: qty 2

## 2014-03-21 MED ORDER — ONDANSETRON HCL 4 MG/2ML IJ SOLN: 4.0000 mg | Freq: Four times a day (QID) | INTRAMUSCULAR | Status: DC | PRN

## 2014-03-21 MED ORDER — MORPHINE SULFATE 10 MG/ML IJ SOLN
1.0000 mg/h | INTRAMUSCULAR | Status: DC
  Administered 2014-03-21: 1 mg/h via INTRAVENOUS
  Filled 2014-03-21: qty 10

## 2014-03-21 MED ORDER — LORAZEPAM 2 MG/ML PO CONC
1.0000 mg | ORAL | Status: DC | PRN
Start: 2014-03-21 — End: 2014-03-22
  Filled 2014-03-21: qty 0.5

## 2014-03-21 MED ORDER — ACETAMINOPHEN 650 MG RE SUPP: 650.0000 mg | Freq: Four times a day (QID) | RECTAL | Status: DC | PRN

## 2014-03-21 MED ORDER — MORPHINE SULFATE (CONCENTRATE) 10 MG /0.5 ML PO SOLN
5.0000 mg | ORAL | Status: DC | PRN
Start: 2014-03-21 — End: 2014-03-22

## 2014-03-21 MED ORDER — SCOPOLAMINE 1 MG/3DAYS TD PT72
1.0000 | MEDICATED_PATCH | TRANSDERMAL | Status: DC
  Administered 2014-03-21: 1.5 mg via TRANSDERMAL
  Filled 2014-03-21: qty 1

## 2014-03-21 MED ORDER — DEXTROSE 5 % IV SOLN
1.0000 mg/h | INTRAVENOUS | Status: DC
Start: 2014-03-21 — End: 2014-03-21

## 2014-03-21 MED ORDER — NALOXONE HCL 1 MG/ML IJ SOLN
2.0000 mg | Freq: Once | INTRAMUSCULAR | Status: AC
  Administered 2014-03-21: 2 mg via INTRAVENOUS
  Filled 2014-03-21: qty 2

## 2014-03-21 MED ORDER — DEXTROSE 5 % IV SOLN
1.0000 mg/h | INTRAVENOUS | Status: DC
  Administered 2014-03-21: 1 mg/h via INTRAVENOUS

## 2014-03-21 MED ORDER — DIPHENHYDRAMINE HCL 50 MG/ML IJ SOLN
12.5000 mg | INTRAMUSCULAR | Status: DC | PRN
Start: 2014-03-21 — End: 2014-03-22

## 2014-03-21 MED ORDER — ACETAMINOPHEN 325 MG PO TABS
650.0000 mg | ORAL_TABLET | Freq: Four times a day (QID) | ORAL | Status: DC | PRN
Start: 2014-03-21 — End: 2014-03-22

## 2014-03-21 MED ORDER — MORPHINE SULFATE (CONCENTRATE) 10 MG /0.5 ML PO SOLN
5.0000 mg | ORAL | Status: DC | PRN
Start: 2014-03-21 — End: 2014-03-22
  Filled 2014-03-21: qty 0.5

## 2014-03-21 MED ORDER — ONDANSETRON HCL 4 MG PO TABS: 4.0000 mg | ORAL_TABLET | Freq: Four times a day (QID) | ORAL | Status: DC | PRN

## 2014-03-21 MED ORDER — MORPHINE SULFATE 2 MG/ML IJ SOLN
1.0000 mg | INTRAMUSCULAR | Status: DC | PRN
  Administered 2014-03-21: 1 mg via INTRAVENOUS
  Filled 2014-03-21: qty 1

## 2014-03-21 MED ORDER — POLYVINYL ALCOHOL 1.4 % OP SOLN
1.0000 [drp] | Freq: Four times a day (QID) | OPHTHALMIC | Status: DC | PRN
Start: 2014-03-21 — End: 2014-03-22
  Filled 2014-03-21: qty 15

## 2014-03-31 DIAGNOSIS — C7951 Secondary malignant neoplasm of bone: Secondary | ICD-10-CM | POA: Insufficient documentation

## 2014-03-31 NOTE — Addendum Note (Signed)
Encounter addended by: Marye Round, MD on: 03/31/2014  1:46 PM<BR>     Documentation filed: Notes Section

## 2014-03-31 NOTE — Progress Notes (Signed)
  Radiation Oncology         [phone] ________________________________  Name: Stephanie Oliver MRN: [id number]  Date: 03/04/2014  DOB: 07/31/1950  End of Treatment Note  Diagnosis:   Metastatic lung cancer     Indication for treatment:  palliaitve       Radiation treatment dates:   02/28/14 - 03/04/14  Site/dose:   The patient was treated to the sacrum to a dose of 20 Gy in 5 fractions in a hypofractionated manner. This was accomplished with a 3D conformal, 4-field technique.  Narrative: The patient tolerated radiation treatment relatively well. She did not have difficulty with acute GI toxicity.  Plan: The patient has completed radiation treatment. The patient will return to radiation oncology clinic for routine followup in one month. I advised the patient to call or return sooner if they have any questions or concerns related to their recovery or treatment. ________________________________  Jodelle Gross, M.D., Ph.D.

## 2014-03-31 NOTE — Addendum Note (Signed)
Encounter addended by: Marye Round, MD on: 03/31/2014  1:44 PM<BR>     Documentation filed: Notes Section, Problem List

## 2014-03-31 NOTE — Progress Notes (Addendum)
  Radiation Oncology         [phone] ________________________________  Name: Stephanie Oliver MRN: [id number]  Date: 02/28/2014  DOB: [birth year]  SIMULATION AND TREATMENT PLANNING NOTE  DIAGNOSIS:  Metastatic lung cancer  Site:  sacrum  NARRATIVE:  The patient was brought to the [facility].  Identity was confirmed.  All relevant records and images related to the planned course of therapy were reviewed.   Written consent to proceed with treatment was confirmed which was freely given after reviewing the details related to the planned course of therapy had been reviewed with the patient.  Then, the patient was set-up in a stable reproducible  supine position for radiation therapy.  CT images were obtained.  Surface markings were placed.    Medically necessary complex treatment device(s) for immobilization:  Customized vac-lock bag.   The CT images were loaded into the planning software.  Then the target and avoidance structures were contoured.  Treatment planning then occurred.  The radiation prescription was entered and confirmed.  A total of 4 complex treatment devices were fabricated which relate to the designed radiation treatment fields. Each of these customized fields/ complex treatment devices will be used on a daily basis during the radiation course. I have requested : 3D Simulation  I have requested a DVH of the following structures: target volume, bowel, cord/cauda.    The patient will undergo daily image guidance to ensure accurate localization of the target, and adequate minimize dose to the normal surrounding structures in close proximity to the target, especially important given the hypofractionated treatment planned.   PLAN:  The patient will receive 20 Gy in 5 fractions.  ________________________________   Jodelle Gross, MD, PhD

## 2014-04-17 NOTE — ED Provider Notes (Signed)
CSN: [id number]     Arrival date & time 03-27-14  0620 History   First MD Initiated Contact with Patient 03-27-2014 [phone]     Chief Complaint  Patient presents with  . Altered Mental Status     (Consider location/radiation/quality/duration/timing/severity/associated sxs/prior Treatment) Patient is a 63 y.o. female presenting with altered mental status. The history is provided by the EMS personnel. The history is limited by the condition of the patient (minimally responsive).  Altered Mental Status Presenting symptoms: unresponsiveness   Severity:  Severe Most recent episode:  Today Episode history:  Single Timing:  Constant Progression:  Unchanged Chronicity:  New Context: nursing home resident   Last seen normal at 230 am and then found unresponsive at 6 am.  Stage 4 non small cell CA  Past Medical History  Diagnosis Date  . Depression   . Panic attacks   . Hypertension   . Smoker   . Alcohol abuse   . History of radiation therapy 07/28/13-08/25/13    rt lung,37.5Gy/23fx  . Primary cancer of right lung    Past Surgical History  Procedure Laterality Date  . Tonsillectomy    . Back surgery    . Femur im nail Right 06/22/2013    Procedure: RIGHT HIP GAMMA NAIL FIXATION   ;  Surgeon: Renette Butters, MD;  Location: [facility];  Service: Orthopedics;  Laterality: Right;  . Video bronchoscopy with endobronchial ultrasound N/A 06/22/2013    Procedure: VIDEO BRONCHOSCOPY WITH ENDOBRONCHIAL ULTRASOUND;  Surgeon: Doree Fudge, MD;  Location: [facility];  Service: Pulmonary;  Laterality: N/A;   Family History  Problem Relation Age of Onset  . CAD Father   . Arthritis Mother    History  Substance Use Topics  . Smoking status: Former Smoker -- 1.50 packs/day    Types: Cigarettes    Quit date: 06/18/2013  . Smokeless tobacco: Never Used  . Alcohol Use: 2.4 oz/week    4 Shots of liquor per week     Comment: patient quit drinking 06/18/2013   OB History   Grav Para Term Preterm  Abortions TAB SAB Ect Mult Living                 Review of Systems  Unable to perform ROS     Allergies  Paxil; Lisinopril; and Penicillins  Home Medications   Prior to Admission medications   Medication Sig Start Date End Date Taking? Authorizing Provider  acetaminophen (TYLENOL) 325 MG tablet Take 2 tablets (650 mg total) by mouth every 6 (six) hours as needed for mild pain (or Fever >/= 101). 03/01/14   Christina P Rama, MD  albuterol (PROVENTIL HFA;VENTOLIN HFA) 108 (90 BASE) MCG/ACT inhaler Inhale 2 puffs into the lungs 2 (two) times daily as needed.     Historical Provider, MD  ALPRAZolam Duanne Moron) 0.5 MG tablet Take 1 tablet (0.5 mg total) by mouth 2 (two) times daily as needed for anxiety. 03/01/14   Venetia Maxon Rama, MD  amiodarone (PACERONE) 200 MG tablet Take 1 tablet (200 mg total) by mouth daily. 08/20/13   Birdie Riddle, MD  apixaban (ELIQUIS) 5 MG TABS tablet Take 1 tablet (5 mg total) by mouth 2 (two) times daily. 07/13/13   Bary Leriche, PA-C  Calcium Carbonate (CALTRATE 600 PO) Take 1 tablet by mouth daily.    Historical Provider, MD  cholecalciferol (VITAMIN D) 1000 UNITS tablet Take 1,000 Units by mouth daily.    Historical Provider, MD  dexamethasone (DECADRON) 2  MG tablet Take 1 tablet (2 mg total) by mouth every 12 (twelve) hours. 03/01/14   Venetia Maxon Rama, MD  digoxin (LANOXIN) 0.125 MG tablet Take 1 tablet (0.125 mg total) by mouth daily. 08/15/13   Birdie Riddle, MD  diltiazem (CARDIZEM CD) 180 MG 24 hr capsule Take 180 mg by mouth daily.    Historical Provider, MD  diphenhydrAMINE (BENADRYL) 25 MG tablet Take 25 mg by mouth at bedtime.    Historical Provider, MD  feeding supplement, ENSURE COMPLETE, (ENSURE COMPLETE) LIQD Take 237 mLs by mouth 2 (two) times daily between meals. 06/29/13   Melton Alar, PA-C  folic acid (FOLVITE) 1 MG tablet Take 1 tablet (1 mg total) by mouth daily. 07/13/13   Bary Leriche, PA-C  gabapentin (NEURONTIN) 100 MG capsule Take 1  capsule (100 mg total) by mouth 3 (three) times daily. 02/03/14   Curt Bears, MD  iron polysaccharides (NIFEREX) 150 MG capsule Take 1 capsule (150 mg total) by mouth daily. 07/13/13   Bary Leriche, PA-C  levalbuterol (XOPENEX) 0.63 MG/3ML nebulizer solution Take 0.63 mg by nebulization every 4 (four) hours as needed for wheezing or shortness of breath.    Historical Provider, MD  levothyroxine (SYNTHROID, LEVOTHROID) 25 MCG tablet Take 25 mcg by mouth daily before breakfast.    Historical Provider, MD  lidocaine (LIDODERM) 5 % Place 1 patch onto the skin daily. Remove & Discard patch within 12 hours or as directed by MD 03/01/14   Venetia Maxon Rama, MD  Multiple Vitamin (MULTIVITAMIN WITH MINERALS) TABS tablet Take 3 tablets by mouth daily.    Historical Provider, MD  ondansetron (ZOFRAN) 4 MG tablet Take 1 tablet (4 mg total) by mouth every 6 (six) hours as needed for nausea. 03/01/14   Venetia Maxon Rama, MD  oxyCODONE (OXY IR/ROXICODONE) 5 MG immediate release tablet Take one tablet by mouth every 6 hours as needed for pain 03/03/14   Tiffany L Reed, DO  pantoprazole (PROTONIX) 40 MG tablet Take 40 mg by mouth daily.    Historical Provider, MD  polyethylene glycol (MIRALAX / GLYCOLAX) packet Take 17 g by mouth daily.    Historical Provider, MD  pregabalin (LYRICA) 25 MG capsule Take 1 capsule (25 mg total) by mouth at bedtime. 03/03/14   Tiffany L Reed, DO  thiamine 100 MG tablet Take 1 tablet (100 mg total) by mouth daily. 07/13/13   Ivan Anchors Love, PA-C   SpO2 99% Physical Exam  Constitutional: She appears lethargic. She appears cachectic.  HENT:  Head: Normocephalic and atraumatic.  Right Ear: External ear normal.  Left Ear: External ear normal.  Eyes: Conjunctivae are normal.  Neck: Normal range of motion. Neck supple.  Cardiovascular: Normal rate, regular rhythm and intact distal pulses.   Pulmonary/Chest: No stridor. Tachypnea noted. No respiratory distress. She has decreased breath  sounds.  Abdominal: Soft. Bowel sounds are normal. There is no tenderness.  Lymphadenopathy:    She has no cervical adenopathy.  Neurological: She appears lethargic.  Skin: Skin is warm and dry.    ED Course  Procedures (including critical care time) Labs Review Labs Reviewed  CBC WITH DIFFERENTIAL  COMPREHENSIVE METABOLIC PANEL  LACTIC ACID, PLASMA  URINALYSIS, ROUTINE W REFLEX MICROSCOPIC  TROPONIN I  AMMONIA    Imaging Review No results found.   EKG Interpretation None      MDM   Final diagnoses:  None   630 with Sharrie Rothman, nurse on phone with EDP discussed with  patient's brother POA rescusitation.  Sister in [employer] and brother [facility]) do not want CPR nor do they want intubation but they do want medications and fluid rescus.    642 Case d/w Dr. Alcario Drought.  Will need to be placed on palliative care service.  Please hold in the ED for the morning hospitalist to come down for admission  647 am Terri, charge nurse informed EDP that they have discussed plan with POA, family do not want patient to suffer and to make comfort care  Anikin Prosser K Donyale Berthold-Rasch, MD 04/11/2014 [phone]

## 2014-04-17 NOTE — Progress Notes (Signed)
UR completed 

## 2014-04-17 NOTE — Discharge Summary (Signed)
Brief Death Summary  Date of death 03-29-2014 Time of death 39: 81 Cause of death: -Acute respiratory failure -Pneumonia  -Metastatic lung disease   Hospital course: Patient was admitted earlier during the day secondary to acute respiratory failure, unresponsiveness, was found to have left lower lobe pneumonia, in the setting of metastatic lung disease, patient was made comfort care but the family, but it is careful obstructed, morphine drip was initiated, and comfort care protocol was initiated, family presented from [place] to see the family, and they were present at time of this.   Phillips Climes MD [phone]

## 2014-04-17 NOTE — ED Notes (Signed)
Spiritual Care following.  No family present.  Pt not responsive.  Will continue to follow for end of life support.  Please page as family arrives / needs arise.    [place], [place]

## 2014-04-17 NOTE — ED Notes (Signed)
Contacted pt's HCPOA Mr. Ohlson and explained to them that the pt is in obvious distress and d/w them the concept of comfort care.  Mr. Constancio said, in the presence of Nilsa Nutting, RN that he would like his sister to be comfortable and is in agreement w/ the initiation of a morphine gtt to alleviate pt's air hunger.  Please call Mr. Kittle @ [phone] if the pt dies prior to his arrival.

## 2014-04-17 NOTE — Progress Notes (Signed)
Chaplain provided support with daughter Tonette Bihari), "adopted son" Roswell Miners) and friend at bedside.  Family is coping well.  Awaiting arrival of other family members from [place] 6 hours.   Family expressed pt's humor and care for them even in her own illness.  Will continue to follow for support.  Please page as needs arise.   [place], [place]

## 2014-04-17 NOTE — Consult Note (Signed)
Patient Stephanie Oliver      DOB: 1950-06-22      GQQ:[id number]     Consult Note from the Palliative Medicine Team at [facility] Requested by: Dr. Waldron Labs    PCP: Stephanie Naas, MD Reason for Consultation:Comfort care     Phone Number:[phone]  Assessment of patients Current state: 63 yr old white female with metastatic stage IV lung cancer. Patient had just completed radiation to leg and back and was entertaining more chemo if she was doing ok.  Patient celebrated her birthday yesterday with her daughter and later in the night was found unresponsive and cool to touch.  Patient found to have left lower lobe pneumonia with obvious sirs/sepsis.  Family elected comfort care with no CPR. Currently Brother, Stephanie Oliver,  who is POA is on the way from [place] with Sister In Stephanie Oliver.  Patient appears to be very near death may not hold out for their arrival. Daughter Stephanie Oliver at the bedside updated on prognosis.   Goals of Care: 1.  Code Status: DNR , comfort care.   2. Scope of Treatment: 1. Dyspnea: managed with Morphine drip at 1 mg/hr and prn boluses. 2.  Pain: will be controlled with drip 3. Terminal secretions: use atropine and scopolamine 4. Anxiety: prn ativan.    4. Disposition: Hospital death likely soon.   3. Symptom Management:  See above.  4. Psychosocial: one daughter with strained relationship, but Stephanie Oliver is here now and we talked about staying in the moment.  Stephanie Oliver is 2, offered support through hospice she needed for Stephanie Oliver (he may not meet criteria for [facility] but could check)  5. Spiritual: Chaplain involved.        Patient Documents Completed or Given: Document Given Completed  Advanced Directives Pkt    MOST    DNR    Gone from My Sight    Hard Choices      Brief HPI: 63 yr old white female known to me from her last admission. She had been battling Metastatic lung cancer . Just completed radiation.  We have been asked to assist  with symptom management and support at the end of life.  ROS: unable     PMH:  Past Medical History  Diagnosis Date  . Depression   . Panic attacks   . Hypertension   . Smoker   . Alcohol abuse   . History of radiation therapy 07/28/13-08/25/13    rt lung,37.5Gy/17fx  . Primary cancer of right lung      PSH: Past Surgical History  Procedure Laterality Date  . Tonsillectomy    . Back surgery    . Femur im nail Right 06/22/2013    Procedure: RIGHT HIP GAMMA NAIL FIXATION   ;  Surgeon: Renette Butters, MD;  Location: [facility];  Service: Orthopedics;  Laterality: Right;  . Video bronchoscopy with endobronchial ultrasound N/A 06/22/2013    Procedure: VIDEO BRONCHOSCOPY WITH ENDOBRONCHIAL ULTRASOUND;  Surgeon: Doree Fudge, MD;  Location: [facility];  Service: Pulmonary;  Laterality: N/A;   I have reviewed the [place] and SH and  If appropriate update it with new information. Allergies  Allergen Reactions  . Paxil [Paroxetine] Diarrhea, Nausea Only and Other (See Comments)    Also dizziness  . Lisinopril Diarrhea and Nausea Only  . Penicillins Hives   Scheduled Meds: . scopolamine  1 patch Transdermal Q72H   Continuous Infusions: . morphine 1 mg/hr (March 25, 2014 0817)   PRN Meds:.acetaminophen, acetaminophen,  atropine, diphenhydrAMINE, LORazepam, morphine injection, morphine CONCENTRATE, morphine CONCENTRATE, ondansetron (ZOFRAN) IV, ondansetron, polyvinyl alcohol    BP 138/82  Pulse 89  Temp(Src) 95.1 F (35.1 C) (Rectal)  Resp 29  SpO2 99%   PPS 5%  No intake or output data in the 24 hours ending 04-05-14 1332   Physical Exam:  General: Unresponsive to tactile and verbal stimuli, cool to touch, sippy breath sounds HEENT:  Pupils minimally reactive can't assess EOMI,   Chest:   Decreased with poor air entry CVS: distant regular S1, S2 Abdomen: scaphoid, soft , no grimace Ext: cold Neuro: unresponsive  Labs: CBC    Component Value Date/Time   WBC 6.9  2014-04-05 0655   WBC 10.8* 03/15/2014 1100   RBC 3.54* Apr 05, 2014 0655   RBC 3.55* 03/15/2014 1100   HGB 10.9* April 05, 2014 0655   HGB 10.9* 03/15/2014 1100   HCT 35.5* 04/05/14 0655   HCT 33.7* 03/15/2014 1100   PLT 115* 2014-04-05 0655   PLT 172 03/15/2014 1100   MCV 100.3* 2014-04-05 0655   MCV 95.1 03/15/2014 1100   MCH 30.8 2014-04-05 0655   MCH 30.6 03/15/2014 1100   MCHC 30.7 04-05-2014 0655   MCHC 32.2 03/15/2014 1100   RDW 18.2* 04/05/14 0655   RDW 18.7* 03/15/2014 1100   LYMPHSABS 0.3* 2014-04-05 0655   LYMPHSABS 0.4* 03/15/2014 1100   MONOABS 0.7 Apr 05, 2014 0655   MONOABS 0.6 03/15/2014 1100   EOSABS 0.0 05-Apr-2014 0655   EOSABS 0.0 03/15/2014 1100   BASOSABS 0.0 April 05, 2014 0655   BASOSABS 0.0 03/15/2014 1100      CMP     Component Value Date/Time   NA 138 05-Apr-2014 0655   NA 131* 03/15/2014 1100   K 5.0 April 05, 2014 0655   K 4.4 03/15/2014 1100   CL 95* April 05, 2014 0655   CO2 37* 04/05/14 0655   CO2 33* 03/15/2014 1100   GLUCOSE 123* 2014-04-05 0655   GLUCOSE 121 03/15/2014 1100   BUN 33* Apr 05, 2014 0655   BUN 24.2 03/15/2014 1100   CREATININE 0.21* 2014-04-05 0655   CREATININE 0.5* 03/15/2014 1100   CALCIUM 8.0* 2014-04-05 0655   CALCIUM 8.5 03/15/2014 1100   PROT 5.9* 04/05/2014 0655   PROT 5.7* 03/15/2014 1100   ALBUMIN 2.5* 04-05-14 0655   ALBUMIN 2.5* 03/15/2014 1100   AST 22 Apr 05, 2014 0655   AST 23 03/15/2014 1100   ALT 50* 04-05-14 0655   ALT 57* 03/15/2014 1100   ALKPHOS 68 05-Apr-2014 0655   ALKPHOS 79 03/15/2014 1100   BILITOT 0.2* 04/05/14 0655   BILITOT 0.23 03/15/2014 1100   GFRNONAA >90 04/05/14 0655   GFRAA >90 2014-04-05 0655    Chest Xray Reviewed/Impressions: Left lower lobe pneumonia   Time In Time Out Total Time Spent with Patient Total Overall Time  100 pm 130 pm 30 min 30 min    Greater than 50%  of this time was spent counseling and coordinating care related to the above assessment and plan.  Stephanie Oliver L. Stephanie Le, MD MBA The Palliative Medicine Team at  [facility] Phone: [phone] Pager: [phone] ( Use team phone after hours)

## 2014-04-17 NOTE — Progress Notes (Signed)
Family at bedside,called into pt's room. Stephanie Oliver found without a pulse and without respirations. Dr. Waldron Labs notified. [facility] donor services notified. [employer] will be used.

## 2014-04-17 NOTE — Progress Notes (Signed)
Wasted 80cc of morphine (1mg /mL) drip in the sink. Witnessed by Mardi Mainland, RN

## 2014-04-17 NOTE — ED Notes (Signed)
Bed: RESB Expected date:  Expected time:  Means of arrival:  Comments: EMS/unresponsive

## 2014-04-17 NOTE — ED Notes (Signed)
Spoke w/ pt's HCPOA, Stephanie Oliver in the presence of Dr. Randal Buba over the phone and informed them of pt's condition at this time.  Informed them that pt is unresponsive and that the need for heroic measures such as CPR, intubation and medications to keep pt alive may be necessary.  Stephanie Oliver, HCPOA does NOT want CPR or intubation preformed on his sister.  He did request labs, fluids and antibiotics.  Stephanie Oliver and his wife are on the way from [place] and can be reached at [phone].

## 2014-04-17 NOTE — Progress Notes (Signed)
Family at bedside. Pt unresponsive. Repositioned to left. Resp-24

## 2014-04-17 NOTE — Plan of Care (Signed)
Problem: Phase I Progression Outcomes Goal: Psychosocial & spiritual needs assessed Outcome: Progressing Family at bedside. Chaplain in to see the family

## 2014-04-17 NOTE — ED Notes (Signed)
Per EMS pt was last seen at her baseline at 0230.  At [place] staff at rehab facility found pt unresponsive w/ shallow inspirations.  VS stable.  Will respond to painful stimuli.

## 2014-04-17 NOTE — Progress Notes (Addendum)
Daughter at bedside. Temp- 97.6 aux. Pt turned and repositioned off RT side

## 2014-04-17 NOTE — Plan of Care (Signed)
Problem: Phase I Progression Outcomes Goal: Respirations unlabored Outcome: Progressing Periods of apnea

## 2014-04-17 NOTE — H&P (Signed)
Patient Demographics  Stephanie Oliver, is a 63 y.o. female  MRN: [id number]   DOB - 1951-02-27  Admit Date - 27-Mar-2014  Outpatient Primary MD for the patient is [name], MD   With History of -  Past Medical History  Diagnosis Date  . Depression   . Panic attacks   . Hypertension   . Smoker   . Alcohol abuse   . History of radiation therapy 07/28/13-08/25/13    rt lung,37.5Gy/69fx  . Primary cancer of right lung       Past Surgical History  Procedure Laterality Date  . Tonsillectomy    . Back surgery    . Femur im nail Right 06/22/2013    Procedure: RIGHT HIP GAMMA NAIL FIXATION   ;  Surgeon: Renette Butters, MD;  Location: [facility];  Service: Orthopedics;  Laterality: Right;  . Video bronchoscopy with endobronchial ultrasound N/A 06/22/2013    Procedure: VIDEO BRONCHOSCOPY WITH ENDOBRONCHIAL ULTRASOUND;  Surgeon: Doree Fudge, MD;  Location: [facility];  Service: Pulmonary;  Laterality: N/A;    in for   Chief Complaint  Patient presents with  . Altered Mental Status     HPI  Stephanie Oliver  is a 62 y.o. female, with known history of stage IV lung cancer with metastases to the liver, on chemoradiation with chemotherapy, patient brought from skilled nursing facility for unresponsiveness, history could not be obtained from the patient, obtained from staff, and was unresponsive upon presentation, she was given some Narcan, with no significant response, she was hypothermic, tachypneic, hypoxic, patient chest x-ray was significant for left lower lobe pneumonia, patient was critically ill, ED staff discussed level of care with the power of attorney brother over the phone, several conversation with her held with the family, and decision was made for comfort care eventually, I spoke with sister-in-law myself, and the main goal of care at this point is patient comfort, and decision was made not to start any antibiotics, or to do any further blood work or imaging, and  palliative care were consulted, as has been already seeing the patient's, and family were very pleased with their service, and they requested requested that palliative care to be following the patient as well.    Review of Systems    In addition to the HPI above,  Patient is unresponsive, unable to provide any review of system.   Social History History  Substance Use Topics  . Smoking status: Former Smoker -- 1.50 packs/day    Types: Cigarettes    Quit date: 06/18/2013  . Smokeless tobacco: Never Used  . Alcohol Use: 2.4 oz/week    4 Shots of liquor per week     Comment: patient quit drinking 06/18/2013     Family History Family History  Problem Relation Age of Onset  . CAD Father   . Arthritis Mother      Prior to Admission medications   Medication Sig Start Date End Date Taking? Authorizing Provider  albuterol (PROVENTIL) (2.5 MG/3ML) 0.083% nebulizer solution Take 2.5 mg by nebulization daily after breakfast.   Yes Historical Provider, MD  ALPRAZolam (XANAX) 0.5 MG tablet Take 1 tablet (0.5 mg total) by mouth 2 (two) times daily as needed for anxiety. 03/01/14  Yes Venetia Maxon Rama, MD  amiodarone (PACERONE) 200 MG tablet Take 1 tablet (200 mg total) by mouth daily. 08/20/13  Yes Birdie Riddle, MD  apixaban (ELIQUIS) 5 MG TABS tablet Take 1 tablet (5 mg total) by  mouth 2 (two) times daily. 07/13/13  Yes Ivan Anchors Love, PA-C  Calcium Carbonate (CALTRATE 600 PO) Take 1 tablet by mouth daily.   Yes Historical Provider, MD  cholecalciferol (VITAMIN D) 1000 UNITS tablet Take 1,000 Units by mouth daily.   Yes Historical Provider, MD  dexamethasone (DECADRON) 2 MG tablet Take 1 tablet (2 mg total) by mouth every 12 (twelve) hours. 03/01/14  Yes Venetia Maxon Rama, MD  digoxin (LANOXIN) 0.125 MG tablet Take 1 tablet (0.125 mg total) by mouth daily. 08/15/13  Yes Birdie Riddle, MD  diltiazem (DILACOR XR) 180 MG 24 hr capsule Take 180 mg by mouth daily.   Yes Historical Provider, MD    diphenhydrAMINE (BENADRYL) 25 MG tablet Take 25 mg by mouth at bedtime.   Yes Historical Provider, MD  feeding supplement, ENSURE COMPLETE, (ENSURE COMPLETE) LIQD Take 237 mLs by mouth 2 (two) times daily between meals. 06/29/13  Yes Bobby Rumpf [place], PA-C  folic acid (FOLVITE) 1 MG tablet Take 1 tablet (1 mg total) by mouth daily. 07/13/13  Yes Ivan Anchors Love, PA-C  gabapentin (NEURONTIN) 100 MG capsule Take 1 capsule (100 mg total) by mouth 3 (three) times daily. 02/03/14  Yes Curt Bears, MD  iron polysaccharides (NIFEREX) 150 MG capsule Take 1 capsule (150 mg total) by mouth daily. 07/13/13  Yes Ivan Anchors Love, PA-C  levothyroxine (SYNTHROID, LEVOTHROID) 25 MCG tablet Take 25 mcg by mouth daily before breakfast.   Yes Historical Provider, MD  lidocaine (LIDODERM) 5 % Place 1 patch onto the skin daily. Remove & Discard patch within 12 hours or as directed by MD 03/01/14  Yes Venetia Maxon Rama, MD  Multiple Vitamin (MULTIVITAMIN WITH MINERALS) TABS tablet Take 3 tablets by mouth daily.   Yes Historical Provider, MD  oxyCODONE (OXY IR/ROXICODONE) 5 MG immediate release tablet Take one tablet by mouth every 6 hours as needed for pain 03/03/14  Yes Tiffany L Reed, DO  pantoprazole (PROTONIX) 40 MG tablet Take 40 mg by mouth daily.   Yes Historical Provider, MD  polyethylene glycol (MIRALAX / GLYCOLAX) packet Take 17 g by mouth daily.   Yes Historical Provider, MD  pregabalin (LYRICA) 25 MG capsule Take 1 capsule (25 mg total) by mouth at bedtime. 03/03/14  Yes Tiffany L Reed, DO  thiamine 100 MG tablet Take 1 tablet (100 mg total) by mouth daily. 07/13/13  Yes Ivan Anchors Love, PA-C  acetaminophen (TYLENOL) 325 MG tablet Take 2 tablets (650 mg total) by mouth every 6 (six) hours as needed for mild pain (or Fever >/= 101). 03/01/14   Christina P Rama, MD  albuterol (PROVENTIL HFA;VENTOLIN HFA) 108 (90 BASE) MCG/ACT inhaler Inhale 2 puffs into the lungs 2 (two) times daily as needed.     Historical Provider, MD     Allergies  Allergen Reactions  . Paxil [Paroxetine] Diarrhea, Nausea Only and Other (See Comments)    Also dizziness  . Lisinopril Diarrhea and Nausea Only  . Penicillins Hives    Physical Exam  Vitals  Blood pressure 124/64, pulse 89, temperature 95.1 F (35.1 C), temperature source Rectal, resp. rate 27, SpO2 98.00%.   1. General  frail elderly female laying in bed, unresponsive.  2. HEENT; atraumatic, has alopecia secondary to chemotherapy, the oral mucosa, pupils equally reactive to light.  3. Neck: No JVD, trachea is midline, no carotid bruits.  4. chest : Significant decrease in air entry, use of accessory muscle, rapid shallow breathing, bibasilar Rales.  5. Cardiovascular:  S1-S2 heard, no rubs murmurs gallops.  6. abdomen soft, bowel sounds present, no rebound no guarding.  7. Extremity, no edema, no clubbing, no cyanosis, warm, diminished pulses.  8. Skin: Dry, delayed skin turgor, few  Ecchymosis.  9.  Neurology; unable to evaluate appropriately giving patient altered mental status, but she is unresponsive even to painful stimuli, all her extremities it appears to be flaccid.  10. Psychiatric: Unresponsive.    Data Review  CBC  Recent Labs Lab 03/15/14 1100 April 17, 2014 0655  WBC 10.8* 6.9  HGB 10.9* 10.9*  HCT 33.7* 35.5*  PLT 172 115*  MCV 95.1 100.3*  MCH 30.6 30.8  MCHC 32.2 30.7  RDW 18.7* 18.2*  LYMPHSABS 0.4* 0.3*  MONOABS 0.6 0.7  EOSABS 0.0 0.0  BASOSABS 0.0 0.0   ------------------------------------------------------------------------------------------------------------------  Chemistries   Recent Labs Lab 03/15/14 1100 04-17-14 0655  NA 131* 138  K 4.4 5.0  CL  --  95*  CO2 33* 37*  GLUCOSE 121 123*  BUN 24.2 33*  CREATININE 0.5* 0.21*  CALCIUM 8.5 8.0*  AST 23 22  ALT 57* 50*  ALKPHOS 79 68  BILITOT 0.23 0.2*    ------------------------------------------------------------------------------------------------------------------ CrCl is unknown because both a height and weight (above a minimum accepted value) are required for this calculation. ------------------------------------------------------------------------------------------------------------------ No results found for this basename: TSH, T4TOTAL, FREET3, T3FREE, THYROIDAB,  in the last 72 hours   Coagulation profile No results found for this basename: INR, PROTIME,  in the last 168 hours ------------------------------------------------------------------------------------------------------------------- No results found for this basename: DDIMER,  in the last 72 hours -------------------------------------------------------------------------------------------------------------------  Cardiac Enzymes  Recent Labs Lab 04-17-2014 0655  TROPONINI <0.30   ------------------------------------------------------------------------------------------------------------------ No components found with this basename: POCBNP,    ---------------------------------------------------------------------------------------------------------------  Urinalysis    Component Value Date/Time   COLORURINE YELLOW 02/22/2014 1716   APPEARANCEUR TURBID* 02/22/2014 1716   LABSPEC 1.011 02/22/2014 1716   PHURINE 7.5 02/22/2014 [place] 02/22/2014 [place] 02/22/2014 [place] 02/22/2014 [place] 02/22/2014 [place] 02/22/2014 1716   UROBILINOGEN 1.0 02/22/2014 1716   NITRITE NEGATIVE 02/22/2014 [place]* 02/22/2014 1716    ----------------------------------------------------------------------------------------------------------------  Imaging results:   Dg Chest Port 1 View  Apr 17, 2014   CLINICAL DATA:  History of right-sided lung carcinoma. Currently with altered mental status  EXAM:  PORTABLE CHEST - 1 VIEW  COMPARISON:  Chest radiograph August 14, 2013; chest CT February 22, 2014  FINDINGS: There is elevation of the right hemidiaphragm. There is consolidation in the left lower lobe. Elsewhere lungs are clear. Heart size and pulmonary vascularity are normal. No adenopathy. There is an apparent skin fold on the right. No pneumothorax appreciable. No bone lesions. There is atherosclerotic change in the aorta.  IMPRESSION: Left lower lobe consolidation. Elevation right hemidiaphragm, stable.   Electronically Signed   By: Lowella Grip M.D.   On: 04-17-14 07:01        Assessment & Plan  Principal Problem:   Acute respiratory failure Active Problems:   Encephalopathy   COPD (chronic obstructive pulmonary disease)   Non-small cell cancer of right lung   Hypothermia    Acute respiratory failure In the setting of healthcare acquired pneumonia, as evident on the chest x-ray, with poor baseline respiratory status secondary to COPD and lung cancer.  Encephalopathic Most likely metabolic encephalopathy giving her sepsis, but in the same time can't rule out any intracranial pathology  or metastasis or bleed (especially she is on Eliquis)  COPD/non-small cell carcinoma of the lung/hypothermia.  Plan: -Discussed with the family, including her POA who is her brother, I discussed this personally with his wife as well, at this point given patient's overall poor prognosis and poor quality of life anticipated, and they do not want to prolong her suffering, so they do not wish to pursue any active treatment measure at this point beside comfort care, so will consult palliative care service, patient will be admitted for comfort care measures, will start her on morphine drip, when necessary morphine and Ativan, scopolamine patch, atropine eyedrops. - Family are driving from [place], and they would like to be updated if there is any major interventions on the phone number  [phone].    DVT Prophylaxis none    Family Communication: Admission, patients condition and plan of care including tests being ordered have been discussed with family (brothers wife) who indicate understanding and agree with the plan and Code Status.  Code Status comfort care  Likely DC to  patient's appears to be terminal, and likely will pass away within the next 24 hours  Condition Terminal  Time spent in minutes : 55 minutes    Kaikoa Magro M.D on 04/14/14 at 7:50 AM  Between 7am to 7pm - Pager - [phone]  After 7pm go to [email or web address] - password [email or web address]  And look for the night coverage person covering me after hours  [facility] Office  [phone]   **Disclaimer: This note may have been dictated with voice recognition software. Similar sounding words can inadvertently be transcribed and this note may contain transcription errors which may not have been corrected upon publication of note.**

## 2014-04-17 DEATH — deceased

## 2015-09-01 ENCOUNTER — Other Ambulatory Visit: Payer: Self-pay | Admitting: Nurse Practitioner

## 2015-09-04 IMAGING — CT CT ANGIO CHEST
2 of 8 series · 19 of 46 positions shown · IV contrast (APPLIED)
Comparison: CT chest June 21, 2013

CLINICAL DATA: Known right lung mass.  Atrial fibrillation.

EXAM:
CT ANGIOGRAPHY CHEST WITH CONTRAST
TECHNIQUE: Multidetector CT imaging of the chest was performed using the
standard protocol during bolus administration of intravenous
contrast. Multiplanar CT image reconstructions and MIPs were
obtained to evaluate the vascular anatomy.
CONTRAST:  100mL OMNIPAQUE IOHEXOL 350 MG/ML SOLN

[Series 6: thins · axial · 0.59mm/px · z∈[-310,-71]mm · 16 of 263 slices shown]
[im 12/263  lung]
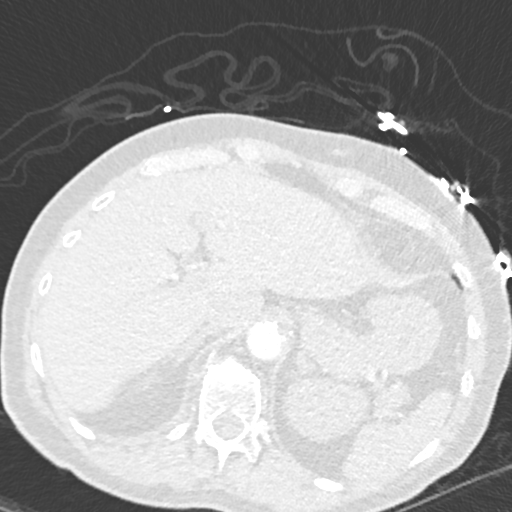
[im 24/263  soft-tissue]
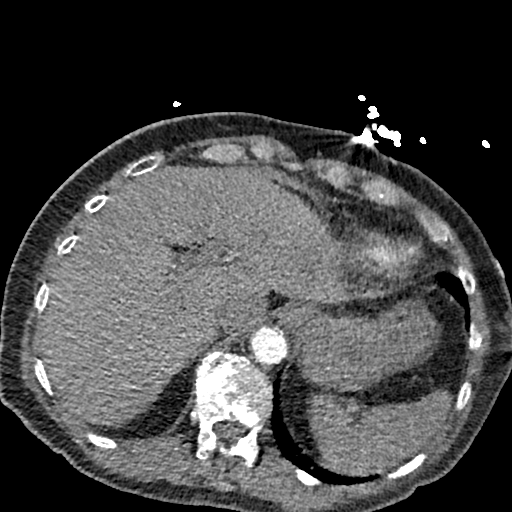
[im 48/263  lung]
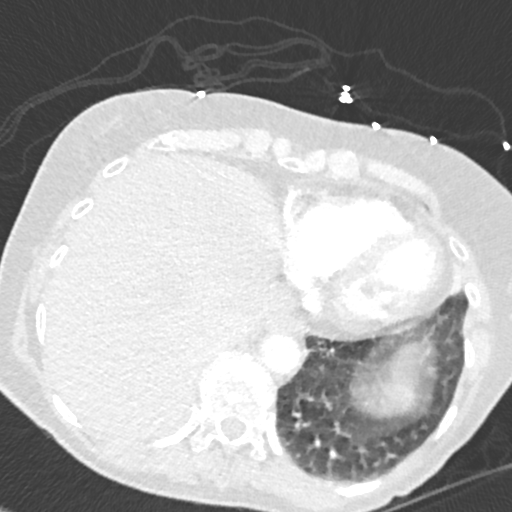
[im 60/263  soft-tissue]
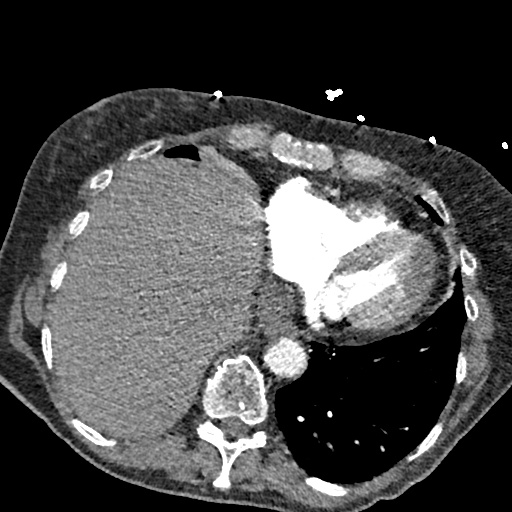
[im 72/263  lung]
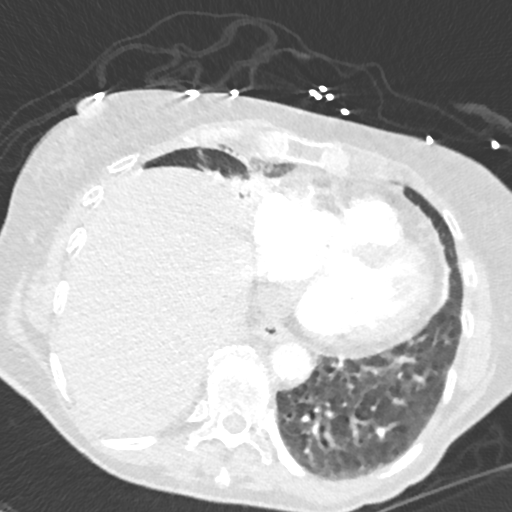
[im 96/263  soft-tissue]
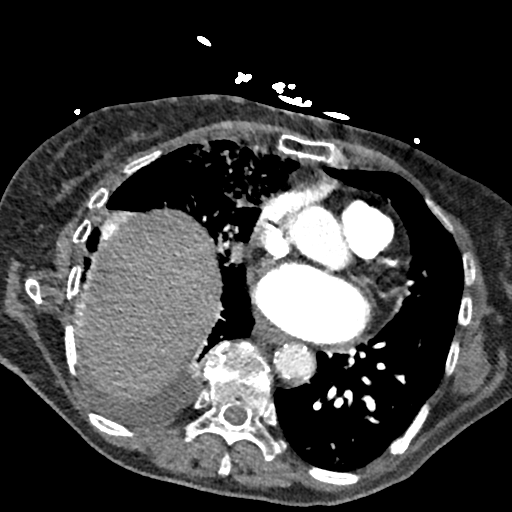
[im 108/263  lung]
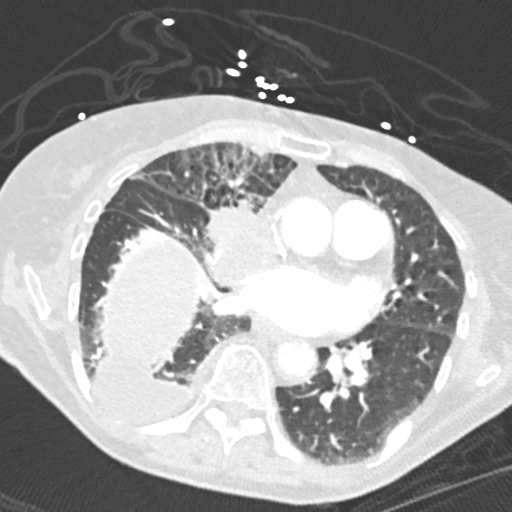
[im 120/263  soft-tissue]
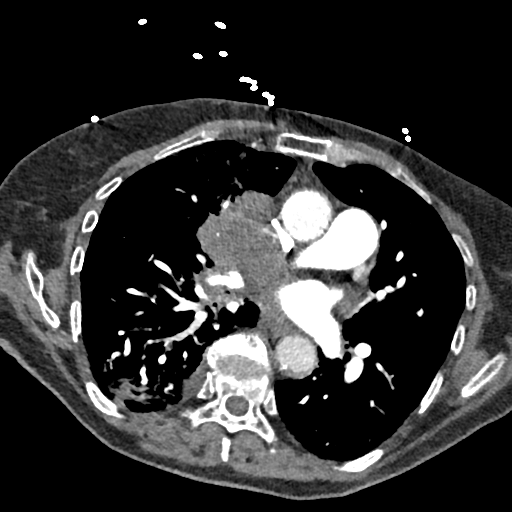
[im 143/263  lung]
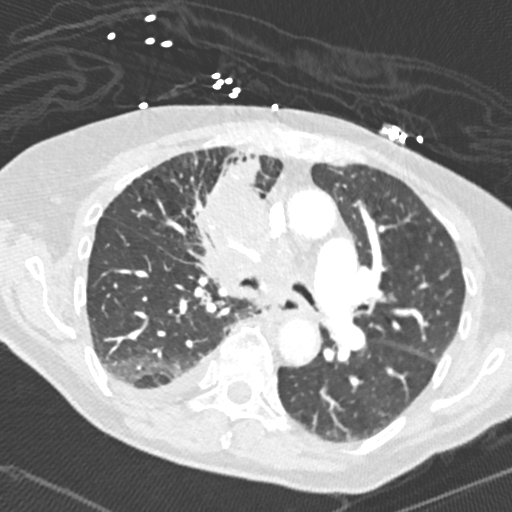
[im 155/263  soft-tissue]
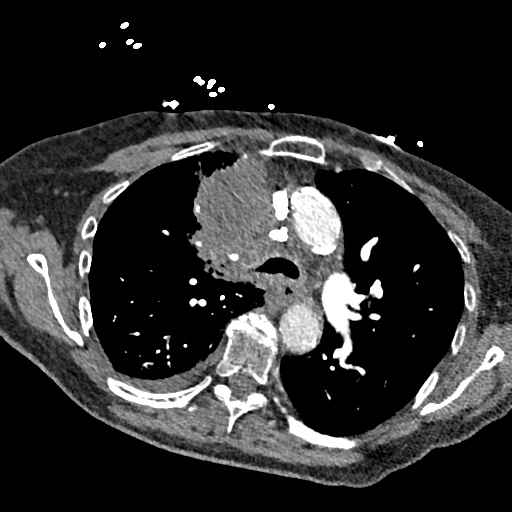
[im 167/263  lung]
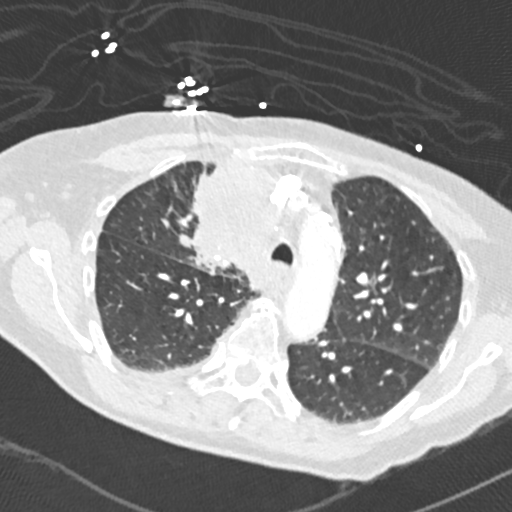
[im 191/263  soft-tissue]
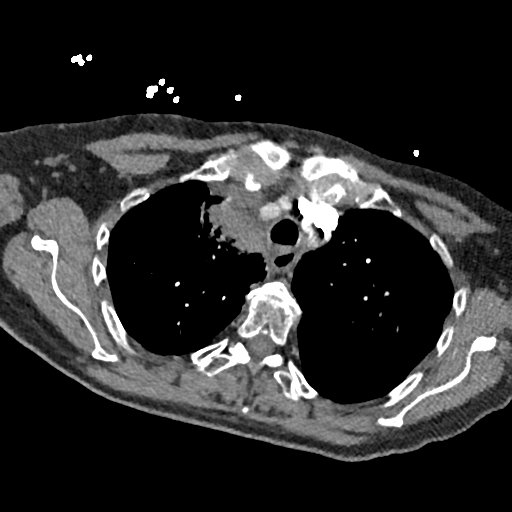
[im 203/263  lung]
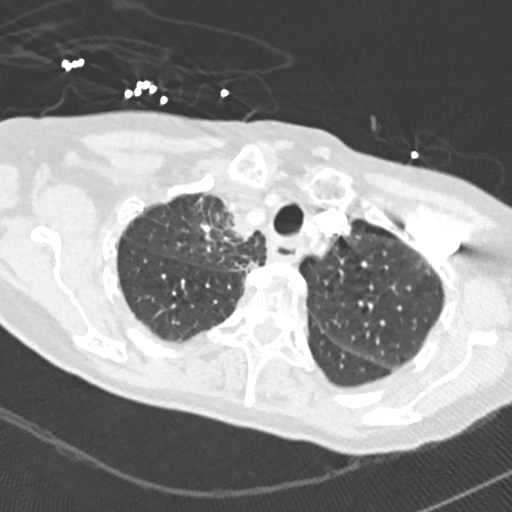
[im 215/263  soft-tissue]
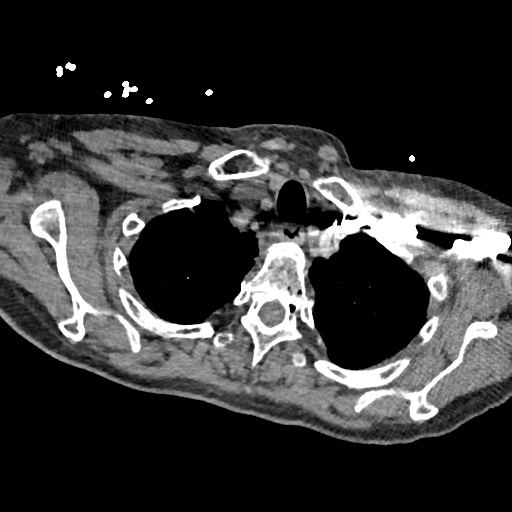
[im 239/263  lung]
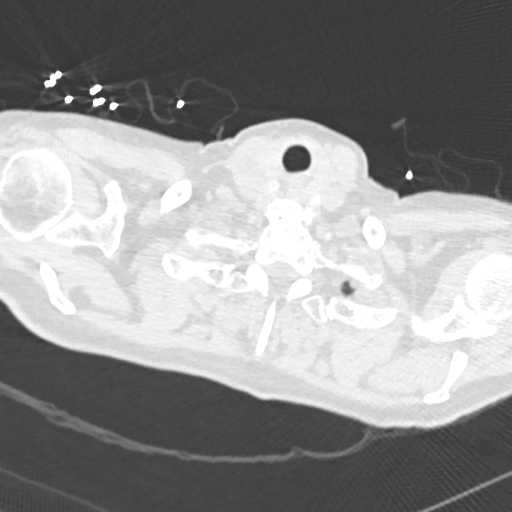
[im 251/263  soft-tissue]
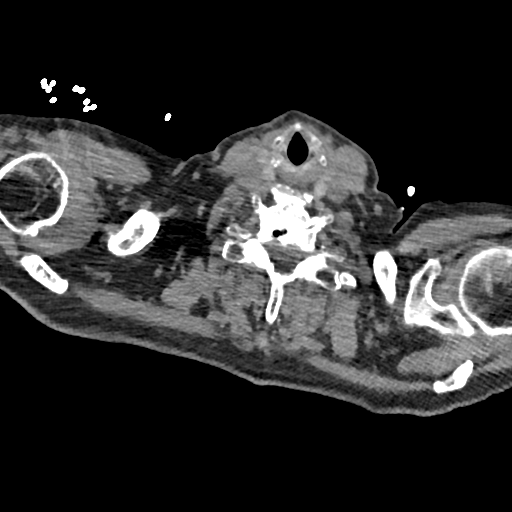

[Series 8: coronal mpr · coronal · 0.59mm/px · 3 of 106 slices shown]
[im 27/106  soft-tissue]
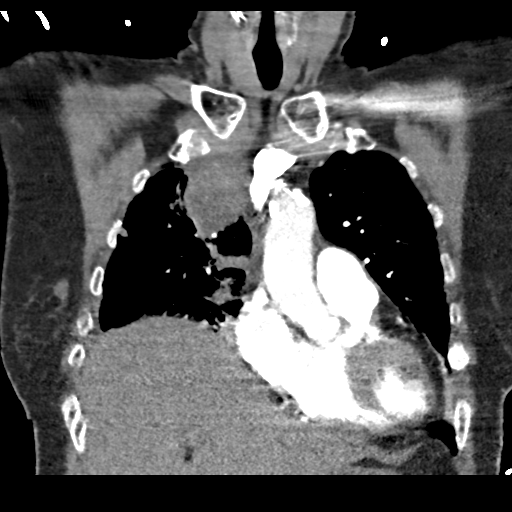
[im 53/106  soft-tissue]
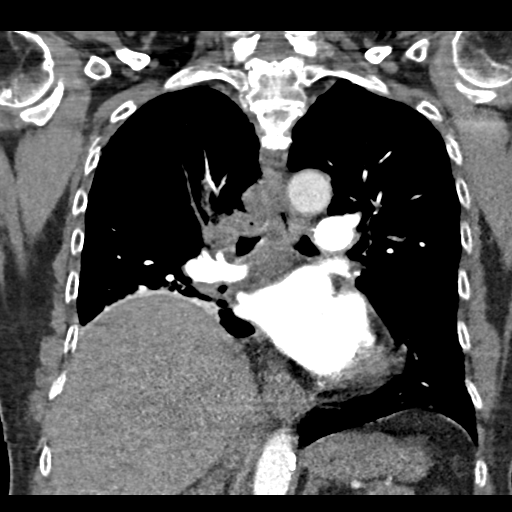
[im 79/106  soft-tissue]
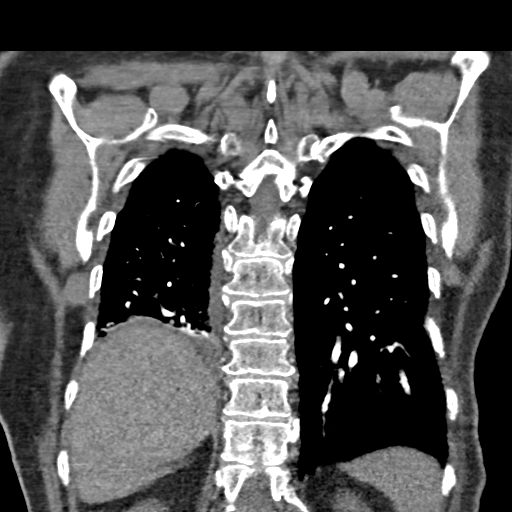

[19 of 46 positions shown; findings below may reference images not displayed]

FINDINGS: There is a heterogeneous mass measuring 7 0.1 x 5 x 6.4 cm in the
immediate aspect of the right upper lobe encasing the right upper
lobe pulmonary artery with mass effect on adjacent superior vena
cava unchanged compared to prior exam. There are several additional
nodules within the right upper lobe which are unchanged compared to
prior exam. There is interval developed patchy consolidation of the
inferior aspect of the right upper lobe probably postobstructive
pneumonitis. There is interval developed consolidation of the
posterior right lower lobe with associated small right pleural
effusion. The previously noted large necrotic right hilar lymph node
with mass effect on the adjacent right pulmonary artery and its
branches is unchanged compared to prior exam. The left lung is
clear. There is no pulmonary embolus. The visualized upper abdominal
structures are unremarkable. Degenerative joint changes of the spine
are identified.

Review of the MIP images confirms the above findings.
IMPRESSION: No pulmonary embolus.

Previously noted right lung mass and nodules as well as metastatic
lymph nodes are unchanged.

There is new consolidation of the inferior right upper lobe probably
secondary to postobstructive pneumonitis.

Posterior right lower lobe pneumonia with adjacent small right
pleural effusion.

## 2015-09-07 IMAGING — CR DG CHEST 2V
2 series · 2 of 2 positions shown · non-contrast
Comparison: CT ANGIO CHEST W/CM &/OR WO/CM dated 08/11/2013; DG
CHEST 2 VIEW dated 06/27/2013; CT CHEST W/CM dated 06/21/2013

CLINICAL DATA: Right lower lung pneumonia

EXAM:
CHEST  2 VIEW

[w chest lat]
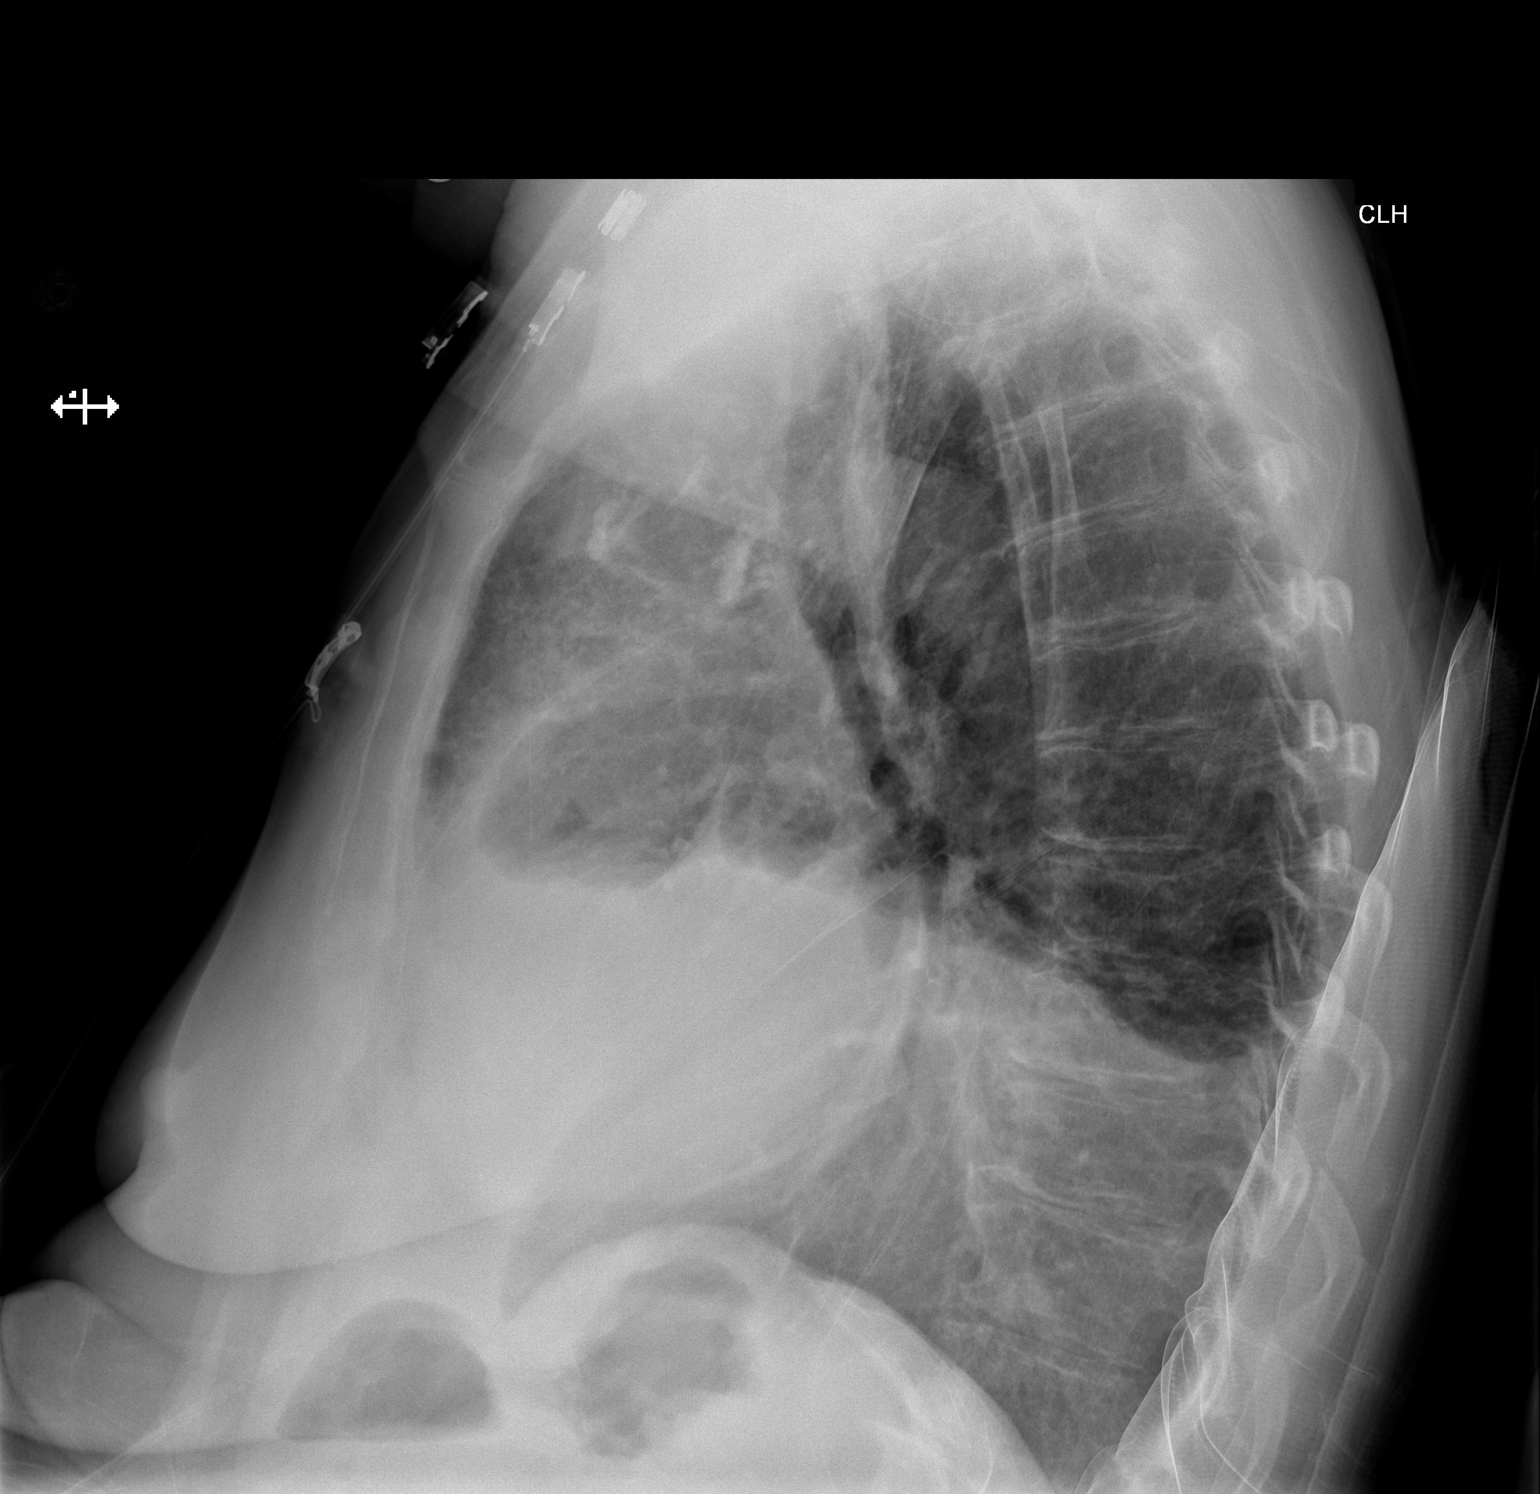

[x chest ap]
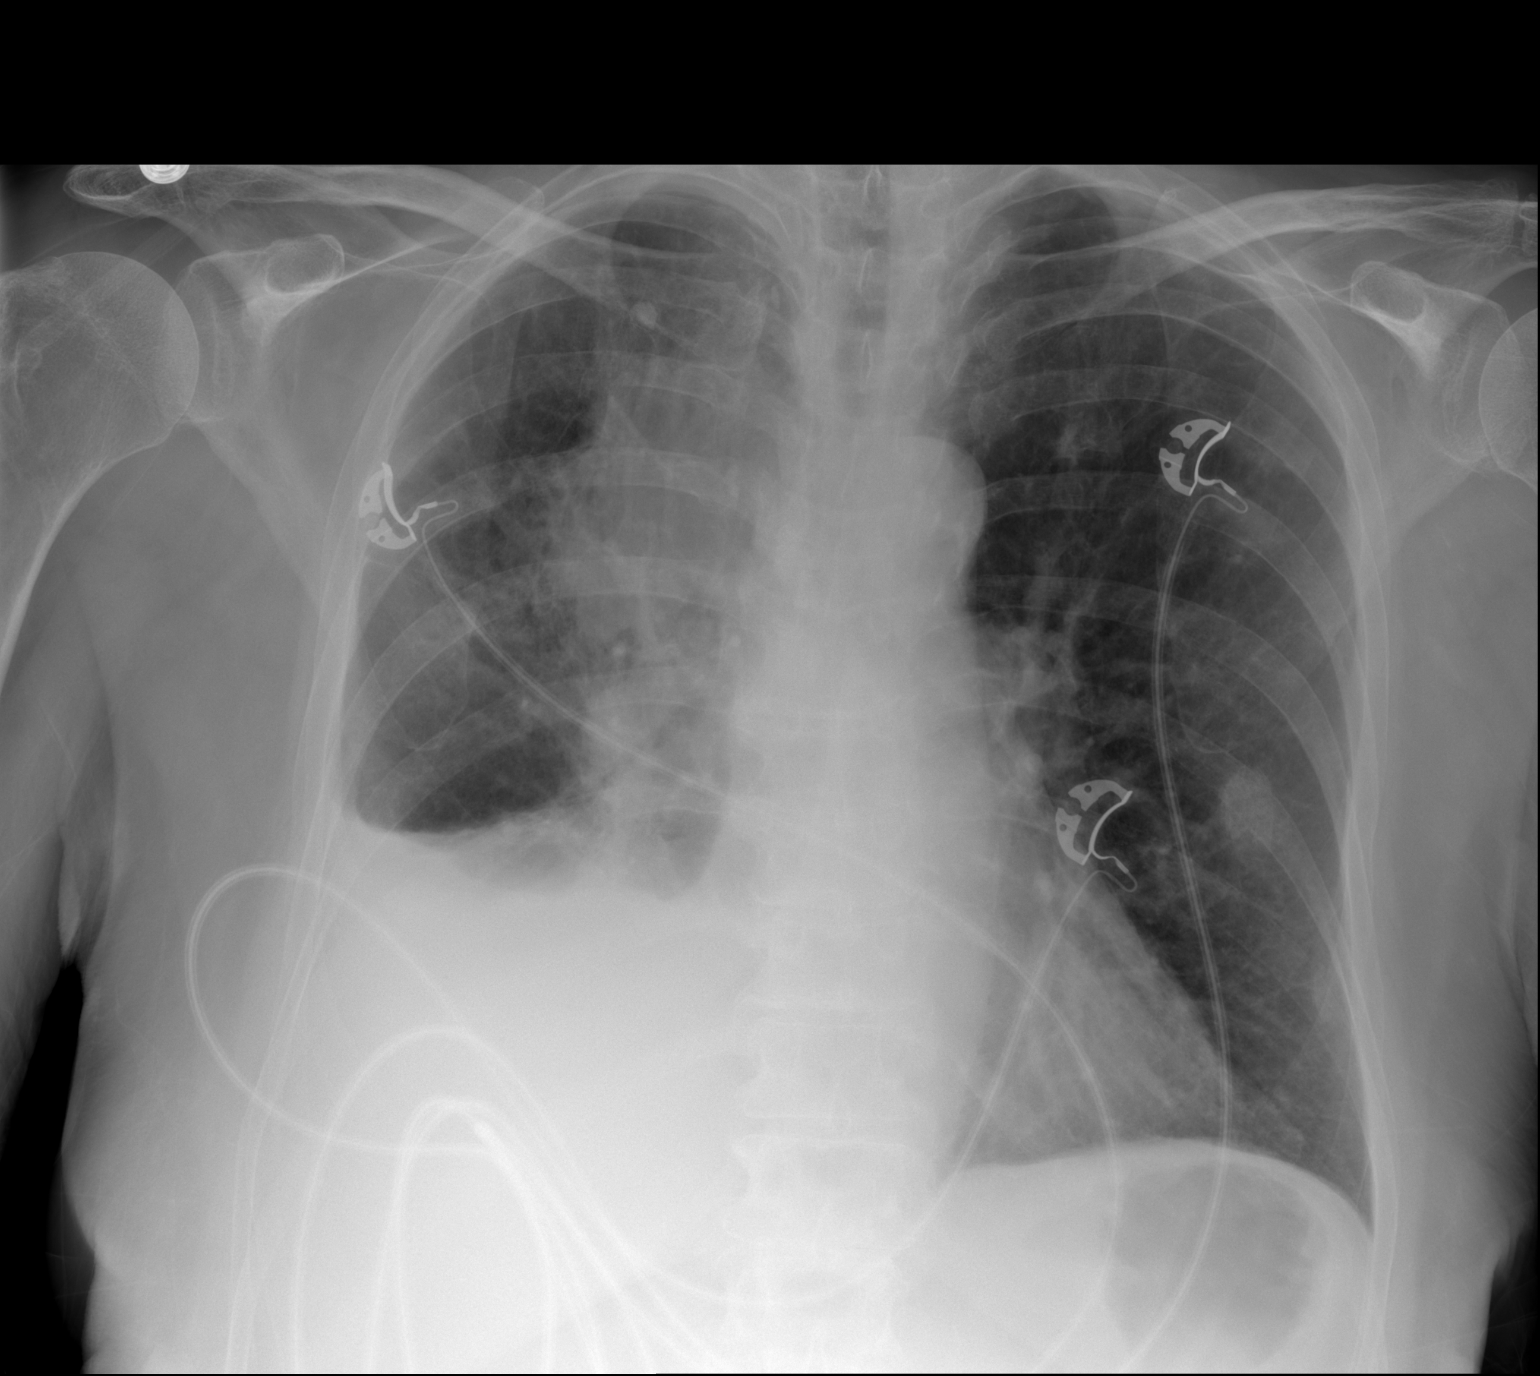

[2 of 2 positions shown; findings below may reference images not displayed]

FINDINGS: Grossly unchanged cardiac silhouette and mediastinal contours with
grossly unchanged appearance of large mass within the medial aspect
of the right upper lobe with associated volume loss. Interval
increase and small to moderate size right-sided pleural effusion
with associated worsening right basilar heterogeneous/consolidative
opacities. The left hemithorax is unchanged. No evidence of edema.
Unchanged bones including old/healed left-sided rib fractures.
IMPRESSION: 1. Interval increase in small to moderate size right-sided pleural
effusion and associated right basilar opacities, likely atelectasis.
2. Grossly unchanged appearance of the known large mass within the
medial aspect of the right upper lobe with associated volume loss.
